# Patient Record
Sex: Female | Born: 1960
Health system: Southern US, Community
[De-identification: ages and names within clinical notes are randomized; demographics above are authoritative.]

## PROBLEM LIST (undated history)

## (undated) DIAGNOSIS — C801 Malignant (primary) neoplasm, unspecified: Secondary | ICD-10-CM

## (undated) DIAGNOSIS — I499 Cardiac arrhythmia, unspecified: Secondary | ICD-10-CM

## (undated) DIAGNOSIS — F32A Depression, unspecified: Secondary | ICD-10-CM

## (undated) DIAGNOSIS — K219 Gastro-esophageal reflux disease without esophagitis: Secondary | ICD-10-CM

## (undated) DIAGNOSIS — F329 Major depressive disorder, single episode, unspecified: Secondary | ICD-10-CM

## (undated) DIAGNOSIS — F4310 Post-traumatic stress disorder, unspecified: Secondary | ICD-10-CM

## (undated) DIAGNOSIS — I10 Essential (primary) hypertension: Secondary | ICD-10-CM

## (undated) DIAGNOSIS — M810 Age-related osteoporosis without current pathological fracture: Secondary | ICD-10-CM

## (undated) DIAGNOSIS — M1990 Unspecified osteoarthritis, unspecified site: Secondary | ICD-10-CM

## (undated) DIAGNOSIS — J439 Emphysema, unspecified: Secondary | ICD-10-CM

## (undated) DIAGNOSIS — F1020 Alcohol dependence, uncomplicated: Secondary | ICD-10-CM

## (undated) DIAGNOSIS — M419 Scoliosis, unspecified: Secondary | ICD-10-CM

## (undated) DIAGNOSIS — F419 Anxiety disorder, unspecified: Secondary | ICD-10-CM

## (undated) DIAGNOSIS — E119 Type 2 diabetes mellitus without complications: Secondary | ICD-10-CM

## (undated) DIAGNOSIS — G4730 Sleep apnea, unspecified: Secondary | ICD-10-CM

## (undated) DIAGNOSIS — R32 Unspecified urinary incontinence: Secondary | ICD-10-CM

## (undated) HISTORY — PX: OTHER SURGICAL HISTORY: SHX169

## (undated) HISTORY — DX: Essential (primary) hypertension: I10

## (undated) HISTORY — DX: Unspecified urinary incontinence: R32

## (undated) HISTORY — DX: Cardiac arrhythmia, unspecified: I49.9

## (undated) HISTORY — DX: Alcohol dependence, uncomplicated: F10.20

## (undated) HISTORY — PX: ABDOMINAL HYSTERECTOMY: SHX81

## (undated) HISTORY — PX: HEMORRHOID SURGERY: SHX153

## (undated) HISTORY — DX: Age-related osteoporosis without current pathological fracture: M81.0

## (undated) HISTORY — PX: COLONOSCOPY: SHX174

## (undated) HISTORY — DX: Type 2 diabetes mellitus without complications: E11.9

## (undated) HISTORY — DX: Sleep apnea, unspecified: G47.30

## (undated) HISTORY — DX: Major depressive disorder, single episode, unspecified: F32.9

## (undated) HISTORY — DX: Anxiety disorder, unspecified: F41.9

## (undated) HISTORY — DX: Unspecified osteoarthritis, unspecified site: M19.90

## (undated) HISTORY — DX: Depression, unspecified: F32.A

## (undated) HISTORY — DX: Gastro-esophageal reflux disease without esophagitis: K21.9

## (undated) HISTORY — PX: TOTAL ABDOMINAL HYSTERECTOMY: SHX209

## (undated) HISTORY — DX: Post-traumatic stress disorder, unspecified: F43.10

## (undated) HISTORY — PX: COLPOSCOPY: SHX161

## (undated) HISTORY — PX: CERVICAL BIOPSY  W/ LOOP ELECTRODE EXCISION: SUR135

## (undated) HISTORY — DX: Malignant (primary) neoplasm, unspecified: C80.1

## (undated) HISTORY — PX: FINGER SURGERY: SHX640

---

## 2014-09-10 ENCOUNTER — Other Ambulatory Visit (INDEPENDENT_AMBULATORY_CARE_PROVIDER_SITE_OTHER): Payer: Self-pay | Admitting: General Surgery

## 2015-05-25 ENCOUNTER — Encounter: Payer: Self-pay | Admitting: Family Medicine

## 2015-05-25 ENCOUNTER — Ambulatory Visit (INDEPENDENT_AMBULATORY_CARE_PROVIDER_SITE_OTHER): Payer: BLUE CROSS/BLUE SHIELD | Admitting: Physician Assistant

## 2015-05-25 ENCOUNTER — Encounter: Payer: Self-pay | Admitting: Physician Assistant

## 2015-05-25 VITALS — BP 128/78 | HR 84 | Temp 98.8°F | Resp 18 | Ht 62.0 in | Wt 142.0 lb

## 2015-05-25 DIAGNOSIS — Z23 Encounter for immunization: Secondary | ICD-10-CM

## 2015-05-25 DIAGNOSIS — F419 Anxiety disorder, unspecified: Secondary | ICD-10-CM

## 2015-05-25 DIAGNOSIS — I10 Essential (primary) hypertension: Secondary | ICD-10-CM | POA: Diagnosis not present

## 2015-05-25 DIAGNOSIS — G4700 Insomnia, unspecified: Secondary | ICD-10-CM | POA: Diagnosis not present

## 2015-05-25 DIAGNOSIS — F418 Other specified anxiety disorders: Secondary | ICD-10-CM | POA: Insufficient documentation

## 2015-05-25 DIAGNOSIS — F329 Major depressive disorder, single episode, unspecified: Secondary | ICD-10-CM | POA: Insufficient documentation

## 2015-05-25 DIAGNOSIS — F32A Depression, unspecified: Secondary | ICD-10-CM

## 2015-05-25 MED ORDER — METOPROLOL SUCCINATE ER 50 MG PO TB24: 50.0000 mg | ORAL_TABLET | Freq: Every day | ORAL | Status: DC

## 2015-05-25 MED ORDER — CLONAZEPAM 0.5 MG PO TABS
ORAL_TABLET | ORAL | Status: DC
Start: 2015-05-25 — End: 2015-06-29

## 2015-05-25 MED ORDER — VENLAFAXINE HCL ER 75 MG PO CP24: 75.0000 mg | ORAL_CAPSULE | Freq: Every day | ORAL | Status: DC

## 2015-05-25 NOTE — Progress Notes (Signed)
Patient ID: Regina Richardson MRN: [id number], DOB: 11/18/1960, 54 y.o. Date of Encounter: @DATE @  Chief Complaint:  Chief Complaint  Patient presents with  . new pt esrt care  CPE    needs meds refilled  anxiety  s/p allergic reaction seen at [facility]    HPI: 54 y.o. year old white female  presents as a new patient to establish care.  She says that she just moved here from [place]. Had been living up in [place] for about 15 years. She just recently got married right around the time that they moved here. She says that she was originally from [facility] [place] but her husband was from [place] so they are living in this area.  She says that she has been on venlafaxine for about 15 years. Says that prior to the venlafaxine, she was initially prescribed Prozac but said that she "just didn't feel right" when on Prozac.  Says that she was taking her Klonopin 1 in the morning 1 in the evening and then 2 at bedtime.  Says that she recently went to the [facility] urgent care because she was having an allergic reaction. Says that when she was there for that visit they stopped her lisinopril and decreased her Klonopin down to just 90 per month.  She says that she thinks her depression has been worse since moving and says that her anxiety is also worse. Says that last week and then again on this past Saturday she had episodes of bad anxiety/panic attack. Felt sweaty and clammy chest discomfort shortness of breath and panicky.  Says that when they were living in [place] her husband had a different job and he was around on a daily basis. Says now he travels a lot with his job so she is alone. Also says that now she is in a new place where she doesn't know where anything is and doesn't know anyone. Also says that she did a lot of traveling back and forth from [place] during the move driving 10 hours there 10 hours back etc. Says that she thinks that just wore her out and got her  offkilter. Also she says that she has a new job at [employer]. She is used to having a set schedule but that with this job she works different hours on different days and says that every morning she has to wake up to look at the calendar to see if she is going to work him what hours if so.  Says that she hasn't gotten sleep for the past week since the Klonopin was decreased she has run out and doesn't have enough to take at nighttime.  Says that in the past when in [place] they had given her trazodone at one point. Is that did not agree with her and caused her to have crazy dreams etc.  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 Says that her husband was a Software engineer over [employer] and is currently working with a Counsellor.  Discussed doing a complete physical exam today but she is not sure that it has been a full one year since her last CPE so we will hold off on doing that.   Past Medical History  Diagnosis Date  . Anxiety   . Depression      Home Meds: Outpatient Prescriptions Prior to Visit  Medication Sig Dispense Refill  . clonazePAM (KLONOPIN)  0.5 MG tablet Take 0.5 mg by mouth 3 (three) times daily as needed for anxiety.    . metoprolol succinate (TOPROL-XL) 50 MG 24 hr tablet Take 50 mg by mouth daily. Take with or immediately following a meal.    . venlafaxine XR (EFFEXOR-XR) 37.5 MG 24 hr capsule Take 37.5 mg by mouth daily with breakfast.     No facility-administered medications prior to visit.    Allergies: No Known Allergies  Social History   Social History  . Marital Status: Single    Spouse Name: N/A  . Number of Children: N/A  . Years of Education: N/A   Occupational History  . Not on file.   Social History Main Topics  . Smoking status: Current Every Day Smoker -- 0.50 packs/day    Types: Cigarettes  .  Smokeless tobacco: Never Used  . Alcohol Use: 3.6 oz/week    6 Cans of beer per week  . Drug Use: No  . Sexual Activity: Not Currently   Other Topics Concern  . Not on file   Social History Narrative    Family History  Problem Relation Age of Onset  . Family history unknown: Yes     Review of Systems:  See HPI for pertinent ROS. All other ROS negative.    Physical Exam: Blood pressure 128/78, pulse 84, temperature 98.8 F (37.1 C), temperature source Oral, resp. rate 18, height 5\' 2"  (1.575 m), weight 142 lb (64.411 kg)., Body mass index is 25.97 kg/(m^2). General: WNWD WF. Appears in no acute distress. Neck: Supple. No thyromegaly. No lymphadenopathy. No carotid bruits. Lungs: Clear bilaterally to auscultation without wheezes, rales, or rhonchi. Breathing is unlabored. Heart: RRR with S1 S2. No murmurs, rubs, or gallops. Musculoskeletal:  Strength and tone normal for age. Extremities/Skin: Warm and dry. Neuro: Alert and oriented X 3. Moves all extremities spontaneously. Gait is normal. CNII-XII grossly in tact. Psych:  Responds to questions appropriately with a normal affect.     ASSESSMENT AND PLAN:  54 y.o. year old female with  1. Essential hypertension Blood Pressure at goal/controlled today. Stay off of lisinopril. Continue Toprol-XL 50 mg daily. - metoprolol succinate (TOPROL-XL) 50 MG 24 hr tablet; Take 1 tablet (50 mg total) by mouth daily.  Dispense: 30 tablet; Refill: 3  2. Anxiety Her anxiety is not well controlled. Will increase dose of Effexor from 37.5 mg to 75 mg daily. Will go ahead and continue her Klonopin at the dose that she had been taking for now until her anxiety improves. She will have follow-up office visit in 6 weeks and at that time hopefully the increased dose of Effexor will be taking effect and decreasing her anxiety to where we can decrease the use of her Klonopin. - venlafaxine XR (EFFEXOR XR) 75 MG 24 hr capsule; Take 1 capsule (75 mg  total) by mouth daily with breakfast.  Dispense: 30 capsule; Refill: 2 - clonazePAM (KLONOPIN) 0.5 MG tablet; Take 1 in morning, Take 1 in afternoon, Take 2 at bedtime  Dispense: 120 tablet; Refill: 0  3. Depression Will increase the dose of Effexor to better control this. - venlafaxine XR (EFFEXOR XR) 75 MG 24 hr capsule; Take 1 capsule (75 mg total) by mouth daily with breakfast.  Dispense: 30 capsule; Refill: 2  4. Insomnia Will increase the dose of Effexor to better control this. While we wait for this to take effect, she can continue to take 2 clonazepam's at bedtime to control the insomnia. However hopefully  at her follow-up visit in 6 weeks all of these symptoms will improve to where we can decrease the clonazepam dosing at that point. - venlafaxine XR (EFFEXOR XR) 75 MG 24 hr capsule; Take 1 capsule (75 mg total) by mouth daily with breakfast.  Dispense: 30 capsule; Refill: 2 - clonazePAM (KLONOPIN) 0.5 MG tablet; Take 1 in morning, Take 1 in afternoon, Take 2 at bedtime  Dispense: 120 tablet; Refill: 0  5. Need for prophylactic vaccination and inoculation against influenza She has not had influenza vaccine this season and is agreeable to receive this today. - Flu Vaccine QUAD 36+ mos IM  She is going to check to see the date of her last complete physical exam.  Will reschedule for complete physical exam once we know that it is a full 12 months between physicals secondary to insurance coverage.  Follow-up office visit in 6 weeks. Follow-up sooner if needed.  Marin Olp [place], [place], [facility] 05/25/2015 2:39 PM

## 2015-06-29 ENCOUNTER — Encounter: Payer: Self-pay | Admitting: Physician Assistant

## 2015-06-29 ENCOUNTER — Ambulatory Visit (INDEPENDENT_AMBULATORY_CARE_PROVIDER_SITE_OTHER): Payer: BLUE CROSS/BLUE SHIELD | Admitting: Physician Assistant

## 2015-06-29 VITALS — BP 146/80 | HR 78 | Temp 98.9°F | Resp 18 | Wt 150.0 lb

## 2015-06-29 DIAGNOSIS — F329 Major depressive disorder, single episode, unspecified: Secondary | ICD-10-CM

## 2015-06-29 DIAGNOSIS — F419 Anxiety disorder, unspecified: Secondary | ICD-10-CM

## 2015-06-29 DIAGNOSIS — I10 Essential (primary) hypertension: Secondary | ICD-10-CM | POA: Diagnosis not present

## 2015-06-29 DIAGNOSIS — G4700 Insomnia, unspecified: Secondary | ICD-10-CM

## 2015-06-29 DIAGNOSIS — F32A Depression, unspecified: Secondary | ICD-10-CM

## 2015-06-29 MED ORDER — CLONAZEPAM 0.5 MG PO TABS: ORAL_TABLET | ORAL | Status: DC

## 2015-06-29 MED ORDER — VENLAFAXINE HCL ER 150 MG PO CP24: 150.0000 mg | ORAL_CAPSULE | Freq: Every day | ORAL | Status: DC

## 2015-06-29 NOTE — Progress Notes (Addendum)
Patient ID: Regina Richardson MRN: [id number], DOB: [birth year], 54 y.o. Date of Encounter: @DATE @  Chief Complaint:  Chief Complaint  Patient presents with  . 6 week follow up    HPI: 54 y.o. year old white female  Presents for f/u of Depresison, Insomnia.    05/25/2015 OV NOTE: She presents as a new patient to establish care.  She says that she just moved here from [place]. Had been living up in [place] for about 15 years. She just recently got married right around the time that they moved here. She says that she was originally from [facility] but her husband was from [place] so they are living in this area.  She says that she has been on venlafaxine for about 15 years. Says that prior to the venlafaxine, she was initially prescribed Prozac but said that she "just didn't feel right" when on Prozac.  Says that she was taking her Klonopin 1 in the morning 1 in the evening and then 2 at bedtime.  Says that she recently went to the [facility] urgent care because she was having an allergic reaction. Says that when she was there for that visit they stopped her lisinopril and decreased her Klonopin down to just 90 per month.  She says that she thinks her depression has been worse since moving and says that her anxiety is also worse. Says that last week and then again on this past Saturday she had episodes of bad anxiety/panic attack. Felt sweaty and clammy chest discomfort shortness of breath and panicky.  Says that when they were living in [place] her husband had a different job and he was around on a daily basis. Says now he travels a lot with his job so she is alone. Also says that now she is in a new place where she doesn't know where anything is and doesn't know anyone. Also says that she did a lot of traveling back and forth from [place] during the move driving 10 hours there 10 hours back etc. Says that she thinks that just wore her out and got her  offkilter. Also she says that she has a new job at [employer]. She is used to having a set schedule but that with this job she works different hours on different days and says that every morning she has to wake up to look at the calendar to see if she is going to work him what hours if so.  Says that she hasn't gotten sleep for the past week since the Klonopin was decreased she has run out and doesn't have enough to take at nighttime.  Says that in the past when in [place] they had given her trazodone at one point. Is that did not agree with her and caused her to have crazy dreams etc.  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 Says that her husband was a Software engineer over [employer] and is currently working with a Counsellor.  Discussed doing a complete physical exam today but she is not sure that it has been a full one year since her last CPE so we will hold off on doing that.   At that OV, her Blood Pressure was at goal/controlled. Told to stay off of lisinopril. Continue Toprol-XL 50 mg daily. At that [place], her anxiety and depression and insomnia  were not well controlled. Increased dose of Effexor  from 37.5 mg to 75 mg daily. Continued her Klonopin at the dose that she had been taking for now until her anxiety improves. Planned follow-up office visit in 6 weeks .  She planned to check to see the date of her last complete physical exam.  Will reschedule for complete physical exam once we know that it is a full 12 months between physicals secondary to insurance coverage.    TODAY---06/29/2015:  Today she basically is reporting the exact same symptoms that she reported last visit. Really does not seem that there has been any improvement at all. She even says that she is "still not sleeping, still depressed ". As she talks throughout the visit  today, she continuously is wiping tears from her eyes with the tissue. Says that "he is gone all the time---  in [place] then home for 2 days and then going to [place]" Also says that she "has a job that sucks". Says that prior to moving, she had a job that paid well at [employer]. 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 Also says that the main reason they moved here was to be closer to his parents because he realized they were getting older and needing assistance. Says that, meanwhile, her husband is gone all the time and she is the one left to help them. Also says that they just show up at her door any time, unannounced. Says that she will be in her pajamas, ready for bed and they will just show up at the door. Also says that she does not know where she is and does not know anything around here. She "gets lost if she gets off of [employer] . Says that recently she had a bad reaction and had bumps all over her body and had to get her neighbor who she would only met once to help her get to urgent care. Says that at night she will sleep for an hour or 2 then wake up with staff running through her head. Will take to Klonopin and that will help her get to sleep. Denies any homicidal or suicidal ideation.   Past Medical History  Diagnosis Date  . Anxiety   . Depression      Home Meds: Outpatient Prescriptions Prior to Visit  Medication Sig Dispense Refill  . metoprolol succinate (TOPROL-XL) 50 MG 24 hr tablet Take 1 tablet (50 mg total) by mouth daily. 30 tablet 3  . clonazePAM (KLONOPIN) 0.5 MG tablet Take 1 in morning, Take 1 in afternoon, Take 2 at bedtime 120 tablet 0  . venlafaxine XR (EFFEXOR XR) 75 MG 24 hr capsule Take 1 capsule (75 mg total) by mouth daily with breakfast. 30 capsule 2   No facility-administered medications prior to  visit.    Allergies: No Known Allergies  Social History   Social History  . Marital Status: Single    Spouse Name: N/A  . Number of Children: N/A  . Years of Education: N/A   Occupational History  . Not on file.   Social History Main Topics  . Smoking status: Current Every Day Smoker -- 0.50 packs/day    Types: Cigarettes  . Smokeless tobacco: Never Used  . Alcohol Use: 3.6 oz/week    6 Cans of beer per week  . Drug Use: No  .  Sexual Activity: Not Currently   Other Topics Concern  . Not on file   Social History Narrative    Family History  Problem Relation Age of Onset  . Family history unknown: Yes     Review of Systems:  See HPI for pertinent ROS. All other ROS negative.    Physical Exam: Blood pressure 146/80, pulse 78, temperature 98.9 F (37.2 C), temperature source Oral, resp. rate 18, weight 150 lb (68.04 kg)., Body mass index is 27.43 kg/(m^2). General: WNWD WF. Appears in no acute distress. Neck: Supple. No thyromegaly. No lymphadenopathy. No carotid bruits. Lungs: Clear bilaterally to auscultation without wheezes, rales, or rhonchi. Breathing is unlabored. Heart: RRR with S1 S2. No murmurs, rubs, or gallops. Musculoskeletal:  Strength and tone normal for age. Extremities/Skin: Warm and dry. Neuro: Alert and oriented X 3. Moves all extremities spontaneously. Gait is normal. CNII-XII grossly in tact. Psych:  Responds to questions appropriately with a normal affect.     ASSESSMENT AND PLAN:  54 y.o. year old female with   1. Anxiety Note: In past trazodone caused "crazy dreams" and "did not agree with her " ------- in past Prozac caused her to "just did not feel right "  Will further increase Effexor dose to 150 mg daily. Schedule follow-up visit in 6 weeks. Follow-up sooner if needed. - venlafaxine XR (EFFEXOR XR) 150 MG 24 hr capsule; Take 1 capsule (150 mg total) by mouth daily with breakfast.  Dispense: 30 capsule; Refill: 1 - clonazePAM  (KLONOPIN) 0.5 MG tablet; Take 1 in morning, Take 1 in afternoon, Take 2 at bedtime  Dispense: 120 tablet; Refill: 0  2. Depression Note: In past trazodone caused "crazy dreams" and "did not agree with her " ------- in past Prozac caused her to "just did not feel right "  Will further increase Effexor dose to 150 mg daily. Schedule follow-up visit in 6 weeks. Follow-up sooner if needed.  - venlafaxine XR (EFFEXOR XR) 150 MG 24 hr capsule; Take 1 capsule (150 mg total) by mouth daily with breakfast.  Dispense: 30 capsule; Refill: 1  3. Essential hypertension Will continue current blood pressure medicine for now.  4. Insomnia Will increase the dose of Effexor. Hopefully, insomnia will improve with this increased dose. Continue using clonazepam as needed to control insomnia in the meantime.  Will plan follow-up office visit 6 weeks or sooner if needed.      [address] [place], [place], [facility] 06/29/2015 2:39 PM

## 2015-09-05 ENCOUNTER — Other Ambulatory Visit: Payer: Self-pay | Admitting: Physician Assistant

## 2015-09-05 NOTE — Telephone Encounter (Signed)
Medication refilled per protocol. 

## 2015-09-07 ENCOUNTER — Encounter: Payer: Self-pay | Admitting: Physician Assistant

## 2015-09-07 ENCOUNTER — Ambulatory Visit (INDEPENDENT_AMBULATORY_CARE_PROVIDER_SITE_OTHER): Payer: BLUE CROSS/BLUE SHIELD | Admitting: Physician Assistant

## 2015-09-07 VITALS — BP 126/80 | HR 76 | Temp 98.6°F | Resp 18 | Ht 62.0 in | Wt 153.0 lb

## 2015-09-07 DIAGNOSIS — I10 Essential (primary) hypertension: Secondary | ICD-10-CM | POA: Diagnosis not present

## 2015-09-07 DIAGNOSIS — Z0000 Encounter for general adult medical examination without abnormal findings: Secondary | ICD-10-CM | POA: Diagnosis not present

## 2015-09-07 DIAGNOSIS — F419 Anxiety disorder, unspecified: Secondary | ICD-10-CM

## 2015-09-07 DIAGNOSIS — F17200 Nicotine dependence, unspecified, uncomplicated: Secondary | ICD-10-CM

## 2015-09-07 DIAGNOSIS — G4700 Insomnia, unspecified: Secondary | ICD-10-CM

## 2015-09-07 DIAGNOSIS — F329 Major depressive disorder, single episode, unspecified: Secondary | ICD-10-CM

## 2015-09-07 DIAGNOSIS — Z720 Tobacco use: Secondary | ICD-10-CM | POA: Diagnosis not present

## 2015-09-07 DIAGNOSIS — Z23 Encounter for immunization: Secondary | ICD-10-CM

## 2015-09-07 DIAGNOSIS — F32A Depression, unspecified: Secondary | ICD-10-CM

## 2015-09-07 LAB — CBC WITH DIFFERENTIAL/PLATELET
Basophils Absolute: 0.1 10*3/uL (ref 0.0–0.1)
Basophils Relative: 1 % (ref 0–1)
Eosinophils Absolute: 0.2 10*3/uL (ref 0.0–0.7)
Eosinophils Relative: 2 % (ref 0–5)
HCT: 39 % (ref 36.0–46.0)
Hemoglobin: 12.8 g/dL (ref 12.0–15.0)
Lymphocytes Relative: 25 % (ref 12–46)
Lymphs Abs: 2.8 10*3/uL (ref 0.7–4.0)
MCH: 31.1 pg (ref 26.0–34.0)
MCHC: 32.8 g/dL (ref 30.0–36.0)
MCV: 94.7 fL (ref 78.0–100.0)
MPV: 8.6 fL (ref 8.6–12.4)
Monocytes Absolute: 0.9 10*3/uL (ref 0.1–1.0)
Monocytes Relative: 8 % (ref 3–12)
Neutro Abs: 7.2 10*3/uL (ref 1.7–7.7)
Neutrophils Relative %: 64 % (ref 43–77)
Platelets: 372 10*3/uL (ref 150–400)
RBC: 4.12 MIL/uL (ref 3.87–5.11)
RDW: 13.9 % (ref 11.5–15.5)
WBC: 11.3 10*3/uL — ABNORMAL HIGH (ref 4.0–10.5)

## 2015-09-07 LAB — TSH: TSH: 1.15 mIU/L

## 2015-09-07 MED ORDER — BUPROPION HCL ER (SR) 150 MG PO TB12: ORAL_TABLET | ORAL | Status: DC

## 2015-09-07 MED ORDER — CLONAZEPAM 0.5 MG PO TABS: ORAL_TABLET | ORAL | Status: DC

## 2015-09-07 NOTE — Progress Notes (Signed)
  Patient ID: Regina Richardson MRN: [id number], DOB: 08/07/1960, 54 y.o. Date of Encounter: 09/07/2015,   Chief Complaint: Physical (CPE)  HPI: 54 y.o. y/o female  here for CPE.    05/25/2015 OV NOTE: She presents as a new patient to establish care.  She says that she just moved here from [place]. Had been living up in [place] for about 15 years. She just recently got married right around the time that they moved here. She says that she was originally from [place] but her husband was from [place] so they are living in this area.  She says that she has been on venlafaxine for about 15 years. Says that prior to the venlafaxine, she was initially prescribed Prozac but said that she "just didn't feel right" when on Prozac.  Says that she was taking her Klonopin 1 in the morning 1 in the evening and then 2 at bedtime.  Says that she recently went to the [facility] urgent care because she was having an allergic reaction. Says that when she was there for that visit they stopped her lisinopril and decreased her Klonopin down to just 90 per month.  She says that she thinks her depression has been worse since moving and says that her anxiety is also worse. Says that last week and then again on this past Saturday she had episodes of bad anxiety/panic attack. Felt sweaty and clammy chest discomfort shortness of breath and panicky.  Says that when they were living in [place] her husband had a different job and he was around on a daily basis. Says now he travels a lot with his job so she is alone. Also says that now she is in a new place where she doesn't know where anything is and doesn't know anyone. Also says that she did a lot of traveling back and forth from [place] during the move driving 10 hours there 10 hours back etc. Says that she thinks that just wore her out and got her offkilter. Also she says that she has a new job at [employer]. She is used to having a  set schedule but that with this job she works different hours on different days and says that every morning she has to wake up to look at the calendar to see if she is going to work him what hours if so.  Says that she hasn't gotten sleep for the past week since the Klonopin was decreased she has run out and doesn't have enough to take at nighttime.  Says that in the past when in [place] they had given her trazodone at one point. Is that did not agree with her and caused her to have crazy dreams etc.  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 Says that her husband was a president over pharmaceutical company and is currently working with a pharmaceutical company.  Discussed doing a complete physical exam today but she is not sure that it has been a full one year since her last CPE so we will hold off on doing that.   At that OV, her Blood Pressure was at goal/controlled. Told to stay off of lisinopril. Continue Toprol-XL 50 mg daily. At that OV, her anxiety and depression and insomnia were not well controlled. Increased dose of Effexor from 37.5 mg to 75 mg daily. Continued her Klonopin at the dose that she had been taking   for now until her anxiety improves. Planned follow-up office visit in 6 weeks .  She planned to check to see the date of her last complete physical exam.  Will reschedule for complete physical exam once we know that it is a full 12 months between physicals secondary to insurance coverage.    OV---06/29/2015:  Today she basically is reporting the exact same symptoms that she reported last visit. Really does not seem that there has been any improvement at all. She even says that she is "still not sleeping, still depressed ". As she talks throughout the visit today, she continuously is wiping tears from her eyes with the tissue. Says that "he is  gone all the time--- in [place] then home for 2 days and then going to [place]" Also says that she "has a job that sucks". Says that prior to moving, she had a job that paid well at [employer]. 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 Also says that the main reason they moved here was to be closer to his parents because he realized they were getting older and needing assistance. Says that, meanwhile, her husband is gone all the time and she is the one left to help them. Also says that they just show up at her door any time, unannounced. Says that she will be in her pajamas, ready for bed and they will just show up at the door. Also says that she does not know where she is and does not know anything around here. She "gets lost if she gets off of [employer] . Says that recently she had a bad reaction and had bumps all over her body and had to get her neighbor who she would only met once to help her get to urgent care. Says that at night she will sleep for an hour or 2 then wake up with staff running through her head. Will take to Klonopin and that will help her get to sleep. Denies any homicidal or suicidal ideation.  At that OV 06/29/2015---Increased dose of Effexor to 147m QD.   At OV/ CPE 09/07/2015: She presents today for complete physical exam. Asked if she is feeling any improvement in her symptoms since increasing the dose of Effexor. She says that she really does not feel much better and really doesn't see much difference. Says that she has gotten herself a cat and thinks that is helping a little bit. Medication is causing no adverse effects. She has no other complaints or concerns.  Review of Systems: Consitutional: No fever, chills, fatigue, night sweats, lymphadenopathy. No significant/unexplained weight changes. Eyes: No visual  changes, eye redness, or discharge. ENT/Mouth: No ear pain, sore throat, nasal drainage, or sinus pain. Cardiovascular: No chest pressure,heaviness, tightness or squeezing, even with exertion. No increased shortness of breath or dyspnea on exertion.No palpitations, edema, orthopnea, PND. Respiratory: No cough, hemoptysis, SOB, or wheezing. Gastrointestinal: No anorexia, dysphagia, reflux, pain, nausea, vomiting, hematemesis, diarrhea, constipation, BRBPR, or melena. Breast: No mass, nodules, bulging, or retraction. No skin changes or inflammation. No nipple discharge. No lymphadenopathy. Genitourinary: No dysuria, hematuria, incontinence, vaginal discharge, pruritis, burning, abnormal bleeding, or pain. Musculoskeletal: No decreased ROM, No joint pain or swelling. No significant pain in neck, back, or extremities. Skin: No rash, pruritis, or concerning lesions. Neurological: No headache, dizziness, syncope, seizures,  tremors, memory loss, coordination problems, or paresthesias. Psychological: See HPI. No hallucinations, SI/HI. Endocrine: No polydipsia, polyphagia, polyuria, or known diabetes.No increased fatigue. No palpitations/rapid heart rate. No significant/unexplained weight change. All other systems were reviewed and are otherwise negative.  Past Medical History  Diagnosis Date  . Anxiety   . Depression      Past Surgical History  Procedure Laterality Date  . Abdominal hysterectomy      still has ovaries    Home Meds:  Outpatient Prescriptions Prior to Visit  Medication Sig Dispense Refill  . metoprolol succinate (TOPROL-XL) 50 MG 24 hr tablet Take 1 tablet (50 mg total) by mouth daily. 30 tablet 3  . venlafaxine XR (EFFEXOR-XR) 150 MG 24 hr capsule TAKE 1 CAPSULE (150 MG TOTAL) BY MOUTH DAILY WITH BREAKFAST. 30 capsule 0  . clonazePAM (KLONOPIN) 0.5 MG tablet Take 1 in morning, Take 1 in afternoon, Take 2 at bedtime 120 tablet 0   No facility-administered medications prior to  visit.    Allergies: No Known Allergies  Social History   Social History  . Marital Status: Single    Spouse Name: N/A  . Number of Children: N/A  . Years of Education: N/A   Occupational History  . Not on file.   Social History Main Topics  . Smoking status: Current Every Day Smoker -- 0.50 packs/day    Types: Cigarettes  . Smokeless tobacco: Never Used  . Alcohol Use: 3.6 oz/week    6 Cans of beer per week  . Drug Use: No  . Sexual Activity: Not Currently   Other Topics Concern  . Not on file   Social History Narrative    Family History  Problem Relation Age of Onset  . Family history unknown: Yes    Physical Exam: Blood pressure 126/80, pulse 76, temperature 98.6 F (37 C), temperature source Oral, resp. rate 18, height 5' 2" (1.575 m), weight 153 lb (69.4 kg)., Body mass index is 27.98 kg/(m^2). General: Well developed, well nourished, WF. Appears in no acute distress. HEENT: Normocephalic, atraumatic. Conjunctiva pink, sclera non-icteric. Pupils 2 mm constricting to 1 mm, round, regular, and equally reactive to light and accomodation. EOMI. Internal auditory canal clear. TMs with good cone of light and without pathology. Nasal mucosa pink. Nares are without discharge. No sinus tenderness. Oral mucosa pink.  Pharynx without exudate.   Neck: Supple. Trachea midline. No thyromegaly. Full ROM. No lymphadenopathy.No Carotid Bruits. Lungs: Clear to auscultation bilaterally without wheezes, rales, or rhonchi. Breathing is of normal effort and unlabored. Cardiovascular: RRR with S1 S2. No murmurs, rubs, or gallops. Distal pulses 2+ symmetrically. No carotid or abdominal bruits. Breast: Symmetrical. No masses. Nipples without discharge. Abdomen: Soft, non-tender, non-distended with normoactive bowel sounds. No hepatosplenomegaly or masses. No rebound/guarding. No CVA tenderness. No hernias.  Genitourinary:  External genitalia without lesions. Vaginal mucosa pink.No discharge  present.  She has had hysterectomy. Exam c/w this. No masses.   Musculoskeletal: Full range of motion and 5/5 strength throughout.  Skin: Warm and moist without erythema, ecchymosis, wounds, or rash. Neuro: A+Ox3. CN II-XII grossly intact. Moves all extremities spontaneously. Full sensation throughout. Normal gait. DTR 2+ throughout upper and lower extremities.  Psych:  Responds to questions appropriately with a normal affect.   Assessment/Plan:  54 y.o. y/o female here for CPE  1. Visit for preventive health examination  A. Screening Labs: She is fasting. Check labs now. - CBC with Differential/Platelet - COMPLETE METABOLIC PANEL WITH GFR - Lipid panel -   TSH - VITAMIN D 25 Hydroxy (Vit-D Deficiency, Fractures)   B. Pap: She reports that she had hysterectomy and this was NOT performed secondary to cancer. Therefore does not need Pap smear. Pelvic exam normal.  C. Screening Mammogram: She reports that [place] mamm[place] greater [place] She is agreeable for me to go ahead and schedule mammogram locally.  - MM Digital Screening; Future   D. DEXA/BMD:  At future visit, will discuss age of hysterectomy and whether ovaries were removed and whether she has been on any HRT to determine age that we need to start screening for osteoporosis with  bone density scanning.  E. Colorectal Cancer Screening: She reports that she has had one colonoscopy. She does not know her exact age or the exact date when she had it, but says it was done prior to age 49.  Says it was normal except for some hemorrhoids and was informed to repeat 10 years. She thinks that it is due in about 2 years.  F. Immunizations:  Influenza: She received here 05/25/2015 Tetanus: He thinks that she has received a tetanus vaccine in the past 10 years. I have records to know exact date. Pneumococcal: She reports that she has not had any pneumonia vaccine. She is a smoker. Discussed that she needs  Pneumovax 23 and she is agreeable to receive this today.               Pneumovax 23---Given here 09/07/2015    Prevnar 13.-------Give at age 64 Zostavax:---------------------Give at age 59  2. Anxiety Note: In past trazodone caused "crazy dreams" and "did not agree with her " ------- in past Prozac caused her to "just did not feel right " She is to continue current dose of Effexor 150 mg daily. Will add Wellbutrin to this. She is to take 1 daily for 5 days then increase to 1 twice daily. If this causes any adverse effects she is to call us immediately. Otherwise continue taking until follow-up appointment in 6-7 weeks. Discussed proper expectations of medication with patient. She states that she is out of her Klonopin and is needing refill. Reviewed her last prescription was given 06/29/15 for #120+0. Refill printed for her today. - buPROPion (WELLBUTRIN SR) 150 MG 12 hr tablet; Take 1 daily for 5 days then increase to 1 twice a day.  Dispense: 60 tablet; Refill: 2 - clonazePAM (KLONOPIN) 0.5 MG tablet; Take 1 in morning, Take 1 in afternoon, Take 2 at bedtime  Dispense: 120 tablet; Refill: 0  3. Depression Note: In past trazodone caused "crazy dreams" and "did not agree with her " ------- in past Prozac caused her to "just did not feel right " She is to continue current dose of Effexor 150 mg daily. Will add Wellbutrin to this. She is to take 1 daily for 5 days then increase to 1 twice daily. If this causes any adverse effects she is to call us immediately. Otherwise continue taking until follow-up appointment in 6-7 weeks. Discussed proper expectations of medication with patient. She states that she is out of her Klonopin and is needing refill. Reviewed her last prescription was given 06/29/15 for #120+0. Refill printed for her today. - buPROPion (WELLBUTRIN SR) 150 MG 12 hr tablet; Take 1 daily for 5 days then increase to 1 twice a day.  Dispense: 60 tablet; Refill: 2 - clonazePAM (KLONOPIN) 0.5  MG tablet; Take 1 in morning, Take 1 in afternoon, Take 2 at bedtime    Dispense: 120 tablet; Refill: 0  - buPROPion (WELLBUTRIN SR) 150 MG 12 hr tablet; Take 1 daily for 5 days then increase to 1 twice a day.  Dispense: 60 tablet; Refill: 2  4. Essential hypertension Blood Pressure is controlled/at goal. Continue current medications. Check labs to monitor.  5. Insomnia - clonazePAM (KLONOPIN) 0.5 MG tablet; Take 1 in morning, Take 1 in afternoon, Take 2 at bedtime  Dispense: 120 tablet; Refill: 0  6. Smoker Hopefully the Wellbutrin will help with smoking cessation and addition to her anxiety and depression symptoms. - buPROPion (WELLBUTRIN SR) 150 MG 12 hr tablet; Take 1 daily for 5 days then increase to 1 twice a day.  Dispense: 60 tablet; Refill: 2  7. Need for prophylactic vaccination against Streptococcus pneumoniae (pneumococcus)    Will plan follow-up office visit 6 -7 weeks or sooner if needed.   Signed, [address] [place], [place], [facility] 09/07/2015 11:11 AM

## 2015-09-08 LAB — LIPID PANEL
Cholesterol: 189 mg/dL (ref 125–200)
HDL: 60 mg/dL (ref >=46)
LDL Cholesterol: 113 mg/dL (ref <130)
Total CHOL/HDL Ratio: 3.2 Ratio (ref <=5.0)
Triglycerides: 79 mg/dL (ref <150)
VLDL: 16 mg/dL (ref <30)

## 2015-09-08 LAB — COMPLETE METABOLIC PANEL WITH GFR
ALT: 31 U/L — ABNORMAL HIGH (ref 6–29)
AST: 32 U/L (ref 10–35)
Albumin: 4 g/dL (ref 3.6–5.1)
Alkaline Phosphatase: 64 U/L (ref 33–130)
BUN: 4 mg/dL — ABNORMAL LOW (ref 7–25)
CO2: 27 mmol/L (ref 20–31)
Calcium: 9.3 mg/dL (ref 8.6–10.4)
Chloride: 98 mmol/L (ref 98–110)
Creat: 0.56 mg/dL (ref 0.50–1.05)
GFR, Est African American: >89 mL/min (ref >=60)
GFR, Est Non African American: >89 mL/min (ref >=60)
Glucose, Bld: 93 mg/dL (ref 70–99)
Potassium: 4.3 mmol/L (ref 3.5–5.3)
Sodium: 131 mmol/L — ABNORMAL LOW (ref 135–146)
Total Bilirubin: 0.2 mg/dL (ref 0.2–1.2)
Total Protein: 7 g/dL (ref 6.1–8.1)

## 2015-09-08 LAB — VITAMIN D 25 HYDROXY (VIT D DEFICIENCY, FRACTURES): Vit D, 25-Hydroxy: 31 ng/mL (ref 30–100)

## 2015-09-09 ENCOUNTER — Encounter: Payer: Self-pay | Admitting: Family Medicine

## 2015-09-23 ENCOUNTER — Other Ambulatory Visit: Payer: Self-pay | Admitting: Physician Assistant

## 2015-09-23 NOTE — Telephone Encounter (Signed)
Medication refilled per protocol. 

## 2015-09-27 ENCOUNTER — Encounter: Payer: Self-pay | Admitting: *Deleted

## 2015-10-10 ENCOUNTER — Other Ambulatory Visit: Payer: Self-pay | Admitting: Physician Assistant

## 2015-10-10 NOTE — Telephone Encounter (Signed)
Medication refilled per protocol. 

## 2015-11-07 ENCOUNTER — Other Ambulatory Visit: Payer: Self-pay | Admitting: Physician Assistant

## 2015-11-07 NOTE — Telephone Encounter (Signed)
Ok to refill Clonazepam??  Last office visit/ refill 09/07/2015.

## 2015-11-08 NOTE — Telephone Encounter (Signed)
Made appt for Monday is out of meds can I call in 1 refill

## 2015-11-08 NOTE — Telephone Encounter (Signed)
Reviewed LOV note from 09/07/15---adjusted meds--was to have f/u OV 6 weeks. No OV scheduled. Come in for OV.

## 2015-11-08 NOTE — Telephone Encounter (Signed)
Yes. Can fill x1 with 0 additional refills.

## 2015-11-09 NOTE — Telephone Encounter (Signed)
1 rx each called in

## 2015-11-14 ENCOUNTER — Ambulatory Visit (INDEPENDENT_AMBULATORY_CARE_PROVIDER_SITE_OTHER): Payer: BLUE CROSS/BLUE SHIELD | Admitting: Physician Assistant

## 2015-11-14 VITALS — BP 144/84 | HR 72 | Temp 98.0°F | Resp 18 | Wt 154.0 lb

## 2015-11-14 DIAGNOSIS — G4700 Insomnia, unspecified: Secondary | ICD-10-CM

## 2015-11-14 DIAGNOSIS — Z720 Tobacco use: Secondary | ICD-10-CM

## 2015-11-14 DIAGNOSIS — F17200 Nicotine dependence, unspecified, uncomplicated: Secondary | ICD-10-CM

## 2015-11-14 DIAGNOSIS — F419 Anxiety disorder, unspecified: Secondary | ICD-10-CM

## 2015-11-14 DIAGNOSIS — I10 Essential (primary) hypertension: Secondary | ICD-10-CM

## 2015-11-14 DIAGNOSIS — F329 Major depressive disorder, single episode, unspecified: Secondary | ICD-10-CM

## 2015-11-14 DIAGNOSIS — F32A Depression, unspecified: Secondary | ICD-10-CM

## 2015-11-14 MED ORDER — BUPROPION HCL ER (SR) 150 MG PO TB12: ORAL_TABLET | ORAL | Status: DC

## 2015-11-14 NOTE — Progress Notes (Signed)
Patient ID: Regina Richardson MRN: [id number], DOB: [birth year], 55 y.o. Date of Encounter: 11/14/2015,   Chief Complaint: Physical (CPE)  HPI: 55 y.o. y/o female  here for CPE.    05/25/2015 OV NOTE: She presents as a new patient to establish care.  She says that she just moved here from [place]. Had been living up in [place] for about 15 years. She just recently got married right around the time that they moved here. She says that she was originally from [facility] but her husband was from [place] so they are living in this area.  She says that she has been on venlafaxine for about 15 years. Says that prior to the venlafaxine, she was initially prescribed Prozac but said that she "just didn't feel right" when on Prozac.  Says that she was taking her Klonopin 1 in the morning 1 in the evening and then 2 at bedtime.  Says that she recently went to the [facility] urgent care because she was having an allergic reaction. Says that when she was there for that visit they stopped her lisinopril and decreased her Klonopin down to just 90 per month.  She says that she thinks her depression has been worse since moving and says that her anxiety is also worse. Says that last week and then again on this past Saturday she had episodes of bad anxiety/panic attack. Felt sweaty and clammy chest discomfort shortness of breath and panicky.  Says that when they were living in [place] her husband had a different job and he was around on a daily basis. Says now he travels a lot with his job so she is alone. Also says that now she is in a new place where she doesn't know where anything is and doesn't know anyone. Also says that she did a lot of traveling back and forth from [place] during the move driving 10 hours there 10 hours back etc. Says that she thinks that just wore her out and got her offkilter. Also she says that she has a new job at [employer]. She is used to having a  set schedule but that with this job she works different hours on different days and says that every morning she has to wake up to look at the calendar to see if she is going to work him what hours if so.  Says that she hasn't gotten sleep for the past week since the Klonopin was decreased she has run out and doesn't have enough to take at nighttime.  Says that in the past when in [place] they had given her trazodone at one point. Is that did not agree with her and caused her to have crazy dreams etc.  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 Says that her husband was a Software engineer over [employer] and is currently working with a Counsellor.  Discussed doing a complete physical exam today but she is not sure that it has been a full one year since her last CPE so we will hold off on doing that.   At that OV, her Blood Pressure was at goal/controlled. Told to stay off of lisinopril. Continue Toprol-XL 50 mg daily. At that [place], her anxiety and depression and insomnia were not well controlled. Increased dose of Effexor from 37.5 mg to 75 mg daily. Continued her Klonopin at the dose that she had been taking  for now until her anxiety improves. Planned follow-up office visit in 6 weeks .  She planned to check to see the date of her last complete physical exam.  Will reschedule for complete physical exam once we know that it is a full 12 months between physicals secondary to insurance coverage.    OV---06/29/2015:  Today she basically is reporting the exact same symptoms that she reported last visit. Really does not seem that there has been any improvement at all. She even says that she is "still not sleeping, still depressed ". As she talks throughout the visit today, she continuously is wiping tears from her eyes with the tissue. Says that "he is  gone all the time--- in [place] then home for 2 days and then going to [place]" Also says that she "has a job that sucks". Says that prior to moving, she had a job that paid well at [employer]. 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 Also says that the main reason they moved here was to be closer to his parents because he realized they were getting older and needing assistance. Says that, meanwhile, her husband is gone all the time and she is the one left to help them. Also says that they just show up at her door any time, unannounced. Says that she will be in her pajamas, ready for bed and they will just show up at the door. Also says that she does not know where she is and does not know anything around here. She "gets lost if she gets off of [employer] . Says that recently she had a bad reaction and had bumps all over her body and had to get her neighbor who she would only met once to help her get to urgent care. Says that at night she will sleep for an hour or 2 then wake up with staff running through her head. Will take to Klonopin and that will help her get to sleep. Denies any homicidal or suicidal ideation.  At that OV 06/29/2015---Increased dose of Effexor to 173m QD.   At OV/ CPE 09/07/2015: She presents today for complete physical exam. Asked if she is feeling any improvement in her symptoms since increasing the dose of Effexor. She says that she really does not feel much better and really doesn't see much difference. Says that she has gotten herself a cat and thinks that is helping a little bit. Medication is causing no adverse effects. She has no other complaints or concerns.  At [place]/2017---- Added Wellbutrin SR [id number] po QD x 5 days then 1 po BID   OV 11/14/2015: Says that she is feeling better since we added this last  medication. Today she actually is smiling some and does seem happier. She says that also her husband got her a GPS and she thinks that is helping. Also says that she quit her job at [employer] has gotten a new job so thinks that this is also helping. Says that she has done the interview and start the training today for this new job. 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 [place]  been [place]--- when he is there expects her to cook food and do his laundry----says that the real adjustment because she gets in her routine while he is gone. Says that her cat is still working out very well and that is also helping.  Review of Systems: Consitutional: No fever, chills, fatigue, night sweats, lymphadenopathy. No significant/unexplained weight changes. Eyes: No visual changes, eye redness, or discharge. ENT/Mouth: No ear pain, sore throat, nasal drainage, or sinus pain. Cardiovascular: No chest pressure,heaviness, tightness or squeezing, even with exertion. No increased shortness of breath or dyspnea on exertion.No palpitations, edema, orthopnea, PND. Respiratory: No cough, hemoptysis, SOB, or wheezing. Gastrointestinal: No anorexia, dysphagia, reflux, pain, nausea, vomiting, hematemesis, diarrhea, constipation, BRBPR, or melena. Breast: No mass, nodules, bulging, or retraction. No skin changes or inflammation. No nipple discharge. No lymphadenopathy. Genitourinary: No dysuria, hematuria, incontinence, vaginal discharge, pruritis, burning, abnormal bleeding, or pain. Musculoskeletal: No decreased ROM, No joint pain or swelling. No significant pain in neck, back, or extremities. Skin: No rash, pruritis, or concerning lesions. Neurological: No headache, dizziness, syncope, seizures, tremors, memory loss, coordination problems, or paresthesias. Psychological: See HPI.  No hallucinations, SI/HI. Endocrine: No polydipsia, polyphagia, polyuria, or known diabetes.No increased fatigue. No palpitations/rapid heart rate. No significant/unexplained weight change. All other systems were reviewed and are otherwise negative.  Past Medical History  Diagnosis Date  . Anxiety   . Depression      Past Surgical History  Procedure Laterality Date  . Abdominal hysterectomy      still has ovaries    Home Meds:  Outpatient Prescriptions Prior to Visit  Medication Sig Dispense Refill  . clonazePAM (KLONOPIN) 0.5 MG tablet TAKE 1 TABLET BY MOUTH EVERY MORNING AND AFTERNOON, AND TAKE 2 TABLETS AT BEDTIME 120 tablet 0  . metoprolol succinate (TOPROL-XL) 50 MG 24 hr tablet TAKE 1 TABLET (50 MG TOTAL) BY MOUTH DAILY. 90 tablet 1  . venlafaxine XR (EFFEXOR-XR) 150 MG 24 hr capsule TAKE 1 CAPSULE (150 MG TOTAL) BY MOUTH DAILY WITH BREAKFAST. 30 capsule 0  . buPROPion (WELLBUTRIN SR) 150 MG 12 hr tablet Take 1 daily for 5 days then increase to 1 twice a day. 60 tablet 2   No facility-administered medications prior to visit.    Allergies: No Known Allergies  Social History   Social History  . Marital Status: Single    Spouse Name: N/A  . Number of Children: N/A  . Years of Education: N/A   Occupational History  . Not on file.   Social History Main Topics  . Smoking status: Current Every Day Smoker -- 0.50 packs/day    Types: Cigarettes  . Smokeless tobacco: Never Used  . Alcohol Use: 3.6 oz/week    6 Cans of beer per week  . Drug Use: No  . Sexual Activity: Not Currently   Other Topics Concern  . Not on file   Social History Narrative    Family History  Problem Relation Age of Onset  . Family history unknown: Yes    Physical Exam: Blood pressure 144/84, pulse 72, temperature 98 F (36.7 C), temperature source Oral, resp. rate 18, weight 154 lb (69.854 kg)., Body mass index is 28.16 kg/(m^2). General: Well developed, well nourished, WF. Appears in  no acute distress. Neck: Supple. Trachea midline. No thyromegaly. Full ROM. No lymphadenopathy.No Carotid Bruits. Lungs: Clear to auscultation bilaterally without wheezes, rales, or rhonchi. Breathing is of normal effort and unlabored. Cardiovascular: RRR with S1 S2. No murmurs, rubs, or gallops. Distal pulses  2+ symmetrically. No carotid or abdominal bruits. Musculoskeletal: Full range of motion and 5/5 strength throughout.  Skin: Warm and moist without erythema, ecchymosis, wounds, or rash. Neuro: A+Ox3. CN II-XII grossly intact. Moves all extremities spontaneously. Full sensation throughout. Normal gait. DTR 2+ throughout upper and lower extremities.  Psych:  Responds to questions appropriately with a normal affect.   Assessment/Plan:  54 y.o. y/o female here for Anxiety  Note: In past trazodone caused "crazy dreams" and "did not agree with her " ------- in past Prozac caused her to "just did not feel right " She is to continue current dose of Effexor 150 mg daily. Continue current dose of Wellbutrin.  Continue Klonopin but use as sparingly as possible.   Depression Note: In past trazodone caused "crazy dreams" and "did not agree with her " ------- in past Prozac caused her to "just did not feel right " She is to continue current dose of Effexor 150 mg daily. Continue current dose of Wellbutrin.  Continue Klonopin but use as sparingly as possible.    Essential hypertension Blood Pressure is controlled/at goal. Continue current medications. Labs checked to monitor 2/12017--normal.  Insomnia Continue Klonopin PRN but use as sparingly as possible.   Smoker Hopefully the Wellbutrin will help with smoking cessation and addition to her anxiety and depression symptoms.  THE FOLLOWING IS COPIED FROM HER CPE 09/07/2015:   Visit for preventive health examination  A. Screening Labs: She is fasting. Check labs now. - CBC with Differential/Platelet - COMPLETE METABOLIC PANEL WITH  GFR - Lipid panel - TSH - VITAMIN D 25 Hydroxy (Vit-D Deficiency, Fractures)   B. Pap: She reports that she had hysterectomy and this was NOT performed secondary to cancer. Therefore does not need Pap smear. Pelvic exam normal.  C. Screening Mammogram: She reports that last mamm[place] greater [place] She is agreeable for me to go ahead and schedule mammogram locally.  - MM Digital Screening; Future   D. DEXA/BMD:  At future visit, will discuss age of hysterectomy and whether ovaries were removed and whether she has been on any HRT to determine age that we need to start screening for osteoporosis with  bone density scanning.  E. Colorectal Cancer Screening: She reports that she has had one colonoscopy. She does not know her exact age or the exact date when she had it, but says it was done prior to age 43.  Says it was normal except for some hemorrhoids and was informed to repeat 10 years. She thinks that it is due in about 2 years.  F. Immunizations:  Influenza: She received here 05/25/2015 Tetanus: He thinks that she has received a tetanus vaccine in the past 10 years. I have records to know exact date. Pneumococcal: She reports that she has not had any pneumonia vaccine. She is a smoker. Discussed that she needs Pneumovax 23 and she is agreeable to receive this today.               Pneumovax 23---Given here 09/07/2015    Prevnar 13.-------Give at age 34 Zostavax:---------------------Give at age 79   Will plan follow-up office visit 6 months if things remain stable. F/U sooner if needed.   Marin Olp Hauser, [place], [facility] 11/14/2015 8:28 AM

## 2015-12-08 ENCOUNTER — Other Ambulatory Visit: Payer: Self-pay | Admitting: Physician Assistant

## 2015-12-09 ENCOUNTER — Telehealth: Payer: Self-pay | Admitting: Physician Assistant

## 2015-12-09 NOTE — Telephone Encounter (Signed)
LOV 11/14/15  LRF clonazepam 11/09/15 #120  OK refill?

## 2015-12-09 NOTE — Telephone Encounter (Signed)
Tell pt that I will give # 120 again now, but at next refill will need to decrease to #90 per month.  Approved. #120 + 0.

## 2015-12-09 NOTE — Telephone Encounter (Signed)
Refills to pharmacy.  Pt was advised that next month provider is going to start cutting back on Alprazolam.

## 2016-03-04 ENCOUNTER — Other Ambulatory Visit: Payer: Self-pay | Admitting: Physician Assistant

## 2016-03-05 NOTE — Telephone Encounter (Signed)
LRF Clonazepam #120  LOV 11/14/15  OK refill?

## 2016-03-05 NOTE — Telephone Encounter (Signed)
Reviewed chart. Last Rx given 12/09/2015. For # 120 + 0. Call pt and explain to her that this is a controlled substance and--- as we discussed at her last office visit-- I want her to use this as sparingly as possible (which she is).  Also tell her that we generally prescribe these medications in the lowest quantity possible --but,  at the same time --- I'll give her enough to make sure that it definitely is enough to last her for a month supply. Tell her I wanted her to be aware that we are going to decrease the quantity in the prescription but to call us when she needs refill. Give #60+0.

## 2016-03-05 NOTE — Telephone Encounter (Signed)
I called pt to let her know about change in quantity to Clonazepam.  Pt said she did not need refill of that, still has plenty at home.  Refill was discontinued, did refill Metoprolol.  Reminded her to follow up at end of October.

## 2016-05-09 ENCOUNTER — Ambulatory Visit (INDEPENDENT_AMBULATORY_CARE_PROVIDER_SITE_OTHER): Payer: BLUE CROSS/BLUE SHIELD | Admitting: Physician Assistant

## 2016-05-09 ENCOUNTER — Encounter: Payer: Self-pay | Admitting: Physician Assistant

## 2016-05-09 VITALS — BP 142/80 | HR 78 | Temp 98.2°F | Resp 18 | Wt 154.0 lb

## 2016-05-09 DIAGNOSIS — I10 Essential (primary) hypertension: Secondary | ICD-10-CM

## 2016-05-09 DIAGNOSIS — F17200 Nicotine dependence, unspecified, uncomplicated: Secondary | ICD-10-CM

## 2016-05-09 DIAGNOSIS — Z23 Encounter for immunization: Secondary | ICD-10-CM

## 2016-05-09 DIAGNOSIS — R3915 Urgency of urination: Secondary | ICD-10-CM

## 2016-05-09 DIAGNOSIS — F419 Anxiety disorder, unspecified: Secondary | ICD-10-CM

## 2016-05-09 DIAGNOSIS — G4700 Insomnia, unspecified: Secondary | ICD-10-CM

## 2016-05-09 DIAGNOSIS — F329 Major depressive disorder, single episode, unspecified: Secondary | ICD-10-CM | POA: Diagnosis not present

## 2016-05-09 DIAGNOSIS — F32A Depression, unspecified: Secondary | ICD-10-CM

## 2016-05-09 DIAGNOSIS — R350 Frequency of micturition: Secondary | ICD-10-CM | POA: Diagnosis not present

## 2016-05-09 DIAGNOSIS — N3281 Overactive bladder: Secondary | ICD-10-CM

## 2016-05-09 LAB — URINALYSIS, ROUTINE W REFLEX MICROSCOPIC
Bilirubin Urine: NEGATIVE
Glucose, UA: NEGATIVE
Hgb urine dipstick: NEGATIVE
Ketones, ur: NEGATIVE
Leukocytes, UA: NEGATIVE
Nitrite: NEGATIVE
Protein, ur: NEGATIVE
Specific Gravity, Urine: <1.005 (ref 1.001–1.035)
pH: 6 (ref 5.0–8.0)

## 2016-05-09 MED ORDER — TOLTERODINE TARTRATE ER 2 MG PO CP24: 2.0000 mg | ORAL_CAPSULE | Freq: Every day | ORAL | 3 refills | Status: DC

## 2016-05-09 NOTE — Progress Notes (Signed)
Patient ID: Regina Richardson MRN: [id number], DOB: 16-May-1961, 55 y.o. Date of Encounter: 05/09/2016,   Chief Complaint: Physical (CPE)  HPI: 55 y.o. y/o female  here for CPE.    05/25/2015 OV NOTE: She presents as a new patient to establish care.  She says that she just moved here from [place]. Had been living up in [place] for about 15 years. She just recently got married right around the time that they moved here. She says that she was originally from [facility] but her husband was from [place] so they are living in this area.  She says that she has been on venlafaxine for about 15 years. Says that prior to the venlafaxine, she was initially prescribed Prozac but said that she "just didn't feel right" when on Prozac.  Says that she was taking her Klonopin 1 in the morning 1 in the evening and then 2 at bedtime.  Says that she recently went to the [facility] urgent care because she was having an allergic reaction. Says that when she was there for that visit they stopped her lisinopril and decreased her Klonopin down to just 90 per month.  She says that she thinks her depression has been worse since moving and says that her anxiety is also worse. Says that last week and then again on this past Saturday she had episodes of bad anxiety/panic attack. Felt sweaty and clammy chest discomfort shortness of breath and panicky.  Says that when they were living in [place] her husband had a different job and he was around on a daily basis. Says now he travels a lot with his job so she is alone. Also says that now she is in a new place where she doesn't know where anything is and doesn't know anyone. Also says that she did a lot of traveling back and forth from [place] during the move driving 10 hours there 10 hours back etc. Says that she thinks that just wore her out and got her offkilter. Also she says that she has a new job at [employer]. She is used to having a  set schedule but that with this job she works different hours on different days and says that every morning she has to wake up to look at the calendar to see if she is going to work him what hours if so.  Says that she hasn't gotten sleep for the past week since the Klonopin was decreased she has run out and doesn't have enough to take at nighttime.  Says that in the past when in [place] they had given her trazodone at one point. Is that did not agree with her and caused her to have crazy dreams etc.  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 Says that her husband was a Software engineer over [employer] and is currently working with a Counsellor.  Discussed doing a complete physical exam today but she is not sure that it has been a full one year since her last CPE so we will hold off on doing that.   At that OV, her Blood Pressure was at goal/controlled. Told to stay off of lisinopril. Continue Toprol-XL 50 mg daily. At that [place], her anxiety and depression and insomnia were not well controlled. Increased dose of Effexor from 37.5 mg to 75 mg daily. Continued her Klonopin at the dose that she had been taking  for now until her anxiety improves. Planned follow-up office visit in 6 weeks .  She planned to check to see the date of her last complete physical exam.  Will reschedule for complete physical exam once we know that it is a full 12 months between physicals secondary to insurance coverage.    OV---06/29/2015:  Today she basically is reporting the exact same symptoms that she reported last visit. Really does not seem that there has been any improvement at all. She even says that she is "still not sleeping, still depressed ". As she talks throughout the visit today, she continuously is wiping tears from her eyes with the tissue. Says that "he is  gone all the time--- in [place] then home for 2 days and then going to [place]" Also says that she "has a job that sucks". Says that prior to moving, she had a job that paid well at [employer]. 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 Also says that the main reason they moved here was to be closer to his parents because he realized they were getting older and needing assistance. Says that, meanwhile, her husband is gone all the time and she is the one left to help them. Also says that they just show up at her door any time, unannounced. Says that she will be in her pajamas, ready for bed and they will just show up at the door. Also says that she does not know where she is and does not know anything around here. She "gets lost if she gets off of [employer] . Says that recently she had a bad reaction and had bumps all over her body and had to get her neighbor who she would only met once to help her get to urgent care. Says that at night she will sleep for an hour or 2 then wake up with staff running through her head. Will take to Klonopin and that will help her get to sleep. Denies any homicidal or suicidal ideation.  At that OV 06/29/2015---Increased dose of Effexor to 173m QD.   At OV/ CPE 09/07/2015: She presents today for complete physical exam. Asked if she is feeling any improvement in her symptoms since increasing the dose of Effexor. She says that she really does not feel much better and really doesn't see much difference. Says that she has gotten herself a cat and thinks that is helping a little bit. Medication is causing no adverse effects. She has no other complaints or concerns.  At [facility]2017---- Added Wellbutrin SR [id number] po QD x 5 days then 1 po BID   OV 11/14/2015: Says that she is feeling better since we added this last  medication. Today she actually is smiling some and does seem happier. She says that also her husband got her a GPS and she thinks that is helping. Also says that she quit her job at [employer] has gotten a new job so thinks that this is also helping. Says that she has done the interview and start the training today for this new job. 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 [place]  [place]--- when he is there expects her to cook food and do his laundry----says that the real adjustment because she gets in her routine while he is gone. Says that her cat is still working out very well and that is also helping.  05/09/2016: She states that she has been working at [employer] but actually is starting a new job Monday with [employer] which is located in [facility]. He says that her prior job at [employer] she was having to walk a whole mile to her work station and a half a mile to the bathroom. Had to wear steel toed shoes. Anxiety/depression: He states that these are both well controlled with her current medications. Her mood is stable. She is having no adverse effects with these medications. Is that she probably has about 20 Klonopin left in her bottle. Says that she takes one of these every night. He needs them during the day of her husband is on her nerves. Says that he is out of town for work most of the time so doesn't need them during the day during that time. Hypertension: He is taking the metoprolol as directed. This is causing no adverse effects.  Her only complaint/concern today is that she states that she has been having urinary urgency for a long time. Says that sometimes she rushes to the bathroom and then urine comes out before she can get on the toilet. She is having no dysuria. No fevers or chills.   Review of  Systems: Consitutional: No fever, chills, fatigue, night sweats, lymphadenopathy. No significant/unexplained weight changes. Eyes: No visual changes, eye redness, or discharge. ENT/Mouth: No ear pain, sore throat, nasal drainage, or sinus pain. Cardiovascular: No chest pressure,heaviness, tightness or squeezing, even with exertion. No increased shortness of breath or dyspnea on exertion.No palpitations, edema, orthopnea, PND. Respiratory: No cough, hemoptysis, SOB, or wheezing. Gastrointestinal: No anorexia, dysphagia, reflux, pain, nausea, vomiting, hematemesis, diarrhea, constipation, BRBPR, or melena. Breast: No mass, nodules, bulging, or retraction. No skin changes or inflammation. No nipple discharge. No lymphadenopathy. Genitourinary: No dysuria, hematuria, incontinence, vaginal discharge, pruritis, burning, abnormal bleeding, or pain. Musculoskeletal: No decreased ROM, No joint pain or swelling. No significant pain in neck, back, or extremities. Skin: No rash, pruritis, or concerning lesions. Neurological: No headache, dizziness, syncope, seizures, tremors, memory loss, coordination problems, or paresthesias. Psychological: See HPI. No hallucinations, SI/HI. Endocrine: No polydipsia, polyphagia, polyuria, or known diabetes.No increased fatigue. No palpitations/rapid heart rate. No significant/unexplained weight change. All other systems were reviewed and are otherwise negative.  Past Medical History:  Diagnosis Date  . Anxiety   . Depression      Past Surgical History:  Procedure Laterality Date  . ABDOMINAL HYSTERECTOMY     still has ovaries    Home Meds:  Outpatient Medications Prior to Visit  Medication Sig Dispense Refill  . buPROPion (WELLBUTRIN SR) 150 MG 12 hr tablet Take 1 twice a day. 60 tablet 5  . clonazePAM (KLONOPIN) 0.5 MG tablet TAKE 1 TABLET BY MOUTH EVERY MORNING AND AFTERNOON, AND TAKE 2 TABLETS AT BEDTIME 120 tablet 0  . metoprolol succinate (TOPROL-XL) 50  MG 24 hr tablet TAKE 1 TABLET BY MOUTH EVERY DAY 90 tablet 0  . venlafaxine XR (EFFEXOR-XR) 150 MG 24 hr capsule TAKE ONE CAPSULE BY MOUTH IN THE MORNING W/BREAKFAST 30 capsule 5   No facility-administered medications prior to visit.     Allergies: No Known Allergies  Social History   Social History  .  Marital status: Single    Spouse name: N/A  . Number of children: N/A  . Years of education: N/A   Occupational History  . Not on file.   Social History Main Topics  . Smoking status: Current Every Day Smoker    Packs/day: 0.50    Types: Cigarettes  . Smokeless tobacco: Never Used  . Alcohol use 3.6 oz/week    6 Cans of beer per week  . Drug use: No  . Sexual activity: Not Currently   Other Topics Concern  . Not on file   Social History Narrative  . No narrative on file    Family History  Problem Relation Age of Onset  . Family history unknown: Yes    Physical Exam: Blood pressure (!) 142/80, pulse 78, temperature 98.2 F (36.8 C), temperature source Oral, resp. rate 18, weight 154 lb (69.9 kg), SpO2 98 %., Body mass index is 28.17 kg/m. General: Well developed, well nourished, WF. Appears in no acute distress. Neck: Supple. Trachea midline. No thyromegaly. Full ROM. No lymphadenopathy.No Carotid Bruits. Lungs: Clear to auscultation bilaterally without wheezes, rales, or rhonchi. Breathing is of normal effort and unlabored. Cardiovascular: RRR with S1 S2. No murmurs, rubs, or gallops. Distal pulses 2+ symmetrically. No carotid or abdominal bruits. Musculoskeletal: Full range of motion and 5/5 strength throughout.  Skin: Warm and moist without erythema, ecchymosis, wounds, or rash. Neuro: A+Ox3. CN II-XII grossly intact. Moves all extremities spontaneously. Full sensation throughout. Normal gait. DTR 2+ throughout upper and lower extremities.  Psych:  Responds to questions appropriately with a normal affect.   Assessment/Plan:  55 y.o. y/o female here  for Anxiety  Note: In past trazodone caused "crazy dreams" and "did not agree with her " ------- in past Prozac caused her to "just did not feel right " She is to continue current dose of Effexor 150 mg daily. Continue current dose of Wellbutrin.  Continue Klonopin but use as sparingly as possible.   Depression Note: In past trazodone caused "crazy dreams" and "did not agree with her " ------- in past Prozac caused her to "just did not feel right " She is to continue current dose of Effexor 150 mg daily. Continue current dose of Wellbutrin.  Continue Klonopin but use as sparingly as possible.    Essential hypertension Blood Pressure is controlled/at goal. Continue current medications. She is on atenolol so this does not require any lab monitoring.  OAB (overactive bladder) At visit 05/09/16 started Detrol LA 2 mg daily. I told her to take this for couple of weeks. If this causes any adverse effects and call me. If this does not control her symptoms and call me as well. Increasing the dose or changing to a different medication. At visit 05/09/16 urinalysis was performed and is normal. - tolterodine (DETROL LA) 2 MG 24 hr capsule; Take 1 capsule (2 mg total) by mouth daily.  Dispense: 30 capsule; Refill: 3   Insomnia Continue Klonopin PRN but use as sparingly as possible.   Smoker Hopefully the Wellbutrin will help with smoking cessation and addition to her anxiety and depression symptoms.  THE FOLLOWING IS COPIED FROM HER CPE 09/07/2015:   Visit for preventive health examination  A. Screening Labs: She is fasting. Check labs now. - CBC with Differential/Platelet - COMPLETE METABOLIC PANEL WITH GFR - Lipid panel - TSH - VITAMIN D 25 Hydroxy (Vit-D Deficiency, Fractures)   B. Pap: She reports that she had hysterectomy and this was NOT performed secondary to  cancer. Therefore does not need Pap smear. Pelvic exam normal.  C. Screening Mammogram: She reports that last  [place] greater [place] She is agreeable for me to go ahead and schedule mammogram locally.  - MM Digital Screening; Future   D. DEXA/BMD:  At future visit, will discuss age of hysterectomy and whether ovaries were removed and whether she has been on any HRT to determine age that we need to start screening for osteoporosis with  bone density scanning.  E. Colorectal Cancer Screening: She reports that she has had one colonoscopy. She does not know her exact age or the exact date when she had it, but says it was done prior to age 64.  Says it was normal except for some hemorrhoids and was informed to repeat 10 years. She thinks that it is due in about 2 years.  F. Immunizations:  Influenza: She received here 05/25/2015 Tetanus: He thinks that she has received a tetanus vaccine in the past 10 years. I have records to know exact date. Pneumococcal: She reports that she has not had any pneumonia vaccine. She is a smoker. Discussed that she needs Pneumovax 23 and she is agreeable to receive this today.               Pneumovax 23---Given here 09/07/2015    Prevnar 13.-------Give at age 68 Zostavax:---------------------Give at age 30   Will plan follow-up office visit 6 months if things remain stable. F/U sooner if needed.   Signed, [address] [place], [place], [facility] 05/09/2016 11:34 AM

## 2016-05-09 NOTE — Addendum Note (Signed)
Addended by: Vonna Kotyk A on: 05/09/2016 05:00 PM   Modules accepted: Orders

## 2016-06-01 ENCOUNTER — Other Ambulatory Visit: Payer: Self-pay | Admitting: Physician Assistant

## 2016-06-01 DIAGNOSIS — F32A Depression, unspecified: Secondary | ICD-10-CM

## 2016-06-01 DIAGNOSIS — F329 Major depressive disorder, single episode, unspecified: Secondary | ICD-10-CM

## 2016-06-01 DIAGNOSIS — F419 Anxiety disorder, unspecified: Secondary | ICD-10-CM

## 2016-06-01 DIAGNOSIS — F17200 Nicotine dependence, unspecified, uncomplicated: Secondary | ICD-10-CM

## 2016-06-04 ENCOUNTER — Other Ambulatory Visit: Payer: Self-pay | Admitting: Physician Assistant

## 2016-06-04 NOTE — Telephone Encounter (Signed)
Reviewed her last office visit note and reviewed refill history. Please change the Sig on this Rx to say "take one twice daily as needed" and change quantity to # 60 + 0

## 2016-06-04 NOTE — Telephone Encounter (Signed)
Ok to refill??  Last office visit 05/09/2016.  Last refill 12/08/2015.

## 2016-06-05 ENCOUNTER — Other Ambulatory Visit: Payer: Self-pay | Admitting: Physician Assistant

## 2016-06-05 ENCOUNTER — Other Ambulatory Visit: Payer: Self-pay

## 2016-06-05 MED ORDER — METOPROLOL SUCCINATE ER 50 MG PO TB24: 50.0000 mg | ORAL_TABLET | Freq: Every day | ORAL | 3 refills | Status: DC

## 2016-06-05 NOTE — Telephone Encounter (Signed)
Rx refilled per protocol.  Pt req RX for allergy medication. Explained to pt because she wasn't sure if it was allergies or a cold I recommended she be seen by PCP. Pt stated she would check with the pharmacy for OTC medication

## 2016-06-05 NOTE — Telephone Encounter (Signed)
Rx called in 

## 2016-08-03 ENCOUNTER — Other Ambulatory Visit: Payer: Self-pay | Admitting: Physician Assistant

## 2016-08-03 NOTE — Telephone Encounter (Signed)
Approved. #[id number]. 

## 2016-08-03 NOTE — Telephone Encounter (Signed)
rx called in

## 2016-08-03 NOTE — Telephone Encounter (Signed)
Last Ov 10-18 Last refill 11-14 #60 Ok to refill?

## 2016-08-30 DIAGNOSIS — H00021 Hordeolum internum right upper eyelid: Secondary | ICD-10-CM | POA: Diagnosis not present

## 2016-10-20 ENCOUNTER — Other Ambulatory Visit: Payer: Self-pay | Admitting: Physician Assistant

## 2016-10-22 NOTE — Telephone Encounter (Signed)
Last OV 10-18 Last refill 1-12 Ok to refill?

## 2016-10-22 NOTE — Telephone Encounter (Signed)
Approved # [id number] 

## 2016-10-22 NOTE — Telephone Encounter (Signed)
RX called in .

## 2016-11-06 DIAGNOSIS — M545 Low back pain: Secondary | ICD-10-CM | POA: Diagnosis not present

## 2016-11-15 ENCOUNTER — Ambulatory Visit
Admission: RE | Admit: 2016-11-15 | Discharge: 2016-11-15 | Disposition: A | Discharge status: Home / Self Care | Payer: BLUE CROSS/BLUE SHIELD | Source: Ambulatory Visit | Attending: Physician Assistant | Admitting: Physician Assistant

## 2016-11-15 ENCOUNTER — Encounter: Payer: Self-pay | Admitting: Physician Assistant

## 2016-11-15 ENCOUNTER — Ambulatory Visit (INDEPENDENT_AMBULATORY_CARE_PROVIDER_SITE_OTHER): Payer: BLUE CROSS/BLUE SHIELD | Admitting: Physician Assistant

## 2016-11-15 VITALS — BP 136/84 | HR 75 | Temp 97.8°F | Resp 16 | Wt 156.4 lb

## 2016-11-15 DIAGNOSIS — H52223 Regular astigmatism, bilateral: Secondary | ICD-10-CM | POA: Diagnosis not present

## 2016-11-15 DIAGNOSIS — M5440 Lumbago with sciatica, unspecified side: Secondary | ICD-10-CM | POA: Diagnosis not present

## 2016-11-15 DIAGNOSIS — R2242 Localized swelling, mass and lump, left lower limb: Secondary | ICD-10-CM

## 2016-11-15 DIAGNOSIS — M47816 Spondylosis without myelopathy or radiculopathy, lumbar region: Secondary | ICD-10-CM | POA: Diagnosis not present
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
## 2016-11-19 ENCOUNTER — Ambulatory Visit
Admission: RE | Admit: 2016-11-19 | Discharge: 2016-11-19 | Disposition: A | Discharge status: Home / Self Care | Payer: BLUE CROSS/BLUE SHIELD | Source: Ambulatory Visit | Attending: Physician Assistant | Admitting: Physician Assistant

## 2016-11-19 DIAGNOSIS — R2242 Localized swelling, mass and lump, left lower limb: Secondary | ICD-10-CM

## 2016-11-19 DIAGNOSIS — M7989 Other specified soft tissue disorders: Secondary | ICD-10-CM | POA: Diagnosis not present

## 2016-11-22 ENCOUNTER — Other Ambulatory Visit: Payer: Self-pay | Admitting: Physician Assistant

## 2016-11-22 ENCOUNTER — Encounter: Payer: Self-pay | Admitting: Physician Assistant

## 2016-11-22 ENCOUNTER — Ambulatory Visit (INDEPENDENT_AMBULATORY_CARE_PROVIDER_SITE_OTHER): Payer: BLUE CROSS/BLUE SHIELD | Admitting: Physician Assistant

## 2016-11-22 VITALS — BP 110/64 | HR 72 | Temp 98.0°F | Resp 14 | Wt 155.4 lb

## 2016-11-22 DIAGNOSIS — M4186 Other forms of scoliosis, lumbar region: Secondary | ICD-10-CM

## 2016-11-22 DIAGNOSIS — M545 Low back pain, unspecified: Secondary | ICD-10-CM

## 2016-11-22 DIAGNOSIS — F329 Major depressive disorder, single episode, unspecified: Secondary | ICD-10-CM

## 2016-11-22 DIAGNOSIS — M419 Scoliosis, unspecified: Secondary | ICD-10-CM | POA: Insufficient documentation

## 2016-11-22 DIAGNOSIS — F17200 Nicotine dependence, unspecified, uncomplicated: Secondary | ICD-10-CM

## 2016-11-22 DIAGNOSIS — F419 Anxiety disorder, unspecified: Secondary | ICD-10-CM

## 2016-11-22 DIAGNOSIS — F32A Depression, unspecified: Secondary | ICD-10-CM

## 2016-11-22 MED ORDER — TRAMADOL HCL 50 MG PO TABS: ORAL_TABLET | ORAL | 0 refills | Status: DC

## 2016-11-22 NOTE — Progress Notes (Addendum)
Patient ID: Regina Richardson MRN: [id number], DOB: [birth year], 56 y.o. Date of Encounter: @DATE @  Chief Complaint:  Chief Complaint  Patient presents with  . back pain f/u    HPI: 56 y.o. year old female  presents with above.   11/15/2016 OV--- she discussed low back pain as well as other issues. The parts of that OV note pertaining to back pain are copied below:  Regarding the back pain-- she does bring in a [facility] that [facility]/16. This paper does show that they prescribed prednisone ibuprofen 800 and cyclobenzaprine. She states that she took 1 dose of prednisone and it caused palpitations so she did not take any more prednisone. She took a half of one ibuprofen--didn't take any more. She never took any of the cyclobenzaprine. 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 Says that that day 11/06/16 she squatted down and then literally could not get back up. Says that she thinks that her low back went into a spasm. Called here but only could get voice mail so she went to an urgent care. Says that she has not worked since that episode. Says that she will not be able to work at all through that temp agency anymore now that this has happened.  AT THAT OV: Obtained XRay Lumbar spine. This showed some scoliosis and DDD.    TODAY---11/22/2016: She states that she is continuing to have significant low back pain and points midline to low back around L3 down to L5. Says that she "can't bend, can't pick up anything "because develops burning pain in her mid low back when she tries to do these activities. Also says that she is having significant pain in that area at night and cannot sleep secondary to pain there. Says that she ends up having to prop herself on pillows in  order to get any rest. Says that when she is sitting, her back feels good but then when she tries to get up she has significant difficulty because of pain in that area. Having no pain, numbness, tingling down either leg right now.    Past Medical History:  Diagnosis Date  . Anxiety   . Depression      Home Meds: Outpatient Medications Prior to Visit  Medication Sig Dispense Refill  . clonazePAM (KLONOPIN) 0.5 MG tablet TAKE 1 TABLET BY MOUTH TWICE A DAY AS NEEDED 60 tablet 1  . ibuprofen (ADVIL,MOTRIN) 800 MG tablet Take 800 mg by mouth 3 (three) times daily as needed.  0  . metoprolol succinate (TOPROL-XL) 50 MG 24 hr tablet Take 1 tablet (50 mg total) by mouth daily. Take with or immediately following a meal. 90 tablet 3  . buPROPion (WELLBUTRIN SR) 150 MG 12 hr tablet TAKE 1 TABLET BY MOUTH TWICE A DAY 60 tablet 4  . venlafaxine XR (EFFEXOR-XR) 150 MG 24 hr capsule TAKE ONE CAPSULE BY MOUTH IN THE MORNING W/BREAKFAST 30 capsule 5  . tolterodine (DETROL LA) 2 MG 24 hr capsule Take 1 capsule (2 mg total) by mouth daily. 30 capsule 3   No facility-administered medications prior to visit.     Allergies: No Known Allergies  Social History   Social History  . Marital status:  Single    Spouse name: N/A  . Number of children: N/A  . Years of education: N/A   Occupational History  . Not on file.   Social History Main Topics  . Smoking status: Current Every Day Smoker    Packs/day: 0.50    Types: Cigarettes  . Smokeless tobacco: Never Used  . Alcohol use 3.6 oz/week    6 Cans of beer per week  . Drug use: No  . Sexual activity: Not Currently   Other Topics Concern  . Not on file   Social History Narrative  . No narrative on file    Family History  Problem Relation Age of Onset  . Family history unknown: Yes     Review of Systems:  See HPI for pertinent ROS. All other ROS negative.    Physical Exam: Blood pressure 110/64, pulse 72, temperature 98 F (36.7 C),  temperature source Oral, resp. rate 14, weight 155 lb 6.4 oz (70.5 kg), SpO2 98 %., Body mass index is 28.42 kg/m. General: WNWD WF. Appears in no acute distress. Neck: Supple. No thyromegaly. No lymphadenopathy. Lungs: Clear bilaterally to auscultation without wheezes, rales, or rhonchi. Breathing is unlabored. Heart: RRR with S1 S2. No murmurs, rubs, or gallops. Musculoskeletal:  Strength and tone normal for age. She has pain near midline at ~ L3 - L5 levels.  SLR and hip abduction --bilaterally--causes increased pain in this area Extremities/Skin: Warm and dry. Neuro: Alert and oriented X 3. Moves all extremities spontaneously. Gait is normal. CNII-XII grossly in tact. Psych: She cries through last 3/4 of visit.     ASSESSMENT AND PLAN:  56 y.o. year old female with   1. Other form of scoliosis of lumbar spine - MR Lumbar Spine Wo Contrast; Future - traMADol (ULTRAM) 50 MG tablet; Take 1 - 2 at night as needed for pain  Dispense: 60 tablet; Refill: 0  2. Midline low back pain without sciatica, unspecified chronicity - MR Lumbar Spine Wo Contrast; Future - traMADol (ULTRAM) 50 MG tablet; Take 1 - 2 at night as needed for pain  Dispense: 60 tablet; Refill: 0  Will obtain MRI lumbar spine. Will prescribe tramadol for her to use for pain at night so that she can get sleep. Will follow up with her once I get MRI report.  ------------------------------------------ADDENDUM ADDED 11/28/2016------------------------------------------------------------------------------------------ Referral Staff spoke to me and gave me paperwork---MRI denied so far---she even tried to appeal, etc----- Pt has to be on treatment and monitored -- for 6 weeks-- before insurance will approve/ cover MRI.  Currently, pt has no f/u appt scheduled. ----- Will have her schedule f/u OV 6 weeks. -------  Will add additional medications for her to use in the interim, as well.  Send 2 Rxes--- Flexeril 10mg  1 po TID ----  caution--may cause drowsiness. If causes drowsiness---only take at night---- # 30 + 2 --------------------- Mobic 7.5mg  1 po QD with food--------# 30 + 2  Signed, [address] [place], [place], [facility] 11/22/2016 10:50 AM

## 2016-11-22 NOTE — Telephone Encounter (Signed)
Refill appropriate 

## 2016-11-28 ENCOUNTER — Telehealth: Payer: Self-pay

## 2016-11-28 MED ORDER — CYCLOBENZAPRINE HCL 10 MG PO TABS: 10.0000 mg | ORAL_TABLET | Freq: Three times a day (TID) | ORAL | 2 refills | Status: DC | PRN

## 2016-11-28 MED ORDER — MELOXICAM 7.5 MG PO TABS: 7.5000 mg | ORAL_TABLET | Freq: Every day | ORAL | 2 refills | Status: DC

## 2016-11-28 NOTE — Telephone Encounter (Signed)
Called patient and explained that her insurance would not cover her MRI unless she had been on treatment and monitored for 6 weeks.  Patient is aware of the two medications she will begin taking flexeril and Mobic.Patient has made an appointment to follow up with PCP in 6 weeks.

## 2017-01-01 ENCOUNTER — Other Ambulatory Visit: Payer: Self-pay

## 2017-01-01 DIAGNOSIS — F419 Anxiety disorder, unspecified: Secondary | ICD-10-CM

## 2017-01-01 DIAGNOSIS — F17200 Nicotine dependence, unspecified, uncomplicated: Secondary | ICD-10-CM

## 2017-01-01 MED ORDER — BUPROPION HCL ER (SR) 150 MG PO TB12: 150.0000 mg | ORAL_TABLET | Freq: Two times a day (BID) | ORAL | 4 refills | Status: DC

## 2017-01-01 MED ORDER — VENLAFAXINE HCL ER 150 MG PO CP24: ORAL_CAPSULE | ORAL | 5 refills | Status: DC

## 2017-01-01 MED ORDER — MELOXICAM 7.5 MG PO TABS: 7.5000 mg | ORAL_TABLET | Freq: Every day | ORAL | 2 refills | Status: DC

## 2017-01-01 NOTE — Telephone Encounter (Signed)
Refill appropriate 

## 2017-01-04 ENCOUNTER — Other Ambulatory Visit: Payer: Self-pay

## 2017-01-04 DIAGNOSIS — F17200 Nicotine dependence, unspecified, uncomplicated: Secondary | ICD-10-CM

## 2017-01-04 DIAGNOSIS — F419 Anxiety disorder, unspecified: Secondary | ICD-10-CM

## 2017-01-04 MED ORDER — MELOXICAM 7.5 MG PO TABS: 7.5000 mg | ORAL_TABLET | Freq: Every day | ORAL | 0 refills | Status: DC

## 2017-01-04 MED ORDER — VENLAFAXINE HCL ER 150 MG PO CP24: ORAL_CAPSULE | ORAL | 0 refills | Status: DC

## 2017-01-04 MED ORDER — BUPROPION HCL ER (SR) 150 MG PO TB12: 150.0000 mg | ORAL_TABLET | Freq: Two times a day (BID) | ORAL | 0 refills | Status: DC

## 2017-01-04 NOTE — Telephone Encounter (Signed)
[facility] pharmacy faxed over req for a 90 day supply of the RX's

## 2017-01-09 ENCOUNTER — Ambulatory Visit (INDEPENDENT_AMBULATORY_CARE_PROVIDER_SITE_OTHER): Payer: BLUE CROSS/BLUE SHIELD | Admitting: Physician Assistant

## 2017-01-09 ENCOUNTER — Encounter: Payer: Self-pay | Admitting: Physician Assistant

## 2017-01-09 VITALS — BP 124/80 | HR 82 | Temp 97.9°F | Resp 16 | Wt 158.6 lb

## 2017-01-09 DIAGNOSIS — M4127 Other idiopathic scoliosis, lumbosacral region: Secondary | ICD-10-CM

## 2017-01-09 DIAGNOSIS — M545 Low back pain, unspecified: Secondary | ICD-10-CM

## 2017-01-09 NOTE — Progress Notes (Signed)
Patient ID: Regina Richardson MRN: [id number], DOB: Dec 11, 1960, 56 y.o. Date of Encounter: @DATE @  Chief Complaint:  Chief Complaint  Patient presents with  . 6 routine f/u    HPI: 56 y.o. year old female  presents with above.   11/15/2016 OV--- she discussed low back pain as well as other issues. The parts of that OV note pertaining to back pain are copied below:  Regarding the back pain-- she does bring in a [facility] that [facility]/16. This paper does show that they prescribed prednisone ibuprofen 800 and cyclobenzaprine. She states that she took 1 dose of prednisone and it caused palpitations so she did not take any more prednisone. She took a half of one ibuprofen--didn't take any more. She never took any of the cyclobenzaprine. 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 Says that that day 11/06/16 she squatted down and then literally could not get back up. Says that she thinks that her low back went into a spasm. Called here but only could get voice mail so she went to an urgent care. Says that she has not worked since that episode. Says that she will not be able to work at all through that temp agency anymore now that this has happened.  AT THAT OV: Obtained XRay Lumbar spine. This showed some scoliosis and DDD.    ---11/22/2016: She states that she is continuing to have significant low back pain and points midline to low back around L3 down to L5. Says that she "can't bend, can't pick up anything "because develops burning pain in her mid low back when she tries to do these activities. Also says that she is having significant pain in that area at night and cannot sleep secondary to pain there. Says that she ends up having to prop herself on pillows in order  to get any rest. Says that when she is sitting, her back feels good but then when she tries to get up she has significant difficulty because of pain in that area. Having no pain, numbness, tingling down either leg right now. AT THAT OV: Ordered MRI and tramadol.  11/28/2016: Addendum was added on 11/28/16. Insurance denied MRI. Stated that she had to be on treatment and monitored for 6 weeks before they would consider covering MRI. At that time I added additional medications of Flexeril and Mobic and told her to schedule follow-up visit in 6 weeks.   01/09/2017: Today she reports that she has mostly been taking the medication just at night because it makes her so drowsy. Says that she has not been taking the medication during the day because she has to be able to function". Is that she has having back pain during the day but uses NSAIDs and Tylenol during the day and wakes to use the Flexeril at night. Is having no pain weakness tingling down either leg or foot. No weakness in the leg or foot. No incontinence of bowel or bladder. Is still emotionally upset/depressed because of her husband--- says now he is gonna be angry that he has to pay for this MRI. Past financial standpoint if she can leave him and she says that she cannot. Again recommend that she needs counseling/therapy but  says that she cannot do that because he will not pay for it and would be extremely angry if he gets a pill for that. However she has no money of her own to go pay for it.   Past Medical History:  Diagnosis Date  . Anxiety   . Depression      Home Meds: Outpatient Medications Prior to Visit  Medication Sig Dispense Refill  . buPROPion (WELLBUTRIN SR) 150 MG 12 hr tablet Take 1 tablet (150 mg total) by mouth 2 (two) times daily. 180 tablet 0  . clonazePAM (KLONOPIN) 0.5 MG tablet TAKE 1 TABLET BY MOUTH TWICE A DAY AS NEEDED 60 tablet 1  . cyclobenzaprine (FLEXERIL) 10 MG tablet Take 1 tablet (10 mg total) by mouth 3  (three) times daily as needed for muscle spasms. May cause drowsiness. If it causes drowsiness then only take 1(one) at night. 30 tablet 2  . ibuprofen (ADVIL,MOTRIN) 800 MG tablet Take 800 mg by mouth 3 (three) times daily as needed.  0  . meloxicam (MOBIC) 7.5 MG tablet Take 1 tablet (7.5 mg total) by mouth daily with breakfast. 90 tablet 0  . metoprolol succinate (TOPROL-XL) 50 MG 24 hr tablet Take 1 tablet (50 mg total) by mouth daily. Take with or immediately following a meal. 90 tablet 3  . traMADol (ULTRAM) 50 MG tablet Take 1 - 2 at night as needed for pain 60 tablet 0  . venlafaxine XR (EFFEXOR-XR) 150 MG 24 hr capsule TAKE ONE CAPSULE BY MOUTH IN THE MORNING W/BREAKFAST 90 capsule 0   No facility-administered medications prior to visit.     Allergies: No Known Allergies  Social History   Social History  . Marital status: Single    Spouse name: N/A  . Number of children: N/A  . Years of education: N/A   Occupational History  . Not on file.   Social History Main Topics  . Smoking status: Current Every Day Smoker    Packs/day: 0.50    Types: Cigarettes  . Smokeless tobacco: Never Used  . Alcohol use 3.6 oz/week    6 Cans of beer per week  . Drug use: No  . Sexual activity: Not Currently   Other Topics Concern  . Not on file   Social History Narrative  . No narrative on file    Family History  Problem Relation Age of Onset  . Family history unknown: Yes     Review of Systems:  See HPI for pertinent ROS. All other ROS negative.    Physical Exam: Blood pressure 124/80, pulse 82, temperature 97.9 F (36.6 C), temperature source Oral, resp. rate 16, weight 158 lb 9.6 oz (71.9 kg), SpO2 99 %., Body mass index is 29.01 kg/m. General: WNWD WF. Appears in no acute distress. Neck: Supple. No thyromegaly. No lymphadenopathy. Lungs: Clear bilaterally to auscultation without wheezes, rales, or rhonchi. Breathing is unlabored. Heart: RRR with S1 S2. No murmurs, rubs,  or gallops. Musculoskeletal:  Strength and tone normal for age. She has pain near midline at ~ L3 - L5 levels.  SLR and hip abduction --bilaterally--causes increased pain in this area Extremities/Skin: Warm and dry. Neuro: Alert and oriented X 3. Moves all extremities spontaneously. Gait is normal. CNII-XII grossly in tact. Psych: She cries through last 3/4 of visit.     ASSESSMENT AND PLAN:  56 y.o. year old female with   1. Midline low back pain without sciatica, unspecified chronicity - MR Lumbar Spine Wo Contrast; Future  2. Other idiopathic scoliosis, lumbosacral region - MR Lumbar Spine Wo Contrast; Future  Will obtain MRI. Will have her schedule follow-up office visit in one month. As well I will follow up with her once I get MRI report.  Marin Olp Stokes, [place], [facility] 01/09/2017 8:26 AM

## 2017-01-15 ENCOUNTER — Telehealth: Payer: Self-pay

## 2017-01-15 NOTE — Telephone Encounter (Signed)
Pt aware MRI was denied and has made an appt to see PCP

## 2017-01-17 ENCOUNTER — Encounter: Payer: Self-pay | Admitting: Physician Assistant

## 2017-01-17 ENCOUNTER — Ambulatory Visit (INDEPENDENT_AMBULATORY_CARE_PROVIDER_SITE_OTHER): Payer: BLUE CROSS/BLUE SHIELD | Admitting: Physician Assistant

## 2017-01-17 VITALS — BP 122/80 | HR 82 | Temp 97.9°F | Resp 16 | Wt 160.8 lb

## 2017-01-17 DIAGNOSIS — M25649 Stiffness of unspecified hand, not elsewhere classified: Secondary | ICD-10-CM | POA: Diagnosis not present

## 2017-01-17 DIAGNOSIS — M4126 Other idiopathic scoliosis, lumbar region: Secondary | ICD-10-CM

## 2017-01-17 DIAGNOSIS — M25579 Pain in unspecified ankle and joints of unspecified foot: Secondary | ICD-10-CM | POA: Diagnosis not present

## 2017-01-17 DIAGNOSIS — M5440 Lumbago with sciatica, unspecified side: Secondary | ICD-10-CM | POA: Diagnosis not present

## 2017-01-17 DIAGNOSIS — M25511 Pain in right shoulder: Secondary | ICD-10-CM

## 2017-01-17 DIAGNOSIS — M25512 Pain in left shoulder: Secondary | ICD-10-CM

## 2017-01-17 LAB — CBC WITH DIFFERENTIAL/PLATELET
Basophils Absolute: 96 cells/uL (ref 0–200)
Basophils Relative: 1 %
Eosinophils Absolute: 96 cells/uL (ref 15–500)
Eosinophils Relative: 1 %
HCT: 39.3 % (ref 35.0–45.0)
Hemoglobin: 13.1 g/dL (ref 12.0–15.0)
Lymphocytes Relative: 23 %
Lymphs Abs: 2208 cells/uL (ref 850–3900)
MCH: 31.4 pg (ref 27.0–33.0)
MCHC: 33.3 g/dL (ref 32.0–36.0)
MCV: 94.2 fL (ref 80.0–100.0)
MPV: 8.9 fL (ref 7.5–12.5)
Monocytes Absolute: 768 cells/uL (ref 200–950)
Monocytes Relative: 8 %
Neutro Abs: 6432 cells/uL (ref 1500–7800)
Neutrophils Relative %: 67 %
Platelets: 369 10*3/uL (ref 140–400)
RBC: 4.17 MIL/uL (ref 3.80–5.10)
RDW: 12.7 % (ref 11.0–15.0)
WBC: 9.6 10*3/uL (ref 3.8–10.8)

## 2017-01-17 LAB — TSH: TSH: 1.19 mIU/L

## 2017-01-17 NOTE — Progress Notes (Signed)
Patient ID: Regina Richardson MRN: [id number], DOB: 1960-10-26, 56 y.o. Date of Encounter: @DATE @  Chief Complaint:  Chief Complaint  Patient presents with  . discuss MRI that was denied    HPI: 56 y.o. year old female  presents with above.   11/15/2016 OV--- she discussed low back pain as well as other issues. The parts of that OV note pertaining to back pain are copied below:  Regarding the back pain-- she does bring in a [facility] that [facility]/16. This paper does show that they prescribed prednisone ibuprofen 800 and cyclobenzaprine. She states that she took 1 dose of prednisone and it caused palpitations so she did not take any more prednisone. She took a half of one ibuprofen--didn't take any more. She never took any of the cyclobenzaprine. 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 Says that that day 11/06/16 she squatted down and then literally could not get back up. Says that she thinks that her low back went into a spasm. Called here but only could get voice mail so she went to an urgent care. Says that she has not worked since that episode. Says that she will not be able to work at all through that temp agency anymore now that this has happened.  AT THAT OV: Obtained XRay Lumbar spine. This showed some scoliosis and DDD.    ---11/22/2016: She states that she is continuing to have significant low back pain and points midline to low back around L3 down to L5. Says that she "can't bend, can't pick up anything "because develops burning pain in her mid low back when she tries to do these activities. Also says that she is having significant pain in that area at night and cannot sleep secondary to pain there. Says that she ends up having to prop herself on  pillows in order to get any rest. Says that when she is sitting, her back feels good but then when she tries to get up she has significant difficulty because of pain in that area. Having no pain, numbness, tingling down either leg right now. AT THAT OV: Ordered MRI and tramadol.  11/28/2016: Addendum was added on 11/28/16. Insurance denied MRI. Stated that she had to be on treatment and monitored for 6 weeks before they would consider covering MRI. At that time I added additional medications of Flexeril and Mobic and told her to schedule follow-up visit in 6 weeks.   01/09/2017: Today she reports that she has mostly been taking the medication just at night because it makes her so drowsy. Says that she has not been taking the medication during the day because she has to be able to function". Is that she has having back pain during the day but uses NSAIDs and Tylenol during the day and wakes to use the Flexeril at night. Is having no pain weakness tingling down either leg or foot. No weakness in the leg or foot. No incontinence of bowel or bladder. Is still emotionally upset/depressed because of her husband--- says now he is gonna be angry that he has to pay for this MRI. Past financial standpoint if she can leave him and she says that she cannot. Again recommend that she needs  counseling/therapy but says that she cannot do that because he will not pay for it and would be extremely angry if he gets a pill for that. However she has no money of her own to go pay for it.   01/17/2017: At last office visit already ordered MRI. Subsequently got a note from referral staff that the MRI again was denied that they did put down a number that could be called for a peer-to-peer review. Patient comes in for follow-up because she was informed MRI denied. I reviewed that she really had not been documented up taking much medication/treatment for back pain. Explained to her that we need to document that she has tried and  failed possible conservative therapies prior to obtaining MRI. I reviewed that on 11/15/16 she was told to use prednisone but that caused palpitations so she only took one dose and that had to be discontinued. 11/15/16 also was told to use ibuprofen 800 and cyclobenzaprine but she was afraid to use those without checking with me so never really use those. 11/22/16 added tramadol. 11/28/16 added Flexeril and Modic. 01/09/17 she said that during the day she was using NSAID and Tylenol and at night was using the Flexeril. That caused drowsiness only was taking that at night. 01/17/17--- today she states that she is taking the Flexeril at night. She is taking the meloxicam every morning with breakfast. She only uses the tramadol occasionally. ---------- she states that she was scared to take all of these medicines together and didn't know that she that it was safe to take them together. 01/17/17--today she also says that every morning she is waking up with her hands stiff and achy. Says that it takes her several minutes to get her hands to where they will open and stretch. Says that she is also having pain in her neck and her feet, her ankles, her legs in addition to the back pain. Also says that she has had surgery to the left shoulder in the past--and ever since then has had very little use of the left arm--- that arm is very weak and can lift very little weight. --- Is having no pain numbness or tingling down the lateral aspect of either leg.   Past Medical History:  Diagnosis Date  . Anxiety   . Depression      Home Meds: Outpatient Medications Prior to Visit  Medication Sig Dispense Refill  . buPROPion (WELLBUTRIN SR) 150 MG 12 hr tablet Take 1 tablet (150 mg total) by mouth 2 (two) times daily. 180 tablet 0  . clonazePAM (KLONOPIN) 0.5 MG tablet TAKE 1 TABLET BY MOUTH TWICE A DAY AS NEEDED 60 tablet 1  . cyclobenzaprine (FLEXERIL) 10 MG tablet Take 1 tablet (10 mg total) by mouth 3 (three) times  daily as needed for muscle spasms. May cause drowsiness. If it causes drowsiness then only take 1(one) at night. 30 tablet 2  . ibuprofen (ADVIL,MOTRIN) 800 MG tablet Take 800 mg by mouth 3 (three) times daily as needed.  0  . meloxicam (MOBIC) 7.5 MG tablet Take 1 tablet (7.5 mg total) by mouth daily with breakfast. 90 tablet 0  . metoprolol succinate (TOPROL-XL) 50 MG 24 hr tablet Take 1 tablet (50 mg total) by mouth daily. Take with or immediately following a meal. 90 tablet 3  . traMADol (ULTRAM) 50 MG tablet Take 1 - 2 at night as needed for pain 60 tablet 0  . venlafaxine XR (EFFEXOR-XR) 150 MG 24 hr capsule TAKE  ONE CAPSULE BY MOUTH IN THE MORNING W/BREAKFAST 90 capsule 0   No facility-administered medications prior to visit.     Allergies: No Known Allergies  Social History   Social History  . Marital status: Single    Spouse name: N/A  . Number of children: N/A  . Years of education: N/A   Occupational History  . Not on file.   Social History Main Topics  . Smoking status: Current Every Day Smoker    Packs/day: 0.50    Types: Cigarettes  . Smokeless tobacco: Never Used  . Alcohol use 3.6 oz/week    6 Cans of beer per week  . Drug use: No  . Sexual activity: Not Currently   Other Topics Concern  . Not on file   Social History Narrative  . No narrative on file    Family History  Problem Relation Age of Onset  . Family history unknown: Yes     Review of Systems:  See HPI for pertinent ROS. All other ROS negative.    Physical Exam: Blood pressure 122/80, pulse 82, temperature 97.9 F (36.6 C), temperature source Oral, resp. rate 16, weight 160 lb 12.8 oz (72.9 kg), SpO2 98 %., Body mass index is 29.41 kg/m. General: WNWD WF. Appears in no acute distress. Neck: Supple. No thyromegaly. No lymphadenopathy. Lungs: Clear bilaterally to auscultation without wheezes, rales, or rhonchi. Breathing is unlabored. Heart: RRR with S1 S2. No murmurs, rubs, or  gallops. Musculoskeletal:  Strength and tone normal for age. She has pain bilaterally at ~ L3 - L5 levels.  Left SLR and hip abduction --both of these cause some discomfort in low back Right SLR--causes some discomfort in low back Right Hip Abduction--causes pain in right low back "all the way up to right shoulder and neck" She does have nodules on joints of some of her fingers. No sign of swan-neck deformity or boutonniere deformity. Extremities/Skin: Warm and dry. Neuro: Alert and oriented X 3. Moves all extremities spontaneously. Gait is normal. CNII-XII grossly in tact. Psych: She is not crying any during today's visit. Appropriate affect     ASSESSMENT AND PLAN:  56 y.o. year old female with   1. Acute bilateral low back pain with sciatica, sciatica laterality unspecified - CBC with Differential/Platelet - COMPLETE METABOLIC PANEL WITH GFR - TSH - Sedimentation rate - CK - Rheumatoid factor - ANA  2. Other idiopathic scoliosis, lumbar region  3. Morning joint stiffness of hand, unspecified laterality - CBC with Differential/Platelet - COMPLETE METABOLIC PANEL WITH GFR - TSH - Sedimentation rate - CK - Rheumatoid factor - ANA  4. Pain of both shoulder joints - CBC with Differential/Platelet - COMPLETE METABOLIC PANEL WITH GFR - TSH - Sedimentation rate - CK - Rheumatoid factor - ANA  5. Joint pain of ankle and foot, unspecified laterality - CBC with Differential/Platelet - COMPLETE METABOLIC PANEL WITH GFR - TSH - Sedimentation rate - CK - Rheumatoid factor - ANA  Will Check labs to evaluate for any underlying systemic illness as cause of her diffuse symptoms. However nodes on exam of her hands is more consistent of osteoarthritis versus rheumatoid arthritis.  Also I have informed her to take her medications as follows: Take the Flexeril every night----this causes drowsiness so cannot take during the day Take the meloxicam every morning with  breakfast Take the tramadol 1-2 every 8 hours for pain----go ahead and use this routinely throughout the day if experiencing pain  She cannot tolerate prednisone as this  caused palpitations after just 1 dose  I will follow-up with her once I get lab results. If lab results are negative and she is still having significant back pain after she is taking medications as directed above, then I will do Peer-to-Peer to get MRI.   Signed, [address] [place], [place], [facility] 01/17/2017 11:53 AM

## 2017-01-18 LAB — COMPLETE METABOLIC PANEL WITH GFR
ALT: 38 U/L — ABNORMAL HIGH (ref 6–29)
AST: 36 U/L — ABNORMAL HIGH (ref 10–35)
Albumin: 4.3 g/dL (ref 3.6–5.1)
Alkaline Phosphatase: 73 U/L (ref 33–130)
BUN: 7 mg/dL (ref 7–25)
CO2: 20 mmol/L (ref 20–31)
Calcium: 9.6 mg/dL (ref 8.6–10.4)
Chloride: 99 mmol/L (ref 98–110)
Creat: 0.54 mg/dL (ref 0.50–1.05)
GFR, Est African American: >89 mL/min (ref >=60)
GFR, Est Non African American: >89 mL/min (ref >=60)
Glucose, Bld: 101 mg/dL — ABNORMAL HIGH (ref 70–99)
Potassium: 4.3 mmol/L (ref 3.5–5.3)
Sodium: 133 mmol/L — ABNORMAL LOW (ref 135–146)
Total Bilirubin: 0.2 mg/dL (ref 0.2–1.2)
Total Protein: 7.2 g/dL (ref 6.1–8.1)

## 2017-01-18 LAB — RHEUMATOID FACTOR: Rhuematoid fact SerPl-aCnc: <14 IU/mL (ref <14)

## 2017-01-18 LAB — ANA: Anti Nuclear Antibody(ANA): NEGATIVE

## 2017-01-18 LAB — CK: Total CK: 52 U/L (ref 29–143)

## 2017-01-18 LAB — SEDIMENTATION RATE: Sed Rate: 11 mm/hr (ref 0–30)

## 2017-01-21 ENCOUNTER — Telehealth: Payer: Self-pay | Admitting: *Deleted

## 2017-01-21 DIAGNOSIS — M4127 Other idiopathic scoliosis, lumbosacral region: Secondary | ICD-10-CM

## 2017-01-21 DIAGNOSIS — M545 Low back pain, unspecified: Secondary | ICD-10-CM

## 2017-01-21 NOTE — Telephone Encounter (Signed)
Regina Richardson phoned [employer] for peer to peer and they stated it had been authorized and gave her number: [phone]

## 2017-02-08 ENCOUNTER — Ambulatory Visit
Admission: RE | Admit: 2017-02-08 | Discharge: 2017-02-08 | Disposition: A | Discharge status: Home / Self Care | Payer: BLUE CROSS/BLUE SHIELD | Source: Ambulatory Visit | Attending: Physician Assistant | Admitting: Physician Assistant

## 2017-02-08 DIAGNOSIS — M545 Low back pain, unspecified: Secondary | ICD-10-CM

## 2017-02-08 DIAGNOSIS — M5126 Other intervertebral disc displacement, lumbar region: Secondary | ICD-10-CM | POA: Diagnosis not present

## 2017-02-08 DIAGNOSIS — M4127 Other idiopathic scoliosis, lumbosacral region: Secondary | ICD-10-CM

## 2017-02-10 ENCOUNTER — Other Ambulatory Visit: Payer: Self-pay | Admitting: Physician Assistant

## 2017-02-11 ENCOUNTER — Other Ambulatory Visit: Payer: Self-pay | Admitting: *Deleted

## 2017-02-11 DIAGNOSIS — M5136 Other intervertebral disc degeneration, lumbar region: Secondary | ICD-10-CM

## 2017-02-11 NOTE — Telephone Encounter (Signed)
Ok to refill 

## 2017-02-11 NOTE — Telephone Encounter (Signed)
ok 

## 2017-02-26 ENCOUNTER — Telehealth: Payer: Self-pay

## 2017-02-26 DIAGNOSIS — Z1231 Encounter for screening mammogram for malignant neoplasm of breast: Secondary | ICD-10-CM

## 2017-02-26 NOTE — Telephone Encounter (Signed)
Patient received a letter that she was due for a mammogram. I put in the referral for patient to have the mammogram done. Pt is aware

## 2017-03-08 ENCOUNTER — Ambulatory Visit
Admission: RE | Admit: 2017-03-08 | Discharge: 2017-03-08 | Disposition: A | Discharge status: Home / Self Care | Payer: BLUE CROSS/BLUE SHIELD | Source: Ambulatory Visit | Attending: Physician Assistant | Admitting: Physician Assistant

## 2017-03-08 DIAGNOSIS — Z1231 Encounter for screening mammogram for malignant neoplasm of breast: Secondary | ICD-10-CM

## 2017-03-13 ENCOUNTER — Other Ambulatory Visit: Payer: Self-pay | Admitting: Physician Assistant

## 2017-03-14 NOTE — Telephone Encounter (Signed)
Refill appropriate 

## 2017-03-20 ENCOUNTER — Ambulatory Visit (INDEPENDENT_AMBULATORY_CARE_PROVIDER_SITE_OTHER): Payer: BLUE CROSS/BLUE SHIELD | Admitting: Orthopaedic Surgery

## 2017-03-20 ENCOUNTER — Encounter (INDEPENDENT_AMBULATORY_CARE_PROVIDER_SITE_OTHER): Payer: Self-pay | Admitting: Orthopaedic Surgery

## 2017-03-20 VITALS — BP 134/73 | HR 77 | Ht 60.0 in | Wt 150.0 lb

## 2017-03-20 DIAGNOSIS — M545 Low back pain, unspecified: Secondary | ICD-10-CM

## 2017-03-20 NOTE — Addendum Note (Signed)
Addended by: Meyer Cory on: 03/20/2017 11:57 AM   Modules accepted: Orders

## 2017-03-20 NOTE — Progress Notes (Signed)
Office Visit Note   Patient: Regina Richardson           Date of Birth: 30-Aug-1960           MRN: [id number] Visit Date: 03/20/2017              Requested by: Orlena Sheldon, PA-C [address] [place], [place] [address] PCP: Orlena Sheldon, PA-C   Assessment & Plan: Visit Diagnoses:  1. Midline low back pain without sciatica, unspecified chronicity     Plan: Patient's MRI shows significant for placed in the paraspinal dorsal musculature with fat with some overall muscle atrophy. She has core weakness is deconditioned. I recommend some physical therapy to improve her mobility. She may have some further bulging of the disc at L4-5 and she does have some mild to moderate central and subarticular narrowing. She would benefit from weight loss and muscle strengthening to improve her mobility. Plan recheck her back in 7 weeks.  Follow-Up Instructions: Return in about 7 weeks (around 05/08/2017).   Orders:  No orders of the defined types were placed in this encounter.  No orders of the defined types were placed in this encounter.     Procedures: No procedures performed   Clinical Data: No additional findings.   Subjective: Chief Complaint  Patient presents with  . Lower Back - Pain    HPI 56 year old female with back pain 2 years. She states she has difficulty walking more than a block assist. Sit and rest. She has difficulty when she bends over with back pain. Problems getting back to extension. When she is in the bathtub she has to get over on her knees and hands in order to get out of the bathtub. She's been on meloxicam and Flexeril. She's not been draining therapy and she states she's not sure she really believes in therapy.  Review of Systems this is some possible anxiety depression hypertension insomnia smoking history she states she'll he smokes about 2 cigarettes a day, mild the scoliosis.   Objective: Vital Signs: BP 134/73   Pulse 77   Ht 5' (1.524 m)   Wt 150  lb (68 kg)   BMI 29.29 kg/m   Physical Exam  Constitutional: She is oriented to person, place, and time. She appears well-developed.  HENT:  Head: Normocephalic.  Right Ear: External ear normal.  Left Ear: External ear normal.  Eyes: Pupils are equal, round, and reactive to light.  Neck: No tracheal deviation present. No thyromegaly present.  Cardiovascular: Normal rate.   Pulmonary/Chest: Effort normal.  Abdominal: Soft.  Musculoskeletal:  Patient has normal hip range of motion. She slowed to get from sitting to standing uses her hands on the armrests. Slow getting off the exam table has a bit of trouble healing toe walking more than a few steps. Distal pulses are intact no pain with hip internal and external rotation quads are strong gastrocsoleus are strong. Subtendinous fluid collection with distal lateral leg above the ankle which is nontender. Anterior tib EHL peroneal posterior tib are all strong. Patient not able to a single  sit up.  Neurological: She is alert and oriented to person, place, and time.  Skin: Skin is warm and dry.  Psychiatric: She has a normal mood and affect. Her behavior is normal.    Ortho Exam  Specialty Comments:  No specialty comments available.  Imaging: Contrast  Study Result   CLINICAL DATA:  Low back pain and bilateral leg pain.  EXAM: MRI LUMBAR SPINE WITHOUT CONTRAST  TECHNIQUE: Multiplanar, multisequence MR imaging of the lumbar spine was performed. No intravenous contrast was administered.  COMPARISON:  11/15/2016  FINDINGS: Segmentation:  Standard.  Alignment:  Physiologic.  Vertebrae:  No fracture, evidence of discitis, or bone lesion.  Conus medullaris: Extends to the T12-L1 level and appears normal.  Paraspinal and other soft tissues: 2.1 cm nodule in the left adrenal gland, most likely a benign adenoma. Otherwise negative.  Disc levels:  L1-2: Minimal disc bulge to the right of midline with no  neural impingement.  L2-3: Minimal disc bulge to the right of midline with no neural impingement.  L3-4:  Small broad-based disc bulge without neural impingement.  L4-5: Small broad-based disc bulge slightly asymmetric to the left with slight narrowing of the left lateral recess which might affect the left L5 nerve. Slight thickening of the ligamentum flavum.  L5-S1:  Tiny central disc bulge with no neural impingement.  IMPRESSION: Minimal degenerative disc disease in the lumbar spine. Slight lateral recess narrowing at L4-5 which could affect the left L5 nerve.   Electronically Signed   By: Lorriane Shire M.D.   On: 02/08/2017 12:16       PMFS History: Patient Active Problem List   Diagnosis Date Noted  . Midline low back pain 11/22/2016  . Scoliosis 11/22/2016  . Smoker 09/07/2015  . Essential hypertension 05/25/2015  . Insomnia 05/25/2015  . Anxiety   . Depression    Past Medical History:  Diagnosis Date  . Anxiety   . Depression     Family History  Problem Relation Age of Onset  . Breast cancer Neg Hx     Past Surgical History:  Procedure Laterality Date  . ABDOMINAL HYSTERECTOMY     still has ovaries   Social History   Occupational History  . Not on file.   Social History Main Topics  . Smoking status: Current Every Day Smoker    Packs/day: 0.50    Types: Cigarettes  . Smokeless tobacco: Never Used  . Alcohol use 3.6 oz/week    6 Cans of beer per week  . Drug use: No  . Sexual activity: Not Currently

## 2017-03-22 ENCOUNTER — Other Ambulatory Visit: Payer: Self-pay | Admitting: Physician Assistant

## 2017-03-22 DIAGNOSIS — M4186 Other forms of scoliosis, lumbar region: Secondary | ICD-10-CM

## 2017-03-22 DIAGNOSIS — M545 Low back pain, unspecified: Secondary | ICD-10-CM

## 2017-03-22 NOTE — Telephone Encounter (Signed)
Ok to refill 

## 2017-03-25 NOTE — Telephone Encounter (Signed)
Approved # [id number] 

## 2017-04-02 ENCOUNTER — Encounter: Payer: Self-pay | Admitting: Physical Therapy

## 2017-04-02 ENCOUNTER — Ambulatory Visit: Payer: BLUE CROSS/BLUE SHIELD | Attending: Physician Assistant | Admitting: Physical Therapy

## 2017-04-02 DIAGNOSIS — R262 Difficulty in walking, not elsewhere classified: Secondary | ICD-10-CM

## 2017-04-02 DIAGNOSIS — M6281 Muscle weakness (generalized): Secondary | ICD-10-CM | POA: Diagnosis not present

## 2017-04-02 DIAGNOSIS — R293 Abnormal posture: Secondary | ICD-10-CM | POA: Diagnosis not present

## 2017-04-02 DIAGNOSIS — M5440 Lumbago with sciatica, unspecified side: Secondary | ICD-10-CM

## 2017-04-02 NOTE — Patient Instructions (Signed)
   Elbow Prop (Extension)   Prop body up on elbows for 2 minutes . Slowly lower it. Repeat  1__ times. Do __2__ sessions per day.  Extension   .  Lie face down, hands close to chest. Press trunk up, arching back. Keep neck long, shoulders down. Tighten buttocks to protect lower back. Press up 5 times . Do _2-3___ sessions per day.  Backward Bend (Standing)   Arch backward to make hollow of back deeper. Hold _5___ seconds. Every hour that you are up, remember to do 5 times.  To avoid flexed position           Kneel, buttocks on heels. Fold upper body forward from hips. Then reach to each side as far as possible, keeping chest low toward floor. Hold each position _30__ seconds. Repeat __3_ times per session. Do __2_ sessions per day.  Copyright  VHI. All rights reserved.   Regina Richardson, PT Certified Exercise Expert for the Aging Adult  04/02/17 9:24 AM Phone: [phone] Fax: [phone]

## 2017-04-02 NOTE — Therapy (Signed)
[place], [place], [address] Phone: [phone]   Fax:  [phone]  Physical Therapy Evaluation  Patient Details  Name: Regina Richardson MRN: [id number] Date of Birth: January 22, 1961 Referring Provider: Rodell Perna MD  Encounter Date: 04/02/2017      PT End of Session - 04/02/17 0958    Visit Number 1   Number of Visits 12   Date for PT Re-Evaluation 05/14/17   Authorization Type [employer]   PT Start Time 0845   PT Stop Time 0940   PT Time Calculation (min) 55 min   Activity Tolerance Patient tolerated treatment well;Patient limited by pain   Behavior During Therapy [facility] for tasks assessed/performed      Past Medical History:  Diagnosis Date  . Anxiety   . Depression     Past Surgical History:  Procedure Laterality Date  . ABDOMINAL HYSTERECTOMY     still has ovaries    There were no vitals filed for this visit.       Subjective Assessment - 04/02/17 0851    Subjective Back in April and she was doing temporary work. 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  Pain has been increasing since April.     Pertinent History Depression, Anxiety. Scoliosis, Low back pain.  bil arm pain as per pt   Limitations Sitting;Lifting;Standing;Walking;House hold activities   How long can you sit comfortably? 10-15 minutes, especially if I am in my couch   How long can you stand comfortably? 10 minutes   How long can you walk comfortably? 68minutes   Diagnostic tests Minimal degenerative disc disease in the lumbar spine. Slightlateral recess narrowing at L4-5 which could affect the left L5 xray, Mild scoliosis concave left. Diffuse degenerative change. No   Patient Stated Goals Get rid of   Currently in Pain? Yes   Pain Score 9    Pain Location Back   Pain Orientation Mid   Pain Descriptors / Indicators Throbbing;Aching;Spasm   Pain Type Chronic pain   Pain Onset More than a month ago   Pain Frequency Constant   Aggravating Factors  when bending over or picking up something from the ground, driving for longer than 15 minutes  , I fidget   Pain Relieving Factors heat , medicine,  after eval,  extending my back.            [facility] PT Assessment - 04/02/17 0851      Assessment   Medical Diagnosis low back midline pain   Referring Provider Rodell Perna MD   Onset Date/Surgical Date --  Started getting worse April 2018   Hand Dominance Right   Next MD Visit 6 weeks   Prior Therapy none for back     Precautions   Precautions None     Restrictions   Weight Bearing Restrictions No     Balance Screen   Has the patient fallen in the past 6 months No   Has the patient had a decrease in activity level because of a fear of falling?  No   Is the patient reluctant to leave their home because of a fear of falling?  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  left pelvis elevated over right     AROM   Overall AROM  Deficits   Lumbar Flexion 35  pain coming back to standing after flexion   Lumbar  Extension 10  ERP   Lumbar - Right Side Bend 14  ERP   Lumbar - Left Side Bend 10  ERP   Lumbar - Right Rotation 50%   Lumbar - Left Rotation 50%     Strength   Overall Strength Deficits   Overall Strength Comments difficutl to assess due to 9/10 pain and diffitcult to properly resisit   Right Hip Flexion 4/5   Right Hip ABduction 4/5   Left Hip Flexion 4/5   Left Hip ABduction 4/5   Right Knee Flexion 4/5   Right Knee Extension 4/5   Left Knee Flexion 4/5   Left Knee Extension 4/5     Flexibility   Hamstrings Pt with increased tightness on left hamstring vx right     Palpation   Palpation comment hypomobility of thoracic and lumbar spine, tenderness over bil Quadrauts lumborum      Slump test   Findings Positive   Side Left   Comment Pt with increased tightness and pain in right low back     Transfers   Comments Pt with gaurded movements with transitional movements     Ambulation/Gait   Ambulation Distance (Feet) 150 Feet   Assistive device None   Gait Pattern Decreased stride length  decreased arm swing   Ambulation Surface Level   Gait velocity 2.15 ft/sec   very gaurded movement            Objective measurements completed on examination: See above findings.          [facility] Adult PT Treatment/Exercise - 04/02/17 0851      Self-Care   Self-Care Other Self-Care Comments   Other Self-Care Comments  Explanation of findings. importance of posture in daily life brief explanation of  flexion and increase of radicular pain     Lumbar Exercises: Stretches   Quadruped Mid Back Stretch 30 seconds   Quadruped Mid Back Stretch Limitations forward and left and right x 2 each for 30 seconds     Lumbar Exercises: Standing   Other Standing Lumbar Exercises standing Mckenzie extesnion 5 sec hold x 10   pain decreases with repetitive movement     Lumbar Exercises: Prone   Other Prone Lumbar Exercises prone on elbow for 2 minutes   Other Prone Lumbar Exercises  Prone press up x 5 5 sec hold                PT Education - 04/02/17 0924    Education provided Yes   Education Details POC Explanation of findings  initial childs pose and initial Mckenzie extension   Person(s) Educated Patient  Methods Explanation;Demonstration;Tactile cues;Verbal cues;Handout   Comprehension Verbalized understanding;Returned demonstration          PT Short Term Goals - 04/02/17 0959      PT SHORT TERM GOAL #1   Title STG=LTG           PT Long Term Goals - 04/02/17 0959      PT LONG TERM GOAL #1   Title "Demonstrate and verbalize techniques to reduce the risk of re-injury including: lifting, posture, body mechanics especially when bending over for possible temp work   Time 6   Period Weeks   Status New   Target Date 05/14/17     PT LONG TERM GOAL #2   Title "Pt will be independent with advanced HEP   Time 6   Period Weeks   Status New   Target Date 05/14/17     PT LONG TERM GOAL #3   Title "Pt will tolerate sitting 1 hour without increased pain to ride in car without increased pain   Time 6   Period Weeks   Status New   Target Date 05/14/17     PT LONG TERM GOAL #4   Title "Pt will tolerate walking for 1 mile without increased pain in order to return to PLOF and possible work   Time 6   Period Weeks   Status New   Target Date 05/14/17     PT LONG TERM GOAL #5   Title "FOTO will improve from  78% limitation  to  59% limitation   indicating improved functional mobility.    Time 6   Status New   Target Date 05/14/17                Plan - 04/02/17 0948    Clinical Impression Statement 56 yo female with increasing back pain since April 2018. 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 . Pt presents with signs and symptoms compatible with lumbar radiculopathy  due to disc derangement.  Pt reponded initial with 9/10 pain and then down to 2/10 pain post treatment with McKenzie exercises Pt would benefit from skilled PT for 2 times a week for 6 weeks to address above impariments and functional limitations and return to reduced pain PLOF.  Pt  is concerned about finances and would like to learn a home program as quickly as possible to use at home.   History and Personal Factors relevant to plan of care: Depression , anxiety, scoliosis, back pain,  bil arm pain as per pt   Clinical Presentation Evolving   Clinical Decision Making Moderate   Rehab Potential Good   PT Frequency 2x / week   PT Duration 6 weeks   PT Treatment/Interventions Iontophoresis 4mg /ml Dexamethasone;Electrical Stimulation;Cryotherapy;Moist Heat;Traction;Ultrasound;Functional mobility training;Patient/family education;Neuromuscular re-education;Therapeutic exercise;Therapeutic activities;Manual techniques;Passive range of motion;Taping;Dry needling   PT Next Visit Plan Go over basic posture. body mechanics.  Review initial HEP,  Start core, basic back as able.     PT Home Exercise Plan childs pose, Mckenzie standiing extension,  prone on elbows for 29minutes and prone press ups   Consulted and Agree with Plan of Care Patient      Patient will benefit from skilled therapeutic intervention in order to improve the following deficits and impairments:  Difficulty walking, Pain, Postural dysfunction, Improper body mechanics, Increased muscle spasms,  Increased fascial restricitons, Hypomobility, Decreased strength, Decreased mobility, Decreased range of motion  Visit Diagnosis: Bilateral low back pain with sciatica, sciatica laterality unspecified, unspecified chronicity  Abnormal posture  Difficulty in walking, not elsewhere classified  Muscle weakness (generalized)     Problem List Patient Active Problem List   Diagnosis Date Noted  . Midline low back pain 11/22/2016  . Scoliosis  11/22/2016  . Smoker 09/07/2015  . Essential hypertension 05/25/2015  . Insomnia 05/25/2015  . Anxiety   . Depression    Voncille Lo, PT Certified Exercise Expert for the Aging Adult  04/02/17 11:33 AM Phone: [phone] Fax: [facility] [address] [place], [place], [address] Phone: [phone]   Fax:  [phone]  Name: Regina Richardson MRN: [id number] Date of Birth: 1960/10/17

## 2017-04-04 ENCOUNTER — Ambulatory Visit: Payer: BLUE CROSS/BLUE SHIELD | Admitting: Physical Therapy

## 2017-04-08 ENCOUNTER — Encounter: Payer: BLUE CROSS/BLUE SHIELD | Admitting: Physical Therapy

## 2017-04-11 ENCOUNTER — Ambulatory Visit: Payer: BLUE CROSS/BLUE SHIELD | Admitting: Physical Therapy

## 2017-04-11 ENCOUNTER — Encounter: Payer: Self-pay | Admitting: Physical Therapy

## 2017-04-11 DIAGNOSIS — M6281 Muscle weakness (generalized): Secondary | ICD-10-CM | POA: Diagnosis not present

## 2017-04-11 DIAGNOSIS — R293 Abnormal posture: Secondary | ICD-10-CM | POA: Diagnosis not present

## 2017-04-11 DIAGNOSIS — M5440 Lumbago with sciatica, unspecified side: Secondary | ICD-10-CM

## 2017-04-11 DIAGNOSIS — R262 Difficulty in walking, not elsewhere classified: Secondary | ICD-10-CM | POA: Diagnosis not present

## 2017-04-11 NOTE — Patient Instructions (Addendum)
Trigger Point Dry Needling  . What is Trigger Point Dry Needling (DN)? o DN is a physical therapy technique used to treat muscle pain and dysfunction. Specifically, DN helps deactivate muscle trigger points (muscle knots).  o A thin filiform needle is used to penetrate the skin and stimulate the underlying trigger point. The goal is for a local twitch response (LTR) to occur and for the trigger point to relax. No medication of any kind is injected during the procedure.   . What Does Trigger Point Dry Needling Feel Like?  o The procedure feels different for each individual patient. Some patients report that they do not actually feel the needle enter the skin and overall the process is not painful. Very mild bleeding may occur. However, many patients feel a deep cramping in the muscle in which the needle was inserted. This is the local twitch response.   Marland Kitchen How Will I feel after the treatment? o Soreness is normal, and the onset of soreness may not occur for a few hours. Typically this soreness does not last longer than two days.  o Bruising is uncommon, however; ice can be used to decrease any possible bruising.  o In rare cases feeling tired or nauseous after the treatment is normal. In addition, your symptoms may get worse before they get better, this period will typically not last longer than 24 hours.   . What Can I do After My Treatment? o Increase your hydration by drinking more water for the next 24 hours. o You may place ice or heat on the areas treated that have become sore, however, do not use heat on inflamed or bruised areas. Heat often brings more relief post needling. o You can continue your regular activities, but vigorous activity is not recommended initially after the treatment for 24 hours. o DN is best combined with other physical therapy such as strengthening, stretching, and other therapies.   Pt given qkuadratus Lumborum handout for sidelying stretch for 5 minutes on left and  then right with pillow under torso and top leg extended.   Voncille Lo, PT Certified Exercise Expert for the Aging Adult  04/11/17 11:20 AM Phone: [phone] Fax: [phone]

## 2017-04-11 NOTE — Therapy (Signed)
[place], [place], [address] Phone: [phone]   Fax:  [phone]  Physical Therapy Treatment  Patient Details  Name: Regina Richardson MRN: [id number] Date of Birth: [birth year] Referring Provider: Rodell Perna MD  Encounter Date: 04/11/2017      PT End of Session - 04/11/17 1253    Visit Number 2   Number of Visits 12   Date for PT Re-Evaluation 05/14/17   Authorization Type BCBS   PT Start Time 1101   PT Stop Time 1158   PT Time Calculation (min) 57 min   Activity Tolerance Patient tolerated treatment well;Patient limited by pain   Behavior During Therapy [facility] for tasks assessed/performed      Past Medical History:  Diagnosis Date  . Anxiety   . Depression     Past Surgical History:  Procedure Laterality Date  . ABDOMINAL HYSTERECTOMY     still has ovaries    There were no vitals filed for this visit.      Subjective Assessment - 04/11/17 1106    Subjective I got here early and I cant really sleep well. It really help to bend back ( extend) my back.  but I want to try anything that will help.  The exercises help with my pain and reduce the pain into my legs.   Pertinent History Depression, Anxiety. Scoliosis, Low back pain.  bil arm pain as per pt   Limitations Sitting;Lifting;Standing;Walking;House hold activities   Diagnostic tests Minimal degenerative disc disease in the lumbar spine. Slightlateral recess narrowing at L4-5 which could affect the left L5 xray, Mild scoliosis concave left. Diffuse degenerative change. No   Currently in Pain? Yes   Pain Score 8    Pain Location Back   Pain Orientation Mid;Right;Left   Pain Descriptors / Indicators Throbbing;Aching;Spasm;Burning   Multiple Pain Sites Yes   Pain Score 5  when she wakes up, 10/10   Pain Location Hand   Pain Orientation Right;Left   Pain Descriptors / Indicators Aching;Throbbing;Tingling;Numbness   Pain Type Chronic pain    Pain Onset More than a month ago   Pain Frequency Constant   Aggravating Factors  hard to drive.                         [facility] Adult PT Treatment/Exercise - 04/11/17 1116      Bed Mobility   Bed Mobility Supine to Sit;Sit to Supine   Supine to Sit 7: Independent  instruction on proper technique to decrease pain   Sit to Supine 7: Independent     Self-Care   Self-Care Other Self-Care Comments   Other Self-Care Comments  education on TDN aftercare and precautians     Lumbar Exercises: Stretches   Quadruped Mid Back Stretch 30 seconds   Quadruped Mid Back Stretch Limitations forward and left and right x 2 each for 30 seconds     Lumbar Exercises: Standing   Other Standing Lumbar Exercises standing Mckenzie extesnion 5 sec hold x 10   pain decreases with repetitive movement     Lumbar Exercises: Sidelying   Other Sidelying Lumbar Exercises bil Quadratus Lumborum stretch wth handout and during TDN RX      Lumbar Exercises: Prone   Other Prone Lumbar Exercises prone on elbow for 2 minutes   Other Prone Lumbar Exercises Prone press up x 5 5 sec hold     Modalities   Modalities Electrical Stimulation  Acupuncturist Location low back bil   Electrical Stimulation Action IFC   Electrical Stimulation Parameters to pat tolerance 20 ma   Electrical Stimulation Goals Pain  spasm     Manual Therapy   Manual Therapy Joint mobilization;Soft tissue mobilization   Joint Mobilization thoracic and lumbar PA mobs grade 3 and sacrum   Soft tissue mobilization bil quadratus lumborum           Trigger Point Dry Needling - 04/11/17 1117    Consent Given? Yes   Education Handout Provided Yes   Muscles Treated Upper Body Quadratus Lumborum  bil   Muscles Treated Lower Body Gluteus minimus;Gluteus maximus  lumbosacral bil all needling   Gluteus Maximus Response Twitch response elicited;Palpable increased muscle length  iliac crest    Gluteus Minimus Response Twitch response elicited;Palpable increased muscle length              PT Education - 04/11/17 1121    Education provided Yes   Education Details education on traction and TDN  and review of McKenzie exericses   Person(s) Educated Patient   Methods Explanation;Demonstration;Handout;Verbal cues   Comprehension Verbalized understanding;Returned demonstration          PT Short Term Goals - 04/02/17 0959      PT SHORT TERM GOAL #1   Title STG=LTG           PT Long Term Goals - 04/02/17 0959      PT LONG TERM GOAL #1   Title "Demonstrate and verbalize techniques to reduce the risk of re-injury including: lifting, posture, body mechanics especially when bending over for possible temp work   Time 6   Period Weeks   Status New   Target Date 05/14/17     PT LONG TERM GOAL #2   Title "Pt will be independent with advanced HEP   Time 6   Period Weeks   Status New   Target Date 05/14/17     PT LONG TERM GOAL #3   Title "Pt will tolerate sitting 1 hour without increased pain to ride in car without increased pain   Time 6   Period Weeks   Status New   Target Date 05/14/17     PT LONG TERM GOAL #4   Title "Pt will tolerate walking for 1 mile without increased pain in order to return to PLOF and possible work   Time 6   Period Weeks   Status New   Target Date 05/14/17     PT LONG TERM GOAL #5   Title "FOTO will improve from  78% limitation  to  59% limitation   indicating improved functional mobility.    Time 6   Status New   Target Date 05/14/17               Plan - 04/11/17 1237    Clinical Impression Statement Ms. Campus comes into clinic with tears in eyes and complaints of all over body pain including tingling and numbness and pain in bil hands that makes it hard to drive. Pt was ovserved to make transtiong movements from and to mat with imporper body mechanicis Pt also complained about knee and all over body pain.  Pt was  told we could work on her back as per her order and try to reduce pain as best we can.  Pt was instructed to call MD if pain worsened in hands.  Pt begged for any intervention and was  educated on TDN and consented to RX and understood precautionas and after care.  Pt after RX stated she was a 3/10 from an 8/10.  She did say that the McKenzie extension was helpful but pain control did not last.  Will continue to progress exercise as pt is able.    Rehab Potential Good   PT Frequency 2x / week   PT Duration 6 weeks   PT Treatment/Interventions Iontophoresis 4mg /ml Dexamethasone;Electrical Stimulation;Cryotherapy;Moist Heat;Traction;Ultrasound;Functional mobility training;Patient/family education;Neuromuscular re-education;Therapeutic exercise;Therapeutic activities;Manual techniques;Passive range of motion;Taping;Dry needling   PT Next Visit Plan Go over basic posture. body mechanics.  Review initial HEP,  Start core, basic back as able.     PT Home Exercise Plan childs pose, Mckenzie standiing extension,  prone on elbows for 55minutes and prone press ups sidelying Quadratus lumborum stretch   Consulted and Agree with Plan of Care Patient      Patient will benefit from skilled therapeutic intervention in order to improve the following deficits and impairments:  Difficulty walking, Pain, Postural dysfunction, Improper body mechanics, Increased muscle spasms, Increased fascial restricitons, Hypomobility, Decreased strength, Decreased mobility, Decreased range of motion  Visit Diagnosis: Bilateral low back pain with sciatica, sciatica laterality unspecified, unspecified chronicity  Abnormal posture  Difficulty in walking, not elsewhere classified  Muscle weakness (generalized)     Problem List Patient Active Problem List   Diagnosis Date Noted  . Midline low back pain 11/22/2016  . Scoliosis 11/22/2016  . Smoker 09/07/2015  . Essential hypertension 05/25/2015  . Insomnia 05/25/2015  .  Anxiety   . Depression    Voncille Lo, PT Certified Exercise Expert for the Aging Adult  04/11/17 12:54 PM Phone: [phone] Fax: [facility] [address] [place], [place], [address] Phone: [phone]   Fax:  [phone]  Name: CAROLINE LONGIE MRN: [id number] Date of Birth: 02/12/1961

## 2017-04-16 ENCOUNTER — Ambulatory Visit: Payer: BLUE CROSS/BLUE SHIELD | Admitting: Physical Therapy

## 2017-04-16 ENCOUNTER — Encounter: Payer: Self-pay | Admitting: Physical Therapy

## 2017-04-16 DIAGNOSIS — R262 Difficulty in walking, not elsewhere classified: Secondary | ICD-10-CM | POA: Diagnosis not present

## 2017-04-16 DIAGNOSIS — M5440 Lumbago with sciatica, unspecified side: Secondary | ICD-10-CM | POA: Diagnosis not present

## 2017-04-16 DIAGNOSIS — R293 Abnormal posture: Secondary | ICD-10-CM | POA: Diagnosis not present

## 2017-04-16 DIAGNOSIS — M6281 Muscle weakness (generalized): Secondary | ICD-10-CM

## 2017-04-16 NOTE — Therapy (Signed)
[place], [place], [address] Phone: [phone]   Fax:  [phone]  Physical Therapy Treatment/Discharge Note  Patient Details  Name: Regina Richardson MRN: [id number] Date of Birth: March 28, 1961 Referring Provider: Rodell Perna MD  Encounter Date: 04/16/2017      PT End of Session - 04/16/17 0849    Visit Number 3   Number of Visits 12   Date for PT Re-Evaluation 05/14/17   Authorization Type BCBS   PT Start Time 0849   PT Stop Time 0938   PT Time Calculation (min) 49 min   Activity Tolerance Patient tolerated treatment well;Patient limited by pain   Behavior During Therapy [facility] for tasks assessed/performed      Past Medical History:  Diagnosis Date  . Anxiety   . Depression     Past Surgical History:  Procedure Laterality Date  . ABDOMINAL HYSTERECTOMY     still has ovaries    There were no vitals filed for this visit.      Subjective Assessment - 04/16/17 0850    Subjective I got here a little late.  After I left last time, I cried all the way home.  I want to make today my last day. I am not working and I would rather not pay the co pay.  I live in [place] and It is too much for me to drive to here for therapy.   Pertinent History Depression, Anxiety. Scoliosis, Low back pain.  bil arm pain as per pt   Limitations Sitting;Lifting;Standing;Walking;House hold activities   Diagnostic tests Minimal degenerative disc disease in the lumbar spine. Slightlateral recess narrowing at L4-5 which could affect the left L5 xray, Mild scoliosis concave left. Diffuse degenerative change. No            [facility] PT Assessment - 04/16/17 0919      Observation/Other Assessments   Focus on Therapeutic Outcomes (FOTO)  FOTO intake 45% limitation 55%  predicted 59%     AROM   Lumbar Flexion 58  pain coming back to standing after flexion   Lumbar Extension 15  ERP   Lumbar - Right Side Bend 27  ERP    Lumbar - Left Side Bend 23  ERP   Lumbar - Right Rotation 75%   Lumbar - Left Rotation 75%     Strength   Overall Strength Deficits   Overall Strength Comments Pain level 2-3/10   Right Hip Flexion 4/5   Right Hip ABduction 4/5   Left Hip Flexion 4/5   Left Hip ABduction 4/5   Right Knee Flexion 4/5   Right Knee Extension 4/5   Left Knee Flexion 4/5   Left Knee Extension 4/5                     [facility] Adult PT Treatment/Exercise - 04/16/17 0944      Ambulation/Gait   Ambulation Distance (Feet) 150 Feet   Assistive device None   Gait velocity 2.55f/sec    Gait Comments pt with improved gait pattern with increased arm swing and stride length in normal community ambulator     Self-Care   Self-Care Lifting;ADL's;Posture;Other Self-Care Comments   ADL's discussed with handout   Lifting PT demo   Posture dicussed with handout and ergonomic in home   Other Self-Care Comments  need for movement and exercises edcuated on communtiy wellness opportunities in [place] and [place] near her home     Lumbar Exercises:  Stretches   Active Hamstring Stretch 3 reps;30 seconds   Active Hamstring Stretch Limitations bil with sheet   Single Knee to Chest Stretch 5 reps;10 seconds   Single Knee to Chest Stretch Limitations bil   Lower Trunk Rotation 10 seconds;5 reps   Pelvic Tilt 10 seconds   Pelvic Tilt Limitations 10 reps  moderate cuing for correct execution   Quadruped Mid Back Stretch 30 seconds   Quadruped Mid Back Stretch Limitations forward and left and right x 2 each for 30 seconds     Lumbar Exercises: Standing   Other Standing Lumbar Exercises standing gastroc stretch bil 30 sec each x 2 with Vc and TC     Lumbar Exercises: Supine   Bridge 15 reps;3 seconds   Bridge Limitations VC and TC     Lumbar Exercises: Sidelying   Other Sidelying Lumbar Exercises bil Quadratus Lumborum stretch wth handout and during TDN RX                   PT Short  Term Goals - 04/02/17 0959      PT SHORT TERM GOAL #1   Title STG=LTG           PT Long Term Goals - 04/16/17 0912      PT LONG TERM GOAL #1   Title "Demonstrate and verbalize techniques to reduce the risk of re-injury including: lifting, posture, body mechanics especially when bending over for possible temp work   Baseline Partially met, able to verbalize all of it but no demo of lifting   Time 6   Period Weeks   Status Partially Met     PT LONG TERM GOAL #2   Title "Pt will be independent with advanced HEP   Baseline Pt demonstrated all of HEP before leaving this session   Time 6   Period Weeks   Status Partially Met     PT LONG TERM GOAL #3   Title "Pt will tolerate sitting 1 hour without increased pain to ride in car without increased pain   Baseline  can only sit for an hour using a heating pad at home.  Pt can drive 30 minutes from [place]   Time 6   Period Weeks   Status Partially Met     PT LONG TERM GOAL #4   Title "Pt will tolerate walking for 1 mile without increased pain in order to return to PLOF and possible work   Baseline Pt not able to stand for greater than 15 minutes by pt report.  Can walk to the mailbox at home   Status Not Met     PT LONG TERM GOAL #5   Title "FOTO will improve from  78% limitation  to  59% limitation   indicating improved functional mobility.    Baseline limitation 55%   Time 6   Period Weeks   Status Achieved               Plan - 04/16/17 0932    Clinical Impression Statement Regina Richardson partially met goals and wished to discountinue PT due to long drive and anxiety about driving. She was given HEP and ADL/posture/body mechanics information/education in order to perform at home.  Pt states she would like to invest her money in a community wellness in [place] or [place] nearer to her home.  Pt did meet FOTO goal of 55% ( predicted 59% liimtation) even in 3 visit.  Pt improved all goals except for strength  due to  short visits ( only 3) but AROM did improve significatntly . All goals were met or partially met.  Pt will continue with communtiy wellness activities and HEP/ADL's/posture. Pt requested to be discharged due to stress anxiety driving from [facility] and also co pay.  Pt is able to use HEP and decreased pain to 2-3/10 with exercise.   Rehab Potential Good   PT Frequency 2x / week   PT Duration 6 weeks   PT Treatment/Interventions Iontophoresis 22m/ml Dexamethasone;Electrical Stimulation;Cryotherapy;Moist Heat;Traction;Ultrasound;Functional mobility training;Patient/family education;Neuromuscular re-education;Therapeutic exercise;Therapeutic activities;Manual techniques;Passive range of motion;Taping;Dry needling   PT Next Visit Plan DC at pt request   PT Home Exercise Plan childs pose, Mckenzie standiing extension,  prone on elbows for 285mutes and prone press ups sidelying Quadratus lumborum stretch basic back program and ADL/body meDealernd posture   Consulted and Agree with Plan of Care Patient      Patient will benefit from skilled therapeutic intervention in order to improve the following deficits and impairments:  Difficulty walking, Pain, Postural dysfunction, Improper body mechanics, Increased muscle spasms, Increased fascial restricitons, Hypomobility, Decreased strength, Decreased mobility, Decreased range of motion  Visit Diagnosis: Bilateral low back pain with sciatica, sciatica laterality unspecified, unspecified chronicity  Abnormal posture  Difficulty in walking, not elsewhere classified  Muscle weakness (generalized)     Problem List Patient Active Problem List   Diagnosis Date Noted  . Midline low back pain 11/22/2016  . Scoliosis 11/22/2016  . Smoker 09/07/2015  . Essential hypertension 05/25/2015  . Insomnia 05/25/2015  . Anxiety   . Depression     LaVoncille LoPT Certified Exercise Expert for the Aging Adult  04/16/17 9:51 AM Phone:  [phone]: [address]North [place]one: [phone] Fax:  [phone] PHYSICAL THERAPY DISCHARGE SUMMARY  Visits from Start of Care: 3  Current functional level related to goals / functional outcomes: As above   Remaining deficits: As in assessment.  Strength grossly 4/5.  Needs to continue walking and HEP   Education / Equipment: HEP Plan: Patient agrees to discharge.  Patient goals were partially met. Patient is being discharged due to the patient's request.  ?????    and financial reasons.  [facility] [place] [facility] use money for community wellness.  LaVoncille LoPT Certified Exercise Expert for the Aging Adult  04/16/17 9:52 AM Phone: [phone]: [phone]  Name: Regina Richardson: [id number] of Birth: 1103-07-1960

## 2017-04-16 NOTE — Patient Instructions (Addendum)
MOTION IS LOTION FOR YOUR BACK. KEEP MOVING Yilin.   Sleeping on Back  Place pillow under knees. A pillow with cervical support and a roll around waist are also helpful. Copyright  VHI. All rights reserved.  Sleeping on Side Place pillow between knees. Use cervical support under neck and a roll around waist as needed. Copyright  VHI. All rights reserved.   Sleeping on Stomach   If this is the only desirable sleeping position, place pillow under lower legs, and under stomach or chest as needed.  Posture - Sitting   Sit upright, head facing forward. Try using a roll to support lower back. Keep shoulders relaxed, and avoid rounded back. Keep hips level with knees. Avoid crossing legs for long periods. Stand to Sit / Sit to Stand   To sit: Bend knees to lower self onto front edge of chair, then scoot back on seat. To stand: Reverse sequence by placing one foot forward, and scoot to front of seat. Use rocking motion to stand up.   Work Height and Reach  Ideal work height is no more than 2 to 4 inches below elbow level when standing, and at elbow level when sitting. Reaching should be limited to arm's length, with elbows slightly bent.  Bending  Bend at hips and knees, not back. Keep feet shoulder-width apart.    Posture - Standing   Good posture is important. Avoid slouching and forward head thrust. Maintain curve in low back and align ears over shoul- ders, hips over ankles.  Alternating Positions   Alternate tasks and change positions frequently to reduce fatigue and muscle tension. Take rest breaks. Computer Work   Position work to Programmer, multimedia. Use proper work and seat height. Keep shoulders back and down, wrists straight, and elbows at right angles. Use chair that provides full back support. Add footrest and lumbar roll as needed.  Getting Into / Out of Car  Lower self onto seat, scoot back, then bring in one leg at a time. Reverse sequence to get  out.  Dressing  Lie on back to pull socks or slacks over feet, or sit and bend leg while keeping back straight.    Housework - Sink  Place one foot on ledge of cabinet under sink when standing at sink for prolonged periods.   Pushing / Pulling  Pushing is preferable to pulling. Keep back in proper alignment, and use leg muscles to do the work.  Deep Squat   Squat and lift with both arms held against upper trunk. Tighten stomach muscles without holding breath. Use smooth movements to avoid jerking.  Avoid Twisting   Avoid twisting or bending back. Pivot around using foot movements, and bend at knees if needed when reaching for articles.  Carrying Luggage   Distribute weight evenly on both sides. Use a cart whenever possible. Do not twist trunk. Move body as a unit.   Lifting Principles .Maintain proper posture and head alignment. .Slide object as close as possible before lifting. .Move obstacles out of the way. .Test before lifting; ask for help if too heavy. .Tighten stomach muscles without holding breath. .Use smooth movements; do not jerk. .Use legs to do the work, and pivot with feet. .Distribute the work load symmetrically and close to the center of trunk. .Push instead of pull whenever possible.   Ask For Help   Ask for help and delegate to others when possible. Coordinate your movements when lifting together, and maintain the low back curve.  Log Roll  Lying on back, bend left knee and place left arm across chest. Roll all in one movement to the right. Reverse to roll to the left. Always move as one unit. Housework - Sweeping  Use long-handled equipment to avoid stooping.   Housework - Wiping  Position yourself as close as possible to reach work surface. Avoid straining your back.  Laundry - Unloading Wash   To unload small items at bottom of washer, lift leg opposite to arm being used to reach.  [place] close to area to be  raked. Use arm movements to do the work. Keep back straight and avoid twisting.     Cart  When reaching into cart with one arm, lift opposite leg to keep back straight.   Getting Into / Out of Bed  Lower self to lie down on one side by raising legs and lowering head at the same time. Use arms to assist moving without twisting. Bend both knees to roll onto back if desired. To sit up, start from lying on side, and use same move-ments in reverse. Housework - Vacuuming  Hold the vacuum with arm held at side. Step back and forth to move it, keeping head up. Avoid twisting.   Laundry - IT consultant so that bending and twisting can be avoided.   Laundry - Unloading Dryer  Squat down to reach into clothes dryer or use a reacher.  Gardening - Weeding / Probation officer or Kneel. Knee pads may be helpful.                    Bridge   Lie back, legs bent. Inhale, pressing hips up. Keeping ribs in, lengthen lower back. Exhale, rolling down along spine from top. Repeat __15__ times. Do __1-2__ sessions per day.  Copyright  VHI. All rights reserved.   Pelvic Tilt   Flatten back by tightening stomach muscles and buttocks. Repeat __10__ times per set. Do _1___ sets per session. Do __1-2__ sessions per day.  http://orth.exer.us/134   Copyright  VHI. All rights reserved. Knee to Chest (Flexion)   Pull knee toward chest. Feel stretch in lower back or buttock area. Breathing deeply, Hold __5__ seconds. Repeat with other knee. Repeat __5__ times. Do _1-2___ sessions per day.  http://[email or web address].exer.us/225   Copyright  VHI. All rights reserved.   Lower Trunk Rotation Stretch   Keeping back flat and feet together, rotate knees to left side. Hold __10__ seconds. Repeat 10____ times per set. Do ___1_ sets per session. Do ____ sessions per day.  http://orth.exer.us/122   Copyright  VHI. All rights reserved.  Supine: Leg Stretch With Strap  (Basic)   Lie on back with one knee bent, foot flat on floor. Hook strap around other foot. Straighten knee. Keep knee level with other knee. Hold 30___ seconds. Relax leg completely down to floor.  Repeat _2-3__ times per session. Each leg Do 1-2___ sessions per day.  Copyright  VHI. All rights reserved.  Standing Arch (Extension)   Place hands in small of back. Using hands as fulcrum, arch backward. Try to keep knees straight. Great exercise if sitting makes pain worse. Use to break up long periods of sitting. Repeat _5___ times. Hold for  5 to10 sec  Do every 1-2 hours throughout the day.  http://[email or web address].exer.us/247   Copyright  VHI. All rights reserved.  Straight Leg Calf Stretch (Gastroc)   Put palms against wall, one leg forward and bent. With other leg  back straight and heel flat on floor, lean into wall. Hold _30__ seconds. Change legs and repeat. Repeat _2-3___ times. Do _1-2___ sessions per day.  DO NOT over bend your back.  Belly button to spine, Keep spine quiet as you stretch legs.  [email or web address], PT Certified Exercise Expert for the Aging Adult  04/16/17 9:00 AM Phone: [phone] Fax: [phone]

## 2017-04-18 ENCOUNTER — Ambulatory Visit: Payer: BLUE CROSS/BLUE SHIELD | Admitting: Physical Therapy

## 2017-04-19 ENCOUNTER — Other Ambulatory Visit: Payer: Self-pay

## 2017-04-19 DIAGNOSIS — M4186 Other forms of scoliosis, lumbar region: Secondary | ICD-10-CM

## 2017-04-19 DIAGNOSIS — M545 Low back pain, unspecified: Secondary | ICD-10-CM

## 2017-04-23 ENCOUNTER — Other Ambulatory Visit: Payer: Self-pay | Admitting: Physician Assistant

## 2017-04-23 ENCOUNTER — Encounter: Payer: BLUE CROSS/BLUE SHIELD | Admitting: Physical Therapy

## 2017-04-23 NOTE — Telephone Encounter (Signed)
Ok to refill 

## 2017-04-24 NOTE — Telephone Encounter (Signed)
Prescription sent to pharmacy.

## 2017-04-24 NOTE — Telephone Encounter (Signed)
Approved.  #[id number]. 

## 2017-04-25 ENCOUNTER — Encounter: Payer: BLUE CROSS/BLUE SHIELD | Admitting: Physical Therapy

## 2017-04-30 ENCOUNTER — Encounter: Payer: BLUE CROSS/BLUE SHIELD | Admitting: Physical Therapy

## 2017-05-02 ENCOUNTER — Encounter: Payer: BLUE CROSS/BLUE SHIELD | Admitting: Physical Therapy

## 2017-05-07 ENCOUNTER — Other Ambulatory Visit: Payer: Self-pay | Admitting: Physician Assistant

## 2017-05-07 ENCOUNTER — Ambulatory Visit (INDEPENDENT_AMBULATORY_CARE_PROVIDER_SITE_OTHER): Payer: BLUE CROSS/BLUE SHIELD | Admitting: Orthopaedic Surgery

## 2017-05-07 ENCOUNTER — Encounter (INDEPENDENT_AMBULATORY_CARE_PROVIDER_SITE_OTHER): Payer: Self-pay | Admitting: Orthopaedic Surgery

## 2017-05-07 VITALS — BP 118/84 | HR 75 | Ht 60.0 in | Wt 150.0 lb

## 2017-05-07 DIAGNOSIS — M5440 Lumbago with sciatica, unspecified side: Secondary | ICD-10-CM | POA: Diagnosis not present

## 2017-05-07 DIAGNOSIS — M5136 Other intervertebral disc degeneration, lumbar region: Secondary | ICD-10-CM

## 2017-05-07 DIAGNOSIS — F419 Anxiety disorder, unspecified: Secondary | ICD-10-CM

## 2017-05-07 DIAGNOSIS — F17200 Nicotine dependence, unspecified, uncomplicated: Secondary | ICD-10-CM
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
## 2017-05-07 NOTE — Telephone Encounter (Signed)
Medication refilled per protocol. 

## 2017-05-08 DIAGNOSIS — M5136 Other intervertebral disc degeneration, lumbar region: Secondary | ICD-10-CM | POA: Insufficient documentation

## 2017-05-21 ENCOUNTER — Other Ambulatory Visit: Payer: Self-pay | Admitting: Physician Assistant

## 2017-05-21 NOTE — Telephone Encounter (Signed)
Ok to refill 

## 2017-05-22 NOTE — Telephone Encounter (Signed)
Medication called to pharmacy. 

## 2017-05-22 NOTE — Telephone Encounter (Signed)
Approved # [id number] 

## 2017-05-24 ENCOUNTER — Other Ambulatory Visit: Payer: Self-pay | Admitting: Physician Assistant

## 2017-06-07 ENCOUNTER — Encounter (INDEPENDENT_AMBULATORY_CARE_PROVIDER_SITE_OTHER): Payer: Self-pay

## 2017-06-24 ENCOUNTER — Other Ambulatory Visit: Payer: Self-pay | Admitting: Physician Assistant

## 2017-06-24 DIAGNOSIS — M545 Low back pain, unspecified: Secondary | ICD-10-CM

## 2017-06-24 DIAGNOSIS — M4186 Other forms of scoliosis, lumbar region: Secondary | ICD-10-CM

## 2017-06-24 NOTE — Telephone Encounter (Signed)
Due for OV. Sched OV. Can refill each med for one month supply to hold her over until OV.

## 2017-06-24 NOTE — Telephone Encounter (Signed)
Last OV 6/28 Last refill Tramadol 03/26/2017 mobic 6/15 Is patient suppose to be taking vanlafaxine?  Ok to refill?

## 2017-06-25 NOTE — Telephone Encounter (Signed)
Tried calling patient line busy will try again

## 2017-06-26 ENCOUNTER — Ambulatory Visit (INDEPENDENT_AMBULATORY_CARE_PROVIDER_SITE_OTHER): Payer: BLUE CROSS/BLUE SHIELD

## 2017-06-26 ENCOUNTER — Ambulatory Visit (INDEPENDENT_AMBULATORY_CARE_PROVIDER_SITE_OTHER): Payer: BLUE CROSS/BLUE SHIELD | Admitting: Physical Medicine and Rehabilitation

## 2017-06-26 VITALS — BP 141/75 | HR 70 | Temp 97.9°F

## 2017-06-26 DIAGNOSIS — M5416 Radiculopathy, lumbar region: Secondary | ICD-10-CM

## 2017-06-26 DIAGNOSIS — M545 Low back pain, unspecified: Secondary | ICD-10-CM

## 2017-06-26 DIAGNOSIS — M4126 Other idiopathic scoliosis, lumbar region: Secondary | ICD-10-CM

## 2017-06-26 MED ORDER — LIDOCAINE HCL (PF) 1 % IJ SOLN
2.0000 mL | Freq: Once | INTRAMUSCULAR | Status: AC
  Administered 2017-06-26: 2 mL

## 2017-06-26 MED ORDER — METHYLPREDNISOLONE ACETATE 80 MG/ML IJ SUSP: 80.0000 mg | Freq: Once | INTRAMUSCULAR | Status: DC

## 2017-06-26 NOTE — Progress Notes (Deleted)
Patient here today with low back pain, more on the left than right. She has bilateral radiculopathy in her legs. She's had this for years. She isn't allergic to anything, and she isn't taking any blood thinners.

## 2017-06-26 NOTE — Patient Instructions (Signed)

## 2017-06-26 NOTE — Procedures (Signed)
Regina Richardson is a 56 year old female followed by Dr. Lorin Mercy and who is here today at his request for epidural injection at L4-5.  She is essentially having pain all over from her neck and shoulders and arms and hands with numbness and tingling as well as low back pain left more than right with pain down both legs.  She reports pain for several years.  She reports having a difficult time getting up in the morning and will sometimes take several hours before she can really get up and do anything.  MRI of the lumbar spine has been performed and is reviewed again below.  We are going to complete a left sided interlaminar epidural steroid injection.  Other thoughts if not already worked up would be rheumatologic workup for rheumatologic disease or fibromyalgia.  Lumbar Epidural Steroid Injection - Interlaminar Approach with Fluoroscopic Guidance  Patient: Regina Richardson      Date of Birth: 1960-12-23 MRN: [id number] PCP: Orlena Sheldon, PA-C      Visit Date: 06/26/2017   Universal Protocol:     Consent Given By: the patient  Position: PRONE  Additional Comments: Vital signs were monitored before and after the procedure. Patient was prepped and draped in the usual sterile fashion. The correct patient, procedure, and site was verified.   Injection Procedure Details:  Procedure Site One Meds Administered:  Meds ordered this encounter  Medications  . lidocaine (PF) (XYLOCAINE) 1 % injection 2 mL  . methylPREDNISolone acetate (DEPO-MEDROL) injection 80 mg     Laterality: Left  Location/Site:  L4-L5  Needle size: 20 G  Needle type: Tuohy  Needle Placement: Paramedian epidural  Findings:   -Comments: Excellent flow of contrast into the epidural space.  Procedure Details: Using a paramedian approach from the side mentioned above, the region overlying the inferior lamina was localized under fluoroscopic visualization and the soft tissues overlying this structure were infiltrated with 4 ml.  of 1% Lidocaine without Epinephrine. The Tuohy needle was inserted into the epidural space using a paramedian approach.   The epidural space was localized using loss of resistance along with lateral and bi-planar fluoroscopic views.  After negative aspirate for air, blood, and CSF, a 2 ml. volume of Isovue-250 was injected into the epidural space and the flow of contrast was observed. Radiographs were obtained for documentation purposes.    The injectate was administered into the level noted above.   Additional Comments:  The patient tolerated the procedure well No complications occurred Dressing: Band-Aid    Post-procedure details: Patient was observed during the procedure. Post-procedure instructions were reviewed.  Patient left the clinic in stable condition.  Pertinent Imaging: MRI LUMBAR SPINE WITHOUT CONTRAST  TECHNIQUE: Multiplanar, multisequence MR imaging of the lumbar spine was performed. No intravenous contrast was administered.  COMPARISON:  11/15/2016  FINDINGS: Segmentation:  Standard.  Alignment:  Physiologic.  Vertebrae:  No fracture, evidence of discitis, or bone lesion.  Conus medullaris: Extends to the T12-L1 level and appears normal.  Paraspinal and other soft tissues: 2.1 cm nodule in the left adrenal gland, most likely a benign adenoma. Otherwise negative.  Disc levels:  L1-2: Minimal disc bulge to the right of midline with no neural impingement.  L2-3: Minimal disc bulge to the right of midline with no neural impingement.  L3-4:  Small broad-based disc bulge without neural impingement.  L4-5: Small broad-based disc bulge slightly asymmetric to the left with slight narrowing of the left lateral recess which might affect the left L5  nerve. Slight thickening of the ligamentum flavum.  L5-S1:  Tiny central disc bulge with no neural impingement.  IMPRESSION: Minimal degenerative disc disease in the lumbar spine. Slight lateral  recess narrowing at L4-5 which could affect the left L5 nerve.   Electronically Signed   By: Lorriane Shire M.D.   On: 02/08/2017 12:16

## 2017-06-26 NOTE — Telephone Encounter (Signed)
lvmtrc with husband

## 2017-06-27 NOTE — Telephone Encounter (Signed)
Patient is schedule for OV 12/13 @1015 

## 2017-06-28 NOTE — Telephone Encounter (Signed)
rx filled for 1 month tramadol called in to pharmacy

## 2017-07-04 ENCOUNTER — Encounter: Payer: Self-pay | Admitting: Physician Assistant

## 2017-07-04 ENCOUNTER — Other Ambulatory Visit: Payer: Self-pay

## 2017-07-04 ENCOUNTER — Ambulatory Visit: Payer: BLUE CROSS/BLUE SHIELD | Admitting: Physician Assistant

## 2017-07-04 VITALS — BP 152/102 | Temp 98.4°F | Resp 18 | Wt 163.2 lb

## 2017-07-04 DIAGNOSIS — I10 Essential (primary) hypertension: Secondary | ICD-10-CM

## 2017-07-04 DIAGNOSIS — F329 Major depressive disorder, single episode, unspecified: Secondary | ICD-10-CM

## 2017-07-04 DIAGNOSIS — F419 Anxiety disorder, unspecified: Secondary | ICD-10-CM | POA: Diagnosis not present

## 2017-07-04 DIAGNOSIS — M51369 Other intervertebral disc degeneration, lumbar region without mention of lumbar back pain or lower extremity pain: Secondary | ICD-10-CM

## 2017-07-04 DIAGNOSIS — M5136 Other intervertebral disc degeneration, lumbar region: Secondary | ICD-10-CM

## 2017-07-04 DIAGNOSIS — F32A Depression, unspecified: Secondary | ICD-10-CM
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
## 2017-07-25 ENCOUNTER — Other Ambulatory Visit: Payer: Self-pay | Admitting: Physician Assistant

## 2017-07-25 DIAGNOSIS — F17200 Nicotine dependence, unspecified, uncomplicated: Secondary | ICD-10-CM

## 2017-07-25 DIAGNOSIS — F419 Anxiety disorder, unspecified: Secondary | ICD-10-CM

## 2017-07-25 NOTE — Telephone Encounter (Signed)
Refill appropriate 

## 2017-07-31 ENCOUNTER — Ambulatory Visit (HOSPITAL_COMMUNITY)
Admission: RE | Admit: 2017-07-31 | Discharge: 2017-07-31 | Disposition: A | Discharge status: Home / Self Care | Payer: BLUE CROSS/BLUE SHIELD | Source: Ambulatory Visit | Attending: Physician Assistant | Admitting: Physician Assistant

## 2017-07-31 ENCOUNTER — Encounter: Payer: Self-pay | Admitting: Physician Assistant

## 2017-07-31 ENCOUNTER — Ambulatory Visit: Payer: BLUE CROSS/BLUE SHIELD | Admitting: Physician Assistant

## 2017-07-31 ENCOUNTER — Other Ambulatory Visit: Payer: Self-pay

## 2017-07-31 VITALS — BP 130/82 | HR 90 | Temp 98.0°F | Resp 16 | Wt 162.6 lb

## 2017-07-31 DIAGNOSIS — E279 Disorder of adrenal gland, unspecified: Secondary | ICD-10-CM | POA: Insufficient documentation

## 2017-07-31 DIAGNOSIS — R933 Abnormal findings on diagnostic imaging of other parts of digestive tract: Secondary | ICD-10-CM | POA: Insufficient documentation

## 2017-07-31 DIAGNOSIS — R1084 Generalized abdominal pain: Secondary | ICD-10-CM | POA: Insufficient documentation

## 2017-07-31 DIAGNOSIS — K921 Melena: Secondary | ICD-10-CM | POA: Diagnosis not present

## 2017-07-31 DIAGNOSIS — R1013 Epigastric pain: Secondary | ICD-10-CM | POA: Diagnosis not present

## 2017-07-31 DIAGNOSIS — R1012 Left upper quadrant pain: Secondary | ICD-10-CM | POA: Diagnosis not present

## 2017-07-31 DIAGNOSIS — R109 Unspecified abdominal pain: Secondary | ICD-10-CM | POA: Diagnosis not present

## 2017-07-31 LAB — CBC
HCT: 34.1 % — ABNORMAL LOW (ref 35.0–45.0)
Hemoglobin: 11.6 g/dL — ABNORMAL LOW (ref 11.7–15.5)
MCH: 32.9 pg (ref 27.0–33.0)
MCHC: 34 g/dL (ref 32.0–36.0)
MCV: 96.6 fL (ref 80.0–100.0)
Platelets: 409 10*3/uL — ABNORMAL HIGH (ref 140–400)
RBC: 3.53 10*6/uL — ABNORMAL LOW (ref 3.80–5.10)
RDW: 12.7 % (ref 11.0–15.0)
WBC: 18.2 10*3/uL — ABNORMAL HIGH (ref 3.8–10.8)

## 2017-07-31 MED ORDER — IOPAMIDOL (ISOVUE-300) INJECTION 61%
100.0000 mL | Freq: Once | INTRAVENOUS | Status: AC | PRN
  Administered 2017-07-31: 100 mL via INTRAVENOUS

## 2017-07-31 NOTE — Progress Notes (Signed)
Patient ID: LINDELL TUSSEY MRN: [id number], DOB: 02-27-1961, 57 y.o. Date of Encounter: @DATE @  Chief Complaint:  Chief Complaint  Patient presents with  . Chest Pain  . Back Pain  . Foot Pain    HPI: 57 y.o. year old female  presents with above.   She uses her finger and points to her epigastric region then traces her finger down the left abdomen.   Reports that she has been having pain in that area for about 1 week.  She reports that about 1-2 days prior to the pain, she had melena. States that when she went to the restroom "it squirted out ".  "Then I looked at it and it was black ". Reports that she "had several episodes like that within about 2-3 days."  Then she went 2-3 days with having no stool. She took a laxative but still had no stool. Then used a suppository--- after that, when she went to the bathroom, all that came out was gas-- says that "all that produced was gas and that all that came out was air." Then she started drinking prune juice and today finally had a stool but again it was black.  States that she is having pain and pointing to her left upper quadrant as area of pain.   Past Medical History:  Diagnosis Date  . Anxiety   . Depression      Home Meds: Outpatient Medications Prior to Visit  Medication Sig Dispense Refill  . buPROPion (WELLBUTRIN SR) 150 MG 12 hr tablet TAKE 1 TABLET BY MOUTH TWICE A DAY 60 tablet 1  . clonazePAM (KLONOPIN) 0.5 MG tablet TAKE 1 TABLET BY MOUTH TWICE A DAY AS NEEDED 60 tablet 2  . cyclobenzaprine (FLEXERIL) 10 MG tablet TAKE 1TAB 3 TIMES A DAY AS NEEDED FOR MUSCLE SPASMS. IF CAUSING DROWSINESS TAKE 1TAB AT BEDTIME ONLY 30 tablet 5  . ibuprofen (ADVIL,MOTRIN) 800 MG tablet Take 800 mg by mouth 3 (three) times daily as needed.  0  . meloxicam (MOBIC) 7.5 MG tablet TAKE 1 TABLET BY MOUTH EVERY DAY WITH BREAKFAST 60 tablet 0  . metoprolol succinate (TOPROL-XL) 50 MG 24 hr tablet TAKE 1 TABLET BY MOUTH EVERY DAY WITH OR  IMMEDIATELY FOLLOWING A MEAL 90 tablet 3  . traMADol (ULTRAM) 50 MG tablet TAKE 1 OR 2 TABLETS BY MOUTH AT BEDTIME AS NEEDED FOR PAIN 60 tablet 0  . venlafaxine XR (EFFEXOR-XR) 150 MG 24 hr capsule TAKE ONE CAPSULE BY MOUTH IN THE MORNING W/BREAKFAST 30 capsule 0  . buPROPion (WELLBUTRIN SR) 150 MG 12 hr tablet TAKE 1 TABLET BY MOUTH TWICE A DAY 60 tablet 3   Facility-Administered Medications Prior to Visit  Medication Dose Route Frequency Provider Last Rate Last Dose  . methylPREDNISolone acetate (DEPO-MEDROL) injection 80 mg  80 mg Other Once Magnus Sinning, MD        Allergies: No Known Allergies  Social History   Socioeconomic History  . Marital status: Single    Spouse name: Not on file  . Number of children: Not on file  . Years of education: Not on file  . Highest education level: Not on file  Social Needs  . Financial resource strain: Not on file  . Food insecurity - worry: Not on file  . Food insecurity - inability: Not on file  . Transportation needs - medical: Not on file  . Transportation needs - non-medical: Not on file  Occupational History  . Not on  file  Tobacco Use  . Smoking status: Current Every Day Smoker    Packs/day: 0.50    Types: Cigarettes  . Smokeless tobacco: Never Used  Substance and Sexual Activity  . Alcohol use: Yes    Alcohol/week: 3.6 oz    Types: 6 Cans of beer per week  . Drug use: No  . Sexual activity: Not Currently  Other Topics Concern  . Not on file  Social History Narrative  . Not on file    Family History  Problem Relation Age of Onset  . Breast cancer Neg Hx      Review of Systems:  See HPI for pertinent ROS. All other ROS negative.    Physical Exam: Blood pressure 130/82, pulse 90, temperature 98 F (36.7 C), temperature source Oral, resp. rate 16, weight 73.8 kg (162 lb 9.6 oz), SpO2 98 %., Body mass index is 31.76 kg/m. General: WF. Appears in mild distress. Neck: Supple. No thyromegaly. No  lymphadenopathy. Lungs: Clear bilaterally to auscultation without wheezes, rales, or rhonchi. Breathing is unlabored. Heart: RRR with S1 S2. No murmurs, rubs, or gallops. Abdomen: She has protrusion as area of xyphoid process. This is severely tender with palpation. She also has significant tenderness with palpation just above this--at level of lower sternum.  There is mild tenderness with palpation of the left upper quadrant of abdomen. Musculoskeletal:  Strength and tone normal for age. Extremities/Skin: Warm and dry. No clubbing or cyanosis. No edema. No rashes or suspicious lesions. Neuro: Alert and oriented X 3. Moves all extremities spontaneously. Gait is normal. CNII-XII grossly in tact. Psych:  Responds to questions appropriately with a normal affect.     ASSESSMENT AND PLAN:  57 y.o. year old female with  1. Melena I ran CBC here and obtained that result here during OV.  I was anticipating that she may have profound anemia, but she actually has very mild anemia. However white count is elevated at 18.2. I discussed having her go directly to emergency room and having complete evaluation there.  She refuses. I discussed having her go directly for CT.  She is agreeable with this approach. Will send her directly for CT now.  I will follow-up with those results this evening.  I will follow-up with her this evening once CT result available. - CBC - COMPLETE METABOLIC PANEL WITH GFR - Amylase - Lipase - H. pylori breath test - CT ABDOMEN PELVIS W CONTRAST; Future  2. Abdominal pain, epigastric I ran CBC here and obtained that result here during OV.  I was anticipating that she may have profound anemia, but she actually has very mild anemia. However white count is elevated at 18.2. I discussed having her go directly to emergency room and having complete evaluation there.  She refuses. I discussed having her go directly for CT.  She is agreeable with this approach. Will send her  directly for CT now.  I will follow-up with those results this evening.  I will follow-up with her this evening once CT result available. - CBC - COMPLETE METABOLIC PANEL WITH GFR - Amylase - Lipase - H. pylori breath test - CT ABDOMEN PELVIS W CONTRAST; Future  3. Abdominal pain, left upper quadrant I ran CBC here and obtained that result here during OV.  I was anticipating that she may have profound anemia, but she actually has very mild anemia. However white count is elevated at 18.2. I discussed having her go directly to emergency room and having complete evaluation  there.  She refuses. I discussed having her go directly for CT.  She is agreeable with this approach. Will send her directly for CT now.  I will follow-up with those results this evening.  I will follow-up with her this evening once CT result available. - CBC - COMPLETE METABOLIC PANEL WITH GFR - Amylase - Lipase - H. pylori breath test - CT ABDOMEN PELVIS W CONTRAST; Future    Signed, Olean Ree [place], [place], [facility] 07/31/2017 4:00 PM

## 2017-08-01 ENCOUNTER — Telehealth: Payer: Self-pay | Admitting: Internal Medicine

## 2017-08-01 ENCOUNTER — Other Ambulatory Visit: Payer: Self-pay

## 2017-08-01 DIAGNOSIS — K921 Melena: Secondary | ICD-10-CM

## 2017-08-01 LAB — COMPLETE METABOLIC PANEL WITH GFR
AG Ratio: 1.5 (calc) (ref 1.0–2.5)
ALT: 45 U/L — ABNORMAL HIGH (ref 6–29)
AST: 41 U/L — ABNORMAL HIGH (ref 10–35)
Albumin: 4 g/dL (ref 3.6–5.1)
Alkaline phosphatase (APISO): 100 U/L (ref 33–130)
BUN/Creatinine Ratio: 8 (calc) (ref 6–22)
BUN: 4 mg/dL — ABNORMAL LOW (ref 7–25)
CO2: 25 mmol/L (ref 20–32)
Calcium: 9.4 mg/dL (ref 8.6–10.4)
Chloride: 96 mmol/L — ABNORMAL LOW (ref 98–110)
Creat: 0.5 mg/dL (ref 0.50–1.05)
GFR, Est African American: 125 mL/min/{1.73_m2} (ref >=60)
GFR, Est Non African American: 107 mL/min/{1.73_m2} (ref >=60)
Globulin: 2.7 g/dL (calc) (ref 1.9–3.7)
Glucose, Bld: 86 mg/dL (ref 65–99)
Potassium: 4.2 mmol/L (ref 3.5–5.3)
Sodium: 131 mmol/L — ABNORMAL LOW (ref 135–146)
Total Bilirubin: 0.2 mg/dL (ref 0.2–1.2)
Total Protein: 6.7 g/dL (ref 6.1–8.1)

## 2017-08-01 LAB — H. PYLORI BREATH TEST: H. pylori Breath Test: DETECTED — AB

## 2017-08-01 LAB — EXTRA LAV TOP TUBE

## 2017-08-01 LAB — AMYLASE: Amylase: 21 U/L (ref 21–101)

## 2017-08-01 LAB — LIPASE: Lipase: 10 U/L (ref 7–60)

## 2017-08-01 MED ORDER — METRONIDAZOLE 500 MG PO TABS: 500.0000 mg | ORAL_TABLET | Freq: Three times a day (TID) | ORAL | 0 refills | Status: DC

## 2017-08-01 MED ORDER — OMEPRAZOLE 20 MG PO CPDR: 20.0000 mg | DELAYED_RELEASE_CAPSULE | Freq: Every day | ORAL | 5 refills | Status: DC

## 2017-08-01 MED ORDER — CIPROFLOXACIN HCL 500 MG PO TABS: 500.0000 mg | ORAL_TABLET | Freq: Two times a day (BID) | ORAL | 0 refills | Status: DC

## 2017-08-01 NOTE — Telephone Encounter (Signed)
Larene Beach notified that patient is scheduled for tomorrow with Nicoletta Ba PA at 2:00.  She will notify the patient

## 2017-08-02 ENCOUNTER — Other Ambulatory Visit (INDEPENDENT_AMBULATORY_CARE_PROVIDER_SITE_OTHER): Payer: BLUE CROSS/BLUE SHIELD

## 2017-08-02 ENCOUNTER — Other Ambulatory Visit: Payer: Self-pay | Admitting: *Deleted

## 2017-08-02 ENCOUNTER — Ambulatory Visit: Payer: BLUE CROSS/BLUE SHIELD | Admitting: Physician Assistant

## 2017-08-02 ENCOUNTER — Encounter: Payer: Self-pay | Admitting: Physician Assistant

## 2017-08-02 VITALS — BP 110/60 | HR 78 | Ht 60.0 in | Wt 164.0 lb

## 2017-08-02 DIAGNOSIS — R1010 Upper abdominal pain, unspecified: Secondary | ICD-10-CM | POA: Diagnosis not present

## 2017-08-02 DIAGNOSIS — R9389 Abnormal findings on diagnostic imaging of other specified body structures: Secondary | ICD-10-CM

## 2017-08-02 DIAGNOSIS — D72829 Elevated white blood cell count, unspecified: Secondary | ICD-10-CM | POA: Diagnosis not present

## 2017-08-02 DIAGNOSIS — R1013 Epigastric pain: Secondary | ICD-10-CM | POA: Diagnosis not present

## 2017-08-02 DIAGNOSIS — R0789 Other chest pain: Secondary | ICD-10-CM | POA: Diagnosis not present

## 2017-08-02 DIAGNOSIS — D649 Anemia, unspecified: Secondary | ICD-10-CM

## 2017-08-02 DIAGNOSIS — K219 Gastro-esophageal reflux disease without esophagitis: Secondary | ICD-10-CM

## 2017-08-02 LAB — IBC PANEL
Iron: 52 ug/dL (ref 42–145)
Saturation Ratios: 17 % — ABNORMAL LOW (ref 20.0–50.0)
Transferrin: 218 mg/dL (ref 212.0–360.0)

## 2017-08-02 LAB — CBC WITH DIFFERENTIAL/PLATELET
Basophils Absolute: 0.2 10*3/uL — ABNORMAL HIGH (ref 0.0–0.1)
Basophils Relative: 1.4 % (ref 0.0–3.0)
Eosinophils Absolute: 0.1 10*3/uL (ref 0.0–0.7)
Eosinophils Relative: 0.9 % (ref 0.0–5.0)
HCT: 32.5 % — ABNORMAL LOW (ref 36.0–46.0)
Hemoglobin: 10.4 g/dL — ABNORMAL LOW (ref 12.0–15.0)
Lymphocytes Relative: 17 % (ref 12.0–46.0)
Lymphs Abs: 2.6 10*3/uL (ref 0.7–4.0)
MCHC: 32.2 g/dL (ref 30.0–36.0)
MCV: 97.1 fl (ref 78.0–100.0)
Monocytes Absolute: 1.2 10*3/uL — ABNORMAL HIGH (ref 0.1–1.0)
Monocytes Relative: 8 % (ref 3.0–12.0)
Neutro Abs: 11.3 10*3/uL — ABNORMAL HIGH (ref 1.4–7.7)
Neutrophils Relative %: 72.7 % (ref 43.0–77.0)
Platelets: 439 10*3/uL — ABNORMAL HIGH (ref 150.0–400.0)
RBC: 3.34 Mil/uL — ABNORMAL LOW (ref 3.87–5.11)
RDW: 12.6 % (ref 11.5–15.5)
WBC: 15.5 10*3/uL — ABNORMAL HIGH (ref 4.0–10.5)

## 2017-08-02 LAB — SEDIMENTATION RATE: Sed Rate: 26 mm/hr (ref 0–30)

## 2017-08-02 LAB — FERRITIN: Ferritin: 220.2 ng/mL (ref 10.0–291.0)

## 2017-08-02 MED ORDER — TRAMADOL HCL 50 MG PO TABS: ORAL_TABLET | ORAL | 0 refills | Status: DC

## 2017-08-02 MED ORDER — PEG 3350-KCL-NA BICARB-NACL 420 G PO SOLR: 4000.0000 mL | Freq: Once | ORAL | 0 refills | Status: AC

## 2017-08-02 NOTE — Progress Notes (Signed)
amb  

## 2017-08-02 NOTE — Progress Notes (Signed)
Patient ID: JOLAINE FRYBERGER, female   DOB: 1960-10-21, 57 y.o.   MRN: [id number]    Subjective:    Patient ID: LURA FALOR, female    DOB: 1960/08/11, 58 y.o.   MRN: [id number]  HPI Manuelita is a pleasant 57 year old white female, new to GI today referred by Dena Billet PA-C for evaluation of abdominal pain and abnormal CT scan. Patient has history of hypertension, anxiety and depression, she status post hysterectomy and has prior history of EtOH abuse. She relates chronic back pain from arthritis. She believes she had a screening colonoscopy about 10 years ago done in an [place] which was negative with the exception of a hemorrhoid. Her current symptoms started about a week ago and she started having epigastric pain radiating to the left upper quadrant. She says this became progressive and fairly constant. In the background of this she's been having a lot of problems with gas and reflux over the past months. She has not had any nausea or vomiting but says her appetite has been decreased since her abdominal pain started last week. No fever or chills. Bowel movements for the most part have been constipated. She tried several over-the-counter preparations including fleets Phospho-Soda etc. with some relief. She says her stools been very dark appearing this week that she's not noticed any bright red blood. She had been on meloxicam regularly which was stopped a few days ago. She is on omeprazole 20 mg by mouth daily regularly. Labs done on 07/31/2016 showed WBC of 18.2, hemoglobin 11.6 hematocrit of 34.1 platelets 409 MCV of 96 H. pylori breath testing was done which was positive and LFTs were within normal limits.  CT of the abdomen and pelvis on 07/31/2016 showed a left adrenal adenoma measuring 2.3 x 2.1 cm, there was cecal soft tissue attenuation measuring 3.7 x 4.3 x 4.2 cm question mass versus stool also had a fatty appearing ileocecal valve, remainder of: Unremarkable. She had empirically been started  on Cipro and Flagyl which she is continuing to take.  Review of Systems Pertinent positive and negative review of systems were noted in the above HPI section.  All other review of systems was otherwise negative.  Outpatient Encounter Medications as of 08/02/2017  Medication Sig  . buPROPion (WELLBUTRIN SR) 150 MG 12 hr tablet TAKE 1 TABLET BY MOUTH TWICE A DAY  . ciprofloxacin (CIPRO) 500 MG tablet Take 1 tablet (500 mg total) by mouth 2 (two) times daily.  . clonazePAM (KLONOPIN) 0.5 MG tablet TAKE 1 TABLET BY MOUTH TWICE A DAY AS NEEDED  . cyclobenzaprine (FLEXERIL) 10 MG tablet TAKE 1TAB 3 TIMES A DAY AS NEEDED FOR MUSCLE SPASMS. IF CAUSING DROWSINESS TAKE 1TAB AT BEDTIME ONLY  . meloxicam (MOBIC) 7.5 MG tablet TAKE 1 TABLET BY MOUTH EVERY DAY WITH BREAKFAST  . metoprolol succinate (TOPROL-XL) 50 MG 24 hr tablet TAKE 1 TABLET BY MOUTH EVERY DAY WITH OR IMMEDIATELY FOLLOWING A MEAL  . metroNIDAZOLE (FLAGYL) 500 MG tablet Take 1 tablet (500 mg total) by mouth 3 (three) times daily.  Marland Kitchen omeprazole (PRILOSEC) 20 MG capsule Take 1 capsule (20 mg total) by mouth daily.  . traMADol (ULTRAM) 50 MG tablet TAKE 1 OR 2 TABLETS BY MOUTH AT BEDTIME AS NEEDED FOR PAIN  . venlafaxine XR (EFFEXOR-XR) 150 MG 24 hr capsule TAKE ONE CAPSULE BY MOUTH IN THE MORNING W/BREAKFAST  . traMADol (ULTRAM) 50 MG tablet Take 1 tab every 6-8 hours as needed for pain.  . [DISCONTINUED] ibuprofen (ADVIL,MOTRIN)  800 MG tablet Take 800 mg by mouth 3 (three) times daily as needed.   Facility-Administered Encounter Medications as of 08/02/2017  Medication  . methylPREDNISolone acetate (DEPO-MEDROL) injection 80 mg   No Known Allergies Patient Active Problem List   Diagnosis Date Noted  . Other intervertebral disc degeneration, lumbar region 05/08/2017  . Midline low back pain 11/22/2016  . Scoliosis 11/22/2016  . Smoker 09/07/2015  . Essential hypertension 05/25/2015  . Insomnia 05/25/2015  . Anxiety and depression   .  Depression    Social History   Socioeconomic History  . Marital status: Married    Spouse name: Not on file  . Number of children: 1  . Years of education: Not on file  . Highest education level: Not on file  Social Needs  . Financial resource strain: Not on file  . Food insecurity - worry: Not on file  . Food insecurity - inability: Not on file  . Transportation needs - medical: Not on file  . Transportation needs - non-medical: Not on file  Occupational History  . Occupation: retired  Tobacco Use  . Smoking status: Current Every Day Smoker    Packs/day: 0.50    Years: 40.00    Pack years: 20.00    Types: Cigarettes  . Smokeless tobacco: Never Used  Substance and Sexual Activity  . Alcohol use: Yes    Alcohol/week: 3.6 oz    Types: 6 Cans of beer per week  . Drug use: No  . Sexual activity: Not Currently  Other Topics Concern  . Not on file  Social History Narrative  . Not on file    Ms. Hubbard's family history includes Alcohol abuse in her father and mother; Diabetes in her cousin, maternal aunt, and sister; Heart disease in her father; Kidney disease in her maternal aunt; Liver disease in her mother and sister.      Objective:    Vitals:   08/02/17 1106  BP: 110/60  Pulse: 78    Physical Exam  ; well-developed white female in no acute distress, blood pressure 110/60 pulse 78, height 5 foot, weight 164, BMI 32. HEENT;; nontraumatic normocephalic EOMI PERRLA sclera anicteric, Cardiovascular; regular rate and rhythm with S1-S2 no murmur or gallop, Pulmonary; clear bilaterally, Abdomen;, Patient is tender over the xiphoid process and also quite tender along the costal margins bilaterally soft, nondistended bowel sounds are active she has some tenderness in the epigastrium, and left upper quadrant, there is also some tenderness bilaterally in the lower quadrants there is no palpable mass or hepatosplenomegaly, Rectal ;exam stool brown and heme negative, Extremities; no  clubbing cyanosis or edema skin warm and dry, Neuropsych; mood and affect appropriate       Assessment & Plan:   #7  57 year old white female with 1 week history of upper abdominal pain, abnormal CT scan raising question of cecal mass. She also has a mild anemia and leukocytosis. By exam her epigastric pain appears to be musculoskeletal with exquisite tenderness over the xiphoid process and costal margins which mimics her pain  Will rule out gastropathy or peptic ulcer disease especially in light of recent chronic NSAID use. Patient has also had a positive breath test for H. Pylori-not convinced this is causing any of her current symptoms  #2 possible cecal mass on recent CT scan #3 status post hysterectomy #4 hypertension #5 chronic GERD #6 anxiety and depression #7 mild macrocytic anemia  Plan; Continue omeprazole 20 mg by mouth every morning  Patient  to stay off of meloxicam and all NSAIDs She will complete the current course of Cipro and Flagyl Schedule for upper endoscopy and colonoscopy with Dr. Fuller Plan. Both procedures were discussed in detail with the patient including risks and benefits and she is agreeable to proceed. Repeat CBC, check sedimentation rate/CRP Patient has been using tramadol at home for pain which she says hasn't been very helpful but has requested a refill, have given tramadol  50 mg every 6 hours when necessary for pain #50 and no refills She'll need to be treated at some point for H. pylori, May hold off and do this after her procedures as she is currently on another course of antibiotics, and they don't think H. pylori is accounting for her current symptoms.    Amy S Esterwood PA-C 08/02/2017   Cc: Orlena Sheldon, PA-C

## 2017-08-02 NOTE — Progress Notes (Signed)
Reviewed and agree with initial management plan.  Malcolm T. Stark, MD FACG 

## 2017-08-02 NOTE — Progress Notes (Signed)
ultr

## 2017-08-02 NOTE — Patient Instructions (Signed)
Please go to the basement level to have your labs drawn.  Continue Omeprazole 20 mg by mouth every morning. No aspirins or NSAIDS.   You may have mild anemia. You might be iron deficient- we are checking that today with labs. You have an abnormal CT scan/ mass at [facility].  We are scheduling an endoscopy and colonoscopy.    If you are age 58 or younger, your body mass index should be between 19-25. Your Body mass index is 32.03 kg/m. If this is out of the aformentioned range listed, please consider follow up with your Primary Care Provider.   You have been scheduled for a colonoscopy and endoscopy. Please follow written instructions given to you at your visit today.  Please pick up your prep supplies at the pharmacy within the next 1-3 days. If you use inhalers (even only as needed), please bring them with you on the day of your procedure. Your physician has requested that you go to www.startemmi.com and enter the access code given to you at your visit today. This web site gives a general overview about your procedure. However, you should still follow specific instructions given to you by our office regarding your preparation for the procedure.

## 2017-08-05 ENCOUNTER — Other Ambulatory Visit: Payer: Self-pay

## 2017-08-05 DIAGNOSIS — D649 Anemia, unspecified: Secondary | ICD-10-CM

## 2017-08-07 ENCOUNTER — Ambulatory Visit (INDEPENDENT_AMBULATORY_CARE_PROVIDER_SITE_OTHER): Payer: BLUE CROSS/BLUE SHIELD | Admitting: Orthopaedic Surgery

## 2017-08-07 ENCOUNTER — Encounter (INDEPENDENT_AMBULATORY_CARE_PROVIDER_SITE_OTHER): Payer: Self-pay | Admitting: Orthopaedic Surgery

## 2017-08-07 VITALS — BP 111/58 | HR 83 | Ht 60.0 in | Wt 158.0 lb

## 2017-08-07 DIAGNOSIS — M5136 Other intervertebral disc degeneration, lumbar region: Secondary | ICD-10-CM

## 2017-08-07 NOTE — Progress Notes (Signed)
Office Visit Note   Patient: Regina Richardson           Date of Birth: 1960/10/20           MRN: [id number] Visit Date: 08/07/2017              Requested by: Orlena Sheldon, PA-C [id number] [place], [place] [address] PCP: Orlena Sheldon, PA-C   Assessment & Plan: Visit Diagnoses:  1. Other intervertebral disc degeneration, lumbar region     Plan: She is looking into some aquatic therapy in [place].  She still has anxiety with driving particularly due to traffic.  I can check her back again in 2 months.  She is gotten some relief with epidural that she had last month.  Follow-Up Instructions: Return in about 2 months (around 10/05/2017).   Orders:  No orders of the defined types were placed in this encounter.  No orders of the defined types were placed in this encounter.     Procedures: No procedures performed   Clinical Data: No additional findings.   Subjective: Chief Complaint  Patient presents with  . Lower Back - Pain, Follow-up    HPI 57 year old female returns she states she is hurting all over.  She was recently diagnosed with an ulcer and has low hemoglobin and has upcoming endoscopy.  They have stopped all her anti-inflammatories she states her hands and feet are bothering her more stiffness in her fingers.  She has problems when she sleeps when she rolls over the right left side she has increased shoulder problems.  She is had previous left shoulder surgery.  She states she has been told she cannot take the tramadol cannot take any anti-inflammatories.  Patient states she is having whole body pain at this time.  Review of Systems review of systems updated unchanged from 05/07/2017 other than as mentioned in HPI.  Of note is positive anxiety and depression.   Objective: Vital Signs: BP (!) 111/58   Pulse 83   Ht 5' (1.524 m)   Wt 158 lb (71.7 kg)   BMI 30.86 kg/m   Physical Exam  Constitutional: She is oriented to person, place, and time. She  appears well-developed.  HENT:  Head: Normocephalic.  Right Ear: External ear normal.  Left Ear: External ear normal.  Eyes: Pupils are equal, round, and reactive to light.  Neck: No tracheal deviation present. No thyromegaly present.  Cardiovascular: Normal rate.  Pulmonary/Chest: Effort normal.  Abdominal: Soft.  Neurological: She is alert and oriented to person, place, and time.  Skin: Skin is warm and dry.  Psychiatric: She has a normal mood and affect. Her behavior is normal.    Ortho Exam normal heel toe gait.  Left distal leg 10 cm above the ankle shows a ballotable anterolateral 3 cm oval area which may be a ganglion.  Trace ankle swelling noted.  Negative logroll to the hips knees reach full extension.  No lower extremity pitting edema no rash over exposed skin.  She has some stiffness with flexion of fingers to distal palmar crease.  No thenar or hyperthenar atrophy.  She has some discomfort with left arm abduction overhead.  Specialty Comments:  No specialty comments available.  Imaging: No results found.   PMFS History: Patient Active Problem List   Diagnosis Date Noted  . Other intervertebral disc degeneration, lumbar region 05/08/2017  . Midline low back pain 11/22/2016  . Scoliosis 11/22/2016  . Smoker 09/07/2015  .  Essential hypertension 05/25/2015  . Insomnia 05/25/2015  . Anxiety and depression   . Depression    Past Medical History:  Diagnosis Date  . Alcoholism ([place])   . Anxiety   . Arrhythmia    heart  . Arthritis   . Cervical cancer ([place])   . Depression   . Diabetes ([place])   . Hypertension   . Sleep apnea     Family History  Problem Relation Age of Onset  . Alcohol abuse Mother   . Liver disease Mother   . Heart disease Father   . Alcohol abuse Father   . Diabetes Sister   . Liver disease Sister   . Diabetes Maternal Aunt   . Kidney disease Maternal Aunt   . Diabetes Cousin        mat cousin  . Breast cancer Neg Hx     Past Surgical  History:  Procedure Laterality Date  . ABDOMINAL HYSTERECTOMY     still has ovaries  . left collar bone surgery     had fx, pin placed and then complitcations caused them to remove most of the bone  . left forearm surgery     from left elbow down   Social History   Occupational History  . Occupation: retired  Tobacco Use  . Smoking status: Current Every Day Smoker    Packs/day: 0.50    Years: 40.00    Pack years: 20.00    Types: Cigarettes  . Smokeless tobacco: Never Used  Substance and Sexual Activity  . Alcohol use: Yes    Alcohol/week: 3.6 oz    Types: 6 Cans of beer per week  . Drug use: No  . Sexual activity: Not Currently

## 2017-08-08 ENCOUNTER — Other Ambulatory Visit: Payer: BLUE CROSS/BLUE SHIELD

## 2017-08-08 ENCOUNTER — Ambulatory Visit: Payer: BLUE CROSS/BLUE SHIELD | Admitting: Physician Assistant

## 2017-08-08 ENCOUNTER — Other Ambulatory Visit (INDEPENDENT_AMBULATORY_CARE_PROVIDER_SITE_OTHER): Payer: BLUE CROSS/BLUE SHIELD

## 2017-08-08 DIAGNOSIS — D649 Anemia, unspecified: Secondary | ICD-10-CM | POA: Diagnosis not present

## 2017-08-08 LAB — CBC WITH DIFFERENTIAL/PLATELET
Basophils Absolute: 0.1 10*3/uL (ref 0.0–0.1)
Basophils Relative: 0.9 % (ref 0.0–3.0)
Eosinophils Absolute: 0.1 10*3/uL (ref 0.0–0.7)
Eosinophils Relative: 0.9 % (ref 0.0–5.0)
HCT: 35.3 % — ABNORMAL LOW (ref 36.0–46.0)
Hemoglobin: 11.4 g/dL — ABNORMAL LOW (ref 12.0–15.0)
Lymphocytes Relative: 17.3 % (ref 12.0–46.0)
Lymphs Abs: 2.4 10*3/uL (ref 0.7–4.0)
MCHC: 32.3 g/dL (ref 30.0–36.0)
MCV: 96.5 fl (ref 78.0–100.0)
Monocytes Absolute: 0.9 10*3/uL (ref 0.1–1.0)
Monocytes Relative: 6.7 % (ref 3.0–12.0)
Neutro Abs: 10.2 10*3/uL — ABNORMAL HIGH (ref 1.4–7.7)
Neutrophils Relative %: 74.2 % (ref 43.0–77.0)
Platelets: 497 10*3/uL — ABNORMAL HIGH (ref 150.0–400.0)
RBC: 3.66 Mil/uL — ABNORMAL LOW (ref 3.87–5.11)
RDW: 12.9 % (ref 11.5–15.5)
WBC: 13.7 10*3/uL — ABNORMAL HIGH (ref 4.0–10.5)

## 2017-08-21 ENCOUNTER — Encounter: Payer: Self-pay | Admitting: Gastroenterology

## 2017-08-23 HISTORY — PX: COLONOSCOPY: SHX174

## 2017-08-23 HISTORY — PX: ESOPHAGOGASTRODUODENOSCOPY: SHX1529

## 2017-08-25 ENCOUNTER — Other Ambulatory Visit: Payer: Self-pay | Admitting: Physician Assistant

## 2017-08-26 NOTE — Telephone Encounter (Signed)
Refill appropriate 

## 2017-08-28 ENCOUNTER — Ambulatory Visit: Payer: BLUE CROSS/BLUE SHIELD | Admitting: Physician Assistant

## 2017-08-28 ENCOUNTER — Encounter: Payer: Self-pay | Admitting: Physician Assistant

## 2017-08-28 ENCOUNTER — Other Ambulatory Visit: Payer: Self-pay

## 2017-08-28 VITALS — BP 120/84 | HR 78 | Temp 98.0°F | Resp 16 | Ht 60.0 in | Wt 164.0 lb

## 2017-08-28 DIAGNOSIS — F32A Depression, unspecified: Secondary | ICD-10-CM

## 2017-08-28 DIAGNOSIS — R300 Dysuria: Secondary | ICD-10-CM | POA: Diagnosis not present

## 2017-08-28 DIAGNOSIS — F329 Major depressive disorder, single episode, unspecified: Secondary | ICD-10-CM | POA: Diagnosis not present

## 2017-08-28 DIAGNOSIS — B9689 Other specified bacterial agents as the cause of diseases classified elsewhere: Secondary | ICD-10-CM | POA: Diagnosis not present

## 2017-08-28 DIAGNOSIS — F419 Anxiety disorder, unspecified: Secondary | ICD-10-CM | POA: Diagnosis not present

## 2017-08-28 DIAGNOSIS — B373 Candidiasis of vulva and vagina: Secondary | ICD-10-CM | POA: Diagnosis not present

## 2017-08-28 DIAGNOSIS — N760 Acute vaginitis: Secondary | ICD-10-CM

## 2017-08-28 DIAGNOSIS — B3731 Acute candidiasis of vulva and vagina: Secondary | ICD-10-CM

## 2017-08-28 LAB — URINALYSIS, ROUTINE W REFLEX MICROSCOPIC
Bacteria, UA: NONE SEEN /HPF
Bilirubin Urine: NEGATIVE
Glucose, UA: NEGATIVE
Hgb urine dipstick: NEGATIVE
Ketones, ur: NEGATIVE
Nitrite: NEGATIVE
Protein, ur: NEGATIVE
RBC / HPF: NONE SEEN /HPF (ref 0–2)
Specific Gravity, Urine: 1.002 (ref 1.001–1.03)
pH: 6.5 (ref 5.0–8.0)

## 2017-08-28 LAB — WET PREP FOR TRICH, YEAST, CLUE

## 2017-08-28 LAB — MICROSCOPIC MESSAGE

## 2017-08-28 MED ORDER — CLONAZEPAM 1 MG PO TABS: 1.0000 mg | ORAL_TABLET | Freq: Two times a day (BID) | ORAL | 0 refills | Status: DC | PRN

## 2017-08-28 MED ORDER — FLUCONAZOLE 150 MG PO TABS
150.0000 mg | ORAL_TABLET | Freq: Once | ORAL | 0 refills | Status: AC
Start: 2017-08-28 — End: 2017-08-28

## 2017-08-28 MED ORDER — METRONIDAZOLE 500 MG PO TABS: 500.0000 mg | ORAL_TABLET | Freq: Two times a day (BID) | ORAL | 0 refills | Status: DC

## 2017-08-28 NOTE — Progress Notes (Signed)
Patient ID: Regina Richardson MRN: [id number], DOB: [birth year], 57 y.o. Date of Encounter: @DATE @  Chief Complaint:  Chief Complaint  Patient presents with  . discuss medication changes  . Dysuria  . Vaginal Discharge    HPI: 57 y.o. year old female  presents with  Above.  Today, once again, she discusses her husband and her stress related to him.  See prior notes for prior history and information regarding this.  Today I spent of full 40 minutes listening to the below history. Her information that she discusses today includes the following: "Now he is angry that we spent the $2000 on the CT that you ordered at that last visit----then he went and bought a $51,000 jeep and paid cash". Says that her family does not come to see her because they do not like him.  Says that her sister that lives all the way in [place] is going to be the one to have to take her to her endoscopy and colonoscopy because he will not take her. Also discusses that she has been trying to take care of her husband's father.  Says that they have him in a home that is filthy and does not take care of him.  Says that even though her own body hurts, she goes there and tries to get him clean and take care of him.  Says that she has been trying to put up ramps at the house etc. so that she can bring him to the house and take care of him.  Says that all her husband is interested in regarding his father is how much money he will get when he dies."  Says that she is trying to get disability and if she can get disability then she will leave him. Says that he will not let her talk to anyone on the phone and he won't let anyone visit.  She "has no Thanksgiving and has no Christmas."  Names off multiple siblings that she has that all have children and grandchildren.  She "has none."  Feels like she has absolutely no life.  Says that he will not even let her go to the grocery store.  He decides what she needs at the grocery store and does  not buy anything extra.  "Will not even by me any ice cream ".  Tells her that she is fat.  Says that she used her Christmas money and dyed her hair and he says "why did you can waste your money on that"  She continues with further examples of specific details. Talks about that she takes her cat and the litter box and everything into her room when she goes to sleep because he will get rid of it if there is any type of mess or anything from the cat or any excuse for him to get rid of that as well. Says that she is not allowed to have the computer on the couch or on the counter and names off other specific things that he gets mad at her about.  "But then he comes in dragging his suitcase across the floor and does not care that it is scratching the floor ".   Reports that she has been having burning itching and discharge -- vaginal.  No other concerns to address today.   Past Medical History:  Diagnosis Date  . Alcoholism ([place])   . Anxiety   . Arrhythmia    heart  . Arthritis   . Cervical cancer ([place])   .  Depression   . Diabetes ([place])   . Hypertension   . Sleep apnea      Home Meds: Outpatient Medications Prior to Visit  Medication Sig Dispense Refill  . buPROPion (WELLBUTRIN SR) 150 MG 12 hr tablet TAKE 1 TABLET BY MOUTH TWICE A DAY 60 tablet 1  . metoprolol succinate (TOPROL-XL) 50 MG 24 hr tablet TAKE 1 TABLET BY MOUTH EVERY DAY WITH OR IMMEDIATELY FOLLOWING A MEAL 90 tablet 3  . omeprazole (PRILOSEC) 20 MG capsule Take 1 capsule (20 mg total) by mouth daily. 30 capsule 5  . venlafaxine XR (EFFEXOR-XR) 150 MG 24 hr capsule TAKE ONE CAPSULE BY MOUTH IN THE MORNING W/BREAKFAST 30 capsule 0  . clonazePAM (KLONOPIN) 0.5 MG tablet TAKE 1 TABLET BY MOUTH TWICE A DAY AS NEEDED 60 tablet 2  . cyclobenzaprine (FLEXERIL) 10 MG tablet TAKE 1TAB 3 TIMES A DAY AS NEEDED FOR MUSCLE SPASMS. IF CAUSING DROWSINESS TAKE 1TAB AT BEDTIME ONLY (Patient not taking: Reported on 08/28/2017) 30 tablet 5  .  meloxicam (MOBIC) 7.5 MG tablet TAKE 1 TABLET BY MOUTH EVERY DAY WITH BREAKFAST (Patient not taking: Reported on 08/28/2017) 60 tablet 0  . traMADol (ULTRAM) 50 MG tablet Take 1 tab every 6-8 hours as needed for pain. (Patient not taking: Reported on 08/28/2017) 50 tablet 0  . ciprofloxacin (CIPRO) 500 MG tablet Take 1 tablet (500 mg total) by mouth 2 (two) times daily. 20 tablet 0  . meloxicam (MOBIC) 7.5 MG tablet TAKE 1 TABLET BY MOUTH DAILY WITH BREAKFAST. 30 tablet 2  . metroNIDAZOLE (FLAGYL) 500 MG tablet Take 1 tablet (500 mg total) by mouth 3 (three) times daily. 30 tablet 0  . traMADol (ULTRAM) 50 MG tablet TAKE 1 OR 2 TABLETS BY MOUTH AT BEDTIME AS NEEDED FOR PAIN (Patient not taking: Reported on 08/07/2017) 60 tablet 0   Facility-Administered Medications Prior to Visit  Medication Dose Route Frequency Provider Last Rate Last Dose  . methylPREDNISolone acetate (DEPO-MEDROL) injection 80 mg  80 mg Other Once Magnus Sinning, MD        Allergies: No Known Allergies  Social History   Socioeconomic History  . Marital status: Married    Spouse name: Not on file  . Number of children: 1  . Years of education: Not on file  . Highest education level: Not on file  Social Needs  . Financial resource strain: Not on file  . Food insecurity - worry: Not on file  . Food insecurity - inability: Not on file  . Transportation needs - medical: Not on file  . Transportation needs - non-medical: Not on file  Occupational History  . Occupation: retired  Tobacco Use  . Smoking status: Current Every Day Smoker    Packs/day: 0.50    Years: 40.00    Pack years: 20.00    Types: Cigarettes  . Smokeless tobacco: Never Used  Substance and Sexual Activity  . Alcohol use: Yes    Alcohol/week: 3.6 oz    Types: 6 Cans of beer per week  . Drug use: No  . Sexual activity: Not Currently  Other Topics Concern  . Not on file  Social History Narrative  . Not on file    Family History  Problem  Relation Age of Onset  . Alcohol abuse Mother   . Liver disease Mother   . Heart disease Father   . Alcohol abuse Father   . Diabetes Sister   . Liver disease Sister   .  Diabetes Maternal Aunt   . Kidney disease Maternal Aunt   . Diabetes Cousin        mat cousin  . Breast cancer Neg Hx      Review of Systems:  See HPI for pertinent ROS. All other ROS negative.    Physical Exam: Blood pressure 120/84, pulse 78, temperature 98 F (36.7 C), temperature source Oral, resp. rate 16, height 5' (1.524 m), weight 74.4 kg (164 lb), SpO2 98 %., Body mass index is 32.03 kg/m. General: Appears in no acute distress. Neck: Supple. No thyromegaly. No lymphadenopathy. Lungs: Clear bilaterally to auscultation without wheezes, rales, or rhonchi. Breathing is unlabored. Heart: RRR with S1 S2. No murmurs, rubs, or gallops. Abdomen: Soft, non-tender, non-distended with normoactive bowel sounds. No hepatomegaly. No rebound/guarding. No obvious abdominal masses.  Pelvic Exam: External genitalia normal.  Vaginal mucosa normal.  Bimanual exam is normal with no cervical motion tenderness.  There is some pasty white discharge on the external genitalia.  Small amount of vaginal discharge. Musculoskeletal:  Strength and tone normal for age. Extremities/Skin: Warm and dry.  Neuro: Alert and oriented X 3. Moves all extremities spontaneously. Gait is normal. CNII-XII grossly in tact. Psych: She wipes tears from her eyes through the visit but remains calm and controlled.  Responds to questions appropriately with a normal affect.      ASSESSMENT AND PLAN:  57 y.o. year old female with    1. Anxiety and depression Clearly, no amount of medication is going to fix this problem. Clearly, the only real resolution is for her to be able to leave her husband but she is not able to do so because of finances. She would benefit from counseling and therapy but has refused this secondary to cost and her husband being  angry of cost. Will increase the dose of Klonopin. Continue current dose of Effexor.  - clonazePAM (KLONOPIN) 1 MG tablet; Take 1 tablet (1 mg total) by mouth 2 (two) times daily as needed for anxiety.  Dispense: 60 tablet; Refill: 0  2. BV (bacterial vaginosis) Wet Prep shows clue cells as well as yeast. - metroNIDAZOLE (FLAGYL) 500 MG tablet; Take 1 tablet (500 mg total) by mouth 2 (two) times daily.  Dispense: 14 tablet; Refill: 0 - C. TRACHOMATIS/N. GONORRHOEAE RNA ([facility])  3. Vaginal candidiasis Wet Prep shows clue cells as well as yeast. - fluconazole (DIFLUCAN) 150 MG tablet; Take 1 tablet (150 mg total) by mouth once for 1 dose.  Dispense: 1 tablet; Refill: 0 - C. TRACHOMATIS/N. GONORRHOEAE RNA ([facility])  4. Dysuria UA normal. - Urinalysis, Routine w reflex microscopic  5. Vaginitis and vulvovaginitis - WET PREP FOR TRICH, YEAST, CLUE - C. TRACHOMATIS/N. GONORRHOEAE RNA ([facility])   40 minutes spent in direct patient contact, counseling, discussing treatment  Signed, Karis Juba, [place], BSFM 08/28/2017 4:08 PM

## 2017-08-29 LAB — C. TRACHOMATIS/N. GONORRHOEAE RNA
C. trachomatis RNA, TMA: NOT DETECTED
N. gonorrhoeae RNA, TMA: NOT DETECTED

## 2017-09-04 ENCOUNTER — Encounter: Payer: Self-pay | Admitting: Gastroenterology

## 2017-09-04 ENCOUNTER — Other Ambulatory Visit: Payer: Self-pay

## 2017-09-04 ENCOUNTER — Ambulatory Visit (AMBULATORY_SURGERY_CENTER): Payer: BLUE CROSS/BLUE SHIELD | Admitting: Gastroenterology

## 2017-09-04 VITALS — BP 154/67 | HR 87 | Temp 97.3°F | Resp 16 | Ht 60.0 in | Wt 164.0 lb

## 2017-09-04 DIAGNOSIS — D124 Benign neoplasm of descending colon: Secondary | ICD-10-CM

## 2017-09-04 DIAGNOSIS — D126 Benign neoplasm of colon, unspecified: Secondary | ICD-10-CM

## 2017-09-04 DIAGNOSIS — R1013 Epigastric pain: Secondary | ICD-10-CM | POA: Diagnosis not present

## 2017-09-04 DIAGNOSIS — K635 Polyp of colon: Secondary | ICD-10-CM | POA: Diagnosis not present

## 2017-09-04 DIAGNOSIS — D123 Benign neoplasm of transverse colon: Secondary | ICD-10-CM

## 2017-09-04 DIAGNOSIS — D649 Anemia, unspecified: Secondary | ICD-10-CM | POA: Diagnosis not present

## 2017-09-04 DIAGNOSIS — K2970 Gastritis, unspecified, without bleeding: Secondary | ICD-10-CM | POA: Diagnosis not present

## 2017-09-04 DIAGNOSIS — R933 Abnormal findings on diagnostic imaging of other parts of digestive tract: Secondary | ICD-10-CM | POA: Diagnosis not present

## 2017-09-04 DIAGNOSIS — K319 Disease of stomach and duodenum, unspecified: Secondary | ICD-10-CM | POA: Diagnosis not present

## 2017-09-04 MED ORDER — SODIUM CHLORIDE 0.9 % IV SOLN: 500.0000 mL | Freq: Once | INTRAVENOUS | Status: DC

## 2017-09-04 NOTE — Patient Instructions (Signed)
**  Handouts given on polyps, Gastritis and Esophagitis**   YOU HAD AN ENDOSCOPIC PROCEDURE TODAY: Refer to the procedure report and other information in the discharge instructions given to you for any specific questions about what was found during the examination. If this information does not answer your questions, please call [place] office at [phone] to clarify.   YOU SHOULD EXPECT: Some feelings of bloating in the abdomen. Passage of more gas than usual. Walking can help get rid of the air that was put into your GI tract during the procedure and reduce the bloating. If you had a lower endoscopy (such as a colonoscopy or flexible sigmoidoscopy) you may notice spotting of blood in your stool or on the toilet paper. Some abdominal soreness may be present for a day or two, also.  DIET: Your first meal following the procedure should be a light meal and then it is ok to progress to your normal diet. A half-sandwich or bowl of soup is an example of a good first meal. Heavy or fried foods are harder to digest and may make you feel nauseous or bloated. Drink plenty of fluids but you should avoid alcoholic beverages for 24 hours. If you had a esophageal dilation, please see attached instructions for diet.    ACTIVITY: Your care partner should take you home directly after the procedure. You should plan to take it easy, moving slowly for the rest of the day. You can resume normal activity the day after the procedure however YOU SHOULD NOT DRIVE, use power tools, machinery or perform tasks that involve climbing or major physical exertion for 24 hours (because of the sedation medicines used during the test).   SYMPTOMS TO REPORT IMMEDIATELY: A gastroenterologist can be reached at any hour. Please call [phone]  for any of the following symptoms:  Following lower endoscopy (colonoscopy, flexible sigmoidoscopy) Excessive amounts of blood in the stool  Significant tenderness, worsening of abdominal pains   Swelling of the abdomen that is new, acute  Fever of 100 or higher  Following upper endoscopy (EGD, EUS, ERCP, esophageal dilation) Vomiting of blood or coffee ground material  New, significant abdominal pain  New, significant chest pain or pain under the shoulder blades  Painful or persistently difficult swallowing  New shortness of breath  Black, tarry-looking or red, bloody stools  FOLLOW UP:  If any biopsies were taken you will be contacted by phone or by letter within the next 1-3 weeks. Call [phone]  if you have not heard about the biopsies in 3 weeks.  Please also call with any specific questions about appointments or follow up tests.

## 2017-09-04 NOTE — Progress Notes (Signed)
Called to room to assist during endoscopic procedure.  Patient ID and intended procedure confirmed with present staff. Received instructions for my participation in the procedure from the performing physician.  

## 2017-09-04 NOTE — Progress Notes (Signed)
Regina Palau CRNA reported after EGD that upper left side of lip bleeding.  MD aware. Angela/Procedure Room

## 2017-09-04 NOTE — Op Note (Signed)
[place] Patient Name: Regina Richardson Procedure Date: 09/04/2017 8:30 AM MRN: [id number] Endoscopist: Ladene Artist , MD Age: 57 Referring MD:  Date of Birth: 23-Jul-1961 Gender: Female Account #: [id number] Procedure:                Upper GI endoscopy Indications:              Epigastric abdominal pain, Iron deficiency anemia Medicines:                Monitored Anesthesia Care Procedure:                Pre-Anesthesia Assessment:                           - Prior to the procedure, a History and Physical                            was performed, and patient medications and                            allergies were reviewed. The patient's tolerance of                            previous anesthesia was also reviewed. The risks                            and benefits of the procedure and the sedation                            options and risks were discussed with the patient.                            All questions were answered, and informed consent                            was obtained. Prior Anticoagulants: The patient has                            taken no previous anticoagulant or antiplatelet                            agents. ASA Grade Assessment: II - A patient with                            mild systemic disease. After reviewing the risks                            and benefits, the patient was deemed in                            satisfactory condition to undergo the procedure.                           After obtaining informed consent, the endoscope was  passed under direct vision. Throughout the                            procedure, the patient's blood pressure, pulse, and                            oxygen saturations were monitored continuously. The                            Endoscope was introduced through the mouth, and                            advanced to the second part of duodenum. The upper                            GI  endoscopy was accomplished without difficulty.                            The patient tolerated the procedure well. Scope In: Scope Out: Findings:                 LA Grade B (one or more mucosal breaks greater than                            5 mm, not extending between the tops of two mucosal                            folds) esophagitis with no bleeding was found in                            the distal esophagus. Biopsies were taken with a                            cold forceps for histology.                           The exam of the esophagus was otherwise normal.                           Diffuse moderate inflammation characterized by                            congestion (edema) and granularity was found in the                            entire examined stomach. Biopsies were taken with a                            cold forceps for histology.                           The exam of the stomach was otherwise normal.  The duodenal bulb and second portion of the                            duodenum were normal. Biopsies for histology were                            taken with a cold forceps for evaluation of celiac                            disease. Complications:            No immediate complications. Estimated Blood Loss:     Estimated blood loss was minimal. Impression:               - LA Grade B reflux esophagitis. Biopsied.                           - Gastritis. Biopsied.                           - Normal duodenal bulb and second portion of the                            duodenum. Biopsied. Recommendation:           - Patient has a contact number available for                            emergencies. The signs and symptoms of potential                            delayed complications were discussed with the                            patient. Return to normal activities tomorrow.                            Written discharge instructions were provided to the                             patient.                           - Resume previous diet.                           - Continue present medications.                           - Await pathology results.                           - Return to GI office in 1 month. Ladene Artist, MD 09/04/2017 9:15:43 AM This report has been signed electronically.

## 2017-09-04 NOTE — Progress Notes (Signed)
Report given to PACU, vss 

## 2017-09-04 NOTE — Op Note (Signed)
[place] Patient Name: Kanon Colunga Procedure Date: 09/04/2017 8:31 AM MRN: [id number] Endoscopist: Ladene Artist , MD Age: 57 Referring MD:  Date of Birth: Nov 02, 1960 Gender: Female Account #: [id number] Procedure:                Colonoscopy Indications:              Abnormal CT of the GI tract (cecum) Medicines:                Monitored Anesthesia Care Procedure:                Pre-Anesthesia Assessment:                           - Prior to the procedure, a History and Physical                            was performed, and patient medications and                            allergies were reviewed. The patient's tolerance of                            previous anesthesia was also reviewed. The risks                            and benefits of the procedure and the sedation                            options and risks were discussed with the patient.                            All questions were answered, and informed consent                            was obtained. Prior Anticoagulants: The patient has                            taken no previous anticoagulant or antiplatelet                            agents. ASA Grade Assessment: II - A patient with                            mild systemic disease. After reviewing the risks                            and benefits, the patient was deemed in                            satisfactory condition to undergo the procedure.                           After obtaining informed consent, the colonoscope  was passed under direct vision. Throughout the                            procedure, the patient's blood pressure, pulse, and                            oxygen saturations were monitored continuously. The                            Colonoscope was introduced through the anus and                            advanced to the the cecum, identified by                            appendiceal orifice and ileocecal  valve. The                            ileocecal valve, appendiceal orifice, and rectum                            were photographed. The quality of the bowel                            preparation was good. The colonoscopy was performed                            without difficulty. The patient tolerated the                            procedure well. Scope In: 8:36:34 AM Scope Out: 8:58:17 AM Scope Withdrawal Time: 0 hours 19 minutes 38 seconds  Total Procedure Duration: 0 hours 21 minutes 43 seconds  Findings:                 The perianal and digital rectal examinations were                            normal.                           The ileocecal valve was prominent and moderately                            lipomatous.                           A 9 mm polyp was found in the hepatic flexure. The                            polyp was sessile. The polyp was removed with a hot                            snare. Resection and retrieval were complete.  A 8 mm polyp was found in the descending colon. The                            polyp was sessile. The polyp was removed with a                            cold snare. Resection and retrieval were complete.                           The exam was otherwise without abnormality on                            direct and retroflexion views. Complications:            No immediate complications. Estimated blood loss:                            None. Estimated Blood Loss:     Estimated blood loss: none. Impression:               - Prominent and lipomatous ileocecal valve.                           - One 9 mm polyp at the hepatic flexure, removed                            with a hot snare. Resected and retrieved.                           - One 8 mm polyp in the descending colon, removed                            with a cold snare. Resected and retrieved.                           - The examination was otherwise normal on direct                             and retroflexion views. Recommendation:           - Repeat colonoscopy in 5 years for surveillance if                            polyp(s) are precancerous, otherwise 10 years.                           - Patient has a contact number available for                            emergencies. The signs and symptoms of potential                            delayed complications were discussed with the  patient. Return to normal activities tomorrow.                            Written discharge instructions were provided to the                            patient.                           - Resume previous diet.                           - Continue present medications.                           - Await pathology results.                           - No aspirin, ibuprofen, naproxen, or other                            non-steroidal anti-inflammatory drugs for 2 weeks                            after polyp removal. Ladene Artist, MD 09/04/2017 9:12:10 AM This report has been signed electronically.

## 2017-09-05 ENCOUNTER — Other Ambulatory Visit: Payer: Self-pay | Admitting: Physician Assistant

## 2017-09-05 ENCOUNTER — Telehealth: Payer: Self-pay

## 2017-09-05 NOTE — Telephone Encounter (Signed)
  Follow up Call-  Call back number 09/04/2017  Post procedure Call Back phone  # [phone]  Permission to leave phone message Yes  Some recent data might be hidden     Patient questions:  Do you have a fever, pain , or abdominal swelling? No. Pain Score  0 *  Have you tolerated food without any problems? Yes.    Have you been able to return to your normal activities? Yes.    Do you have any questions about your discharge instructions: Diet   No. Medications  No. Follow up visit  No.  Do you have questions or concerns about your Care? No.  Actions: * If pain score is 4 or above: No action needed, pain <4.

## 2017-09-21 ENCOUNTER — Encounter: Payer: Self-pay | Admitting: Gastroenterology

## 2017-09-23 ENCOUNTER — Other Ambulatory Visit: Payer: Self-pay

## 2017-09-23 MED ORDER — TRAMADOL HCL 50 MG PO TABS: ORAL_TABLET | ORAL | 0 refills | Status: DC

## 2017-09-23 NOTE — Telephone Encounter (Signed)
Last OV 08/28/2017 Last refill 08/02/2017 Ok to refill  Patient is also requesting refills on this medication

## 2017-09-23 NOTE — Telephone Encounter (Signed)
Call placed to patient she is aware medication has been sent to her pharmacy

## 2017-10-07 NOTE — Telephone Encounter (Signed)
Pt chart opened in error.

## 2017-10-08 ENCOUNTER — Ambulatory Visit (INDEPENDENT_AMBULATORY_CARE_PROVIDER_SITE_OTHER): Payer: BLUE CROSS/BLUE SHIELD | Admitting: Orthopaedic Surgery

## 2017-10-08 ENCOUNTER — Encounter (INDEPENDENT_AMBULATORY_CARE_PROVIDER_SITE_OTHER): Payer: Self-pay | Admitting: Orthopaedic Surgery

## 2017-10-08 VITALS — BP 116/72 | HR 92 | Ht 60.0 in | Wt 150.0 lb

## 2017-10-08 DIAGNOSIS — W5501XA Bitten by cat, initial encounter: Secondary | ICD-10-CM | POA: Diagnosis not present

## 2017-10-08 DIAGNOSIS — M545 Low back pain, unspecified: Secondary | ICD-10-CM

## 2017-10-08 DIAGNOSIS — S91052A Open bite, left ankle, initial encounter: Secondary | ICD-10-CM

## 2017-10-08 NOTE — Addendum Note (Signed)
Addended by: Meyer Cory on: 10/08/2017 08:58 AM   Modules accepted: Orders
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
## 2017-10-10 ENCOUNTER — Other Ambulatory Visit (INDEPENDENT_AMBULATORY_CARE_PROVIDER_SITE_OTHER): Payer: Self-pay | Admitting: Radiology

## 2017-10-10 LAB — ANA: Anti Nuclear Antibody(ANA): NEGATIVE

## 2017-10-10 LAB — SEDIMENTATION RATE: Sed Rate: 19 mm/h (ref 0–30)

## 2017-10-10 LAB — URIC ACID: Uric Acid, Serum: 7.1 mg/dL — ABNORMAL HIGH (ref 2.5–7.0)

## 2017-10-10 LAB — RHEUMATOID FACTOR: Rhuematoid fact SerPl-aCnc: <14 IU/mL (ref <14)

## 2017-10-10 MED ORDER — ALLOPURINOL 100 MG PO TABS: ORAL_TABLET | ORAL | 0 refills | Status: DC

## 2017-10-17 ENCOUNTER — Other Ambulatory Visit: Payer: Self-pay | Admitting: Physician Assistant

## 2017-10-17 DIAGNOSIS — F419 Anxiety disorder, unspecified: Principal | ICD-10-CM

## 2017-10-17 DIAGNOSIS — F32A Depression, unspecified: Secondary | ICD-10-CM

## 2017-10-17 DIAGNOSIS — F329 Major depressive disorder, single episode, unspecified: Secondary | ICD-10-CM

## 2017-10-17 NOTE — Telephone Encounter (Signed)
Last OV 08/28/2017 Last refill  Tramadol 09/23/2017 Klonopin 08/28/2017 Ok to refill?

## 2017-11-18 ENCOUNTER — Other Ambulatory Visit: Payer: Self-pay | Admitting: Physician Assistant

## 2017-11-18 ENCOUNTER — Other Ambulatory Visit (INDEPENDENT_AMBULATORY_CARE_PROVIDER_SITE_OTHER): Payer: Self-pay | Admitting: Orthopaedic Surgery

## 2017-11-19 MED ORDER — ALLOPURINOL 300 MG PO TABS: 300.0000 mg | ORAL_TABLET | Freq: Every day | ORAL | 4 refills | Status: DC

## 2017-11-19 NOTE — Telephone Encounter (Signed)
Sent to pharmacy.  Can you please call and make patient follow up appointment with Dr. Lorin Mercy in a couple of weeks? Thanks.

## 2017-11-19 NOTE — Telephone Encounter (Signed)
Last OV 08/28/17 Last refill tramadol 10/17/17 Venlafaxine XR 06/28/17  Okay to refill?

## 2017-11-19 NOTE — Telephone Encounter (Signed)
Ok for refill? 

## 2017-11-19 NOTE — Telephone Encounter (Signed)
Appt 12/04/17 @ 9:30am Dr Lorin Mercy

## 2017-11-19 NOTE — Telephone Encounter (Signed)
I called, send in allopurinol 300mg  # 30   Refill times 4 .   Make her appt in a couple weeks. Same pharmacy

## 2017-12-04 ENCOUNTER — Encounter (INDEPENDENT_AMBULATORY_CARE_PROVIDER_SITE_OTHER): Payer: Self-pay | Admitting: Orthopaedic Surgery

## 2017-12-04 ENCOUNTER — Ambulatory Visit (INDEPENDENT_AMBULATORY_CARE_PROVIDER_SITE_OTHER): Payer: BLUE CROSS/BLUE SHIELD | Admitting: Orthopaedic Surgery

## 2017-12-04 VITALS — BP 128/72 | HR 84

## 2017-12-04 DIAGNOSIS — M65352 Trigger finger, left little finger: Secondary | ICD-10-CM

## 2017-12-04 MED ORDER — BUPIVACAINE HCL 0.5 % IJ SOLN
0.3300 mL | INTRAMUSCULAR | Status: AC | PRN
  Administered 2017-12-04: .33 mL

## 2017-12-04 MED ORDER — LIDOCAINE HCL 1 % IJ SOLN
0.3000 mL | INTRAMUSCULAR | Status: AC | PRN
  Administered 2017-12-04: .3 mL

## 2017-12-04 MED ORDER — BUPIVACAINE HCL 0.25 % IJ SOLN
0.3300 mL | INTRAMUSCULAR | Status: AC | PRN
  Administered 2017-12-04: .33 mL

## 2017-12-04 NOTE — Progress Notes (Signed)
Office Visit Note   Patient: Regina Richardson           Date of Birth: 1961/01/28           MRN: [id number] Visit Date: 12/04/2017              Requested by: Regina Sheldon, PA-C [address], [place] [address] PCP: Regina Sheldon, PA-C   Assessment & Plan: Visit Diagnoses:  1. Trigger little finger of left hand   2.    Hyperuricemia on allopurinol.  Plan: Injection performed left A1 pulley fifth finger for repetitive triggering.  She tolerated the injection well.  I will check her back again in 2 months and when she returns we will draw a uric acid.  She is on allopurinol 300 mg daily.  She had a cat bite to her left ankle which is still slowly healing.  Follow-Up Instructions: Return if symptoms worsen or fail to improve.   Orders:  Orders Placed This Encounter  Procedures  . Hand/UE Inj: L small A1   No orders of the defined types were placed in this encounter.     Procedures: Hand/UE Inj: L small A1 for trigger finger on 12/04/2017 10:45 AM Medications: 0.33 mL bupivacaine 0.25 %; 0.33 mL bupivacaine 0.5 %; 0.3 mL lidocaine 1 %      Clinical Data: No additional findings.   Subjective: Chief Complaint  Patient presents with  . Lower Back - Pain  . Left Ankle - Pain    HPI patient returns with ongoing pain problems in her neck some problems with pain in her shoulders she has some swelling in the lower extremities possibly related to meloxicam.  She had hyperuricemia and gradually is ramped up allopurinol is down 300 mg daily.  She has not had a uric acid repeat.  She is had triggering in her left fifth finger that wakes her up at night she has to manually extend it.  Is been painful with grabbing activities.  She also has some pain in her ankles and pain in her feet that bothers her with walking.  Review of Systems 14 point review of systems updated and unchanged from last office visit.   Objective: Vital Signs: BP 128/72   Pulse 84   Physical  Exam  Constitutional: She is oriented to person, place, and time. She appears well-developed.  HENT:  Head: Normocephalic.  Right Ear: External ear normal.  Left Ear: External ear normal.  Eyes: Pupils are equal, round, and reactive to light.  Neck: No tracheal deviation present. No thyromegaly present.  Cardiovascular: Normal rate.  Pulmonary/Chest: Effort normal.  Abdominal: Soft.  Neurological: She is alert and oriented to person, place, and time.  Skin: Skin is warm and dry.  Psychiatric: She has a normal mood and affect. Her behavior is normal.    Ortho Exam patient has tenderness of the left little finger A1 pulley.  With manual flexion she locks and had to be manually extended.  Cat bite left distal leg anteriorly is not draining.  She still has some mild edema both lower extremities.  Multiple superficial scratches are present over her legs which are healed.  Specialty Comments:  No specialty comments available.  Imaging: No results found.   PMFS History: Patient Active Problem List   Diagnosis Date Noted  . Other intervertebral disc degeneration, lumbar region 05/08/2017  . Midline low back pain 11/22/2016  . Scoliosis 11/22/2016  . Smoker 09/07/2015  .  Essential hypertension 05/25/2015  . Insomnia 05/25/2015  . Anxiety and depression   . Depression    Past Medical History:  Diagnosis Date  . Alcoholism ([place])   . Anxiety   . Arrhythmia    heart  . Arthritis   . Cervical cancer ([place])   . Depression   . Diabetes ([place])    no medications taken, pt is unsure of dx  . GERD (gastroesophageal reflux disease)   . Hypertension   . Osteoporosis   . Sleep apnea    no CPAP    Family History  Problem Relation Age of Onset  . Alcohol abuse Mother   . Liver disease Mother   . Heart disease Father   . Alcohol abuse Father   . Diabetes Sister   . Liver disease Sister   . Diabetes Maternal Aunt   . Kidney disease Maternal Aunt   . Diabetes Cousin        mat  cousin  . Breast cancer Neg Hx   . Colon cancer Neg Hx   . Esophageal cancer Neg Hx   . Stomach cancer Neg Hx   . Rectal cancer Neg Hx     Past Surgical History:  Procedure Laterality Date  . ABDOMINAL HYSTERECTOMY     still has ovaries  . COLONOSCOPY    . left collar bone surgery     had fx, pin placed and then complitcations caused them to remove most of the bone  . left forearm surgery     from left elbow down   Social History   Occupational History  . Occupation: retired  Tobacco Use  . Smoking status: Current Every Day Smoker    Packs/day: 0.25    Years: 40.00    Pack years: 10.00    Types: Cigarettes  . Smokeless tobacco: Never Used  Substance and Sexual Activity  . Alcohol use: Yes    Alcohol/week: 3.6 oz    Types: 6 Cans of beer per week  . Drug use: No  . Sexual activity: Not Currently

## 2018-01-10 ENCOUNTER — Other Ambulatory Visit: Payer: Self-pay | Admitting: Physician Assistant

## 2018-01-10 DIAGNOSIS — F17200 Nicotine dependence, unspecified, uncomplicated: Secondary | ICD-10-CM

## 2018-01-10 DIAGNOSIS — F419 Anxiety disorder, unspecified: Secondary | ICD-10-CM

## 2018-01-10 NOTE — Telephone Encounter (Signed)
OK to refill

## 2018-01-10 NOTE — Telephone Encounter (Signed)
Ok to refill to last 1 year-- until February. Approved.

## 2018-01-13 ENCOUNTER — Other Ambulatory Visit: Payer: Self-pay | Admitting: Physician Assistant

## 2018-01-14 NOTE — Telephone Encounter (Signed)
Last OV  07/31/2017 Last refill on tramadol 11/20/2017 Flexeril 04/24/2017  Ok to refill

## 2018-02-04 ENCOUNTER — Ambulatory Visit (INDEPENDENT_AMBULATORY_CARE_PROVIDER_SITE_OTHER): Payer: BLUE CROSS/BLUE SHIELD | Admitting: Orthopaedic Surgery

## 2018-02-05 ENCOUNTER — Ambulatory Visit (INDEPENDENT_AMBULATORY_CARE_PROVIDER_SITE_OTHER): Payer: BLUE CROSS/BLUE SHIELD | Admitting: Surgery

## 2018-02-05 ENCOUNTER — Encounter (INDEPENDENT_AMBULATORY_CARE_PROVIDER_SITE_OTHER): Payer: Self-pay | Admitting: Surgery

## 2018-02-05 ENCOUNTER — Ambulatory Visit (INDEPENDENT_AMBULATORY_CARE_PROVIDER_SITE_OTHER): Payer: BLUE CROSS/BLUE SHIELD

## 2018-02-05 ENCOUNTER — Other Ambulatory Visit: Payer: Self-pay | Admitting: Physician Assistant

## 2018-02-05 VITALS — BP 142/73 | HR 72 | Ht 60.0 in | Wt 150.0 lb

## 2018-02-05 DIAGNOSIS — M545 Low back pain: Secondary | ICD-10-CM

## 2018-02-05 DIAGNOSIS — F17200 Nicotine dependence, unspecified, uncomplicated: Secondary | ICD-10-CM

## 2018-02-05 DIAGNOSIS — M2550 Pain in unspecified joint: Secondary | ICD-10-CM | POA: Diagnosis not present

## 2018-02-05 DIAGNOSIS — G8929 Other chronic pain: Secondary | ICD-10-CM | POA: Diagnosis not present

## 2018-02-05 DIAGNOSIS — F419 Anxiety disorder, unspecified: Secondary | ICD-10-CM

## 2018-02-05 MED ORDER — METHYLPREDNISOLONE 4 MG PO TABS: ORAL_TABLET | ORAL | 0 refills | Status: DC

## 2018-02-07 LAB — CBC WITH DIFFERENTIAL
Basophils Absolute: 0.1 10*3/uL (ref 0.0–0.2)
Basos: 1 %
EOS (ABSOLUTE): 0.3 10*3/uL (ref 0.0–0.4)
Eos: 3 %
Hematocrit: 40.3 % (ref 34.0–46.6)
Hemoglobin: 13.1 g/dL (ref 11.1–15.9)
Immature Grans (Abs): 0.1 10*3/uL (ref 0.0–0.1)
Immature Granulocytes: 1 %
Lymphocytes Absolute: 2.4 10*3/uL (ref 0.7–3.1)
Lymphs: 24 %
MCH: 32.9 pg (ref 26.6–33.0)
MCHC: 32.5 g/dL (ref 31.5–35.7)
MCV: 101 fL — ABNORMAL HIGH (ref 79–97)
Monocytes Absolute: 0.9 10*3/uL (ref 0.1–0.9)
Monocytes: 9 %
Neutrophils Absolute: 6.3 10*3/uL (ref 1.4–7.0)
Neutrophils: 62 %
RBC: 3.98 x10E6/uL (ref 3.77–5.28)
RDW: 13.5 % (ref 12.3–15.4)
WBC: 10 10*3/uL (ref 3.4–10.8)

## 2018-02-07 LAB — SEDIMENTATION RATE

## 2018-02-07 LAB — ANA: Anti Nuclear Antibody(ANA): NEGATIVE

## 2018-02-07 LAB — HLA-B27 ANTIGEN: HLA-B27 Antigen: NEGATIVE

## 2018-02-07 LAB — RHEUMATOID FACTOR: Rhuematoid fact SerPl-aCnc: <14 IU/mL (ref <14)

## 2018-02-07 LAB — URIC ACID: Uric Acid, Serum: 3 mg/dL (ref 2.5–7.0)

## 2018-02-07 LAB — C-REACTIVE PROTEIN: CRP: 23.7 mg/L — ABNORMAL HIGH (ref <8.0)

## 2018-02-11 ENCOUNTER — Other Ambulatory Visit: Payer: Self-pay | Admitting: Physician Assistant

## 2018-02-13 ENCOUNTER — Other Ambulatory Visit: Payer: Self-pay | Admitting: Physician Assistant

## 2018-02-19 ENCOUNTER — Ambulatory Visit (INDEPENDENT_AMBULATORY_CARE_PROVIDER_SITE_OTHER): Payer: BLUE CROSS/BLUE SHIELD | Admitting: Orthopaedic Surgery

## 2018-02-19 ENCOUNTER — Other Ambulatory Visit: Payer: Self-pay | Admitting: Physician Assistant

## 2018-02-19 VITALS — BP 139/77 | HR 73 | Ht 60.0 in | Wt 160.0 lb

## 2018-02-19 DIAGNOSIS — R2242 Localized swelling, mass and lump, left lower limb: Secondary | ICD-10-CM

## 2018-02-19 DIAGNOSIS — F419 Anxiety disorder, unspecified: Principal | ICD-10-CM

## 2018-02-19 DIAGNOSIS — F32A Depression, unspecified: Secondary | ICD-10-CM

## 2018-02-19 DIAGNOSIS — F329 Major depressive disorder, single episode, unspecified: Secondary | ICD-10-CM

## 2018-02-19 NOTE — Telephone Encounter (Signed)
Ok to refill??  Last office visit 08/28/2017.  Last refill 10/17/2017, #2 refills.

## 2018-02-21 ENCOUNTER — Encounter (INDEPENDENT_AMBULATORY_CARE_PROVIDER_SITE_OTHER): Payer: Self-pay | Admitting: Orthopaedic Surgery

## 2018-02-21 ENCOUNTER — Telehealth: Payer: Self-pay

## 2018-02-21 NOTE — Telephone Encounter (Signed)
Pharmacy is aware

## 2018-02-21 NOTE — Telephone Encounter (Signed)
Received fax from pharmacy asking for clarification that prescriber is aware of risk for using clonazepam and tramdol together

## 2018-02-21 NOTE — Progress Notes (Signed)
Office Visit Note   Patient: Regina Richardson           Date of Birth: Jan 31, 1961           MRN: [id number] Visit Date: 02/19/2018              Requested by: Orlena Sheldon, PA-C [address], [place] [address] PCP: Orlena Sheldon, PA-C   Assessment & Plan: Visit Diagnoses:  1. Ankle mass, left     Plan: Distal leg adjacent ankle anterolateral mass with patient attempted incision to drain the mass to fix her problem.  She still has pain tenderness has an incision which has some erythema without definite cellulitis and no purulent drainage.  She states is giving her considerable pain problems with ambulation on a daily basis wakes her up at night at times.  We will proceed with MRI scan for evaluation and office follow-up after scan for review.  I discussed with her recommendations to see a psychologist or psychiatrist for multiple problems with anxiety and depression as well as attempt to do surgery on herself.  Office follow-up after MRI scan.  Follow-Up Instructions: No follow-ups on file.   Orders:  Orders Placed This Encounter  Procedures  . MR Ankle Left w/o contrast   No orders of the defined types were placed in this encounter.     Procedures: No procedures performed   Clinical Data: No additional findings.   Subjective: Chief Complaint  Patient presents with  . Lower Back - Follow-up, Pain    HPI 57 year old female returns with chronic low back pain that radiates into her thighs.  She is had some problems with swelling over the left ankle anterolaterally originally described a problem with her But admits she she tried to open and have it drained on her own with a knife.  She is been taking tramadol and low-dose aspirin and states is not really helping.  She is had bilateral foot pain worse in the left than right.  She admits to considerable problems with anxiety, depression, problems with anxiety with driving, marital problems.  Since her attempted  drainage she is had persistent prominent swelling tenderness some erythema no current purulent drainage but did have some drainage for short period time and still has some prominence anterolateral an area that is 3 x 3 cm appears to be subcutaneous.  Review of Systems 14 point review of systems updated unchanged from 05/07/2017 other than as mentioned in HPI.  Significant for anxiety and depression attempted draining her own cyst in her leg.   Objective: Vital Signs: BP 139/77   Pulse 73   Ht 5' (1.524 m)   Wt 160 lb (72.6 kg)   BMI 31.25 kg/m   Physical Exam  Constitutional: She is oriented to person, place, and time. She appears well-developed.  HENT:  Head: Normocephalic.  Right Ear: External ear normal.  Left Ear: External ear normal.  Eyes: Pupils are equal, round, and reactive to light.  Neck: No tracheal deviation present. No thyromegaly present.  Cardiovascular: Normal rate.  Pulmonary/Chest: Effort normal.  Abdominal: Soft.  Neurological: She is alert and oriented to person, place, and time.  Skin: Skin is warm and dry.  Psychiatric: She has a normal mood and affect. Her behavior is normal.    Ortho Exam patient has some left ankle anterolateral swelling with his a transverse incision which is healed with surrounding swelling 3 x 3 cm tenderness with palpation and subcutaneous  prominence which may be a ganglion versus hematoma.  Negative for compartment syndrome anterior tib is active.  Ankle exam shows negative anterior drawer.  Posterior tib, peroneals, anterior tib are active.  Distal pulses are 2+ and symmetrical.  Negative straight leg raising negative logroll to the hips.  Specialty Comments:  No specialty comments available.  Imaging: No results found.   PMFS History: Patient Active Problem List   Diagnosis Date Noted  . Other intervertebral disc degeneration, lumbar region 05/08/2017  . Midline low back pain 11/22/2016  . Scoliosis 11/22/2016  . Smoker  09/07/2015  . Essential hypertension 05/25/2015  . Insomnia 05/25/2015  . Anxiety and depression   . Depression    Past Medical History:  Diagnosis Date  . Alcoholism ([place])   . Anxiety   . Arrhythmia    heart  . Arthritis   . Cervical cancer ([place])   . Depression   . Diabetes ([place])    no medications taken, pt is unsure of dx  . GERD (gastroesophageal reflux disease)   . Hypertension   . Osteoporosis   . Sleep apnea    no CPAP    Family History  Problem Relation Age of Onset  . Alcohol abuse Mother   . Liver disease Mother   . Heart disease Father   . Alcohol abuse Father   . Diabetes Sister   . Liver disease Sister   . Diabetes Maternal Aunt   . Kidney disease Maternal Aunt   . Diabetes Cousin        mat cousin  . Breast cancer Neg Hx   . Colon cancer Neg Hx   . Esophageal cancer Neg Hx   . Stomach cancer Neg Hx   . Rectal cancer Neg Hx     Past Surgical History:  Procedure Laterality Date  . ABDOMINAL HYSTERECTOMY     still has ovaries  . COLONOSCOPY    . left collar bone surgery     had fx, pin placed and then complitcations caused them to remove most of the bone  . left forearm surgery     from left elbow down   Social History   Occupational History  . Occupation: retired  Tobacco Use  . Smoking status: Current Every Day Smoker    Packs/day: 0.25    Years: 40.00    Pack years: 10.00    Types: Cigarettes  . Smokeless tobacco: Never Used  Substance and Sexual Activity  . Alcohol use: Yes    Alcohol/week: 3.6 oz    Types: 6 Cans of beer per week  . Drug use: No  . Sexual activity: Not Currently

## 2018-02-21 NOTE — Telephone Encounter (Signed)
Approved.  

## 2018-03-10 ENCOUNTER — Other Ambulatory Visit: Payer: Self-pay | Admitting: Physician Assistant

## 2018-03-10 DIAGNOSIS — Z1231 Encounter for screening mammogram for malignant neoplasm of breast: Secondary | ICD-10-CM

## 2018-03-12 ENCOUNTER — Ambulatory Visit (INDEPENDENT_AMBULATORY_CARE_PROVIDER_SITE_OTHER): Payer: BLUE CROSS/BLUE SHIELD | Admitting: Orthopaedic Surgery

## 2018-03-20 ENCOUNTER — Telehealth (INDEPENDENT_AMBULATORY_CARE_PROVIDER_SITE_OTHER): Payer: Self-pay | Admitting: Orthopaedic Surgery

## 2018-03-20 NOTE — Telephone Encounter (Signed)
Read and noted thanks

## 2018-03-20 NOTE — Telephone Encounter (Signed)
Please advise 

## 2018-03-20 NOTE — Telephone Encounter (Signed)
Patient said she was summoned for jury duty and wanted Dr. Lorin Mercy to write her an excuse for this. She said she cant sit for long periods and doesn't know if shes going to need to walk a long distance as far as parking goes. She comes in 9/17 for her MRI review and said she will bring her letter then. Patients # [phone]

## 2018-03-25 NOTE — Progress Notes (Signed)
Office Visit Note   Patient: Regina Richardson           Date of Birth: 10/16/1960           MRN: [id number] Visit Date: 02/05/2018              Requested by: Orlena Sheldon, PA-C [address] [place], [place] [address] PCP: Orlena Sheldon, PA-C   Assessment & Plan: Visit Diagnoses:  1. Chronic midline low back pain, with sciatica presence unspecified   2. Polyarthralgia     Plan: With patient's chronic low back pain and polyarthralgias true blood work today to check a CBC and arthritis panel.  Follow-up with Dr. Lorin Mercy in a couple of weeks to discuss results.  Patient also given Medrol Dosepak 6-day taper to be taken as directed.  Follow-Up Instructions: Return in about 2 weeks (around 02/19/2018) for With Dr. Lorin Mercy to review labs and recheck.   Orders:  Orders Placed This Encounter  Procedures  . XR Lumbar Spine 2-3 Views  . CBC With Differential  . Rheumatoid Factor  . Antinuclear Antib (ANA)  . Sed Rate (ESR)  . Uric acid  . HLA-B27 antigen  . C-reactive protein   Meds ordered this encounter  Medications  . methylPREDNISolone (MEDROL) 4 MG tablet    Sig: 6-day taper to be taken as directed    Dispense:  21 tablet    Refill:  0      Procedures: No procedures performed   Clinical Data: No additional findings.   Subjective: Chief Complaint  Patient presents with  . Lower Back - Follow-up    HPI 57 year old female comes in with complaints of low back pain, ankle pain and shoulder pain.  Chronic low back pain secondary to lumbar degenerative disc disease.  Lumbar MRI performed February 08, 2017 and that report showed:  CLINICAL DATA:  Low back pain and bilateral leg pain.  EXAM: MRI LUMBAR SPINE WITHOUT CONTRAST  TECHNIQUE: Multiplanar, multisequence MR imaging of the lumbar spine was performed. No intravenous contrast was administered.  COMPARISON:  11/15/2016  FINDINGS: Segmentation:  Standard.  Alignment:  Physiologic.  Vertebrae:  No  fracture, evidence of discitis, or bone lesion.  Conus medullaris: Extends to the T12-L1 level and appears normal.  Paraspinal and other soft tissues: 2.1 cm nodule in the left adrenal gland, most likely a benign adenoma. Otherwise negative.  Disc levels:  L1-2: Minimal disc bulge to the right of midline with no neural impingement.  L2-3: Minimal disc bulge to the right of midline with no neural impingement.  L3-4:  Small broad-based disc bulge without neural impingement.  L4-5: Small broad-based disc bulge slightly asymmetric to the left with slight narrowing of the left lateral recess which might affect the left L5 nerve. Slight thickening of the ligamentum flavum.  L5-S1:  Tiny central disc bulge with no neural impingement.  IMPRESSION: Minimal degenerative disc disease in the lumbar spine. Slight lateral recess narrowing at L4-5 which could affect the left L5 nerve.   Electronically Signed   By: Lorriane Shire M.D.   On: 02/08/2017 12:16   States that back symptoms are unchanged.  She is also complaining of pain in the right upper arm and also burning in the left ankle.  Pain in the ankle when she weight bears.  She has taken tramadol without relief.  Also localizes her back pain to the SI joints. Review of Systems No current cardiac pulmonary GI GU issues  Objective: Vital Signs: BP (!) 142/73   Pulse 72   Ht 5' (1.524 m)   Wt 150 lb (68 kg)   BMI 29.29 kg/m   Physical Exam  Constitutional: She is oriented to person, place, and time. No distress.  HENT:  Head: Normocephalic and atraumatic.  Eyes: EOM are normal.  Pulmonary/Chest: No respiratory distress.  Musculoskeletal:  Gait is somewhat antalgic.  She is moderately tender over the bilateral SI joints.  Positive Tinel's right cubital tunnel.  Good elbow range of motion.  Negative straight leg raise.  Left ankle does have some swelling.  Tender across the anterior tibiotalar joint line.  .    Neurological: She is alert and oriented to person, place, and time.  Skin: Skin is warm and dry.    Ortho Exam  Specialty Comments:  No specialty comments available.  Imaging: No results found.   PMFS History: Patient Active Problem List   Diagnosis Date Noted  . Other intervertebral disc degeneration, lumbar region 05/08/2017  . Midline low back pain 11/22/2016  . Scoliosis 11/22/2016  . Smoker 09/07/2015  . Essential hypertension 05/25/2015  . Insomnia 05/25/2015  . Anxiety and depression   . Depression    Past Medical History:  Diagnosis Date  . Alcoholism ([place])   . Anxiety   . Arrhythmia    heart  . Arthritis   . Cervical cancer ([place])   . Depression   . Diabetes ([place])    no medications taken, pt is unsure of dx  . GERD (gastroesophageal reflux disease)   . Hypertension   . Osteoporosis   . Sleep apnea    no CPAP    Family History  Problem Relation Age of Onset  . Alcohol abuse Mother   . Liver disease Mother   . Heart disease Father   . Alcohol abuse Father   . Diabetes Sister   . Liver disease Sister   . Diabetes Maternal Aunt   . Kidney disease Maternal Aunt   . Diabetes Cousin        mat cousin  . Breast cancer Neg Hx   . Colon cancer Neg Hx   . Esophageal cancer Neg Hx   . Stomach cancer Neg Hx   . Rectal cancer Neg Hx     Past Surgical History:  Procedure Laterality Date  . ABDOMINAL HYSTERECTOMY     still has ovaries  . COLONOSCOPY    . left collar bone surgery     had fx, pin placed and then complitcations caused them to remove most of the bone  . left forearm surgery     from left elbow down   Social History   Occupational History  . Occupation: retired  Tobacco Use  . Smoking status: Current Every Day Smoker    Packs/day: 0.25    Years: 40.00    Pack years: 10.00    Types: Cigarettes  . Smokeless tobacco: Never Used  Substance and Sexual Activity  . Alcohol use: Yes    Alcohol/week: 6.0 standard drinks    Types: 6 Cans  of beer per week  . Drug use: No  . Sexual activity: Not Currently

## 2018-03-28 ENCOUNTER — Telehealth (INDEPENDENT_AMBULATORY_CARE_PROVIDER_SITE_OTHER): Payer: Self-pay | Admitting: Orthopaedic Surgery

## 2018-03-28 NOTE — Telephone Encounter (Signed)
Please see below. OK to keep appointment on 9/17 and order plain film x-ray?

## 2018-03-28 NOTE — Telephone Encounter (Signed)
I called patient and advised. 

## 2018-03-28 NOTE — Telephone Encounter (Signed)
7Patient called and stated that she cancelled her MRI because she was requested to pay $1000 and she does not have that.  I explained to her that the appt on 9/17 was set to review the MRI and patient wants to know if she can still come see Lorin Mercy anyway so he can xray her ankle.  She wants to hear from someone asap.  [phone]

## 2018-03-28 NOTE — Telephone Encounter (Signed)
Ok to keep appt.

## 2018-03-29 ENCOUNTER — Other Ambulatory Visit: Payer: BLUE CROSS/BLUE SHIELD

## 2018-04-08 ENCOUNTER — Ambulatory Visit (INDEPENDENT_AMBULATORY_CARE_PROVIDER_SITE_OTHER): Payer: BLUE CROSS/BLUE SHIELD | Admitting: Orthopaedic Surgery

## 2018-04-08 ENCOUNTER — Ambulatory Visit (INDEPENDENT_AMBULATORY_CARE_PROVIDER_SITE_OTHER): Payer: BLUE CROSS/BLUE SHIELD

## 2018-04-08 ENCOUNTER — Encounter (INDEPENDENT_AMBULATORY_CARE_PROVIDER_SITE_OTHER): Payer: Self-pay | Admitting: Orthopaedic Surgery

## 2018-04-08 VITALS — BP 141/81 | HR 73 | Ht 60.0 in | Wt 160.0 lb

## 2018-04-08 DIAGNOSIS — M25572 Pain in left ankle and joints of left foot: Secondary | ICD-10-CM | POA: Diagnosis not present

## 2018-04-08 NOTE — Progress Notes (Signed)
Office Visit Note   Patient: Regina Richardson           Date of Birth: 09/16/1960           MRN: [id number] Visit Date: 04/08/2018              Requested by: Orlena Sheldon, PA-C [address], [place] [address] PCP: Orlena Sheldon, PA-C   Assessment & Plan: Visit Diagnoses:  1. Pain in left ankle and joints of left foot     Plan: We will place patient in some TED hose to help with the swelling she likely has some superficial venous dilation.  She can call if she would like to proceed with MRI scan but at present time she has symmetrical areas that appear to be some superficial venous dilation and had O should help with the soreness that occurs by the end of the afternoon.  I encouraged her to continue to work on a walking program.  She needs to make sure she keeps her appointment for treatment of her depression and anxiety which is significant.  Office follow-up PRN.  Follow-Up Instructions: Return if symptoms worsen or fail to improve.   Orders:  Orders Placed This Encounter  Procedures  . XR Ankle Complete Left   No orders of the defined types were placed in this encounter.     Procedures: No procedures performed   Clinical Data: No additional findings.   Subjective: Chief Complaint  Patient presents with  . Left Ankle - Pain    HPI 57 year old female returns with continued problems with subcutaneous prominence anterolateral ankle 6 to 7 cm proximal to the joint which she previously tried to drain on her own.  She has a corresponding area on her opposite ankle which may represent a dilated vein or possible ganglion.  An MRI been ordered at her request but she states she could not pay the thousand dollars co-pay since her husband handles the finances.  Swelling is down in the morning it tends to increase slightly.  She is used tramadol without relief.  Review of Systems positive for significant anxiety and depression on antidepressant medication.  She has  significant anxiety with driving problems with insomnia wakes up before her alarm goes off.  She is been on several different antidepressants.  Positive for scoliosis and lumbar degenerative changes.  Some aortoiliac arteriosclerotic vascular disease.  Otherwise negative is a pertains HPI.   Objective: Vital Signs: BP (!) 141/81   Pulse 73   Ht 5' (1.524 m)   Wt 160 lb (72.6 kg)   BMI 31.25 kg/m   Physical Exam  Constitutional: She is oriented to person, place, and time. She appears well-developed.  HENT:  Head: Normocephalic.  Right Ear: External ear normal.  Left Ear: External ear normal.  Eyes: Pupils are equal, round, and reactive to light.  Neck: No tracheal deviation present. No thyromegaly present.  Cardiovascular: Normal rate.  Pulmonary/Chest: Effort normal.  Abdominal: Soft.  Neurological: She is alert and oriented to person, place, and time.  Skin: Skin is warm and dry.  Psychiatric: She has a normal mood and affect. Her behavior is normal.    Ortho Exam patient is a 3 x 2 cm area left distal anterior calf over anterior compartment and subtenons tissue which is ballotable.  Scars present where she is previously had tried to drain it herself.  No cellulitis.  No pitting edema.  Anterior tib gastrocsoleus distal pulses are  intact.  No lymphadenopathy.  She has slight increased discoloration anteriorly over the distal tibia without pitting edema. Specialty Comments:  No specialty comments available.  Imaging: Xr Ankle Complete Left  Result Date: 04/08/2018 AP lateral and mortise x-ray left ankle obtained and reviewed.  This shows no acute bone changes no arthritic changes. Impression: Normal left ankle x-rays    PMFS History: Patient Active Problem List   Diagnosis Date Noted  . Other intervertebral disc degeneration, lumbar region 05/08/2017  . Midline low back pain 11/22/2016  . Scoliosis 11/22/2016  . Smoker 09/07/2015  . Essential hypertension 05/25/2015  .  Insomnia 05/25/2015  . Anxiety and depression   . Depression    Past Medical History:  Diagnosis Date  . Alcoholism ([place])   . Anxiety   . Arrhythmia    heart  . Arthritis   . Cervical cancer ([place])   . Depression   . Diabetes ([place])    no medications taken, pt is unsure of dx  . GERD (gastroesophageal reflux disease)   . Hypertension   . Osteoporosis   . Sleep apnea    no CPAP    Family History  Problem Relation Age of Onset  . Alcohol abuse Mother   . Liver disease Mother   . Heart disease Father   . Alcohol abuse Father   . Diabetes Sister   . Liver disease Sister   . Diabetes Maternal Aunt   . Kidney disease Maternal Aunt   . Diabetes Cousin        mat cousin  . Breast cancer Neg Hx   . Colon cancer Neg Hx   . Esophageal cancer Neg Hx   . Stomach cancer Neg Hx   . Rectal cancer Neg Hx     Past Surgical History:  Procedure Laterality Date  . ABDOMINAL HYSTERECTOMY     still has ovaries  . COLONOSCOPY    . left collar bone surgery     had fx, pin placed and then complitcations caused them to remove most of the bone  . left forearm surgery     from left elbow down   Social History   Occupational History  . Occupation: retired  Tobacco Use  . Smoking status: Current Every Day Smoker    Packs/day: 0.25    Years: 40.00    Pack years: 10.00    Types: Cigarettes  . Smokeless tobacco: Never Used  Substance and Sexual Activity  . Alcohol use: Yes    Alcohol/week: 6.0 standard drinks    Types: 6 Cans of beer per week  . Drug use: No  . Sexual activity: Not Currently

## 2018-04-09 ENCOUNTER — Ambulatory Visit
Admission: RE | Admit: 2018-04-09 | Discharge: 2018-04-09 | Disposition: A | Discharge status: Home / Self Care | Payer: BLUE CROSS/BLUE SHIELD | Source: Ambulatory Visit | Attending: Physician Assistant | Admitting: Physician Assistant

## 2018-04-09 DIAGNOSIS — Z1231 Encounter for screening mammogram for malignant neoplasm of breast: Secondary | ICD-10-CM

## 2018-04-10 ENCOUNTER — Encounter: Payer: Self-pay | Admitting: Physician Assistant

## 2018-04-10 ENCOUNTER — Ambulatory Visit (INDEPENDENT_AMBULATORY_CARE_PROVIDER_SITE_OTHER): Payer: BLUE CROSS/BLUE SHIELD | Admitting: Physician Assistant

## 2018-04-10 VITALS — BP 140/84 | HR 73 | Temp 98.2°F | Resp 16 | Ht 60.0 in | Wt 169.2 lb

## 2018-04-10 DIAGNOSIS — N898 Other specified noninflammatory disorders of vagina: Secondary | ICD-10-CM | POA: Diagnosis not present

## 2018-04-10 DIAGNOSIS — B373 Candidiasis of vulva and vagina: Secondary | ICD-10-CM | POA: Diagnosis not present

## 2018-04-10 DIAGNOSIS — Z23 Encounter for immunization: Secondary | ICD-10-CM | POA: Diagnosis not present

## 2018-04-10 DIAGNOSIS — Z124 Encounter for screening for malignant neoplasm of cervix: Secondary | ICD-10-CM

## 2018-04-10 DIAGNOSIS — B3731 Acute candidiasis of vulva and vagina: Secondary | ICD-10-CM

## 2018-04-10 LAB — WET PREP FOR TRICH, YEAST, CLUE

## 2018-04-10 MED ORDER — FLUCONAZOLE 150 MG PO TABS: 150.0000 mg | ORAL_TABLET | Freq: Once | ORAL | 0 refills | Status: AC

## 2018-04-10 NOTE — Progress Notes (Signed)
Patient ID: Regina Richardson MRN: [id number], DOB: June 03, 1961, 57 y.o. Date of Encounter: @DATE @  Chief Complaint:  Chief Complaint  Patient presents with  . Pap Smear  . Vaginal Discharge    HPI: 57 y.o. year old female  presents with above.    Reviewed that at prior CPE with me she reported that she had had hysterectomy and that it was not performed secondary to cancer.  Therefore we discussed that no further Pap smear indicated. Reviewed this information with her and discussed whether we needed to proceed with Pap smear today. She responds that she received letter from her insurance company that she was due for Pap smear. She also reports that [place] that she had an abnormal Pap smear at one point. After discussion, decided we would go ahead and proceed with Pap smear today.  Also she reports that she has noticed some discharge on the toilet paper when she has been wiping after urinating recently.  Had no pelvic pain fevers or chills.  Has just recently noticed some discharge but wanted to check this as well.  No other concerns to address today.      Past Medical History:  Diagnosis Date  . Alcoholism ([place])   . Anxiety   . Arrhythmia    heart  . Arthritis   . Cervical cancer ([place])   . Depression   . Diabetes ([place])    no medications taken, pt is unsure of dx  . GERD (gastroesophageal reflux disease)   . Hypertension   . Osteoporosis   . Sleep apnea    no CPAP     Home Meds: Outpatient Medications Prior to Visit  Medication Sig Dispense Refill  . allopurinol (ZYLOPRIM) 100 MG tablet Start in one week.  Take one tablet (100mg ) daily x one week. Then increase to two tabs daily. 60 tablet 0  . allopurinol (ZYLOPRIM) 300 MG tablet Take 1 tablet (300 mg total) by mouth daily. 30 tablet 4  . buPROPion (WELLBUTRIN SR) 150 MG 12 hr tablet TAKE 1 TABLET BY MOUTH TWICE A DAY 60 tablet 1  . clonazePAM (KLONOPIN) 1 MG tablet TAKE 1 TABLET BY MOUTH 2  TIMES DAILY AS NEEDED FOR ANXIETY. 60 tablet 2  . cyclobenzaprine (FLEXERIL) 10 MG tablet TAKE 1TAB 3 TIMES A DAY AS NEEDED FOR MUSCLE SPASMS. IF CAUSING DROWSINESS TAKE 1TAB AT BEDTIME ONLY 30 tablet 5  . meloxicam (MOBIC) 7.5 MG tablet TAKE 1 TABLET BY MOUTH EVERY DAY WITH BREAKFAST 60 tablet 0  . metoprolol succinate (TOPROL-XL) 50 MG 24 hr tablet TAKE 1 TABLET BY MOUTH EVERY DAY WITH OR IMMEDIATELY FOLLOWING A MEAL 90 tablet 3  . omeprazole (PRILOSEC) 20 MG capsule TAKE 1 CAPSULE BY MOUTH EVERY DAY 30 capsule 5  . traMADol (ULTRAM) 50 MG tablet TAKE 1 TABLET BY MOUTH EVERY 6 TO 8 HOURS AS NEEDED FOR PAIN 50 tablet 2  . venlafaxine XR (EFFEXOR-XR) 150 MG 24 hr capsule TAKE ONE CAPSULE BY MOUTH IN THE MORNING W/BREAKFAST 30 capsule 0  . buPROPion (WELLBUTRIN SR) 150 MG 12 hr tablet TAKE 1 TABLET BY MOUTH TWICE A DAY 180 tablet 3  . buPROPion (WELLBUTRIN SR) 150 MG 12 hr tablet TAKE 1 TABLET BY MOUTH TWICE A DAY 60 tablet 3  . meloxicam (MOBIC) 7.5 MG tablet TAKE 1 TABLET BY MOUTH DAILY WITH BREAKFAST. 90 tablet 0  . methylPREDNISolone (MEDROL DOSEPAK) 4 MG TBPK tablet See admin instructions.  0  .  methylPREDNISolone (MEDROL) 4 MG tablet 6-day taper to be taken as directed (Patient not taking: Reported on 02/19/2018) 21 tablet 0  . venlafaxine XR (EFFEXOR-XR) 150 MG 24 hr capsule TAKE ONE CAPSULE BY MOUTH EVERY MORNING WITH BREAKFAST 90 capsule 0   Facility-Administered Medications Prior to Visit  Medication Dose Route Frequency Provider Last Rate Last Dose  . 0.9 %  sodium chloride infusion  500 mL Intravenous Once Ladene Artist, MD      . methylPREDNISolone acetate (DEPO-MEDROL) injection 80 mg  80 mg Other Once Magnus Sinning, MD        Allergies: No Known Allergies  Social History   Socioeconomic History  . Marital status: Married    Spouse name: Not on file  . Number of children: 1  . Years of education: Not on file  . Highest education level: Not on file  Occupational History   . Occupation: retired  Scientific laboratory technician  . Financial resource strain: Not on file  . Food insecurity:    Worry: Not on file    Inability: Not on file  . Transportation needs:    Medical: Not on file    Non-medical: Not on file  Tobacco Use  . Smoking status: Current Every Day Smoker    Packs/day: 0.25    Years: 40.00    Pack years: 10.00    Types: Cigarettes  . Smokeless tobacco: Never Used  Substance and Sexual Activity  . Alcohol use: Yes    Alcohol/week: 6.0 standard drinks    Types: 6 Cans of beer per week  . Drug use: No  . Sexual activity: Not Currently  Lifestyle  . Physical activity:    Days per week: Not on file    Minutes per session: Not on file  . Stress: Not on file  Relationships  . Social connections:    Talks on phone: Not on file    Gets together: Not on file    Attends religious service: Not on file    Active member of club or organization: Not on file    Attends meetings of clubs or organizations: Not on file    Relationship status: Not on file  . Intimate partner violence:    Fear of current or ex partner: Not on file    Emotionally abused: Not on file    Physically abused: Not on file    Forced sexual activity: Not on file  Other Topics Concern  . Not on file  Social History Narrative  . Not on file    Family History  Problem Relation Age of Onset  . Alcohol abuse Mother   . Liver disease Mother   . Heart disease Father   . Alcohol abuse Father   . Diabetes Sister   . Liver disease Sister   . Diabetes Maternal Aunt   . Kidney disease Maternal Aunt   . Diabetes Cousin        mat cousin  . Breast cancer Neg Hx   . Colon cancer Neg Hx   . Esophageal cancer Neg Hx   . Stomach cancer Neg Hx   . Rectal cancer Neg Hx      Review of Systems:  See HPI for pertinent ROS. All other ROS negative.    Physical Exam: Blood pressure 140/84, pulse 73, temperature 98.2 F (36.8 C), temperature source Oral, resp. rate 16, height 5' (1.524 m),  weight 76.7 kg, SpO2 97 %., Body mass index is 33.04 kg/m. General:  WNWD  WF. Appears in no acute distress. Neck: Supple. No thyromegaly. No lymphadenopathy. Lungs: Clear bilaterally to auscultation without wheezes, rales, or rhonchi. Breathing is unlabored. Heart: RRR with S1 S2. No murmurs, rubs, or gallops. Pelvic Exam: External genitalia appears normal.  Vaginal mucosa appears slightly swollen.  A small amount of pasty white discharge present towards the external vaginal area.  Remainder of exam appears normal.  Bimanual exam is normal with no masses. Musculoskeletal:  Strength and tone normal for age. Extremities/Skin: Warm and dry.  Neuro: Alert and oriented X 3. Moves all extremities spontaneously. Gait is normal. CNII-XII grossly in tact. Psych:  Responds to questions appropriately with a normal affect.     ASSESSMENT AND PLAN:  57 y.o. year old female with   1. Pap smear for cervical cancer screening - PAP, Thin Prep w/HPV rflx HPV Type 16/18  2. Vaginal discharge - C. trachomatis/N. gonorrhoeae RNA - WET PREP FOR TRICH, YEAST, CLUE  3. Vaginal candidiasis Wet Prep does show some yeast.  She is to treat with Diflucan.  Follow-up if needed. - fluconazole (DIFLUCAN) 150 MG tablet; Take 1 tablet (150 mg total) by mouth once for 1 dose.  Dispense: 1 tablet; Refill: 0      Signed, [address] [place], [place], [facility] 04/10/2018 11:07 AM

## 2018-04-11 LAB — PAP, TP IMAGING W/ HPV RNA, RFLX HPV TYPE 16,18/45: HPV DNA High Risk: NOT DETECTED

## 2018-04-11 LAB — C. TRACHOMATIS/N. GONORRHOEAE RNA
C. trachomatis RNA, TMA: NOT DETECTED
N. gonorrhoeae RNA, TMA: NOT DETECTED

## 2018-04-17 ENCOUNTER — Other Ambulatory Visit (INDEPENDENT_AMBULATORY_CARE_PROVIDER_SITE_OTHER): Payer: Self-pay | Admitting: Orthopaedic Surgery

## 2018-04-17 NOTE — Telephone Encounter (Signed)
Ok for refill? 

## 2018-05-05 ENCOUNTER — Telehealth: Payer: Self-pay

## 2018-05-05 IMAGING — MR MR LUMBAR SPINE W/O CM
5 series · 48 of 48 positions shown · non-contrast
Comparison: 11/15/2016

CLINICAL DATA: Low back pain and bilateral leg pain.

EXAM:
MRI LUMBAR SPINE WITHOUT CONTRAST
TECHNIQUE: Multiplanar, multisequence MR imaging of the lumbar spine was
performed. No intravenous contrast was administered.

[Series 3: T2 · sagittal · 4.0mm · 0.94mm/px · 6 of 15 slices shown (1 of 2)]
[im 1/15]
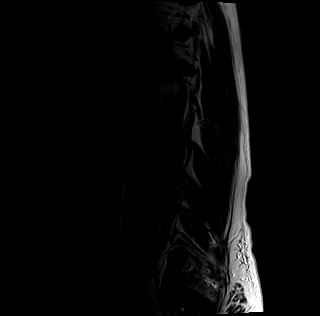
[im 3/15]
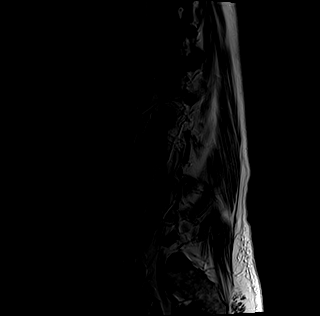
[im 6/15]
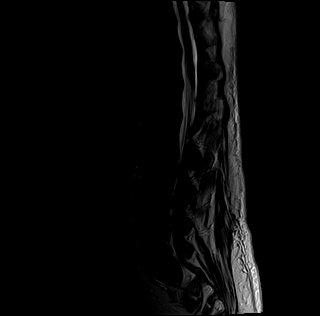
[im 9/15]
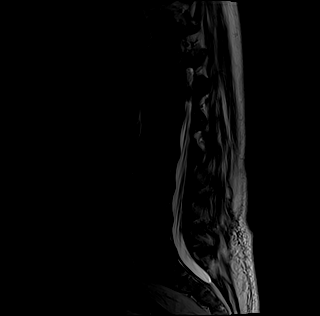
[im 12/15]
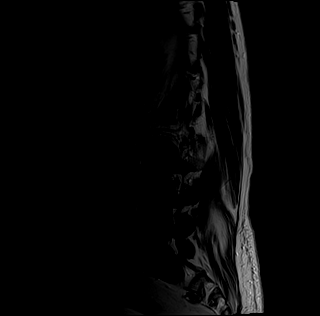
[im 15/15]
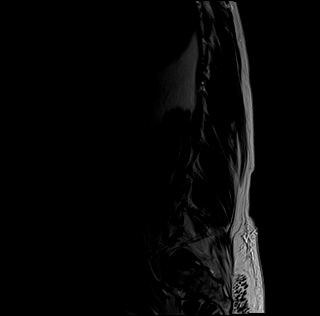

[Series 4: T1 · sagittal · 4.0mm · 0.94mm/px · 5 of 15 slices shown (1 of 2)]
[im 1/15]
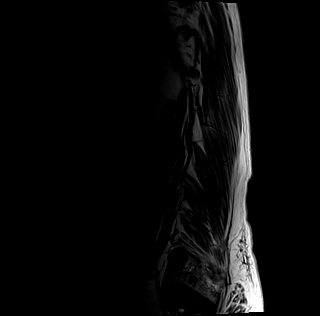
[im 4/15]
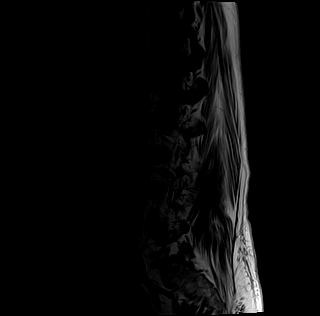
[im 8/15]
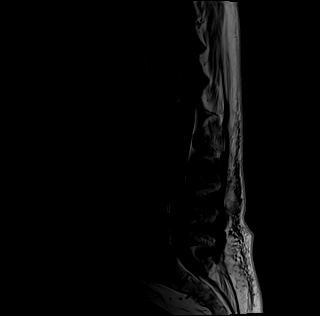
[im 11/15]
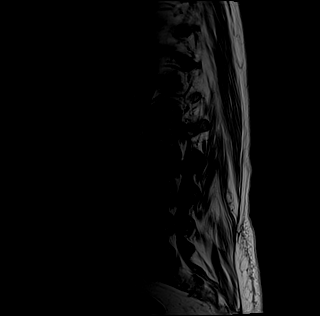
[im 15/15]
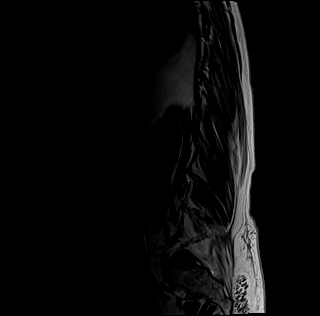

[Series 5: tirm sag · sagittal · 4.0mm · 0.59mm/px · 5 of 15 slices shown]
[im 1/15]
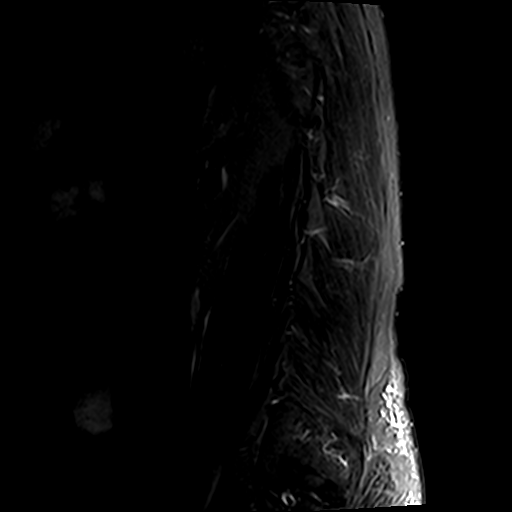
[im 4/15]
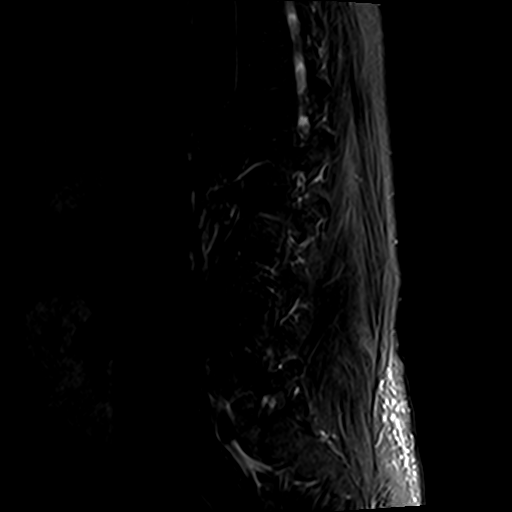
[im 8/15]
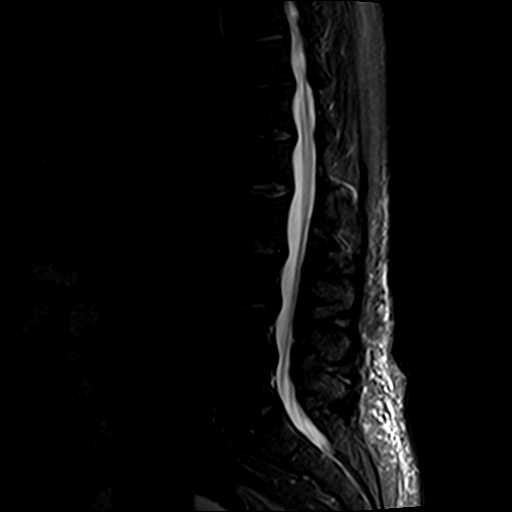
[im 11/15]
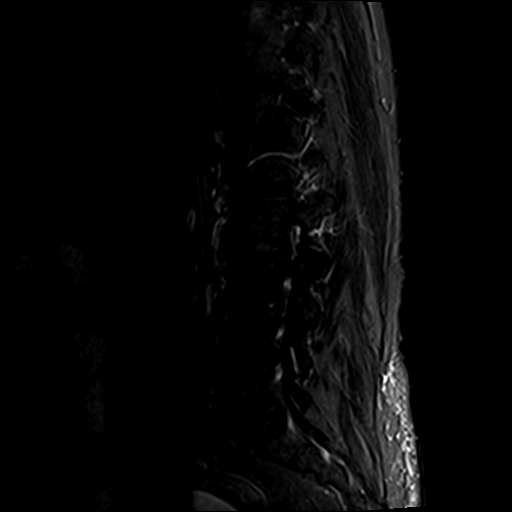
[im 15/15]
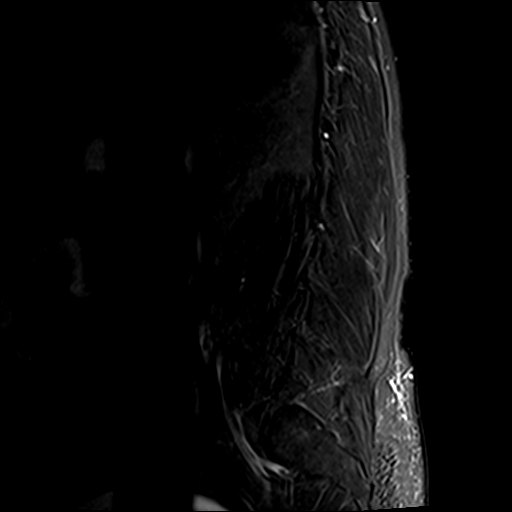

[Series 6: T1 · axial · 4.0mm · 0.78mm/px · z∈[-40,+206]mm · 16 of 49 slices shown (2 of 2)]
[im 1/49]
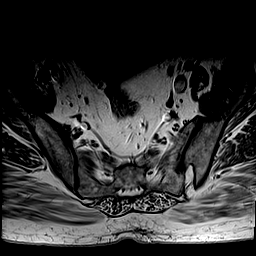
[im 4/49]
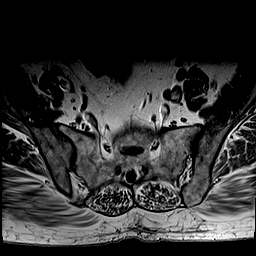
[im 7/49]
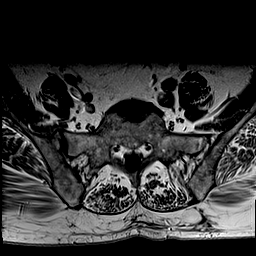
[im 10/49]
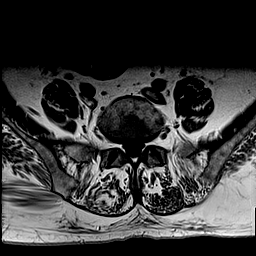
[im 13/49]
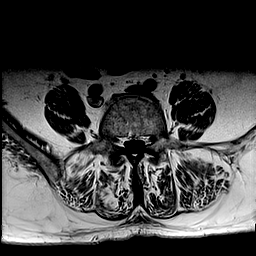
[im 17/49]
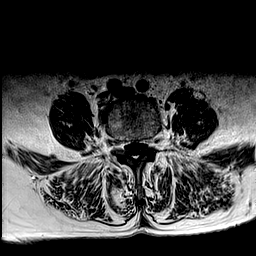
[im 20/49]
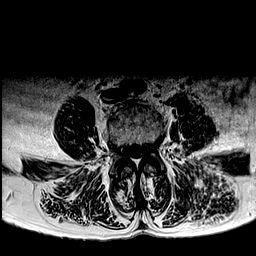
[im 23/49]
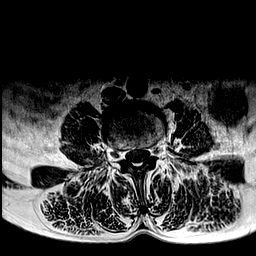
[im 26/49]
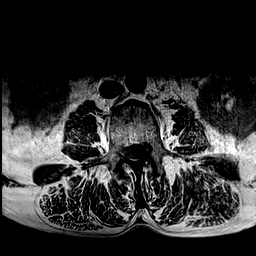
[im 29/49]
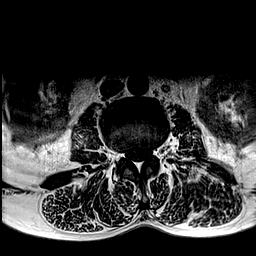
[im 33/49]
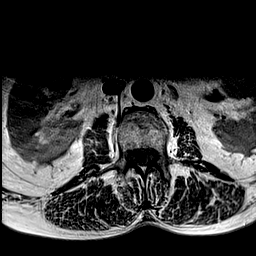
[im 36/49]
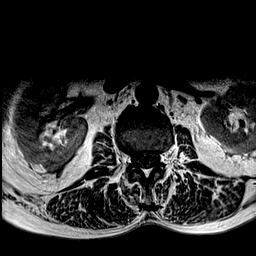
[im 39/49]
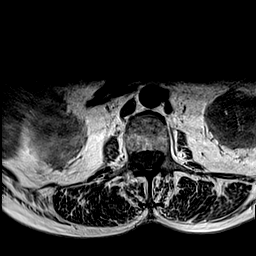
[im 42/49]
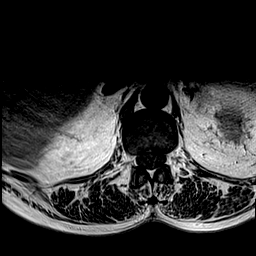
[im 45/49]
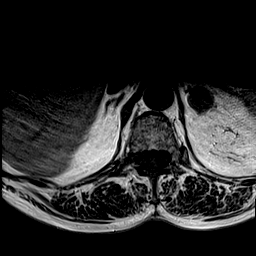
[im 49/49]
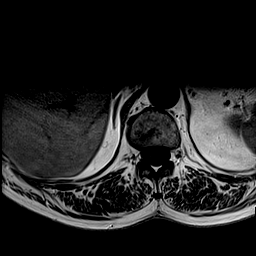

[Series 7: T2 · axial · 4.0mm · 0.78mm/px · z∈[-40,+206]mm · 16 of 49 slices shown (2 of 2)]
[im 1/49]
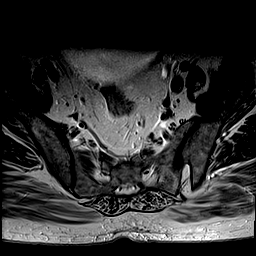
[im 4/49]
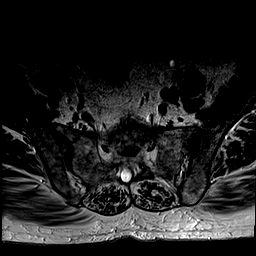
[im 7/49]
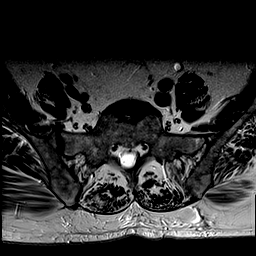
[im 10/49]
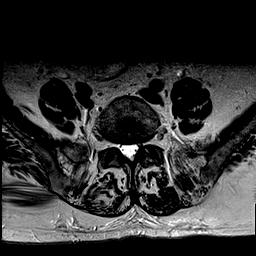
[im 13/49]
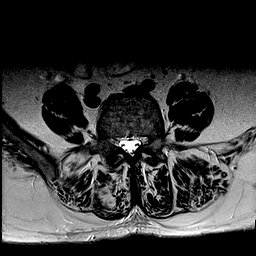
[im 17/49]
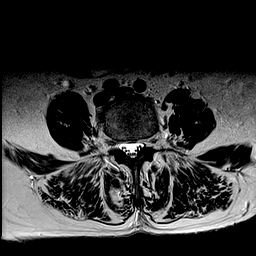
[im 20/49]
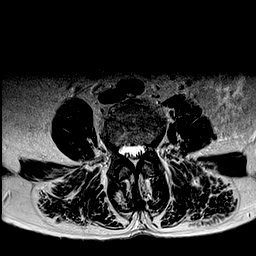
[im 23/49]
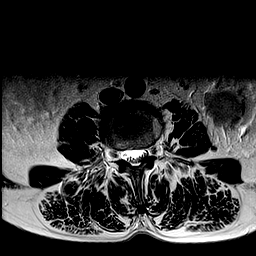
[im 26/49]
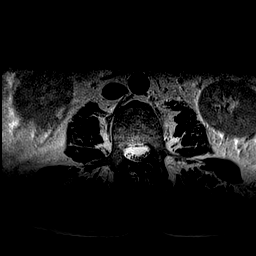
[im 29/49]
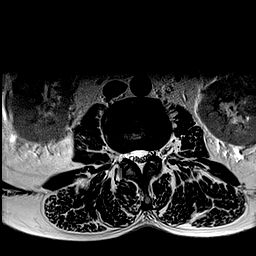
[im 33/49]
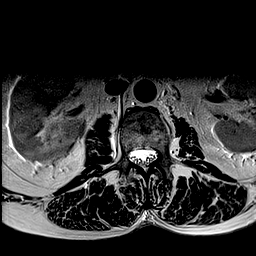
[im 36/49]
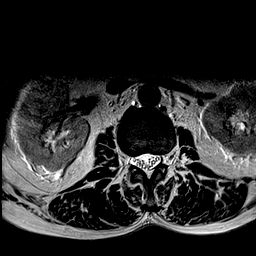
[im 39/49]
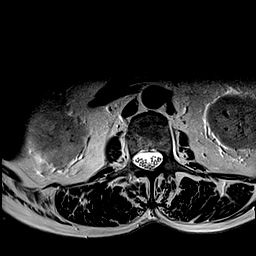
[im 42/49]
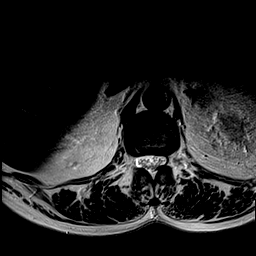
[im 45/49]
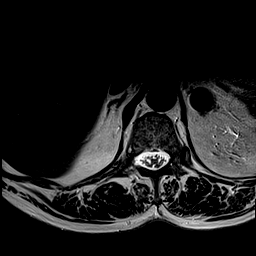
[im 49/49]
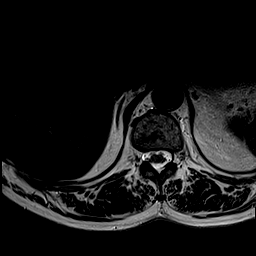

[48 of 48 positions shown; findings below may reference images not displayed]

FINDINGS: Segmentation:  Standard.

Alignment:  Physiologic.

Vertebrae:  No fracture, evidence of discitis, or bone lesion.

Conus medullaris: Extends to the T12-L1 level and appears normal.

Paraspinal and other soft tissues: 2.1 cm nodule in the left adrenal
gland, most likely a benign adenoma. Otherwise negative.

Disc levels:

L1-2: Minimal disc bulge to the right of midline with no neural
impingement.

L2-3: Minimal disc bulge to the right of midline with no neural
impingement.

L3-4:  Small broad-based disc bulge without neural impingement.

L4-5: Small broad-based disc bulge slightly asymmetric to the left
with slight narrowing of the left lateral recess which might affect
the left L5 nerve. Slight thickening of the ligamentum flavum.

L5-S1:  Tiny central disc bulge with no neural impingement.
IMPRESSION: Minimal degenerative disc disease in the lumbar spine. Slight
lateral recess narrowing at L4-5 which could affect the left L5
nerve.

## 2018-05-05 NOTE — Telephone Encounter (Signed)
Patient called and left a message requesting medication for what she thinks is a yeast infection.  I spoke with patient and she states she has a yellowish discharge and  Dysuria. I offered patient an appointment for 05/06/2018, however patient would like to wait until PCP is in office on. Patient states she will call back to make the appointment

## 2018-05-13 ENCOUNTER — Telehealth (INDEPENDENT_AMBULATORY_CARE_PROVIDER_SITE_OTHER): Payer: Self-pay | Admitting: Orthopaedic Surgery

## 2018-05-13 NOTE — Telephone Encounter (Signed)
Patient called wanting to know if something can be called in for pain. Patient stated in so much pain can't hardly walk.  Please call patient to advise.

## 2018-05-13 NOTE — Telephone Encounter (Signed)
Please advise 

## 2018-05-13 NOTE — Telephone Encounter (Signed)
Tried to call , could not get through. Will try again tomorrow.

## 2018-05-14 ENCOUNTER — Other Ambulatory Visit: Payer: Self-pay | Admitting: Physician Assistant

## 2018-05-14 NOTE — Telephone Encounter (Signed)
I called discussed. Went to jury duty then had finger and hand stiffness, increased ankle pain, feeling better today. Has meloxicam and is taking gout meds. Denies increased back pain. Has appt Nov 5th

## 2018-05-15 ENCOUNTER — Encounter: Payer: Self-pay | Admitting: Family Medicine

## 2018-05-15 ENCOUNTER — Ambulatory Visit: Payer: BLUE CROSS/BLUE SHIELD | Admitting: Family Medicine

## 2018-05-15 VITALS — BP 110/68 | HR 134 | Temp 98.0°F | Resp 18 | Wt 169.0 lb

## 2018-05-15 DIAGNOSIS — R8290 Unspecified abnormal findings in urine: Secondary | ICD-10-CM

## 2018-05-15 DIAGNOSIS — N761 Subacute and chronic vaginitis: Secondary | ICD-10-CM

## 2018-05-15 DIAGNOSIS — F321 Major depressive disorder, single episode, moderate: Secondary | ICD-10-CM

## 2018-05-15 LAB — URINALYSIS, ROUTINE W REFLEX MICROSCOPIC
Bilirubin Urine: NEGATIVE
Glucose, UA: NEGATIVE
Hgb urine dipstick: NEGATIVE
Ketones, ur: NEGATIVE
Leukocytes, UA: NEGATIVE
Nitrite: NEGATIVE
Protein, ur: NEGATIVE
Specific Gravity, Urine: 1.002 (ref 1.001–1.03)
pH: 6 (ref 5.0–8.0)

## 2018-05-15 LAB — WET PREP FOR TRICH, YEAST, CLUE

## 2018-05-15 MED ORDER — METRONIDAZOLE 500 MG PO TABS: 500.0000 mg | ORAL_TABLET | Freq: Three times a day (TID) | ORAL | 0 refills | Status: DC

## 2018-05-15 NOTE — Progress Notes (Signed)
Subjective:    Patient ID: Regina Richardson, female    DOB: Jan 16, 1961, 57 y.o.   MRN: [id number]  HPI Patient presents today concerned that she still has an infection.  She states that whenever she wipes, there is a foul smell on the toilet tissue.  There is also a yellowish to greenish discharge that is present.  It has a strong ammonia odor.  There is also discomfort in her perineum.  However she denies dysuria.  Instead she is concerned because her urine has a foul odor to it.  She denies any hematuria.  She denies any dysuria.  Was recently seen by my partner.  Wet prep was significant for yeast and she was treated with Diflucan however this did not help her symptoms.  Gonorrhea and Chlamydia testing were negative.  Patient is very emotional during our encounter today.  Despite this being the first time I have met the patient, she mentions that her marriage is terrible.  She states that her husband is a narcissist.  He does not care about her according to the patient.  He does not want to pay for any medical tests according to the patient.  He is not willing to get any counseling.  She is miserable since moving here from [place].  They have only been married for 3 years.    Past Medical History:  Diagnosis Date  . Alcoholism ([place])   . Anxiety   . Arrhythmia    heart  . Arthritis   . Cervical cancer ([place])   . Depression   . Diabetes ([place])    no medications taken, pt is unsure of dx  . GERD (gastroesophageal reflux disease)   . Hypertension   . Osteoporosis   . Sleep apnea    no CPAP   Current Outpatient Medications on File Prior to Visit  Medication Sig Dispense Refill  . allopurinol (ZYLOPRIM) 300 MG tablet TAKE 1 TABLET BY MOUTH EVERY DAY 30 tablet 4  . buPROPion (WELLBUTRIN SR) 150 MG 12 hr tablet TAKE 1 TABLET BY MOUTH TWICE A DAY 60 tablet 1  . clonazePAM (KLONOPIN) 1 MG tablet TAKE 1 TABLET BY MOUTH 2 TIMES DAILY AS NEEDED FOR ANXIETY. 60 tablet 2  . cyclobenzaprine  (FLEXERIL) 10 MG tablet TAKE 1TAB 3 TIMES A DAY AS NEEDED FOR MUSCLE SPASMS. IF CAUSING DROWSINESS TAKE 1TAB AT BEDTIME ONLY 30 tablet 5  . meloxicam (MOBIC) 7.5 MG tablet TAKE 1 TABLET BY MOUTH EVERY DAY WITH BREAKFAST 60 tablet 0  . metoprolol succinate (TOPROL-XL) 50 MG 24 hr tablet TAKE 1 TABLET BY MOUTH EVERY DAY WITH OR IMMEDIATELY FOLLOWING A MEAL 90 tablet 3  . omeprazole (PRILOSEC) 20 MG capsule TAKE 1 CAPSULE BY MOUTH EVERY DAY 30 capsule 5  . traMADol (ULTRAM) 50 MG tablet TAKE 1 TABLET BY MOUTH EVERY 6 TO 8 HOURS AS NEEDED FOR PAIN 50 tablet 2  . venlafaxine XR (EFFEXOR-XR) 150 MG 24 hr capsule TAKE ONE CAPSULE BY MOUTH IN THE MORNING W/BREAKFAST 30 capsule 0   Current Facility-Administered Medications on File Prior to Visit  Medication Dose Route Frequency Provider Last Rate Last Dose  . 0.9 %  sodium chloride infusion  500 mL Intravenous Once Ladene Artist, MD      . methylPREDNISolone acetate (DEPO-MEDROL) injection 80 mg  80 mg Other Once Magnus Sinning, MD       No Known Allergies Social History   Socioeconomic History  . Marital status: Married  Spouse name: Not on file  . Number of children: 1  . Years of education: Not on file  . Highest education level: Not on file  Occupational History  . Occupation: retired  Scientific laboratory technician  . Financial resource strain: Not on file  . Food insecurity:    Worry: Not on file    Inability: Not on file  . Transportation needs:    Medical: Not on file    Non-medical: Not on file  Tobacco Use  . Smoking status: Current Every Day Smoker    Packs/day: 0.25    Years: 40.00    Pack years: 10.00    Types: Cigarettes  . Smokeless tobacco: Never Used  Substance and Sexual Activity  . Alcohol use: Yes    Alcohol/week: 6.0 standard drinks    Types: 6 Cans of beer per week  . Drug use: No  . Sexual activity: Not Currently  Lifestyle  . Physical activity:    Days per week: Not on file    Minutes per session: Not on file  .  Stress: Not on file  Relationships  . Social connections:    Talks on phone: Not on file    Gets together: Not on file    Attends religious service: Not on file    Active member of club or organization: Not on file    Attends meetings of clubs or organizations: Not on file    Relationship status: Not on file  . Intimate partner violence:    Fear of current or ex partner: Not on file    Emotionally abused: Not on file    Physically abused: Not on file    Forced sexual activity: Not on file  Other Topics Concern  . Not on file  Social History Narrative  . Not on file   Past Surgical History:  Procedure Laterality Date  . ABDOMINAL HYSTERECTOMY     still has ovaries  . COLONOSCOPY    . left collar bone surgery     had fx, pin placed and then complitcations caused them to remove most of the bone  . left forearm surgery     from left elbow down      Review of Systems  All other systems reviewed and are negative.      Objective:   Physical Exam  Cardiovascular: Regular rhythm. Tachycardia present.  Pulmonary/Chest: Effort normal and breath sounds normal.  Genitourinary: There is tenderness in the vagina. No erythema or bleeding in the vagina. Vaginal discharge found.  Psychiatric: Her speech is normal. Her affect is labile. She is slowed. Cognition and memory are normal.  Vitals reviewed. s/p TAH  Pelvic exam was performed with a chaperone present       Assessment & Plan:  Chronic vaginitis - Plan: WET PREP FOR TRICH, YEAST, CLUE  Abnormal urine odor - Plan: Urinalysis, Routine w reflex microscopic  To be honest, I was uncomfortable during our encounter today.  She jokingly mentioned that the only way I can help her marriage is by killing her husband.  While I do not think that she was serious, the tone of her conversation and her affect is concerning.  she refused to discuss seeing a counselor for couples therapy.  She states that there is no way he would go.  I  believe that she would benefit from seeing a psychologist/psychiatrist.  When I reentered the room, her wet prep was positive for clue cells.  UA was negative for any infection.  Therefore I recommended treating bacterial vaginosis with Flagyl 500 mg p.o. twice daily for 7 days.  I warned the patient that mixing alcohol with this prescription would make her very ill and that she should avoid this.  Patient then requested that I smell her pills.  She states that they smell rotten.  At this point I interrupted the patient, I believe there are underlying psychological issues at play here.  I believe this may be affecting her on multiple levels.  Therefore I strongly suggested that she meet with a psychiatrist to discuss her pain and other issues.  I question a personality disorder based on the tone of her conversation.  I will schedule her to meet with a psychiatrist.

## 2018-05-27 ENCOUNTER — Ambulatory Visit (INDEPENDENT_AMBULATORY_CARE_PROVIDER_SITE_OTHER): Payer: BLUE CROSS/BLUE SHIELD | Admitting: Orthopaedic Surgery

## 2018-05-27 ENCOUNTER — Encounter (INDEPENDENT_AMBULATORY_CARE_PROVIDER_SITE_OTHER): Payer: Self-pay | Admitting: Orthopaedic Surgery

## 2018-05-27 ENCOUNTER — Other Ambulatory Visit: Payer: Self-pay | Admitting: Physician Assistant

## 2018-05-27 VITALS — BP 128/72 | HR 71 | Ht 60.0 in | Wt 160.0 lb

## 2018-05-27 DIAGNOSIS — M65352 Trigger finger, left little finger: Secondary | ICD-10-CM | POA: Diagnosis not present

## 2018-05-27 DIAGNOSIS — M65342 Trigger finger, left ring finger: Secondary | ICD-10-CM

## 2018-05-27 MED ORDER — METHYLPREDNISOLONE ACETATE 40 MG/ML IJ SUSP
13.3300 mg | INTRAMUSCULAR | Status: AC | PRN
  Administered 2018-05-27: 13.33 mg

## 2018-05-27 MED ORDER — LIDOCAINE HCL 1 % IJ SOLN
0.3000 mL | INTRAMUSCULAR | Status: AC | PRN
  Administered 2018-05-27: .3 mL

## 2018-05-27 MED ORDER — BUPIVACAINE HCL 0.25 % IJ SOLN
0.3300 mL | INTRAMUSCULAR | Status: AC | PRN
  Administered 2018-05-27: .33 mL

## 2018-05-27 NOTE — Telephone Encounter (Signed)
Last OV 10/2/42019 Last refill 01/15/2018 Ok to refill?

## 2018-05-27 NOTE — Progress Notes (Signed)
Office Visit Note   Patient: Regina Richardson           Date of Birth: 1961/06/12           MRN: [id number] Visit Date: 05/27/2018              Requested by: Orlena Sheldon, PA-C [id number],  [id number] PCP: Alycia Rossetti, MD   Assessment & Plan: Visit Diagnoses:  1. Trigger finger, left ring finger   2. Trigger little finger of left hand     Plan: Trigger finger injection performed ring and small finger .  Patient is crying and tearful.  I again discussed with her she needs sleep fashion for treatment of her anxiety and depression problems.  She has extreme anxiety driving on the road she already takes Klonopin occasionally.  She states she will seek appropriate treatment.  Follow-Up Instructions: No follow-ups on file.   Orders:  No orders of the defined types were placed in this encounter.  No orders of the defined types were placed in this encounter.     Procedures: Hand/UE Inj: L ring A1 for trigger finger on 05/27/2018 10:40 AM Medications: 0.3 mL lidocaine 1 %; 0.33 mL bupivacaine 0.25 %; 13.33 mg methylPREDNISolone acetate 40 MG/ML  Hand/UE Inj for trigger finger on 05/27/2018 10:41 AM Medications: 0.3 mL lidocaine 1 %; 0.33 mL bupivacaine 0.25 %; 13.33 mg methylPREDNISolone acetate 40 MG/ML      Clinical Data: No additional findings.   Subjective: Chief Complaint  Patient presents with  . Right Hand - Pain  . Left Hand - Pain    HPI 57 year old female returns she is had trouble with ring and small finger triggering with fingers getting stuck in flexed position.  Her fingers lock at night.  Patient states she continues to have some problems with her right arm pain with lifting.  When she cleans her house her hands and become increasingly symptomatic.  Review of Systems 14 systems updated unchanged from last office visit.  She is working on getting appointment for evaluation concerning anxiety and depression  problems.   Objective: Vital Signs: BP 128/72   Pulse 71   Ht 5' (1.524 m)   Wt 160 lb (72.6 kg)   BMI 31.25 kg/m   Physical Exam  Constitutional: She is oriented to person, place, and time. She appears well-developed.  HENT:  Head: Normocephalic.  Right Ear: External ear normal.  Left Ear: External ear normal.  Eyes: Pupils are equal, round, and reactive to light.  Neck: No tracheal deviation present. No thyromegaly present.  Cardiovascular: Normal rate.  Pulmonary/Chest: Effort normal.  Abdominal: Soft.  Neurological: She is alert and oriented to person, place, and time.  Skin: Skin is warm and dry.  Psychiatric: She has a normal mood and affect. Her behavior is normal.    Ortho Exam patient is amatory she has extreme tenderness over the A1 pulley ring and small finger.  Previous traumatic amputation tip of the index finger.  She is slow getting from sitting standing is better changing positions with back stiffness.  Patient is able to demonstrate triggering and points to the A1 pulley area where she has pain.  No tenderness of the A2 A4 pulleys in either the ring or small finger. Specialty Comments:  No specialty comments available.  Imaging: No results found.   PMFS History: Patient Active Problem List   Diagnosis Date Noted  . Other intervertebral disc degeneration, lumbar  region 05/08/2017  . Midline low back pain 11/22/2016  . Scoliosis 11/22/2016  . Smoker 09/07/2015  . Essential hypertension 05/25/2015  . Insomnia 05/25/2015  . Anxiety and depression   . Depression    Past Medical History:  Diagnosis Date  . Alcoholism ([place])   . Anxiety   . Arrhythmia    heart  . Arthritis   . Cervical cancer ([place])   . Depression   . Diabetes ([place])    no medications taken, pt is unsure of dx  . GERD (gastroesophageal reflux disease)   . Hypertension   . Osteoporosis   . Sleep apnea    no CPAP    Family History  Problem Relation Age of Onset  . Alcohol abuse  Mother   . Liver disease Mother   . Heart disease Father   . Alcohol abuse Father   . Diabetes Sister   . Liver disease Sister   . Diabetes Maternal Aunt   . Kidney disease Maternal Aunt   . Diabetes Cousin        mat cousin  . Breast cancer Neg Hx   . Colon cancer Neg Hx   . Esophageal cancer Neg Hx   . Stomach cancer Neg Hx   . Rectal cancer Neg Hx     Past Surgical History:  Procedure Laterality Date  . ABDOMINAL HYSTERECTOMY     still has ovaries  . COLONOSCOPY    . left collar bone surgery     had fx, pin placed and then complitcations caused them to remove most of the bone  . left forearm surgery     from left elbow down   Social History   Occupational History  . Occupation: retired  Tobacco Use  . Smoking status: Current Every Day Smoker    Packs/day: 0.25    Years: 40.00    Pack years: 10.00    Types: Cigarettes  . Smokeless tobacco: Never Used  Substance and Sexual Activity  . Alcohol use: Yes    Alcohol/week: 6.0 standard drinks    Types: 6 Cans of beer per week  . Drug use: No  . Sexual activity: Not Currently

## 2018-05-28 ENCOUNTER — Telehealth: Payer: Self-pay

## 2018-05-28 NOTE — Telephone Encounter (Signed)
Patient was in office on 10/24 for yeast infection and was prescribed Metronidazole(500)mg patient states she has completed the medication and is still having itching and the same discharge. Patient states she would like to have something called in without her having to come back in if possible.

## 2018-05-29 ENCOUNTER — Other Ambulatory Visit: Payer: Self-pay

## 2018-05-29 ENCOUNTER — Telehealth (INDEPENDENT_AMBULATORY_CARE_PROVIDER_SITE_OTHER): Payer: Self-pay | Admitting: Orthopaedic Surgery

## 2018-05-29 DIAGNOSIS — N761 Subacute and chronic vaginitis: Secondary | ICD-10-CM

## 2018-05-29 MED ORDER — FLUCONAZOLE 150 MG PO TABS: 150.0000 mg | ORAL_TABLET | Freq: Once | ORAL | 0 refills | Status: AC

## 2018-05-29 NOTE — Telephone Encounter (Signed)
She has been treated with Diflucan and now metronidazole without wet prep showing any other abnormalities.  I question if this is truly a yeast infection.  At this point, I believe she would benefit from seeing gynecology.  She can try Diflucan 150 mg p.o. x1 one more time but I want her to see a gynecologist

## 2018-05-29 NOTE — Telephone Encounter (Signed)
Call placed to patient she is aware of provider recommendations and the referral has been placed.

## 2018-05-29 NOTE — Telephone Encounter (Signed)
Received a call from [place] at [facility] checking status of request for records. I advised records were faxed 10/31, 35 pages. She will double check her faxes and call me back if I need to re send.

## 2018-06-03 ENCOUNTER — Other Ambulatory Visit: Payer: Self-pay

## 2018-06-03 ENCOUNTER — Ambulatory Visit: Payer: BLUE CROSS/BLUE SHIELD | Admitting: Obstetrics and Gynecology

## 2018-06-03 ENCOUNTER — Encounter: Payer: Self-pay | Admitting: Obstetrics and Gynecology

## 2018-06-03 VITALS — BP 134/78 | HR 80 | Ht 61.81 in | Wt 169.0 lb

## 2018-06-03 DIAGNOSIS — N3941 Urge incontinence: Secondary | ICD-10-CM

## 2018-06-03 DIAGNOSIS — N898 Other specified noninflammatory disorders of vagina: Secondary | ICD-10-CM | POA: Diagnosis not present

## 2018-06-03 NOTE — Progress Notes (Signed)
57 y.o. G87P1000 Married White or Caucasian Not Hispanic or Latino female here for a consult from Dr Dennard Schaumann for recurrent vaginitis.   She c/o vaginal discharge for the last month. She feels like something is coming out, underwear isn't wet, she doesn't see any discoloration on her underwear. No itching, burning, irritation or vaginal odor.  She was treated for yeast in 9/19 and then BV a couple of weeks ago. She took flagyl for 7 days. No help with her d/c. No vaginal odor, but notices an odor to her urine. The urinary odor started a month ago. Negative urine dip.  She is having some urge incontinence, can be a large amount of urine. It has been going on for years. This occurs more when she is out of the house. Currently not working. If she can get to the bathroom she can prevent the leakage. Occasional GSI, tolerable.   Not sexually active.  She mentioned that her last GYN was doing ?Colposcopies on her after her hysterectomy and she thinks she was supposed to have another one. Not clear from her description.  She just had a pap with her primary in 9/19, it was negative with negative HPV.  No LMP recorded. Patient has had a hysterectomy.          Sexually active: No.  The current method of family planning is none.    Exercising: No.  The patient does not participate in regular exercise at present. Smoker:  yes  Health Maintenance: Pap:  04/10/2018 WNL History of abnormal Pap:  Yes, has had a colposcopy and a LEEP MMG:  04/09/2018 Birads 1 negative BMD:   Osteoporosis per patient, [place] Colonoscopy: 09/04/2017 polyps TDaP:  Unsure Gardasil: N/A   reports that she has been smoking cigarettes. She has a 10.00 pack-year smoking history. She has never used smokeless tobacco. She reports that she drinks about 4.0 - 5.0 standard drinks of alcohol per week. She reports that she does not use drugs.  Past Medical History:  Diagnosis Date  . Alcoholism ([place])   .  Anxiety   . Arrhythmia    heart  . Arthritis   . Cancer ([place])   . Depression   . Diabetes ([place])    no medications taken, pt is unsure of dx  . GERD (gastroesophageal reflux disease)   . Hypertension   . Osteoporosis   . Sleep apnea    no CPAP  . Urinary incontinence     Past Surgical History:  Procedure Laterality Date  . ABDOMINAL HYSTERECTOMY     still has ovaries  . CERVICAL BIOPSY  W/ LOOP ELECTRODE EXCISION    . CESAREAN SECTION    . COLONOSCOPY    . COLPOSCOPY    . left collar bone surgery     had fx, pin placed and then complitcations caused them to remove most of the bone  . left forearm surgery     from left elbow down    Current Outpatient Medications  Medication Sig Dispense Refill  . allopurinol (ZYLOPRIM) 300 MG tablet TAKE 1 TABLET BY MOUTH EVERY DAY 30 tablet 4  . buPROPion (WELLBUTRIN SR) 150 MG 12 hr tablet TAKE 1 TABLET BY MOUTH TWICE A DAY 60 tablet 1  . clonazePAM (KLONOPIN) 1 MG tablet TAKE 1 TABLET BY MOUTH 2 TIMES DAILY AS NEEDED FOR ANXIETY. 60 tablet 2  . cyclobenzaprine (FLEXERIL) 10 MG tablet TAKE 1TAB 3 TIMES A DAY AS NEEDED  FOR MUSCLE SPASMS. IF CAUSING DROWSINESS TAKE 1TAB AT BEDTIME ONLY 30 tablet 5  . meloxicam (MOBIC) 7.5 MG tablet TAKE 1 TABLET BY MOUTH EVERY DAY WITH BREAKFAST 60 tablet 0  . metoprolol succinate (TOPROL-XL) 50 MG 24 hr tablet TAKE 1 TABLET BY MOUTH EVERY DAY WITH OR IMMEDIATELY FOLLOWING A MEAL 90 tablet 3  . omeprazole (PRILOSEC) 20 MG capsule TAKE 1 CAPSULE BY MOUTH EVERY DAY 30 capsule 5  . traMADol (ULTRAM) 50 MG tablet TAKE 1 TABLET BY MOUTH EVERY 6 TO 8 HOURS AS NEEDED FOR PAIN 50 tablet 2  . venlafaxine XR (EFFEXOR-XR) 150 MG 24 hr capsule TAKE ONE CAPSULE BY MOUTH IN THE MORNING W/BREAKFAST 30 capsule 0   Current Facility-Administered Medications  Medication Dose Route Frequency Provider Last Rate Last Dose  . 0.9 %  sodium chloride infusion  500 mL Intravenous Once Ladene Artist, MD      . methylPREDNISolone  acetate (DEPO-MEDROL) injection 80 mg  80 mg Other Once Magnus Sinning, MD        Family History  Problem Relation Age of Onset  . Alcohol abuse Mother   . Liver disease Mother   . Heart disease Father   . Alcohol abuse Father   . Diabetes Sister   . Liver disease Sister   . Diabetes Maternal Aunt   . Kidney disease Maternal Aunt   . Diabetes Cousin        mat cousin  . Breast cancer Neg Hx   . Colon cancer Neg Hx   . Esophageal cancer Neg Hx   . Stomach cancer Neg Hx   . Rectal cancer Neg Hx     Review of Systems  Constitutional: Negative.   HENT: Negative.   Eyes: Negative.   Respiratory: Negative.   Cardiovascular: Negative.   Gastrointestinal: Negative.   Endocrine: Negative.   Genitourinary: Positive for vaginal discharge.  Musculoskeletal: Negative.   Skin: Negative.   Allergic/Immunologic: Negative.   Neurological: Negative.   Hematological: Negative.   Psychiatric/Behavioral: Negative.     Exam:   BP 134/78 (BP Location: Left Arm, Patient Position: Sitting, Cuff Size: Normal)   Pulse 80   Ht 5' 1.81" (1.57 m)   Wt 169 lb (76.7 kg)   BMI 31.10 kg/m   Weight change: @WEIGHTCHANGE @ Height:   Height: 5' 1.81" (157 cm)  Ht Readings from Last 3 Encounters:  06/03/18 5' 1.81" (1.57 m)  05/27/18 5' (1.524 m)  04/10/18 5' (1.524 m)    General appearance: alert, cooperative and appears stated age Head: Normocephalic, without obvious abnormality, atraumatic Abdomen: soft, non-tender; mildly distended,  no masses,  no organomegaly CVA: not tender Neurologic: Grossly normal   Pelvic: External genitalia:  no lesions              Urethra:  normal appearing urethra with no masses, tenderness or lesions              Bartholins and Skenes: normal                 Vagina: normal appearing mildly atrophic vagina with a small amount of white, watery/creamy vaginal discharge              Cervix: absent               Bimanual Exam:  Uterus:  uterus absent               Adnexa: no mass, fullness, tenderness  Chaperone was present for exam.  A:  Vaginal discharge, no irritation or odor, no significant findings on exam  Urge incontinence, mostly tolerable  Rare GSI  Urine odor  P:   Affirm  Send urine for ua, c&s  Get a copy of her GYN notes from [place]. She thinks she was supposed to have another colposcopy  CC: Dr Jenna Luo Note sent

## 2018-06-04 LAB — VAGINITIS/VAGINOSIS, DNA PROBE
Candida Species: NEGATIVE
Gardnerella vaginalis: POSITIVE — AB
Trichomonas vaginosis: NEGATIVE

## 2018-06-04 LAB — URINALYSIS, MICROSCOPIC ONLY
Casts: NONE SEEN /lpf
RBC, UA: NONE SEEN /hpf (ref 0–2)

## 2018-06-05 ENCOUNTER — Other Ambulatory Visit: Payer: Self-pay | Admitting: *Deleted

## 2018-06-05 LAB — URINE CULTURE: Organism ID, Bacteria: NO GROWTH

## 2018-06-05 MED ORDER — METRONIDAZOLE 500 MG PO TABS: 500.0000 mg | ORAL_TABLET | Freq: Two times a day (BID) | ORAL | 0 refills | Status: DC

## 2018-06-11 DIAGNOSIS — F332 Major depressive disorder, recurrent severe without psychotic features: Secondary | ICD-10-CM | POA: Diagnosis not present

## 2018-06-18 ENCOUNTER — Encounter: Payer: Self-pay | Admitting: Family Medicine

## 2018-07-02 ENCOUNTER — Telehealth (INDEPENDENT_AMBULATORY_CARE_PROVIDER_SITE_OTHER): Payer: Self-pay | Admitting: Orthopaedic Surgery

## 2018-07-02 NOTE — Telephone Encounter (Signed)
Received call from [facility] @ [facility] checking if we received request for updated records, I told her last request was 10/31. She stated she would re fax to [place]

## 2018-07-07 DIAGNOSIS — F332 Major depressive disorder, recurrent severe without psychotic features: Secondary | ICD-10-CM | POA: Diagnosis not present

## 2018-07-17 ENCOUNTER — Telehealth: Payer: Self-pay | Admitting: Obstetrics and Gynecology

## 2018-07-17 NOTE — Telephone Encounter (Signed)
Please let the patient know that I reviewed her records from [facility] in [place].  The records note that she had Vulvar dysplasia in 2008 and LSIL in 2009, pap in 2010 was normal. Last pap from that practive was in 9/12 and was negative with negative HPV testing.  Unless she had abnormal paps between then and the recent pap in 9/19, I don't think she needs further evaluation at this time.

## 2018-07-21 NOTE — Telephone Encounter (Signed)
Left message to call Anders Hohmann at [phone]. 

## 2018-07-24 ENCOUNTER — Other Ambulatory Visit: Payer: Self-pay | Admitting: *Deleted

## 2018-07-24 MED ORDER — OMEPRAZOLE 20 MG PO CPDR: DELAYED_RELEASE_CAPSULE | ORAL | 3 refills | Status: DC

## 2018-07-29 NOTE — Progress Notes (Signed)
GYNECOLOGY  VISIT   HPI: 58 y.o.   Married White or Caucasian Not Hispanic or Latino  female   G1P1001 with No LMP recorded. Patient has had a hysterectomy.   here for vaginal discharge with odor.  The patient has had recurrent vaginitis symptoms. She had documented BV in 10/19 and in 11/19. She was treated with the oral flagyl in 11/19, symptoms resolved for a week. She c/o a mild watery vaginal d/c, some white flecks, no odor.  She c/o urinary frequency and dysuria for the last few months. Also has pain in her SP region when she voids, then it burns.  Last urine culture was in 11/19 and was negative.  No fevers. She has chronic back pain and arthritis in many locations.  Not sexually active for over 10 years. She drinks lots of water and doesn't drink caffeine.   GYNECOLOGIC HISTORY: No LMP recorded. Patient has had a hysterectomy. Contraception: Hysterectomy Menopausal hormone therapy: None        OB History    Gravida  1   Para  1   Term  1   Preterm      AB      Living  1     SAB      TAB      Ectopic      Multiple      Live Births  1              Patient Active Problem List   Diagnosis Date Noted  . Other intervertebral disc degeneration, lumbar region 05/08/2017  . Midline low back pain 11/22/2016  . Scoliosis 11/22/2016  . Smoker 09/07/2015  . Essential hypertension 05/25/2015  . Insomnia 05/25/2015  . Anxiety and depression   . Depression     Past Medical History:  Diagnosis Date  . Alcoholism ([place])   . Anxiety   . Arrhythmia    heart  . Arthritis   . Cancer ([place])   . Depression   . Diabetes ([place])    no medications taken, pt is unsure of dx  . GERD (gastroesophageal reflux disease)   . Hypertension   . Osteoporosis   . Sleep apnea    no CPAP  . Urinary incontinence     Past Surgical History:  Procedure Laterality Date  . ABDOMINAL HYSTERECTOMY     still has ovaries  . CERVICAL BIOPSY  W/ LOOP ELECTRODE EXCISION    . CESAREAN  SECTION    . COLONOSCOPY    . COLPOSCOPY    . left collar bone surgery     had fx, pin placed and then complitcations caused them to remove most of the bone  . left forearm surgery     from left elbow down    Current Outpatient Medications  Medication Sig Dispense Refill  . allopurinol (ZYLOPRIM) 300 MG tablet TAKE 1 TABLET BY MOUTH EVERY DAY 30 tablet 4  . buPROPion (WELLBUTRIN SR) 150 MG 12 hr tablet TAKE 1 TABLET BY MOUTH TWICE A DAY 60 tablet 1  . clonazePAM (KLONOPIN) 1 MG tablet TAKE 1 TABLET BY MOUTH 2 TIMES DAILY AS NEEDED FOR ANXIETY. 60 tablet 2  . cyclobenzaprine (FLEXERIL) 10 MG tablet TAKE 1TAB 3 TIMES A DAY AS NEEDED FOR MUSCLE SPASMS. IF CAUSING DROWSINESS TAKE 1TAB AT BEDTIME ONLY 30 tablet 5  . meloxicam (MOBIC) 7.5 MG tablet TAKE 1 TABLET BY MOUTH EVERY DAY WITH BREAKFAST 60 tablet 0  . metoprolol succinate (TOPROL-XL) 50 MG  24 hr tablet TAKE 1 TABLET BY MOUTH EVERY DAY WITH OR IMMEDIATELY FOLLOWING A MEAL 90 tablet 3  . omeprazole (PRILOSEC) 20 MG capsule TAKE 1 CAPSULE BY MOUTH EVERY DAY 30 capsule 3  . traMADol (ULTRAM) 50 MG tablet TAKE 1 TABLET BY MOUTH EVERY 6 TO 8 HOURS AS NEEDED FOR PAIN 50 tablet 2  . metroNIDAZOLE (FLAGYL) 500 MG tablet Take 1 tablet (500 mg total) by mouth 2 (two) times daily. (Patient not taking: Reported on 07/30/2018) 14 tablet 0   Current Facility-Administered Medications  Medication Dose Route Frequency Provider Last Rate Last Dose  . 0.9 %  sodium chloride infusion  500 mL Intravenous Once Ladene Artist, MD      . methylPREDNISolone acetate (DEPO-MEDROL) injection 80 mg  80 mg Other Once Magnus Sinning, MD         ALLERGIES: Patient has no known allergies.  Family History  Problem Relation Age of Onset  . Alcohol abuse Mother   . Liver disease Mother   . Heart disease Father   . Alcohol abuse Father   . Diabetes Sister   . Liver disease Sister   . Diabetes Maternal Aunt   . Kidney disease Maternal Aunt   . Diabetes Cousin         mat cousin  . Breast cancer Neg Hx   . Colon cancer Neg Hx   . Esophageal cancer Neg Hx   . Stomach cancer Neg Hx   . Rectal cancer Neg Hx     Social History   Socioeconomic History  . Marital status: Married    Spouse name: Not on file  . Number of children: 1  . Years of education: Not on file  . Highest education level: Not on file  Occupational History  . Occupation: retired  Scientific laboratory technician  . Financial resource strain: Not on file  . Food insecurity:    Worry: Not on file    Inability: Not on file  . Transportation needs:    Medical: Not on file    Non-medical: Not on file  Tobacco Use  . Smoking status: Current Every Day Smoker    Packs/day: 0.25    Years: 40.00    Pack years: 10.00    Types: Cigarettes  . Smokeless tobacco: Never Used  Substance and Sexual Activity  . Alcohol use: Yes    Alcohol/week: 4.0 - 5.0 standard drinks    Types: 4 - 5 Standard drinks or equivalent per week  . Drug use: No  . Sexual activity: Not Currently    Birth control/protection: None  Lifestyle  . Physical activity:    Days per week: Not on file    Minutes per session: Not on file  . Stress: Not on file  Relationships  . Social connections:    Talks on phone: Not on file    Gets together: Not on file    Attends religious service: Not on file    Active member of club or organization: Not on file    Attends meetings of clubs or organizations: Not on file    Relationship status: Not on file  . Intimate partner violence:    Fear of current or ex partner: Not on file    Emotionally abused: Not on file    Physically abused: Not on file    Forced sexual activity: Not on file  Other Topics Concern  . Not on file  Social History Narrative  . Not on file  Review of Systems  Constitutional: Negative.   HENT: Negative.   Eyes: Negative.   Respiratory: Negative.   Cardiovascular: Negative.   Gastrointestinal: Negative.   Genitourinary:       Vaginal discharge with  odor Nocturia  Musculoskeletal: Negative.   Skin: Negative.   Neurological: Negative.   Endo/Heme/Allergies: Negative.   Psychiatric/Behavioral: Negative.     PHYSICAL EXAMINATION:    BP 136/82 (BP Location: Right Arm, Patient Position: Sitting, Cuff Size: Normal)   Pulse 72   Wt 169 lb (76.7 kg)   BMI 31.10 kg/m     General appearance: alert, cooperative and appears stated age Abdomen: soft, tender in the suprapubic region; non distended, no masses,  no organomegaly Back: left side of her back is tender to palpation. No distinct CVA tenderness  Pelvic: External genitalia:  no lesions              Urethra:  normal appearing urethra with no masses, tenderness or lesions              Bartholins and Skenes: normal                 Vagina: normal appearing vagina with normal color and discharge, no lesions              Cervix: absent              Bimanual Exam:  Uterus:  uterus absent              Adnexa: no mass, fullness, tenderness              Bladder: tender to palpation  Chaperone was present for exam.  ASSESSMENT Vaginal discharge, normal exam. Documented BV in 10/19 and 11/19.  Urinary frequency and dysuria for a couple of months. Negative urine dip    PLAN Affirm sent, if + for BV will place on suppression Send urine for ua, c&s, treat if + Can use Azo for the next few days If negative urine culture, will send for Urology consultation   An After Visit Summary was printed and given to the patient.

## 2018-07-29 NOTE — Telephone Encounter (Signed)
Patient is returning a call to Kaitlyn. °

## 2018-07-29 NOTE — Telephone Encounter (Signed)
Spoke with patient. Advised of message as seen below from [place]. Patient verbalizes understanding. Patient does not think she has had an abnormal pap smear between 2012 and her pap here. Patient states that she is having ongoing vaginal discharge. Advised will need to be seen for further evaluation. Appointment scheduled for tomorrow 07/30/2018 at 9 am with Dr.Jertson. Patient is agreeable to date and time.  Routing to provider and will close encounter.

## 2018-07-29 NOTE — Telephone Encounter (Signed)
Left message to call [place] at [phone]. 

## 2018-07-30 ENCOUNTER — Encounter: Payer: Self-pay | Admitting: Obstetrics and Gynecology

## 2018-07-30 ENCOUNTER — Other Ambulatory Visit: Payer: Self-pay

## 2018-07-30 ENCOUNTER — Ambulatory Visit: Payer: BLUE CROSS/BLUE SHIELD | Admitting: Obstetrics and Gynecology

## 2018-07-30 VITALS — BP 136/82 | HR 72 | Wt 169.0 lb

## 2018-07-30 DIAGNOSIS — R300 Dysuria: Secondary | ICD-10-CM | POA: Diagnosis not present

## 2018-07-30 DIAGNOSIS — R350 Frequency of micturition: Secondary | ICD-10-CM | POA: Diagnosis not present

## 2018-07-30 DIAGNOSIS — N898 Other specified noninflammatory disorders of vagina: Secondary | ICD-10-CM | POA: Diagnosis not present

## 2018-07-31 LAB — URINE CULTURE: Organism ID, Bacteria: NO GROWTH

## 2018-07-31 LAB — URINALYSIS, MICROSCOPIC ONLY: Casts: NONE SEEN /lpf

## 2018-07-31 LAB — VAGINITIS/VAGINOSIS, DNA PROBE
Candida Species: NEGATIVE
Gardnerella vaginalis: POSITIVE — AB
Trichomonas vaginosis: NEGATIVE

## 2018-08-01 ENCOUNTER — Other Ambulatory Visit: Payer: Self-pay | Admitting: Obstetrics and Gynecology

## 2018-08-01 ENCOUNTER — Telehealth: Payer: Self-pay | Admitting: Emergency Medicine

## 2018-08-01 DIAGNOSIS — R300 Dysuria: Secondary | ICD-10-CM

## 2018-08-01 DIAGNOSIS — R350 Frequency of micturition: Secondary | ICD-10-CM

## 2018-08-01 MED ORDER — METRONIDAZOLE 0.75 % VA GEL: VAGINAL | 0 refills | Status: DC

## 2018-08-01 NOTE — Telephone Encounter (Signed)
Notified patient of lab results.  Instructions given regarding use of Metrogel.  Also advised urine culture was negative.  Patient advised to call back with any further bacterial vaginosis symptoms.  Patient expresses understanding and will follow up prn.  Encounter closed.

## 2018-08-01 NOTE — Telephone Encounter (Signed)
-----   Message from Salvadore Dom, MD sent at 07/31/2018  5:06 PM EST ----- Please inform the patient that her vaginitis probe was + for BV and treat with flagyl (either oral or vaginal, her choice), no ETOH while on Flagyl.  Oral: Flagyl 500 mg BID x 7 days, or Vaginal: Metrogel, 1 applicator per vagina q day x 5 days. If she has another infection, she will need suppression. Urinalysis was negative, the culture is pending

## 2018-08-01 NOTE — Telephone Encounter (Signed)
-----   Message from Salvadore Dom, MD sent at 08/01/2018 10:26 AM EST ----- Please advise the patient of normal results. She has long term issues with urinary urgency and dysuria, please let her know that a urology referral has been placed.

## 2018-08-01 NOTE — Telephone Encounter (Signed)
Previously spoke with patient earlier today and discussed negative urine culture.  No answer on phone.  Left detailed message, okay per designated party release form.   Telephone Information:  Mobile [phone]  Left detailed message on mobile and advised referral was pending for [facility].  Call back with any questions.  Encounter closed.

## 2018-08-07 ENCOUNTER — Other Ambulatory Visit (INDEPENDENT_AMBULATORY_CARE_PROVIDER_SITE_OTHER): Payer: Self-pay | Admitting: Orthopaedic Surgery

## 2018-08-07 NOTE — Telephone Encounter (Signed)
Ok for refill? 

## 2018-08-19 ENCOUNTER — Other Ambulatory Visit: Payer: Self-pay | Admitting: Family Medicine

## 2018-08-19 NOTE — Telephone Encounter (Signed)
Ok to refill??  Last office visit 05/15/2018.  Last refill 06/08/2018.

## 2018-08-24 IMAGING — US US EXTREM LOW*L* LIMITED
1 series · 14 of 19 positions shown · non-contrast
Comparison: None.

CLINICAL DATA: Swelling in distal left lower extremity laterally
for 6 months.

EXAM:
ULTRASOUND left LOWER EXTREMITY LIMITED
TECHNIQUE: Ultrasound examination of the lower extremity soft tissues was
performed in the area of clinical concern.

[Series 1: us extrem low*left* limited · 0.06mm/px · 14 of 19 slices shown]
[im 1/19]
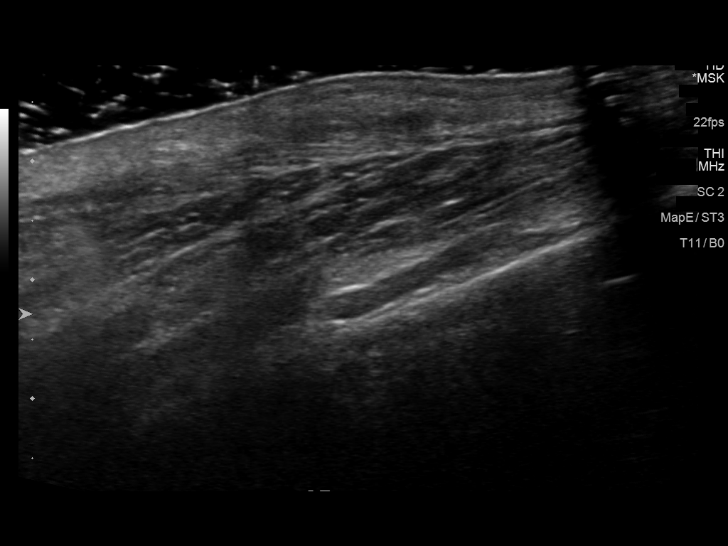
[im 3/19]
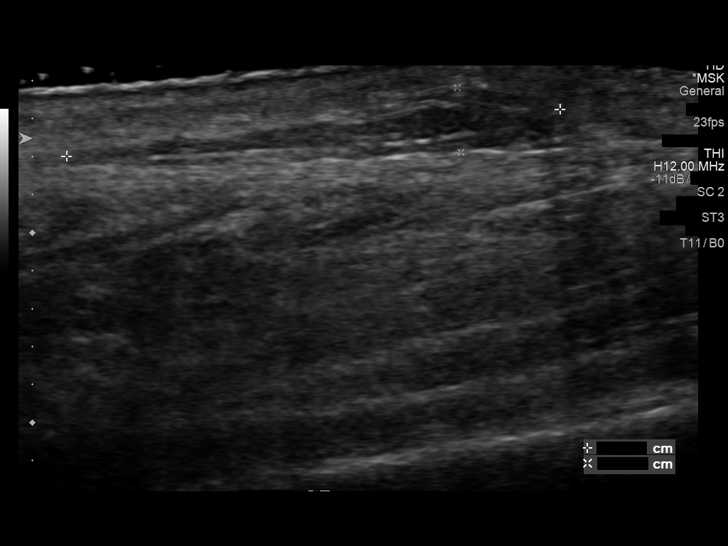
[im 4/19]
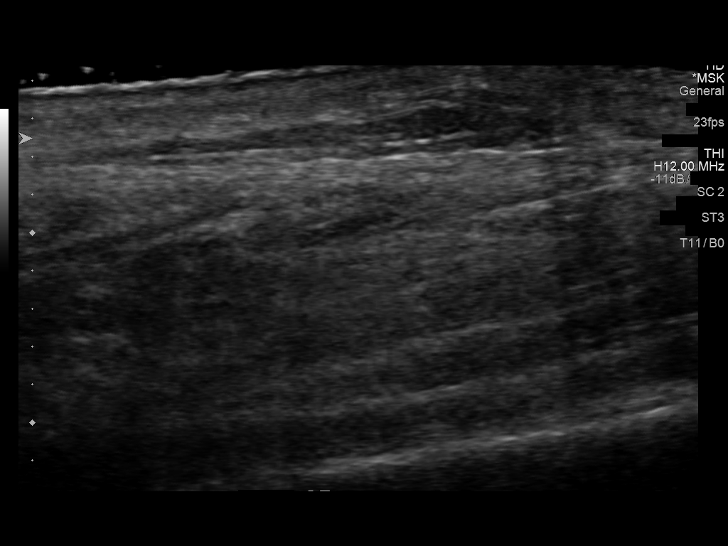
[im 5/19]
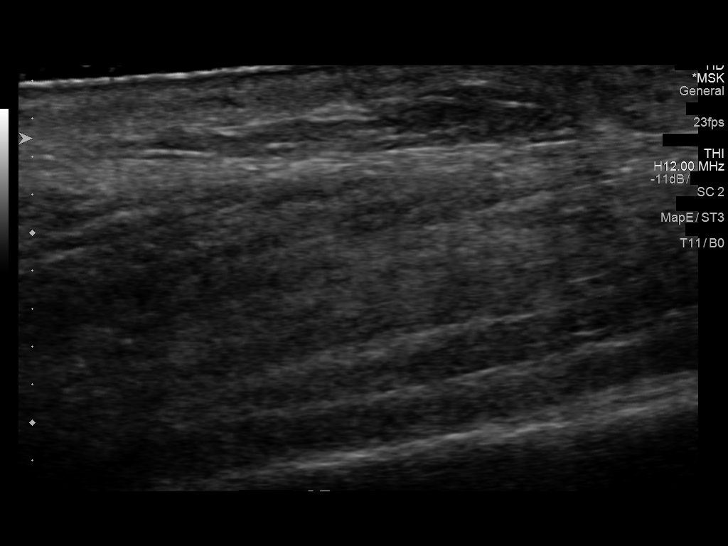
[im 7/19]
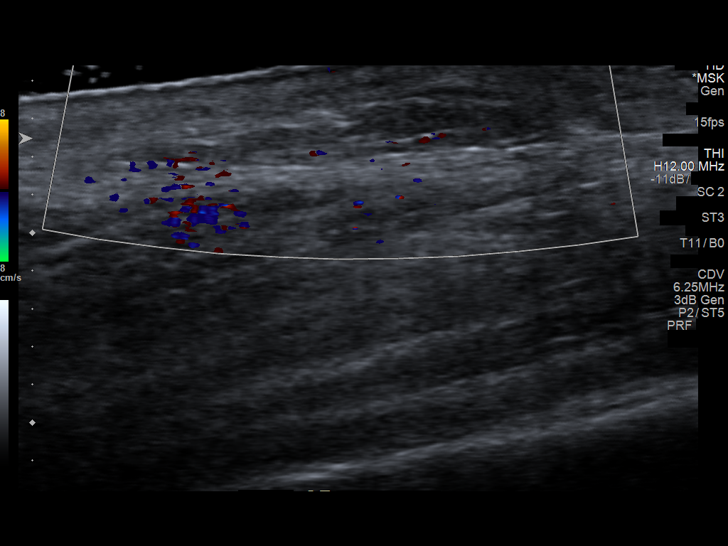
[im 8/19]
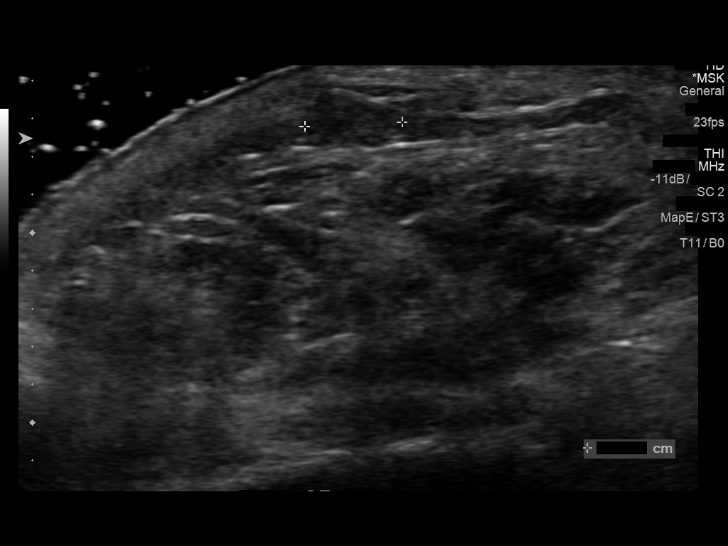
[im 9/19]
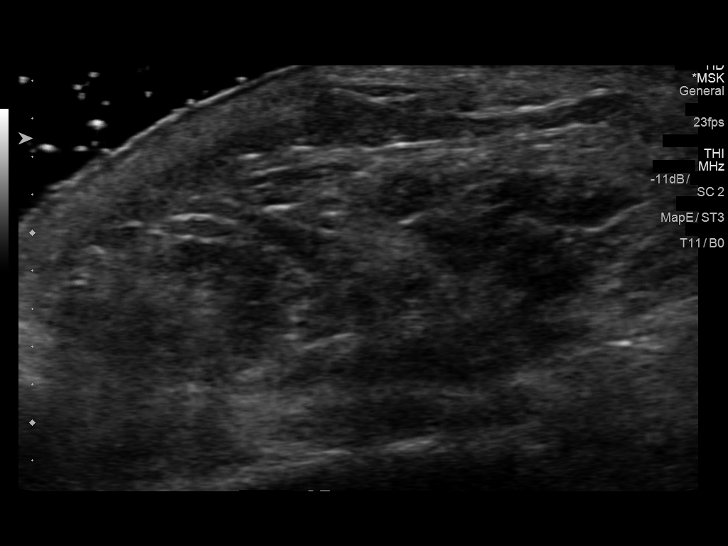
[im 11/19]
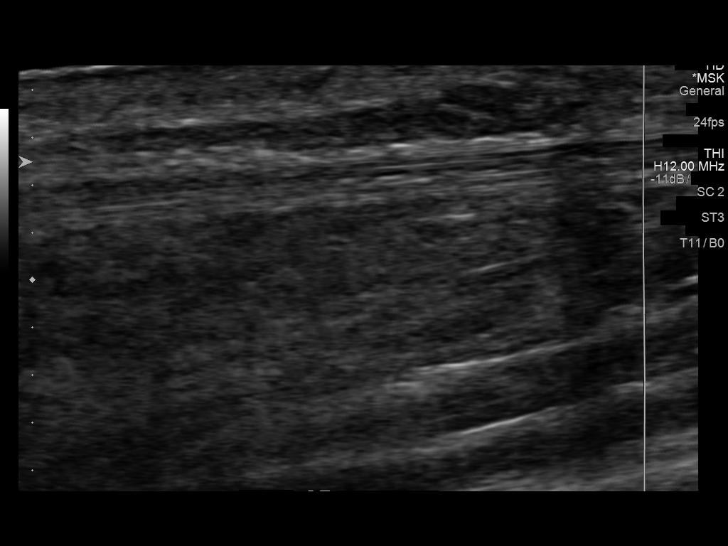
[im 12/19]
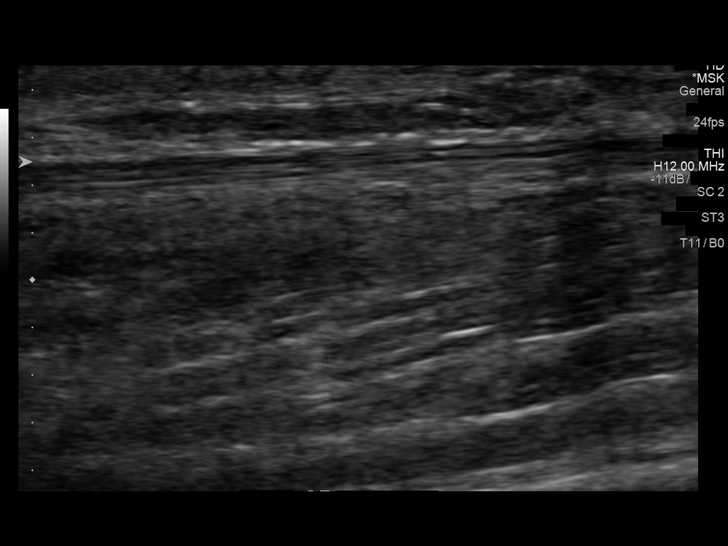
[im 13/19]
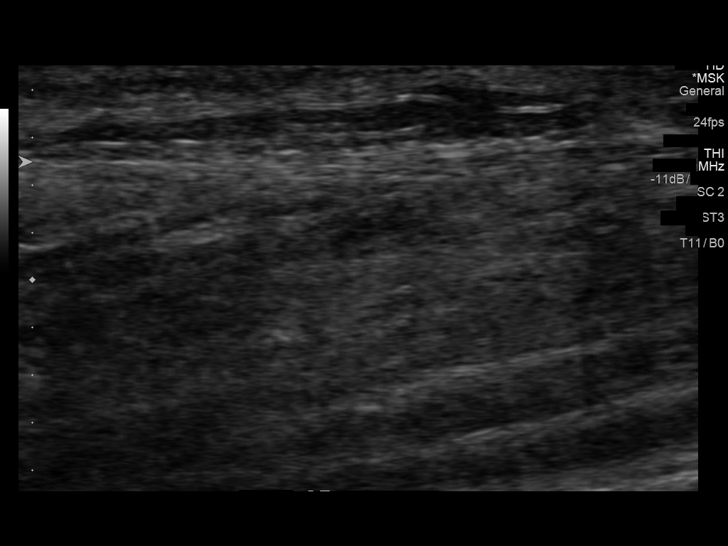
[im 15/19]
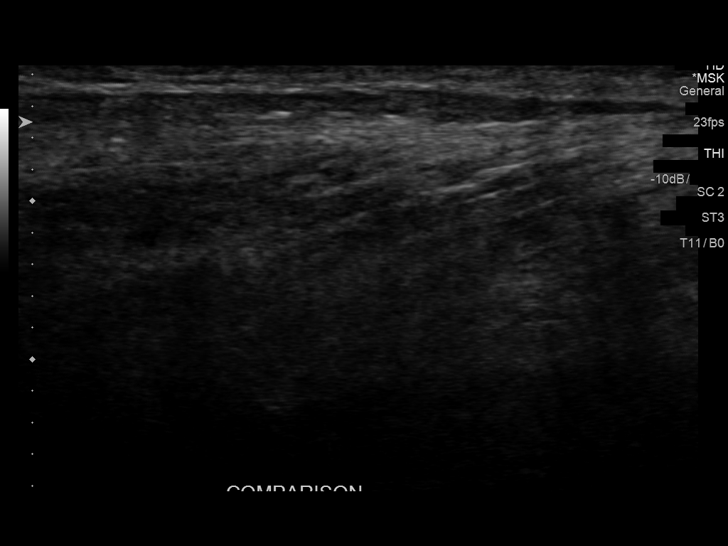
[im 16/19]
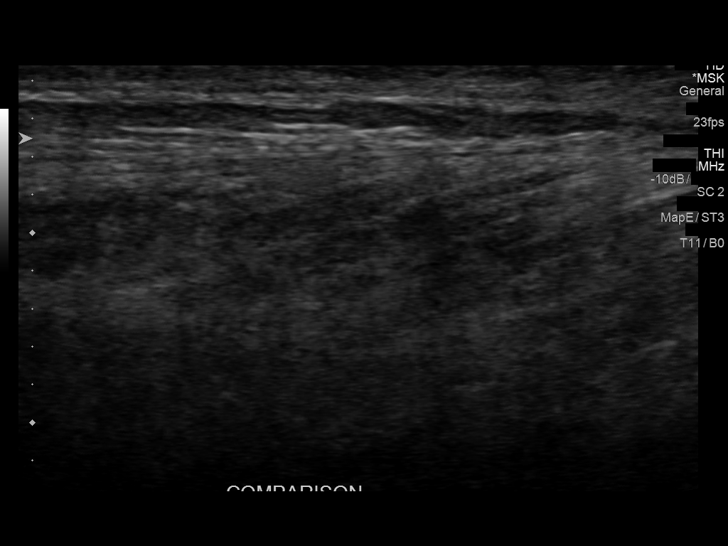
[im 17/19]
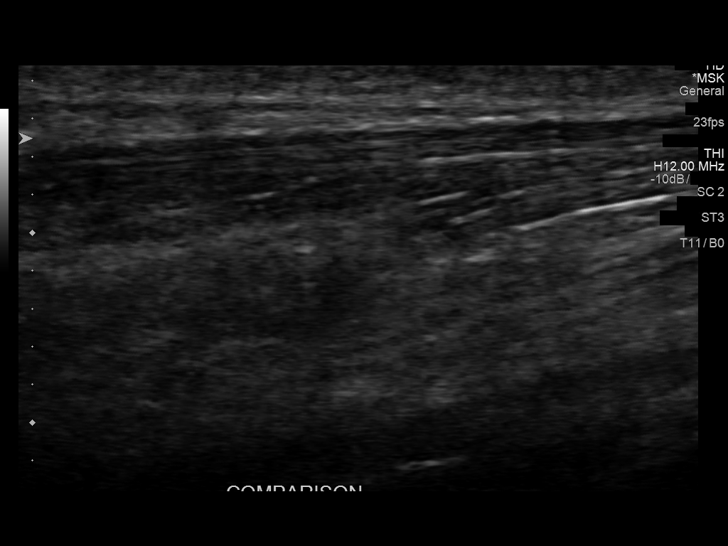
[im 19/19]
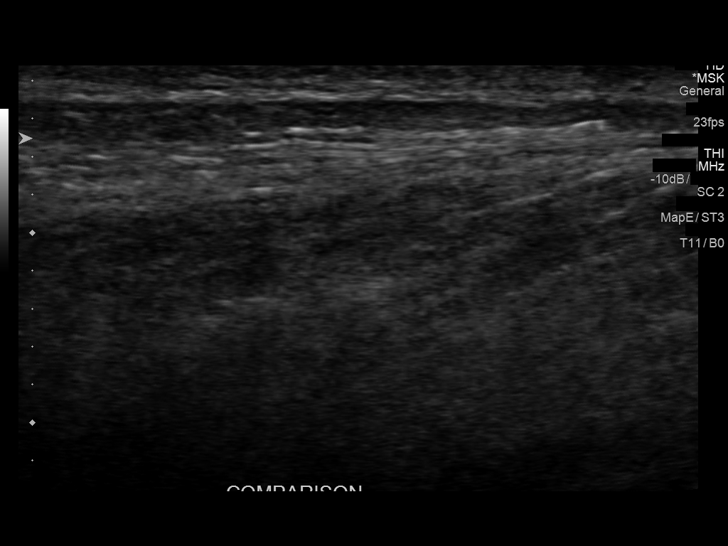

[14 of 19 positions shown; findings below may reference images not displayed]

FINDINGS: Joint Space: Not assessed.

Muscles: Normal.

Tendons: Not assessed

Other Soft Tissue Structures: There is an elongated hypoechoic area
in the superficial left lower extremity, at the site of clinical
abnormality, measuring 2.6 x 0.4 x 0.5 cm. Some internal color flow
is identified.
IMPRESSION: Elongated area of hypoechogenicity in the superficial soft tissues
of the left lower extremity, at the site of clinical abnormality,
with some internal flow, which may represent a small hematoma.
Difficult to definitively exclude a solid lesion. Consider
sonographic follow-up in 4-6 weeks, as clinically indicated.

## 2018-09-16 DIAGNOSIS — F332 Major depressive disorder, recurrent severe without psychotic features: Secondary | ICD-10-CM | POA: Diagnosis not present

## 2018-10-20 ENCOUNTER — Other Ambulatory Visit: Payer: Self-pay | Admitting: Family Medicine

## 2018-10-24 ENCOUNTER — Other Ambulatory Visit: Payer: Self-pay | Admitting: Family Medicine

## 2018-10-28 ENCOUNTER — Other Ambulatory Visit: Payer: Self-pay | Admitting: *Deleted

## 2018-10-28 NOTE — Telephone Encounter (Signed)
Call placed to patient. LMTRC.  

## 2018-10-28 NOTE — Telephone Encounter (Signed)
Received fax requesting refill on Flexeril.   Ok to refill? 

## 2018-10-28 NOTE — Telephone Encounter (Signed)
Why is she on flexeril?  What does she take this for?

## 2018-10-29 NOTE — Telephone Encounter (Signed)
Multiple calls placed to patient with no answer and no return call.   Message to be closed.  

## 2018-11-10 DIAGNOSIS — F332 Major depressive disorder, recurrent severe without psychotic features: Secondary | ICD-10-CM | POA: Diagnosis not present

## 2018-12-09 ENCOUNTER — Other Ambulatory Visit: Payer: Self-pay | Admitting: Family Medicine

## 2018-12-09 NOTE — Telephone Encounter (Signed)
Ok to refill 

## 2019-01-27 ENCOUNTER — Other Ambulatory Visit: Payer: Self-pay | Admitting: Family Medicine

## 2019-02-16 ENCOUNTER — Other Ambulatory Visit (INDEPENDENT_AMBULATORY_CARE_PROVIDER_SITE_OTHER): Payer: Self-pay | Admitting: Orthopaedic Surgery

## 2019-02-16 NOTE — Telephone Encounter (Signed)
Please advise 

## 2019-02-22 ENCOUNTER — Other Ambulatory Visit: Payer: Self-pay | Admitting: Family Medicine

## 2019-02-22 DIAGNOSIS — F419 Anxiety disorder, unspecified: Secondary | ICD-10-CM

## 2019-02-22 DIAGNOSIS — F17200 Nicotine dependence, unspecified, uncomplicated: Secondary | ICD-10-CM

## 2019-05-04 ENCOUNTER — Other Ambulatory Visit: Payer: Self-pay

## 2019-05-04 ENCOUNTER — Ambulatory Visit (INDEPENDENT_AMBULATORY_CARE_PROVIDER_SITE_OTHER): Payer: 59 | Admitting: Family Medicine

## 2019-05-04 VITALS — BP 132/80 | HR 72 | Temp 97.9°F | Resp 18 | Ht 62.0 in | Wt 160.0 lb

## 2019-05-04 DIAGNOSIS — Z23 Encounter for immunization: Secondary | ICD-10-CM

## 2019-05-04 DIAGNOSIS — Z0000 Encounter for general adult medical examination without abnormal findings: Secondary | ICD-10-CM | POA: Diagnosis not present

## 2019-05-04 DIAGNOSIS — Z1231 Encounter for screening mammogram for malignant neoplasm of breast: Secondary | ICD-10-CM | POA: Diagnosis not present

## 2019-05-04 DIAGNOSIS — Z8739 Personal history of other diseases of the musculoskeletal system and connective tissue: Secondary | ICD-10-CM
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
## 2019-05-05 LAB — COMPLETE METABOLIC PANEL WITH GFR
AG Ratio: 1.1 (calc) (ref 1.0–2.5)
ALT: 40 U/L — ABNORMAL HIGH (ref 6–29)
AST: 68 U/L — ABNORMAL HIGH (ref 10–35)
Albumin: 4 g/dL (ref 3.6–5.1)
Alkaline phosphatase (APISO): 197 U/L — ABNORMAL HIGH (ref 37–153)
BUN/Creatinine Ratio: 7 (calc) (ref 6–22)
BUN: 3 mg/dL — ABNORMAL LOW (ref 7–25)
CO2: 26 mmol/L (ref 20–32)
Calcium: 9.1 mg/dL (ref 8.6–10.4)
Chloride: 93 mmol/L — ABNORMAL LOW (ref 98–110)
Creat: 0.43 mg/dL — ABNORMAL LOW (ref 0.50–1.05)
GFR, Est African American: 130 mL/min/{1.73_m2} (ref >=60)
GFR, Est Non African American: 112 mL/min/{1.73_m2} (ref >=60)
Globulin: 3.6 g/dL (calc) (ref 1.9–3.7)
Glucose, Bld: 93 mg/dL (ref 65–99)
Potassium: 4 mmol/L (ref 3.5–5.3)
Sodium: 132 mmol/L — ABNORMAL LOW (ref 135–146)
Total Bilirubin: 0.7 mg/dL (ref 0.2–1.2)
Total Protein: 7.6 g/dL (ref 6.1–8.1)

## 2019-05-05 LAB — CBC WITH DIFFERENTIAL/PLATELET
Absolute Monocytes: 1077 cells/uL — ABNORMAL HIGH (ref 200–950)
Basophils Absolute: 200 cells/uL (ref 0–200)
Basophils Relative: 1.5 %
Eosinophils Absolute: 306 cells/uL (ref 15–500)
Eosinophils Relative: 2.3 %
HCT: 36.4 % (ref 35.0–45.0)
Hemoglobin: 12.5 g/dL (ref 11.7–15.5)
Lymphs Abs: 3179 cells/uL (ref 850–3900)
MCH: 32.8 pg (ref 27.0–33.0)
MCHC: 34.3 g/dL (ref 32.0–36.0)
MCV: 95.5 fL (ref 80.0–100.0)
MPV: 9.6 fL (ref 7.5–12.5)
Monocytes Relative: 8.1 %
Neutro Abs: 8539 cells/uL — ABNORMAL HIGH (ref 1500–7800)
Neutrophils Relative %: 64.2 %
Platelets: 375 10*3/uL (ref 140–400)
RBC: 3.81 10*6/uL (ref 3.80–5.10)
RDW: 11.1 % (ref 11.0–15.0)
Total Lymphocyte: 23.9 %
WBC: 13.3 10*3/uL — ABNORMAL HIGH (ref 3.8–10.8)

## 2019-05-05 LAB — LIPID PANEL
Cholesterol: 219 mg/dL — ABNORMAL HIGH (ref <200)
HDL: 55 mg/dL (ref >=50)
LDL Cholesterol (Calc): 145 mg/dL (calc) — ABNORMAL HIGH
Non-HDL Cholesterol (Calc): 164 mg/dL (calc) — ABNORMAL HIGH (ref <130)
Total CHOL/HDL Ratio: 4 (calc) (ref <5.0)
Triglycerides: 84 mg/dL (ref <150)

## 2019-05-05 LAB — URIC ACID: Uric Acid, Serum: 2 mg/dL — ABNORMAL LOW (ref 2.5–7.0)

## 2019-05-21 ENCOUNTER — Other Ambulatory Visit: Payer: Self-pay | Admitting: Family Medicine

## 2019-05-25 ENCOUNTER — Other Ambulatory Visit: Payer: Self-pay | Admitting: Family Medicine

## 2019-05-25 NOTE — Telephone Encounter (Signed)
Requested Prescriptions   Pending Prescriptions Disp Refills  . traMADol (ULTRAM) 50 MG tablet [Pharmacy Med Name: TRAMADOL HCL 50 MG TABLET] 50 tablet 0    Sig: TAKE 1 TABLET BY MOUTH EVERY 6 TO 8 HOURS AS NEEDED FOR PAIN    Last OV 05/04/2019  Last written 11/19/2018

## 2019-06-04 ENCOUNTER — Other Ambulatory Visit: Payer: Self-pay | Admitting: Family Medicine

## 2019-08-24 ENCOUNTER — Telehealth: Payer: Self-pay | Admitting: Radiology

## 2019-08-24 NOTE — Telephone Encounter (Signed)
Fax received from pharmacy requesting refill on Allopurinol 300mg  tablet, take one tablet by mouth daily, #90 with no refills.  OK to refill?

## 2019-08-25 MED ORDER — ALLOPURINOL 300 MG PO TABS: 300.0000 mg | ORAL_TABLET | Freq: Every day | ORAL | 1 refills | Status: DC

## 2019-08-25 NOTE — Addendum Note (Signed)
Addended by: Meyer Cory on: 08/25/2019 05:01 PM   Modules accepted: Orders

## 2019-08-25 NOTE — Telephone Encounter (Signed)
Ok thx.

## 2019-08-25 NOTE — Telephone Encounter (Signed)
Sent to pharmacy 

## 2019-09-13 ENCOUNTER — Other Ambulatory Visit (HOSPITAL_COMMUNITY): Payer: Self-pay | Admitting: Psychiatry

## 2019-09-16 ENCOUNTER — Other Ambulatory Visit: Payer: Self-pay | Admitting: Family Medicine

## 2019-11-11 ENCOUNTER — Other Ambulatory Visit: Payer: Self-pay | Admitting: Family Medicine

## 2019-11-11 MED ORDER — OMEPRAZOLE 20 MG PO CPDR: 20.0000 mg | DELAYED_RELEASE_CAPSULE | Freq: Every day | ORAL | 3 refills | Status: DC

## 2019-11-12 ENCOUNTER — Other Ambulatory Visit: Payer: Self-pay | Admitting: Family Medicine

## 2019-11-12 MED ORDER — OMEPRAZOLE 20 MG PO CPDR: 20.0000 mg | DELAYED_RELEASE_CAPSULE | Freq: Every day | ORAL | 3 refills | Status: DC

## 2019-12-02 ENCOUNTER — Other Ambulatory Visit (HOSPITAL_COMMUNITY): Payer: Self-pay | Admitting: Psychiatry

## 2019-12-02 DIAGNOSIS — F411 Generalized anxiety disorder: Secondary | ICD-10-CM

## 2019-12-10 ENCOUNTER — Other Ambulatory Visit: Payer: Self-pay | Admitting: Family Medicine

## 2019-12-10 MED ORDER — MELOXICAM 7.5 MG PO TABS: ORAL_TABLET | ORAL | 2 refills | Status: DC

## 2020-02-20 ENCOUNTER — Other Ambulatory Visit (INDEPENDENT_AMBULATORY_CARE_PROVIDER_SITE_OTHER): Payer: Self-pay | Admitting: Orthopaedic Surgery

## 2020-02-22 NOTE — Telephone Encounter (Signed)
Ok to rf? 

## 2020-03-08 ENCOUNTER — Encounter: Payer: Self-pay | Admitting: Nurse Practitioner

## 2020-03-08 ENCOUNTER — Other Ambulatory Visit: Payer: Self-pay

## 2020-03-08 ENCOUNTER — Ambulatory Visit (INDEPENDENT_AMBULATORY_CARE_PROVIDER_SITE_OTHER): Payer: Medicare HMO | Admitting: Nurse Practitioner

## 2020-03-08 VITALS — BP 132/76 | HR 72 | Temp 97.7°F | Ht 62.0 in | Wt 178.0 lb

## 2020-03-08 DIAGNOSIS — Z0001 Encounter for general adult medical examination with abnormal findings: Secondary | ICD-10-CM | POA: Diagnosis not present

## 2020-03-08 DIAGNOSIS — R3915 Urgency of urination: Secondary | ICD-10-CM | POA: Diagnosis not present

## 2020-03-08 DIAGNOSIS — Z0000 Encounter for general adult medical examination without abnormal findings: Secondary | ICD-10-CM | POA: Diagnosis not present

## 2020-03-08 DIAGNOSIS — I10 Essential (primary) hypertension: Secondary | ICD-10-CM | POA: Diagnosis not present

## 2020-03-08 LAB — URINALYSIS, COMPLETE
Bilirubin, UA: NEGATIVE
Glucose, UA: NEGATIVE
Ketones, UA: NEGATIVE
Leukocytes,UA: NEGATIVE
Nitrite, UA: NEGATIVE
Protein,UA: NEGATIVE
RBC, UA: NEGATIVE
Specific Gravity, UA: <1.005 — ABNORMAL LOW (ref 1.005–1.030)
Urobilinogen, Ur: 0.2 mg/dL (ref 0.2–1.0)
pH, UA: 6 (ref 5.0–7.5)

## 2020-03-08 LAB — MICROSCOPIC EXAMINATION
Bacteria, UA: NONE SEEN
Epithelial Cells (non renal): NONE SEEN /hpf (ref 0–10)
RBC, Urine: NONE SEEN /hpf (ref 0–2)
WBC, UA: NONE SEEN /hpf (ref 0–5)

## 2020-03-08 MED ORDER — MELOXICAM 7.5 MG PO TABS: ORAL_TABLET | ORAL | 2 refills | Status: DC

## 2020-03-08 MED ORDER — OMEPRAZOLE 20 MG PO CPDR: 20.0000 mg | DELAYED_RELEASE_CAPSULE | Freq: Every day | ORAL | 3 refills | Status: DC

## 2020-03-08 MED ORDER — ALLOPURINOL 300 MG PO TABS: 300.0000 mg | ORAL_TABLET | Freq: Every day | ORAL | 1 refills | Status: DC

## 2020-03-08 MED ORDER — METOPROLOL SUCCINATE ER 50 MG PO TB24: ORAL_TABLET | ORAL | 1 refills | Status: DC

## 2020-03-08 NOTE — Progress Notes (Signed)
New Patient Office Visit  Subjective:  Patient ID: Regina Richardson, female    DOB: 09/05/1960  Age: 59 y.o. MRN: [id number]  CC:  Chief Complaint  Patient presents with  . New Patient (Initial Visit)    HPI Regina Richardson presents for annual wellness check Encounter for general adult medical examination without abnormal findings  Physical : Patient's last physical exam was 1 year ago .  Weight: Appropriate for height (BMI greater  than 27%) ;  Blood Pressure: Normal (BP less than 120/80) ;  Medical History: Patient history reviewed ; Family history reviewed ;  Allergies Reviewed: No change in current allergies ;  Medications Reviewed: Medications reviewed - no changes ;  Lipids: Normal lipid levels ;  Smoking: Life-long smoker ;  Physical Activity: Exercises at least 3 times per week ; no Alcohol/Drug Use: Is a drinker ; No illicit drug use ;  Patient is  afflicted from Stress Incontinence and Urge Incontinence  Safety: reviewed ; Patient wears a seat belt, has smoke detectors, has carbon monoxide detectors, practices appropriate gun safety: N/A, and wears sunscreen with extended sun exposure. Dental Care: Annual cleanings, brushes and flosses daily. Ophthalmology/Optometry: Annual visit.  Hearing loss: none Vision impairments: none  Past Medical History:  Diagnosis Date  . Alcoholism ([place])   . Anxiety   . Arrhythmia    heart  . Arthritis   . Cancer ([place])   . Depression   . Diabetes ([place])    no medications taken, pt is unsure of dx  . GERD (gastroesophageal reflux disease)   . Hypertension   . Osteoporosis   . Sleep apnea    no CPAP  . Urinary incontinence     Past Surgical History:  Procedure Laterality Date  . ABDOMINAL HYSTERECTOMY     still has ovaries  . CERVICAL BIOPSY  W/ LOOP ELECTRODE EXCISION    . CESAREAN SECTION    . COLONOSCOPY    . COLPOSCOPY    . left collar bone surgery     had fx, pin placed and then complitcations caused them to remove most  of the bone  . left forearm surgery     from left elbow down    Family History  Problem Relation Age of Onset  . Alcohol abuse Mother   . Liver disease Mother   . Heart disease Father   . Alcohol abuse Father   . Diabetes Sister   . Liver disease Sister   . Diabetes Maternal Aunt   . Kidney disease Maternal Aunt   . Diabetes Cousin        mat cousin  . Breast cancer Neg Hx   . Colon cancer Neg Hx   . Esophageal cancer Neg Hx   . Stomach cancer Neg Hx   . Rectal cancer Neg Hx     Social History   Socioeconomic History  . Marital status: Married    Spouse name: Not on file  . Number of children: 1  . Years of education: Not on file  . Highest education level: Not on file  Occupational History  . Occupation: retired  Tobacco Use  . Smoking status: Current Every Day Smoker    Packs/day: 0.25    Years: 40.00    Pack years: 10.00    Types: Cigarettes  . Smokeless tobacco: Never Used  Vaping Use  . Vaping Use: Never used  Substance and Sexual Activity  . Alcohol use: Yes    Alcohol/week: 4.0 -  5.0 standard drinks    Types: 4 - 5 Standard drinks or equivalent per week  . Drug use: No  . Sexual activity: Not Currently    Birth control/protection: None  Other Topics Concern  . Not on file  Social History Narrative  . Not on file   Social Determinants of Health   Financial Resource Strain:   . Difficulty of Paying Living Expenses:   Food Insecurity:   . Worried About Charity fundraiser in the Last Year:   . Arboriculturist in the Last Year:   Transportation Needs:   . Film/video editor (Medical):   Marland Kitchen Lack of Transportation (Non-Medical):   Physical Activity:   . Days of Exercise per Week:   . Minutes of Exercise per Session:   Stress:   . Feeling of Stress :   Social Connections:   . Frequency of Communication with Friends and Family:   . Frequency of Social Gatherings with Friends and Family:   . Attends Religious Services:   . Active Member of  Clubs or Organizations:   . Attends Archivist Meetings:   Marland Kitchen Marital Status:   Intimate Partner Violence:   . Fear of Current or Ex-Partner:   . Emotionally Abused:   Marland Kitchen Physically Abused:   . Sexually Abused:     ROS Review of Systems  Constitutional: Negative.   HENT: Negative.   Eyes: Negative.   Respiratory: Negative.   Cardiovascular: Negative.   Musculoskeletal: Positive for back pain and myalgias.  Skin: Negative.   Neurological: Negative.   Hematological: Negative.   Psychiatric/Behavioral: Negative.     Objective:   Today's Vitals: BP 132/76   Pulse 72   Temp 97.7 F (36.5 C) (Temporal)   Ht 5\' 2"  (1.575 m)   Wt 178 lb (80.7 kg)   SpO2 96%   BMI 32.56 kg/m   Physical Exam Constitutional:      Appearance: Normal appearance.  HENT:     Head: Normocephalic.     Right Ear: External ear normal. There is no impacted cerumen.     Left Ear: External ear normal. There is no impacted cerumen.     Mouth/Throat:     Mouth: Mucous membranes are moist.     Pharynx: Oropharynx is clear.  Eyes:     Conjunctiva/sclera: Conjunctivae normal.  Cardiovascular:     Rate and Rhythm: Normal rate and regular rhythm.     Pulses: Normal pulses.     Heart sounds: Normal heart sounds.  Pulmonary:     Effort: Pulmonary effort is normal.     Breath sounds: Normal breath sounds.  Abdominal:     General: Bowel sounds are normal.  Musculoskeletal:        General: Tenderness present.  Skin:    General: Skin is warm.  Neurological:     Mental Status: She is alert. She is disoriented.  Psychiatric:     Comments: Depression     Assessment & Plan:  Annual physical exam Patient is a 59 year old female who presents to clinic for an annual physical exam.  Patient unable to give accurate history of health and accurate medication administration. patient reports that she recently relocated from [place].  Patient slightly disoriented today.  Completed head to toe  assessment.  Provided health maintenance education with printed handouts given.  Labs completed today CBC, CMP. Medication refill sent to pharmacy.     Urgency of urination Patient is reporting burning with urination,  urgency, and urinary retention in the last 7 days.  Patient has not used anything to alleviate symptoms.  Patient denies any fever chills or pelvic pain. Provided education to patient with printed handouts given Labs: Urine dip dipstick to rule out UTI completed.  Essential hypertension Pt presents for follow up of hypertension. Patient was diagnosed in _11/08/2014. The patient is tolerating the medication well without side effects. Compliance with treatment has been good; including taking medication as directed , maintains a healthy diet and following up as directed.  Current medication metoprolol 50 mg 1 tablet daily.  Rx refill sent to pharmacy.  Follow-up in 6 months.  Problem List Items Addressed This Visit      Cardiovascular and Mediastinum   Essential hypertension     Other   Annual physical exam - Primary    Patient is a 59 year old female who presents to clinic for an annual physical exam.  Patient has no new concerns.  Completed head to toe assessment.  Provided health maintenance education with printed handouts given. Labs completed today CBC CMP she is ready      Relevant Orders   CBC with Differential   Comprehensive metabolic panel   Lipid Panel       Office Visit from 03/08/2020 in [facility]  PHQ-9 Total Score 16      Outpatient Encounter Medications as of 03/08/2020  Medication Sig  . allopurinol (ZYLOPRIM) 300 MG tablet TAKE 1 TABLET BY MOUTH EVERY DAY  . buPROPion (WELLBUTRIN SR) 150 MG 12 hr tablet TAKE 1 TABLET BY MOUTH TWICE A DAY  . cyclobenzaprine (FLEXERIL) 10 MG tablet TAKE 1TAB 3 TIMES A DAY AS NEEDED FOR MUSCLE SPASMS. IF CAUSING DROWSINESS TAKE 1TAB AT BEDTIME ONLY  . hydrOXYzine (ATARAX/VISTARIL) 25 MG  tablet Take 25 mg by mouth 3 (three) times daily as needed.  . meloxicam (MOBIC) 7.5 MG tablet TAKE 1 TABLET BY MOUTH EVERY DAY WITH BREAKFAST  . metoprolol succinate (TOPROL-XL) 50 MG 24 hr tablet TAKE 1 TABLET BY MOUTH EVERY DAY WITH OR IMMEDIATELY FOLLOWING A MEAL  . omeprazole (PRILOSEC) 20 MG capsule Take 1 capsule (20 mg total) by mouth daily.  . traMADol (ULTRAM) 50 MG tablet TAKE 1 TABLET BY MOUTH EVERY 6 TO 8 HOURS AS NEEDED FOR PAIN   Facility-Administered Encounter Medications as of 03/08/2020  Medication  . 0.9 %  sodium chloride infusion  . methylPREDNISolone acetate (DEPO-MEDROL) injection 80 mg    Follow-up: Return in about 6 months (around 09/08/2020).   Ivy Lynn, NP

## 2020-03-08 NOTE — Assessment & Plan Note (Signed)
Patient is reporting burning with urination, urgency, and urinary retention in the last 7 days.  Patient has not used anything to alleviate symptoms.  Patient denies any fever chills or pelvic pain. Provided education to patient with printed handouts given Labs: Urine dip dipstick to rule out UTI completed.

## 2020-03-08 NOTE — Assessment & Plan Note (Addendum)
Patient is a 59 year old female who presents to clinic for an annual physical exam.  Patient unable to give accurate history of health and accurate medication administration. patient reports that she recently relocated from [place].  Patient slightly disoriented today.  Completed head to toe assessment.  Provided health maintenance education with printed handouts given.  Labs completed today CBC, CMP. Medication refill sent to pharmacy.

## 2020-03-08 NOTE — Assessment & Plan Note (Signed)
Pt presents for follow up of hypertension. Patient was diagnosed in _11/08/2014. The patient is tolerating the medication well without side effects. Compliance with treatment has been good; including taking medication as directed , maintains a healthy diet and following up as directed.  Current medication metoprolol 50 mg 1 tablet daily.  Rx refill sent to pharmacy.  Follow-up in 6 months.

## 2020-03-08 NOTE — Patient Instructions (Addendum)
Hypertension, Adult Hypertension is another name for high blood pressure. High blood pressure forces your heart to work harder to pump blood. This can cause problems over time. There are two numbers in a blood pressure reading. There is a top number (systolic) over a bottom number (diastolic). It is best to have a blood pressure that is below 120/80. Healthy choices can help lower your blood pressure, or you may need medicine to help lower it. What are the causes? The cause of this condition is not known. Some conditions may be related to high blood pressure. What increases the risk?  Smoking.  Having type 2 diabetes mellitus, high cholesterol, or both.  Not getting enough exercise or physical activity.  Being overweight.  Having too much fat, sugar, calories, or salt (sodium) in your diet.  Drinking too much alcohol.  Having long-term (chronic) kidney disease.  Having a family history of high blood pressure.  Age. Risk increases with age.  Race. You may be at higher risk if you are African American.  Gender. Men are at higher risk than women before age 45. After age 65, women are at higher risk than men.  Having obstructive sleep apnea.  Stress. What are the signs or symptoms?  High blood pressure may not cause symptoms. Very high blood pressure (hypertensive crisis) may cause: ? Headache. ? Feelings of worry or nervousness (anxiety). ? Shortness of breath. ? Nosebleed. ? A feeling of being sick to your stomach (nausea). ? Throwing up (vomiting). ? Changes in how you see. ? Very bad chest pain. ? Seizures. How is this treated?  This condition is treated by making healthy lifestyle changes, such as: ? Eating healthy foods. ? Exercising more. ? Drinking less alcohol.  Your health care provider may prescribe medicine if lifestyle changes are not enough to get your blood pressure under control, and if: ? Your top number is above 130. ? Your bottom number is above  80.  Your personal target blood pressure may vary. Follow these instructions at home: Eating and drinking   If told, follow the DASH eating plan. To follow this plan: ? Fill one half of your plate at each meal with fruits and vegetables. ? Fill one fourth of your plate at each meal with whole grains. Whole grains include whole-wheat pasta, brown rice, and whole-grain bread. ? Eat or drink low-fat dairy products, such as skim milk or low-fat yogurt. ? Fill one fourth of your plate at each meal with low-fat (lean) proteins. Low-fat proteins include fish, chicken without skin, eggs, beans, and tofu. ? Avoid fatty meat, cured and processed meat, or chicken with skin. ? Avoid pre-made or processed food.  Eat less than 1,500 mg of salt each day.  Do not drink alcohol if: ? Your doctor tells you not to drink. ? You are pregnant, may be pregnant, or are planning to become pregnant.  If you drink alcohol: ? Limit how much you use to:  0-1 drink a day for women.  0-2 drinks a day for men. ? Be aware of how much alcohol is in your drink. In the [place] one drink equals one 12 oz bottle of beer (355 mL), one 5 oz glass of wine (148 mL), or one 1 oz glass of hard liquor (44 mL). Lifestyle   Work with your doctor to stay at a healthy weight or to lose weight. Ask your doctor what the best weight is for you.  Get at least 30 minutes of exercise most   days of the week. This may include walking, swimming, or biking.  Get at least 30 minutes of exercise that strengthens your muscles (resistance exercise) at least 3 days a week. This may include lifting weights or doing Pilates.  Do not use any products that contain nicotine or tobacco, such as cigarettes, e-cigarettes, and chewing tobacco. If you need help quitting, ask your doctor.  Check your blood pressure at home as told by your doctor.  Keep all follow-up visits as told by your doctor. This is important. Medicines  Take over-the-counter  and prescription medicines only as told by your doctor. Follow directions carefully.  Do not skip doses of blood pressure medicine. The medicine does not work as well if you skip doses. Skipping doses also puts you at risk for problems.  Ask your doctor about side effects or reactions to medicines that you should watch for. Contact a doctor if you:  Think you are having a reaction to the medicine you are taking.  Have headaches that keep coming back (recurring).  Feel dizzy.  Have swelling in your ankles.  Have trouble with your vision. Get help right away if you:  Get a very bad headache.  Start to feel mixed up (confused).  Feel weak or numb.  Feel faint.  Have very bad pain in your: ? Chest. ? Belly (abdomen).  Throw up more than once.  Have trouble breathing. Summary  Hypertension is another name for high blood pressure.  High blood pressure forces your heart to work harder to pump blood.  For most people, a normal blood pressure is less than 120/80.  Making healthy choices can help lower blood pressure. If your blood pressure does not get lower with healthy choices, you may need to take medicine. This information is not intended to replace advice given to you by your health care provider. Make sure you discuss any questions you have with your health care provider. Document Revised: 03/19/2018 Document Reviewed: 03/19/2018 Elsevier Patient Education  2020 [place]. Acute Urinary Retention, Female  Acute urinary retention means that you cannot pee (urinate) at all, or that you pee too little and your bladder is not emptied completely. If it is not treated, it can lead to kidney damage or other serious problems. Follow these instructions at home:  Take over-the-counter and prescription medicines only as told by your doctor. Ask your doctor what medicines you should stay away from. Do not take any medicine unless your doctor says it is okay to do so.  If you  were sent home with a tube that drains pee from the bladder (catheter), take care of it as told by your doctor.  Drink enough fluid to keep your pee clear or pale yellow.  If you were given an antibiotic, take it as told by your doctor. Do not stop taking the antibiotic even if you start to feel better.  Do not use any products that contain nicotine or tobacco, such as cigarettes and e-cigarettes. If you need help quitting, ask your doctor.  Watch for changes in your symptoms. Tell your doctor about them.  If told, keep track of any changes in your blood pressure at home. Tell your doctor about them.  Keep all follow-up visits as told by your doctor. This is important. Contact a doctor if:  You have spasms or you leak pee when you have spasms. Get help right away if:  You have chills or a fever.  You have blood in your pee.  You have a tube that drains the bladder and: ? The tube stops draining pee. ? The tube falls out. Summary  Acute urinary retention means that you cannot pee at all, or that you pee too little and your bladder is not emptied completely. If it is not treated, it can result in kidney damage or other serious problems.  If you were sent home with a tube that drains pee from the bladder, take care of it as told by your doctor.  Pay attention to any changes in your symptoms. Tell your doctor about them. This information is not intended to replace advice given to you by your health care provider. Make sure you discuss any questions you have with your health care provider. Document Revised: 06/21/2017 Document Reviewed: 08/10/2016 Elsevier Patient Education  [place] Maintenance, Female Adopting a healthy lifestyle and getting preventive care are important in promoting health and wellness. Ask your health care provider about:  The right schedule for you to have regular tests and exams.  Things you can do on your own to prevent diseases and keep  yourself healthy. What should I know about diet, weight, and exercise? Eat a healthy diet   Eat a diet that includes plenty of vegetables, fruits, low-fat dairy products, and lean protein.  Do not eat a lot of foods that are high in solid fats, added sugars, or sodium. Maintain a healthy weight Body mass index (BMI) is used to identify weight problems. It estimates body fat based on height and weight. Your health care provider can help determine your BMI and help you achieve or maintain a healthy weight. Get regular exercise Get regular exercise. This is one of the most important things you can do for your health. Most adults should:  Exercise for at least 150 minutes each week. The exercise should increase your heart rate and make you sweat (moderate-intensity exercise).  Do strengthening exercises at least twice a week. This is in addition to the moderate-intensity exercise.  Spend less time sitting. Even light physical activity can be beneficial. Watch cholesterol and blood lipids Have your blood tested for lipids and cholesterol at 60 years of age, then have this test every 5 years. Have your cholesterol levels checked more often if:  Your lipid or cholesterol levels are high.  You are older than 60 years of age.  You are at high risk for heart disease. What should I know about cancer screening? Depending on your health history and family history, you may need to have cancer screening at various ages. This may include screening for:  Breast cancer.  Cervical cancer.  Colorectal cancer.  Skin cancer.  Lung cancer. What should I know about heart disease, diabetes, and high blood pressure? Blood pressure and heart disease  High blood pressure causes heart disease and increases the risk of stroke. This is more likely to develop in people who have high blood pressure readings, are of African descent, or are overweight.  Have your blood pressure checked: ? Every 3-5 years  if you are 45-40 years of age. ? Every year if you are 71 years old or older. Diabetes Have regular diabetes screenings. This checks your fasting blood sugar level. Have the screening done:  Once every three years after age 34 if you are at a normal weight and have a low risk for diabetes.  More often and at a younger age if you are overweight or have a high risk for diabetes. What should I know  about preventing infection? Hepatitis B If you have a higher risk for hepatitis B, you should be screened for this virus. Talk with your health care provider to find out if you are at risk for hepatitis B infection. Hepatitis C Testing is recommended for:  Everyone born from [birth year] through 1966.  Anyone with known risk factors for hepatitis C. Sexually transmitted infections (STIs)  Get screened for STIs, including gonorrhea and chlamydia, if: ? You are sexually active and are younger than 60 years of age. ? You are older than 60 years of age and your health care provider tells you that you are at risk for this type of infection. ? Your sexual activity has changed since you were last screened, and you are at increased risk for chlamydia or gonorrhea. Ask your health care provider if you are at risk.  Ask your health care provider about whether you are at high risk for HIV. Your health care provider may recommend a prescription medicine to help prevent HIV infection. If you choose to take medicine to prevent HIV, you should first get tested for HIV. You should then be tested every 3 months for as long as you are taking the medicine. Pregnancy  If you are about to stop having your period (premenopausal) and you may become pregnant, seek counseling before you get pregnant.  Take 400 to 800 micrograms (mcg) of folic acid every day if you become pregnant.  Ask for birth control (contraception) if you want to prevent pregnancy. Osteoporosis and menopause Osteoporosis is a disease in which the bones  lose minerals and strength with aging. This can result in bone fractures. If you are 41 years old or older, or if you are at risk for osteoporosis and fractures, ask your health care provider if you should:  Be screened for bone loss.  Take a calcium or vitamin D supplement to lower your risk of fractures.  Be given hormone replacement therapy (HRT) to treat symptoms of menopause. Follow these instructions at home: Lifestyle  Do not use any products that contain nicotine or tobacco, such as cigarettes, e-cigarettes, and chewing tobacco. If you need help quitting, ask your health care provider.  Do not use street drugs.  Do not share needles.  Ask your health care provider for help if you need support or information about quitting drugs. Alcohol use  Do not drink alcohol if: ? Your health care provider tells you not to drink. ? You are pregnant, may be pregnant, or are planning to become pregnant.  If you drink alcohol: ? Limit how much you use to 0-1 drink a day. ? Limit intake if you are breastfeeding.  Be aware of how much alcohol is in your drink. In the [place] one drink equals one 12 oz bottle of beer (355 mL), one 5 oz glass of wine (148 mL), or one 1 oz glass of hard liquor (44 mL). General instructions  Schedule regular health, dental, and eye exams.  Stay current with your vaccines.  Tell your health care provider if: ? You often feel depressed. ? You have ever been abused or do not feel safe at home. Summary  Adopting a healthy lifestyle and getting preventive care are important in promoting health and wellness.  Follow your health care provider's instructions about healthy diet, exercising, and getting tested or screened for diseases.  Follow your health care provider's instructions on monitoring your cholesterol and blood pressure. This information is not intended to replace advice given to you  by your health care provider. Make sure you discuss any questions  you have with your health care provider. Document Revised: 07/02/2018 Document Reviewed: 07/02/2018 Elsevier Patient Education  2020 [employer].

## 2020-03-09 LAB — COMPREHENSIVE METABOLIC PANEL
ALT: 38 IU/L — ABNORMAL HIGH (ref 0–32)
AST: 89 IU/L — ABNORMAL HIGH (ref 0–40)
Albumin/Globulin Ratio: 1 — ABNORMAL LOW (ref 1.2–2.2)
Albumin: 4.1 g/dL (ref 3.8–4.9)
Alkaline Phosphatase: 205 IU/L — ABNORMAL HIGH (ref 48–121)
BUN/Creatinine Ratio: 5 — ABNORMAL LOW (ref 9–23)
BUN: 3 mg/dL — ABNORMAL LOW (ref 6–24)
Bilirubin Total: 0.7 mg/dL (ref 0.0–1.2)
CO2: 23 mmol/L (ref 20–29)
Calcium: 9.5 mg/dL (ref 8.7–10.2)
Chloride: 97 mmol/L (ref 96–106)
Creatinine, Ser: 0.6 mg/dL (ref 0.57–1.00)
GFR calc Af Amer: 115 mL/min/{1.73_m2} (ref >59)
GFR calc non Af Amer: 100 mL/min/{1.73_m2} (ref >59)
Globulin, Total: 4.1 g/dL (ref 1.5–4.5)
Glucose: 104 mg/dL — ABNORMAL HIGH (ref 65–99)
Potassium: 3.8 mmol/L (ref 3.5–5.2)
Sodium: 135 mmol/L (ref 134–144)
Total Protein: 8.2 g/dL (ref 6.0–8.5)

## 2020-03-09 LAB — CBC WITH DIFFERENTIAL/PLATELET
Basophils Absolute: 0.2 10*3/uL (ref 0.0–0.2)
Basos: 2 %
EOS (ABSOLUTE): 0.2 10*3/uL (ref 0.0–0.4)
Eos: 2 %
Hematocrit: 35 % (ref 34.0–46.6)
Hemoglobin: 12.4 g/dL (ref 11.1–15.9)
Immature Grans (Abs): 0 10*3/uL (ref 0.0–0.1)
Immature Granulocytes: 0 %
Lymphocytes Absolute: 3.8 10*3/uL — ABNORMAL HIGH (ref 0.7–3.1)
Lymphs: 31 %
MCH: 33.8 pg — ABNORMAL HIGH (ref 26.6–33.0)
MCHC: 35.4 g/dL (ref 31.5–35.7)
MCV: 95 fL (ref 79–97)
Monocytes Absolute: 1.2 10*3/uL — ABNORMAL HIGH (ref 0.1–0.9)
Monocytes: 10 %
Neutrophils Absolute: 6.9 10*3/uL (ref 1.4–7.0)
Neutrophils: 55 %
Platelets: 304 10*3/uL (ref 150–450)
RBC: 3.67 x10E6/uL — ABNORMAL LOW (ref 3.77–5.28)
RDW: 11.9 % (ref 11.7–15.4)
WBC: 12.3 10*3/uL — ABNORMAL HIGH (ref 3.4–10.8)

## 2020-03-09 LAB — LIPID PANEL
Chol/HDL Ratio: 3.8 ratio (ref 0.0–4.4)
Cholesterol, Total: 250 mg/dL — ABNORMAL HIGH (ref 100–199)
HDL: 65 mg/dL (ref >39)
LDL Chol Calc (NIH): 166 mg/dL — ABNORMAL HIGH (ref 0–99)
Triglycerides: 107 mg/dL (ref 0–149)
VLDL Cholesterol Cal: 19 mg/dL (ref 5–40)

## 2020-03-09 LAB — TSH: TSH: 2.55 u[IU]/mL (ref 0.450–4.500)

## 2020-03-15 ENCOUNTER — Telehealth: Payer: Self-pay | Admitting: Family Medicine

## 2020-03-16 NOTE — Telephone Encounter (Signed)
Lmtcb.

## 2020-03-18 NOTE — Telephone Encounter (Signed)
Called patient, call was picked up, no answer, and hung up.

## 2020-03-18 NOTE — Telephone Encounter (Signed)
Spoke to patient - c/o passing blood when she has a BM x 1 wk - started with when she wiped and is now blood clots at times, she states that is is bright red.  Patient has hemorrhoids - denies any rectal pain or changes in BM states that they can be hard at times. Patient is also complaining of LBP (but has scoliosis) and swelling in both ankles since yesterday.  I advised the patient to go to the ED or urgent care but patient does not want to go and wanted a message get sent to her provider.  Please review and advised

## 2020-03-18 NOTE — Telephone Encounter (Signed)
With so much going on, I recommend she be seen in person at ED or urgent care as well, especially since we are going into the weekend.

## 2020-03-24 ENCOUNTER — Encounter: Payer: Self-pay | Admitting: Family Medicine

## 2020-03-24 ENCOUNTER — Other Ambulatory Visit: Payer: Self-pay

## 2020-03-24 ENCOUNTER — Ambulatory Visit (INDEPENDENT_AMBULATORY_CARE_PROVIDER_SITE_OTHER): Payer: Medicare HMO | Admitting: Family Medicine

## 2020-03-24 VITALS — BP 153/76 | HR 75 | Temp 98.0°F | Ht 62.0 in | Wt 171.6 lb

## 2020-03-24 DIAGNOSIS — M65352 Trigger finger, left little finger: Secondary | ICD-10-CM | POA: Diagnosis not present

## 2020-03-24 DIAGNOSIS — Z860100 Personal history of colon polyps, unspecified: Secondary | ICD-10-CM

## 2020-03-24 DIAGNOSIS — E119 Type 2 diabetes mellitus without complications: Secondary | ICD-10-CM | POA: Diagnosis not present

## 2020-03-24 DIAGNOSIS — M5136 Other intervertebral disc degeneration, lumbar region: Secondary | ICD-10-CM

## 2020-03-24 DIAGNOSIS — K921 Melena: Secondary | ICD-10-CM | POA: Diagnosis not present

## 2020-03-24 DIAGNOSIS — R252 Cramp and spasm: Secondary | ICD-10-CM | POA: Diagnosis not present

## 2020-03-24 DIAGNOSIS — R748 Abnormal levels of other serum enzymes: Secondary | ICD-10-CM

## 2020-03-24 DIAGNOSIS — R198 Other specified symptoms and signs involving the digestive system and abdomen: Secondary | ICD-10-CM

## 2020-03-24 DIAGNOSIS — I10 Essential (primary) hypertension: Secondary | ICD-10-CM

## 2020-03-24 DIAGNOSIS — M51369 Other intervertebral disc degeneration, lumbar region without mention of lumbar back pain or lower extremity pain: Secondary | ICD-10-CM

## 2020-03-24 DIAGNOSIS — R310 Gross hematuria: Secondary | ICD-10-CM | POA: Diagnosis not present

## 2020-03-24 DIAGNOSIS — M65342 Trigger finger, left ring finger: Secondary | ICD-10-CM | POA: Diagnosis not present

## 2020-03-24 DIAGNOSIS — R300 Dysuria: Secondary | ICD-10-CM | POA: Diagnosis not present

## 2020-03-24 DIAGNOSIS — L258 Unspecified contact dermatitis due to other agents: Secondary | ICD-10-CM

## 2020-03-24 DIAGNOSIS — R32 Unspecified urinary incontinence: Secondary | ICD-10-CM

## 2020-03-24 LAB — URINALYSIS, COMPLETE
Bilirubin, UA: NEGATIVE
Glucose, UA: NEGATIVE
Ketones, UA: NEGATIVE
Leukocytes,UA: NEGATIVE
Nitrite, UA: NEGATIVE
Protein,UA: NEGATIVE
Specific Gravity, UA: <1.005 — ABNORMAL LOW (ref 1.005–1.030)
Urobilinogen, Ur: 0.2 mg/dL (ref 0.2–1.0)
pH, UA: 6 (ref 5.0–7.5)

## 2020-03-24 LAB — MICROSCOPIC EXAMINATION
Bacteria, UA: NONE SEEN
RBC, Urine: NONE SEEN /hpf (ref 0–2)
WBC, UA: NONE SEEN /hpf (ref 0–5)

## 2020-03-24 NOTE — Patient Instructions (Signed)
[employer] Butt P[employer]

## 2020-03-24 NOTE — Progress Notes (Signed)
Assessment & Plan:  1. Gross hematuria - Urinalysis, Complete: Urine dipstick shows positive for RBC's.  Micro exam: negative for WBC's or RBC's. - Ambulatory referral to Urology  2-4. Frank blood in stool/Change in bowel movement/History of colon polyps - Ambulatory referral to Gastroenterology  5-7. Other intervertebral disc degeneration, lumbar region/Trigger ring finger of left hand/Trigger little finger of left hand - Ambulatory referral to Orthopedic Surgery  8. Cramping of hands - Magnesium  9. Elevated liver enzymes - Gamma GT - Nucleotidase, 5', blood - Hepatitis panel, acute  10. Essential hypertension - Elevated today, but patient has a lot going on today.   11. Dermatitis associated with moisture from urinary incontinence - Encouraged to use barrier cream and keep herself as dry as possible.    Return as scheduled or sooner if needed for additional concerns.  Hendricks Limes, MSN, APRN, FNP-C [facility]  Subjective:    Patient ID: Regina Richardson, female    DOB: 05/10/1961, 58 y.o.   MRN: [id number]  Patient Care Team: Vondra Brooklyn, FNP as PCP - General (Family Medicine)   Chief Complaint:  Chief Complaint  Patient presents with   Hematuria    x 1 week    Blood In Stools    x 1 week   Leg Swelling    Patient states that she has been having bilateral leg swelling that has been going on x 1 year.   Arthritis    Patient states that her fingers locked up twice yesterday.    HPI: Regina Richardson is a 59 y.o. female presenting on 03/24/2020 for Hematuria (x 1 week ), Blood In Stools (x 1 week), Leg Swelling (Patient states that she has been having bilateral leg swelling that has been going on x 1 year.), and Arthritis (Patient states that her fingers locked up twice yesterday.)  Patient reports she was seeing blood in her urine x1 week that stopped two days ago. This has never occurred in the past.   She also reports blood in  her stool x1 week. The blood she sees is bright red. She reports she is having a BM every time she eats that is runny. She denies any mucus in her stool. She has a history of constipation with hemorrhoids and has had hemorrhoids removed in the past. Her last colonoscopy was in 2019 which showed polyps, 1 of which was precancerous. From what I can see she was to return in 5 years, but she reports she was told to return in 2 years and she has been unable due to COVID-19.   She reports swelling in her legs, ankles, and stomach that has been going on for the past year.  She complains of back pain and arthritis. States she went to an orthopedic in [place] where she received injections in her left hand for trigger finger of the ring and pinky fingers. She would like to see an orthopedic again but does not want to travel to [place].    Social history:  Relevant past medical, surgical, family and social history reviewed and updated as indicated. Interim medical history since our last visit reviewed.  Allergies and medications reviewed and updated.  DATA REVIEWED: CHART IN EPIC  ROS: Negative unless specifically indicated above in HPI.    Current Outpatient Medications:    allopurinol (ZYLOPRIM) 300 MG tablet, Take 1 tablet (300 mg total) by mouth daily., Disp: 90 tablet, Rfl: 1   clonazePAM (KLONOPIN) 2 MG tablet, Take  2 mg by mouth daily as needed., Disp: , Rfl:    cyclobenzaprine (FLEXERIL) 10 MG tablet, TAKE 1TAB 3 TIMES A DAY AS NEEDED FOR MUSCLE SPASMS. IF CAUSING DROWSINESS TAKE 1TAB AT BEDTIME ONLY, Disp: 30 tablet, Rfl: 5   DULoxetine (CYMBALTA) 60 MG capsule, Take 60 mg by mouth daily., Disp: , Rfl:    hydrOXYzine (ATARAX/VISTARIL) 25 MG tablet, Take 25 mg by mouth 3 (three) times daily as needed., Disp: , Rfl:    meloxicam (MOBIC) 7.5 MG tablet, TAKE 1 TABLET BY MOUTH EVERY DAY WITH BREAKFAST, Disp: 30 tablet, Rfl: 2   metoprolol succinate (TOPROL-XL) 50 MG 24 hr tablet, TAKE  1 TABLET BY MOUTH EVERY DAY WITH OR IMMEDIATELY FOLLOWING A MEAL, Disp: 90 tablet, Rfl: 1   omeprazole (PRILOSEC) 20 MG capsule, Take 1 capsule (20 mg total) by mouth daily., Disp: 90 capsule, Rfl: 3  Current Facility-Administered Medications:    0.9 %  sodium chloride infusion, 500 mL, Intravenous, Once, Ladene Artist, MD   methylPREDNISolone acetate (DEPO-MEDROL) injection 80 mg, 80 mg, Other, Once, Magnus Sinning, MD   No Known Allergies Past Medical History:  Diagnosis Date   Alcoholism ([place])    Anxiety    Arrhythmia    heart   Arthritis    Cancer ([place])    Depression    Diabetes ([place])    no medications taken, pt is unsure of dx   GERD (gastroesophageal reflux disease)    Hypertension    Osteoporosis    Sleep apnea    no CPAP   Urinary incontinence     Past Surgical History:  Procedure Laterality Date   ABDOMINAL HYSTERECTOMY     still has ovaries   CERVICAL BIOPSY  W/ LOOP ELECTRODE EXCISION     CESAREAN SECTION     COLONOSCOPY     COLPOSCOPY     left collar bone surgery     had fx, pin placed and then complitcations caused them to remove most of the bone   left forearm surgery     from left elbow down    Social History   Socioeconomic History   Marital status: Married    Spouse name: Not on file   Number of children: 1   Years of education: Not on file   Highest education level: Not on file  Occupational History   Occupation: retired  Tobacco Use   Smoking status: Current Every Day Smoker    Packs/day: 0.50    Years: 40.00    Pack years: 20.00    Types: Cigarettes   Smokeless tobacco: Never Used  Scientific laboratory technician Use: Never used  Substance and Sexual Activity   Alcohol use: Yes    Alcohol/week: 12.0 standard drinks    Types: 12 Cans of beer per week   Drug use: No   Sexual activity: Not Currently    Birth control/protection: None  Other Topics Concern   Not on file  Social History Narrative   Not on  file   Social Determinants of Health   Financial Resource Strain:    Difficulty of Paying Living Expenses: Not on file  Food Insecurity:    Worried About [place] in the Last Year: Not on file   [employer] of Food in the Last Year: Not on file  Transportation Needs:    Lack of Transportation (Medical): Not on file   Lack of Transportation (Non-Medical): Not on file  Physical Activity:  Days of Exercise per Week: Not on file   Minutes of Exercise per Session: Not on file  Stress:    Feeling of Stress : Not on file  Social Connections:    Frequency of Communication with Friends and Family: Not on file   Frequency of Social Gatherings with Friends and Family: Not on file   Attends Religious Services: Not on file   Active Member of Clubs or Organizations: Not on file   Attends Archivist Meetings: Not on file   Marital Status: Not on file  Intimate Partner Violence:    Fear of Current or Ex-Partner: Not on file   Emotionally Abused: Not on file   Physically Abused: Not on file   Sexually Abused: Not on file        Objective:    BP (!) 153/76    Pulse 75    Temp 98 F (36.7 C) (Temporal)    Ht 5\' 2"  (1.575 m)    Wt 171 lb 9.6 oz (77.8 kg)    SpO2 98%    BMI 31.39 kg/m   Wt Readings from Last 3 Encounters:  03/24/20 171 lb 9.6 oz (77.8 kg)  03/08/20 178 lb (80.7 kg)  05/04/19 160 lb (72.6 kg)    Physical Exam Vitals reviewed. Exam conducted with a chaperone present.  Constitutional:      General: She is not in acute distress.    Appearance: Normal appearance. She is obese. She is not ill-appearing, toxic-appearing or diaphoretic.  HENT:     Head: Normocephalic and atraumatic.  Eyes:     General: No scleral icterus.       Right eye: No discharge.        Left eye: No discharge.     Conjunctiva/sclera: Conjunctivae normal.  Cardiovascular:     Rate and Rhythm: Normal rate and regular rhythm.     Heart sounds: Normal heart sounds.  No murmur heard.  No friction rub. No gallop.   Pulmonary:     Effort: Pulmonary effort is normal. No respiratory distress.     Breath sounds: Normal breath sounds. No stridor. No wheezing, rhonchi or rales.  Genitourinary:    Rectum: External hemorrhoid (not thrombosed) present.  Musculoskeletal:        General: Normal range of motion.     Cervical back: Normal range of motion.  Skin:    General: Skin is warm and dry.     Capillary Refill: Capillary refill takes less than 2 seconds.     Comments: Skin breakdown to inner thigh that patient reports is due to urinary incontinence.   Neurological:     General: No focal deficit present.     Mental Status: She is alert and oriented to person, place, and time. Mental status is at baseline.  Psychiatric:        Mood and Affect: Mood normal.        Behavior: Behavior normal.        Thought Content: Thought content normal.        Judgment: Judgment normal.     Lab Results  Component Value Date   TSH 2.550 03/08/2020   Lab Results  Component Value Date   WBC 12.3 (H) 03/08/2020   HGB 12.4 03/08/2020   HCT 35.0 03/08/2020   MCV 95 03/08/2020   PLT 304 03/08/2020   Lab Results  Component Value Date   NA 135 03/08/2020   K 3.8 03/08/2020   CO2 23 03/08/2020  GLUCOSE 104 (H) 03/08/2020   BUN 3 (L) 03/08/2020   CREATININE 0.60 03/08/2020   BILITOT 0.7 03/08/2020   ALKPHOS 205 (H) 03/08/2020   AST 89 (H) 03/08/2020   ALT 38 (H) 03/08/2020   PROT 8.2 03/08/2020   ALBUMIN 4.1 03/08/2020   CALCIUM 9.5 03/08/2020   Lab Results  Component Value Date   CHOL 250 (H) 03/08/2020   Lab Results  Component Value Date   HDL 65 03/08/2020   Lab Results  Component Value Date   LDLCALC 166 (H) 03/08/2020   Lab Results  Component Value Date   TRIG 107 03/08/2020   Lab Results  Component Value Date   CHOLHDL 3.8 03/08/2020   No results found for: HGBA1C

## 2020-03-25 LAB — MAGNESIUM: Magnesium: 1.7 mg/dL (ref 1.6–2.3)

## 2020-03-25 LAB — HEPATITIS PANEL, ACUTE
Hep A IgM: NEGATIVE
Hep B C IgM: NEGATIVE
Hep C Virus Ab: <0.1 s/co ratio (ref 0.0–0.9)
Hepatitis B Surface Ag: NEGATIVE

## 2020-03-25 LAB — GAMMA GT: GGT: 870 IU/L (ref 0–60)

## 2020-03-25 LAB — NUCLEOTIDASE, 5', BLOOD: 5-Nucleotidase: 8 IU/L (ref 0–10)

## 2020-03-26 ENCOUNTER — Other Ambulatory Visit: Payer: Self-pay | Admitting: Family Medicine

## 2020-03-27 ENCOUNTER — Encounter: Payer: Self-pay | Admitting: Family Medicine

## 2020-03-29 ENCOUNTER — Telehealth: Payer: Self-pay

## 2020-03-29 NOTE — Telephone Encounter (Signed)
Patient pcp sent message about patient wanting to come here instead of [facility], does not want to drive to [place].  Can we see her?

## 2020-03-29 NOTE — Telephone Encounter (Signed)
I spoke with Regina Richardson and it is ok to schedule ov.

## 2020-03-30 ENCOUNTER — Encounter: Payer: Self-pay | Admitting: Internal Medicine

## 2020-03-31 NOTE — Progress Notes (Signed)
Subjective: 1. Urgency of urination   2. Gross hematuria     Regina Richardson is a 59 yo female who is sent by Hendricks Limes NP for gross hematuria over the last 2-3 weeks.  She had heavy bleeding with clots but that has cleared.  She has also had bloody stools and is to see a GI.  She has had some lower abdominal pain.  She has nocturia 5-6x.  Shge has daytime frequency.  She has urgency and some UUI and SUI.  She has no history of stones or UTI's.  She has had no GU surgery.   The blood in the stool has cleared.   She had labs on 8/17 and her Cr was 0.65.   I reviewed her CT films and report from 1/19 that showed no GU abnormalities.  She had a GI finding and colonoscopy demonstrated polyps which were removed.   She has a history of cervical cancer that was treated with conization and subsequently she has had a hysterectomy.  She is a smoker.  She is a drinker and has some liver dysfunction.  ROS:  ROS  No Known Allergies  Past Medical History:  Diagnosis Date  . Alcoholism ([place])   . Anxiety   . Arrhythmia    heart  . Arthritis   . Cancer ([place])   . Depression   . GERD (gastroesophageal reflux disease)   . Hypertension   . Osteoporosis   . Sleep apnea    no CPAP  . Urinary incontinence     Past Surgical History:  Procedure Laterality Date  . ABDOMINAL HYSTERECTOMY     still has ovaries  . CERVICAL BIOPSY  W/ LOOP ELECTRODE EXCISION    . CESAREAN SECTION    . COLONOSCOPY    . COLPOSCOPY    . left collar bone surgery     had fx, pin placed and then complitcations caused them to remove most of the bone  . left forearm surgery     from left elbow down    Social History   Socioeconomic History  . Marital status: Married    Spouse name: Not on file  . Number of children: 1  . Years of education: Not on file  . Highest education level: Not on file  Occupational History  . Occupation: retired  Tobacco Use  . Smoking status: Current Every Day Smoker    Packs/day: 1.50    Years:  40.00    Pack years: 60.00    Types: Cigarettes  . Smokeless tobacco: Never Used  Vaping Use  . Vaping Use: Never used  Substance and Sexual Activity  . Alcohol use: Yes    Alcohol/week: 12.0 standard drinks    Types: 12 Cans of beer per week  . Drug use: No  . Sexual activity: Not Currently    Birth control/protection: None  Other Topics Concern  . Not on file  Social History Narrative  . Not on file   Social Determinants of Health   Financial Resource Strain:   . Difficulty of Paying Living Expenses: Not on file  Food Insecurity:   . Worried About Charity fundraiser in the Last Year: Not on file  . Ran Out of Food in the Last Year: Not on file  Transportation Needs:   . Lack of Transportation (Medical): Not on file  . Lack of Transportation (Non-Medical): Not on file  Physical Activity:   . Days of Exercise per Week: Not on file  .  Minutes of Exercise per Session: Not on file  Stress:   . Feeling of Stress : Not on file  Social Connections:   . Frequency of Communication with Friends and Family: Not on file  . Frequency of Social Gatherings with Friends and Family: Not on file  . Attends Religious Services: Not on file  . Active Member of Clubs or Organizations: Not on file  . Attends Archivist Meetings: Not on file  . Marital Status: Not on file  Intimate Partner Violence:   . Fear of Current or Ex-Partner: Not on file  . Emotionally Abused: Not on file  . Physically Abused: Not on file  . Sexually Abused: Not on file    Family History  Problem Relation Age of Onset  . Alcohol abuse Mother   . Liver disease Mother   . Heart disease Father   . Alcohol abuse Father   . Diabetes Sister   . Liver disease Sister   . Diabetes Maternal Aunt   . Kidney disease Maternal Aunt   . Diabetes Cousin        mat cousin  . Breast cancer Neg Hx   . Colon cancer Neg Hx   . Esophageal cancer Neg Hx   . Stomach cancer Neg Hx   . Rectal cancer Neg Hx      Anti-infectives: Anti-infectives (From admission, onward)   None      Current Outpatient Medications  Medication Sig Dispense Refill  . allopurinol (ZYLOPRIM) 300 MG tablet Take 1 tablet (300 mg total) by mouth daily. 90 tablet 1  . clonazePAM (KLONOPIN) 2 MG tablet Take 2 mg by mouth daily as needed.    . cyclobenzaprine (FLEXERIL) 10 MG tablet TAKE 1TAB 3 TIMES A DAY AS NEEDED FOR MUSCLE SPASMS. IF CAUSING DROWSINESS TAKE 1TAB AT BEDTIME ONLY 30 tablet 5  . DULoxetine (CYMBALTA) 60 MG capsule Take 60 mg by mouth daily.    . hydrOXYzine (ATARAX/VISTARIL) 25 MG tablet Take 25 mg by mouth 3 (three) times daily as needed.    . meloxicam (MOBIC) 7.5 MG tablet TAKE 1 TABLET BY MOUTH EVERY DAY WITH BREAKFAST 30 tablet 2  . metoprolol succinate (TOPROL-XL) 50 MG 24 hr tablet TAKE 1 TABLET BY MOUTH EVERY DAY WITH OR IMMEDIATELY FOLLOWING A MEAL 90 tablet 1  . omeprazole (PRILOSEC) 20 MG capsule Take 1 capsule (20 mg total) by mouth daily. 90 capsule 3   No current facility-administered medications for this visit.     Objective: Vital signs in last 24 hours: BP (!) 146/75   Pulse 79   Temp 99.3 F (37.4 C)   Ht 5' (1.524 m)   Wt 170 lb (77.1 kg)   BMI 33.20 kg/m   Intake/Output from previous day: No intake/output data recorded. Intake/Output this shift: @IOTHISSHIFT @   Physical Exam Vitals reviewed.  Constitutional:      Appearance: Normal appearance.  Cardiovascular:     Rate and Rhythm: Regular rhythm.     Heart sounds: Normal heart sounds.  Pulmonary:     Effort: Pulmonary effort is normal. No respiratory distress.     Breath sounds: Normal breath sounds.  Abdominal:     Palpations: Abdomen is soft.     Tenderness: There is abdominal tenderness (RUQ).     Comments: Rounded abdomen.  Musculoskeletal:        General: No swelling or tenderness. Normal range of motion.     Cervical back: Normal range of motion and neck supple.  Lymphadenopathy:     Cervical: No  cervical adenopathy.     Upper Body:     Right upper body: No supraclavicular adenopathy.     Left upper body: No supraclavicular adenopathy.  Skin:    General: Skin is warm and dry.  Neurological:     General: No focal deficit present.     Mental Status: She is alert and oriented to person, place, and time.  Psychiatric:        Mood and Affect: Mood normal.        Behavior: Behavior normal.     Lab Results:  Results for orders placed or performed in visit on 04/01/20 (from the past 24 hour(s))  POCT urinalysis dipstick     Status: None   Collection Time: 04/01/20  3:17 PM  Result Value Ref Range   Color, UA yellow    Clarity, UA clear    Glucose, UA Negative Negative   Bilirubin, UA negative    Ketones, UA negative    Spec Grav, UA     Blood, UA negative    pH, UA 6.0 5.0 - 8.0   Protein, UA Negative Negative   Urobilinogen, UA 0.2 0.2 or 1.0 E.U./dL   Nitrite, UA negative    Leukocytes, UA Negative Negative   Appearance     Odor      BMET No results for input(s): NA, K, CL, CO2, GLUCOSE, BUN, CREATININE, CALCIUM in the last 72 hours. PT/INR No results for input(s): LABPROT, INR in the last 72 hours. ABG No results for input(s): PHART, HCO3 in the last 72 hours.  Invalid input(s): PCO2, PO2  Studies/Results: No results found.   Assessment/Plan: Gross hematuria.  I am going to get her set up for a CT hematuria study and cystoscopy.    No orders of the defined types were placed in this encounter.    Orders Placed This Encounter  Procedures  . CT HEMATURIA WORKUP    Standing Status:   Future    Standing Expiration Date:   05/01/2020    Order Specific Question:   Reason for Exam (SYMPTOM  OR DIAGNOSIS REQUIRED)    Answer:   gross hematuria.    Order Specific Question:   Preferred imaging location?    Answer:   [facility]    Order Specific Question:   Radiology Contrast Protocol - do NOT remove file path    Answer:    [email or web address]    Order Specific Question:   Is patient pregnant?    Answer:   No  . POCT urinalysis dipstick     Return for Next available with CT results for possible cystoscopy. .    CC: Hendricks Limes NP.      Irine Seal 04/02/2020 [phone]

## 2020-04-01 ENCOUNTER — Other Ambulatory Visit: Payer: Self-pay

## 2020-04-01 ENCOUNTER — Encounter: Payer: Self-pay | Admitting: Urology

## 2020-04-01 ENCOUNTER — Ambulatory Visit (INDEPENDENT_AMBULATORY_CARE_PROVIDER_SITE_OTHER): Payer: Medicare HMO | Admitting: Urology

## 2020-04-01 VITALS — BP 146/75 | HR 79 | Temp 99.3°F | Ht 60.0 in | Wt 170.0 lb

## 2020-04-01 DIAGNOSIS — R310 Gross hematuria: Secondary | ICD-10-CM | POA: Diagnosis not present

## 2020-04-01 DIAGNOSIS — R3915 Urgency of urination: Secondary | ICD-10-CM

## 2020-04-01 LAB — POCT URINALYSIS DIPSTICK
Bilirubin, UA: NEGATIVE
Blood, UA: NEGATIVE
Glucose, UA: NEGATIVE
Ketones, UA: NEGATIVE
Leukocytes, UA: NEGATIVE
Nitrite, UA: NEGATIVE
Protein, UA: NEGATIVE
Urobilinogen, UA: 0.2 E.U./dL
pH, UA: 6 (ref 5.0–8.0)

## 2020-04-01 NOTE — Progress Notes (Signed)

## 2020-04-04 ENCOUNTER — Other Ambulatory Visit: Payer: Self-pay

## 2020-04-04 ENCOUNTER — Other Ambulatory Visit: Payer: Self-pay | Admitting: Family Medicine

## 2020-04-04 DIAGNOSIS — R310 Gross hematuria: Secondary | ICD-10-CM

## 2020-04-04 DIAGNOSIS — F332 Major depressive disorder, recurrent severe without psychotic features: Secondary | ICD-10-CM

## 2020-04-09 NOTE — Progress Notes (Signed)
Referring Provider: Mato Brooklyn, FNP Primary Care Physician:  Fuentes Brooklyn, FNP Primary Gastroenterologist:  Dr. Abbey Chatters  Chief Complaint  Patient presents with  . Blood In Stools    comes/goes x 1 month. None recently  . history polyps    TCS last done in [place]    HPI:   Regina Richardson is a 59 y.o. female presenting today at the request of Watts Brooklyn, FNP for history of colon polyps, change in bowel movements, and rectal bleeding.  Patient saw [facility] in January 2019 for abnormal CT scan, GERD, epigastric pain, and anemia.  She had been experiencing epigastric pain radiating to the LUQ x1 week, trouble with gas and reflux for a few months.  Also noted constipation.  No bright red blood but noted the stools were dark.  She had been taking meloxicam regularly.  She was also on omeprazole 20 mg daily.  Her hemoglobin was 11.6.  H. pylori breath test positive.  CT in January 2019 with cecal soft tissue attenuation with question of mass versus stool, also with fatty appearing ileocecal valve.  On exam, her epigastric pain seem to be musculoskeletal with tenderness over the xiphoid process and costal margins.  She was continued on omeprazole 20 mg daily, advised to avoid NSAIDs, and scheduled for EGD and colonoscopy.  Labs were also ordered.  Her hemoglobin had trended down to 10.4 with normocytic indices.  Iron 52, saturation 17% (L), ferritin 220.  Patient had colonoscopy in February 2019 with Dr. Fuller Plan with [facility] for abnormal cecum on CT.  Findings included prominent and moderately lipomatous ileocecal valve, 9 mm sessile polyp in hepatic flexure, 8 mm sessile polyp in the descending colon, otherwise normal exam.  Pathology with sessile serrated polyp and hyperplastic polyp.  Recommended repeat colonoscopy in 5 years.  EGD at the same time as colonoscopy in February 2019 for anemia and epigastric abdominal pain.  She was found to have LA grade B esophagitis without bleeding  s/p biopsied, diffuse moderate inflammation and granularity in the entire examined stomach s/p biopsied, normal examined duodenum s/p biopsy. Gastric biopsy with chronic inactive gastritis without H. pylori, esophageal biopsy with mildly inflamed squamous mucosa, duodenal biopsy benign.  Reviewed recent labs 03/08/20: Hemoglobin 12.4, platelets 304, AST 89 (H), ALT 38 (H), alk phos 205 (H).  03/24/20: GGT 870 (H), acute hepatitis panel negative. Patient was advised to stop drinking alcohol.   Today:  Blood in stool started 1 month ago. Bright red. This occurred intermittently for about 3 weeks and resolved.  No bright red blood per rectum at this time.  Blood was in toilet water, on stool, and on toilet tissue. This was the first time this has ever happened. Reports having hemorrhoids with hemorrhoid surgery in the past. She did not have rectal bleeding but hemorrhoidectomy was performed due to rectal pain.  Notes hemorrhoids were on the outside.  She is not currently having any rectal pain, burning, or itching.  No melena.   BMs daily. Maybe 2 BMs a day.  This is her baseline.  No hard stools. No diarrhea. Stools can be soft to mushy at times.     Stinging in the mid lower abdomen/pelvic are when urinating.  Also with blood in her urine.  She is seeing urology.  There are plans for a CT.  Elevated LFTs: Drinking 24, 12 ounce beer a week. This is her baseline. No heavier drinking in the past. Feels her abdomen is  swollen and tight. Noticed this about 3 years ago. Has been trying to lose weight. States she keeps "swelling up." No confusion. Easy bruising. No yellowing of the eyes. Tried Cocaine and marijuana in the 57s and 70s.  No abdominal pain.  Does not think she was ever treated for H. pylori. Has GERD. Taking omeprazole daily. GERD symptoms continue daily. Has never been on anything other tahn omeprazole.  Also reports year history of solid food and pill dysphagia occurring daily.  Feels items get  hung at the sternal notch. Has had to regurgitate items. No regular nausea or vomiting unless her food is stuck and she has to regurgitate.    Has been going through a divorce. Son came to visit and isn't leaving.   Taking meloxicam for inflammation for joint swelling and arthritis. Will take a 325 mg aspirin as needed: 4-5 times a week.  No OTC supplements.  Past Medical History:  Diagnosis Date  . Alcoholism ([place])   . Anxiety   . Arrhythmia    heart  . Arthritis   . Cancer ([place])    cervical cancer in the 80s  . Depression   . GERD (gastroesophageal reflux disease)   . Hypertension   . Osteoporosis   . Sleep apnea    no CPAP  . Urinary incontinence     Past Surgical History:  Procedure Laterality Date  . ABDOMINAL HYSTERECTOMY     still has ovaries  . CERVICAL BIOPSY  W/ LOOP ELECTRODE EXCISION    . CESAREAN SECTION    . COLONOSCOPY  08/2017   Dr. Fuller Plan;  prominent and moderately lipomatous ileocecal valve, 9 mm sessile polyp in hepatic flexure, 8 mm sessile polyp in the descending colon, otherwise normal exam.  Pathology with sessile serrated polyp and hyperplastic polyp.  Recommended repeat colonoscopy in 5 years.  . COLPOSCOPY    . ESOPHAGOGASTRODUODENOSCOPY  08/2017   Dr. Fuller Plan; LA grade B esophagitis without bleeding s/p biopsied, diffuse moderate inflammation and granularity in the entire examined stomach s/p biopsied, normal examined duodenum s/p biopsy. Gastric biopsy with chronic inactive gastritis without H. pylori, esophageal biopsy with mildly inflamed squamous mucosa, duodenal biopsy benign.  [facility] HEMORRHOID SURGERY    . left collar bone surgery     had fx, pin placed and then complitcations caused them to remove most of the bone  . left forearm surgery     from left elbow down    Current Outpatient Medications  Medication Sig Dispense Refill  . allopurinol (ZYLOPRIM) 300 MG tablet Take 1 tablet (300 mg total) by mouth daily. 90 tablet 1  . clonazePAM  (KLONOPIN) 2 MG tablet Take 2 mg by mouth at bedtime.     . cyclobenzaprine (FLEXERIL) 10 MG tablet TAKE 1TAB 3 TIMES A DAY AS NEEDED FOR MUSCLE SPASMS. IF CAUSING DROWSINESS TAKE 1TAB AT BEDTIME ONLY 30 tablet 5  . DULoxetine (CYMBALTA) 60 MG capsule Take 60 mg by mouth daily.    . hydrOXYzine (ATARAX/VISTARIL) 25 MG tablet Take 25 mg by mouth 3 (three) times daily as needed.    . meloxicam (MOBIC) 7.5 MG tablet TAKE 1 TABLET BY MOUTH EVERY DAY WITH BREAKFAST 30 tablet 2  . metoprolol succinate (TOPROL-XL) 50 MG 24 hr tablet TAKE 1 TABLET BY MOUTH EVERY DAY WITH OR IMMEDIATELY FOLLOWING A MEAL 90 tablet 1  . pantoprazole (PROTONIX) 40 MG tablet Take 1 tablet (40 mg total) by mouth daily before breakfast. 30 tablet 5   No current  facility-administered medications for this visit.    Allergies as of 04/11/2020  . (No Known Allergies)    Family History  Problem Relation Age of Onset  . Alcohol abuse Mother   . Liver disease Mother        alcoholic cirrhosis  . Heart disease Father   . Alcohol abuse Father   . Lung cancer Father   . Diabetes Sister   . Cirrhosis Brother        secondary to alcohol.   . Liver disease Sister        Alcoholic cirrhosis  . Diabetes Maternal Aunt   . Kidney disease Maternal Aunt   . Diabetes Cousin        mat cousin  . Breast cancer Neg Hx   . Colon cancer Neg Hx   . Esophageal cancer Neg Hx   . Stomach cancer Neg Hx   . Rectal cancer Neg Hx     Social History   Socioeconomic History  . Marital status: Married    Spouse name: Not on file  . Number of children: 1  . Years of education: Not on file  . Highest education level: Not on file  Occupational History  . Occupation: retired  Tobacco Use  . Smoking status: Current Every Day Smoker    Packs/day: 1.50    Years: 40.00    Pack years: 60.00    Types: Cigarettes  . Smokeless tobacco: Never Used  Vaping Use  . Vaping Use: Never used  Substance and Sexual Activity  . Alcohol use: Yes     Alcohol/week: 24.0 standard drinks    Types: 24 Cans of beer per week    Comment: 24, 12 ounce beer per week  . Drug use: No    Comment: Tried cocaine and marijuana in the 60s.   . Sexual activity: Not Currently    Birth control/protection: None  Other Topics Concern  . Not on file  Social History Narrative  . Not on file   Social Determinants of Health   Financial Resource Strain:   . Difficulty of Paying Living Expenses: Not on file  Food Insecurity:   . Worried About Charity fundraiser in the Last Year: Not on file  . Ran Out of Food in the Last Year: Not on file  Transportation Needs:   . Lack of Transportation (Medical): Not on file  . Lack of Transportation (Non-Medical): Not on file  Physical Activity:   . Days of Exercise per Week: Not on file  . Minutes of Exercise per Session: Not on file  Stress:   . Feeling of Stress : Not on file  Social Connections:   . Frequency of Communication with Friends and Family: Not on file  . Frequency of Social Gatherings with Friends and Family: Not on file  . Attends Religious Services: Not on file  . Active Member of Clubs or Organizations: Not on file  . Attends Archivist Meetings: Not on file  . Marital Status: Not on file  Intimate Partner Violence:   . Fear of Current or Ex-Partner: Not on file  . Emotionally Abused: Not on file  . Physically Abused: Not on file  . Sexually Abused: Not on file    Review of Systems: Gen: Denies any fever, chills, cold or flulike symptoms, lightheadedness, dizziness, presyncope, syncope. CV: Denies chest pain or heart palpitations Resp: Denies shortness of breath or cough.  GI: See HPI GU : See HPI MS: Admits  to joint pain and arthritis. Heme: See HPI  Physical Exam: BP (!) 172/77   Pulse 76   Temp (!) 97.3 F (36.3 C) (Oral)   Ht 5' (1.524 m)   Wt 167 lb 9.6 oz (76 kg)   BMI 32.73 kg/m  General:   Alert and oriented. Pleasant and cooperative. Well-nourished and  well-developed.  Head:  Normocephalic and atraumatic. Eyes:  Without icterus, sclera clear and conjunctiva pink.  Ears:  Normal auditory acuity. Lungs:  Clear to auscultation bilaterally. No wheezes, rales, or rhonchi. No distress.  Heart:  S1, S2 present without murmurs appreciated.  Abdomen:  +BS.  Abdomen is mildly obese but does not appear significantly distended and is soft.  She has mild tenderness palpation in the epigastric area but is primarily tender on the xiphoid process and right costal margin.  No HSM noted. No guarding or rebound. No masses appreciated.  Rectal:  Deferred  Msk:  Symmetrical without gross deformities. Normal posture. Extremities:  Without edema. Neurologic:  Alert and  oriented x4;  grossly normal neurologically. Skin:  Intact without significant lesions or rashes. Psych:  Normal mood and affect.

## 2020-04-11 ENCOUNTER — Telehealth: Payer: Self-pay | Admitting: *Deleted

## 2020-04-11 ENCOUNTER — Other Ambulatory Visit: Payer: Self-pay | Admitting: *Deleted

## 2020-04-11 ENCOUNTER — Encounter: Payer: Self-pay | Admitting: Gastroenterology

## 2020-04-11 ENCOUNTER — Ambulatory Visit: Payer: Medicare HMO | Admitting: Gastroenterology

## 2020-04-11 ENCOUNTER — Other Ambulatory Visit: Payer: Self-pay

## 2020-04-11 VITALS — BP 172/77 | HR 76 | Temp 97.3°F | Ht 60.0 in | Wt 167.6 lb

## 2020-04-11 DIAGNOSIS — K625 Hemorrhage of anus and rectum: Secondary | ICD-10-CM | POA: Diagnosis not present

## 2020-04-11 DIAGNOSIS — R1310 Dysphagia, unspecified: Secondary | ICD-10-CM

## 2020-04-11 DIAGNOSIS — K219 Gastro-esophageal reflux disease without esophagitis: Secondary | ICD-10-CM | POA: Insufficient documentation

## 2020-04-11 DIAGNOSIS — R1013 Epigastric pain: Secondary | ICD-10-CM | POA: Diagnosis not present

## 2020-04-11 DIAGNOSIS — R7989 Other specified abnormal findings of blood chemistry: Secondary | ICD-10-CM | POA: Diagnosis not present

## 2020-04-11 MED ORDER — PANTOPRAZOLE SODIUM 40 MG PO TBEC: 40.0000 mg | DELAYED_RELEASE_TABLET | Freq: Every day | ORAL | 5 refills | Status: DC

## 2020-04-11 NOTE — Assessment & Plan Note (Addendum)
Patient reports few year history of solid food and pill dysphagia.  Symptoms are currently occurring daily with sensation of items hanging at the sternal notch.  She does have to regurgitate items at times.  Also history of GERD that is not adequately controlled on omeprazole 20 mg daily.  EGD in February 2019 with LA grade B esophagitis without bleeding s/p biopsied, diffuse moderate inflammation and granularity in the entire examined stomach s/p biopsied, normal examined duodenum s/p biopsy. Gastric biopsy with chronic inactive gastritis without H. pylori, esophageal biopsy with mildly inflamed squamous mucosa, duodenal biopsy benign.  Suspect uncontrolled GERD is likely causing esophagitis that is contributing to dysphagia.  She may very well have esophageal web, ring, or stricture.  Less likely malignancy.  Plan: Proceed with EGD +/-dilation with propofol with Dr. Abbey Chatters in the near future. The risks, benefits, and alternatives have been discussed with the patient in detail. The patient states understanding and desires to proceed.  ASA III Stop omeprazole and start Protonix 40 mg daily 30 minutes before breakfast. Counseled on GERD diet/lifestyle.  Handout provided. Follow-up after EGD.

## 2020-04-11 NOTE — Telephone Encounter (Signed)
LMOVM to schedule TCS/EGD +/- dil w/ Dr. Abbey Chatters, ASA 3 Patient [place] scheudled for 10/5 at 8:30am, arrive 8:15am, npo midnight

## 2020-04-11 NOTE — Patient Instructions (Addendum)
Please have labs and ultrasound completed.  We will get you scheduled for upper endoscopy with possible dilation of your esophagus and a colonoscopy in the near future with Dr. Abbey Chatters.  Please stop omeprazole and start Protonix 40 mg daily 30 minutes before breakfast.  I have sent a prescription to your pharmacy.  Recommend you avoid NSAID products as much as possible.  This includes meloxicam, aspirin, ibuprofen, Aleve, Advil, Goody powders, naproxen, and anything that says "NSAID" on the package.  These medications can cause inflammation within your GI tract.  You may take Tylenol as needed.  Do not take more than 2000 mg of Tylenol per day.  Follow a GERD diet:  Avoid fried, fatty, greasy, spicy, citrus foods. Avoid caffeine and carbonated beverages. Avoid chocolate. Try eating 4-6 small meals a day rather than 3 large meals. Do not eat within 3 hours of laying down. Prop head of bed up on wood or bricks to create a 6 inch incline.  For elevated liver enzymes: It is very important that you work towards abstinence of alcohol. Recommend 1-2# weight loss per week until ideal body weight through exercise & diet. Low fat/cholesterol diet.   Avoid sweets, sodas, fruit juices, sweetened beverages like tea, etc. Gradually increase exercise from 15 min daily up to 1 hr per day 5 days/week. Do not take more than 2000 mg of Tylenol per 24 hours. Avoid over-the-counter supplements.  For rectal bleeding: Monitor for return of rectal bleeding.  Let me know if this occurs. You may try using Preparation H twice daily for 7 days at a time if rectal bleeding returns for treatment of possible hemorrhoids.  We will plan to see back after procedures.  Do not hesitate to call if you have questions or concerns prior.  Aliene Altes, PA-C [facility]   Food Choices for Gastroesophageal Reflux Disease, Adult When you have gastroesophageal reflux disease (GERD), the foods you eat and  your eating habits are very important. Choosing the right foods can help ease your discomfort. Think about working with a nutrition specialist (dietitian) to help you make good choices. What are tips for following this plan?  Meals  Choose healthy foods that are low in fat, such as fruits, vegetables, whole grains, low-fat dairy products, and lean meat, fish, and poultry.  Eat small meals often instead of 3 large meals a day. Eat your meals slowly, and in a place where you are relaxed. Avoid bending over or lying down until 2-3 hours after eating.  Avoid eating meals 2-3 hours before bed.  Avoid drinking a lot of liquid with meals.  Cook foods using methods other than frying. Bake, grill, or broil food instead.  Avoid or limit: ? Chocolate. ? Peppermint or spearmint. ? Alcohol. ? Pepper. ? Black and decaffeinated coffee. ? Black and decaffeinated tea. ? Bubbly (carbonated) soft drinks. ? Caffeinated energy drinks and soft drinks.  Limit high-fat foods such as: ? Fatty meat or fried foods. ? Whole milk, cream, butter, or ice cream. ? Nuts and nut butters. ? Pastries, donuts, and sweets made with butter or shortening.  Avoid foods that cause symptoms. These foods may be different for everyone. Common foods that cause symptoms include: ? Tomatoes. ? Oranges, lemons, and limes. ? Peppers. ? Spicy food. ? Onions and garlic. ? Vinegar. Lifestyle  Maintain a healthy weight. Ask your doctor what weight is healthy for you. If you need to lose weight, work with your doctor to do so safely.  Exercise  for at least 30 minutes for 5 or more days each week, or as told by your doctor.  Wear loose-fitting clothes.  Do not smoke. If you need help quitting, ask your doctor.  Sleep with the head of your bed higher than your feet. Use a wedge under the mattress or blocks under the bed frame to raise the head of the bed. Summary  When you have gastroesophageal reflux disease (GERD), food  and lifestyle choices are very important in easing your symptoms.  Eat small meals often instead of 3 large meals a day. Eat your meals slowly, and in a place where you are relaxed.  Limit high-fat foods such as fatty meat or fried foods.  Avoid bending over or lying down until 2-3 hours after eating.  Avoid peppermint and spearmint, caffeine, alcohol, and chocolate. This information is not intended to replace advice given to you by your health care provider. Make sure you discuss any questions you have with your health care provider. Document Revised: 10/30/2018 Document Reviewed: 08/14/2016 Elsevier Patient Education  [place].

## 2020-04-11 NOTE — Assessment & Plan Note (Addendum)
59 year old female with new onset painless rectal bleeding that started about 1 month ago.  She reported bright red blood in toilet water, on stool, and on toilet tissue that was intermittent for about 3 weeks.  No current rectal bleeding.  She reports history of hemorrhoids with hemorrhoidectomy in the past.  Denies other typical hemorrhoid symptoms.  Denies change in bowel habits or melena.  Colonoscopy in February 2019 with  prominent and moderately lipomatous ileocecal valve, 9 mm sessile polyp in hepatic flexure, 8 mm sessile polyp in the descending colon, otherwise normal exam.  Pathology with sessile serrated polyp and hyperplastic polyp.  Recommended repeat colonoscopy in 5 years.   As rectal bleeding is new, she is going to need early interval colonoscopy.  Symptoms may be secondary to hemorrhoids.  Cannot rule out colon polyps or malignancy.  Plan: Proceed with colonoscopy with propofol with Dr. Abbey Chatters in the near future. The risks, benefits, and alternatives have been discussed with the patient in detail. The patient states understanding and desires to proceed.  ASA III Advise if she were to have any return of rectal bleeding to let me know. May use Preparation H twice daily for 7 days at a time for empiric treatment of hemorrhoids if rectal bleeding returns. Follow-up after procedure.

## 2020-04-11 NOTE — Assessment & Plan Note (Addendum)
Patient reports history of GERD that is currently uncontrolled on omeprazole 20 mg daily. Also with solid food and pill dysphagia occurring daily for few years.  She had a positive H. pylori breath test in January 2019.  EGD in February 2019 with LA grade B esophagitis without bleeding s/p biopsied, diffuse moderate inflammation and granularity of entire stomach s/p biopsy, normal examined duodenum s/p biopsy.  Esophageal biopsy with mildly inflamed squamous mucosa, gastric biopsy with chronic inactive gastritis without H. pylori, duodenal biopsy benign. Patient reports she was never treated for H. pylori.  Denies abdominal pain but notes feeling "swollen" and "tight".  On exam, her abdomen is obese but soft.  She does have tenderness to palpation in the epigastric area but she seems to be most tender on her xiphoid process and right costal margin. She is currently taking meloxicam daily and 325 mg aspirin 4-5 times a week for headaches.  Suspect uncontrolled GERD is likely secondary to diet and chronic alcohol use.  Not sure what role H pylori may be playing if she does have H. Pylori. She will need EGD with biopsies to help evaluate this one more time as I do not think she will tolerate being off PPI for 2 weeks.   Plan: Stop omeprazole and start Protonix 40 mg daily 30 minutes before breakfast. Counseled on GERD diet/lifestyle.  Handout provided. Advised to work towards abstinence of alcohol. Limit all NSAIDs including meloxicam as much as possible. Proceed with EGD +/-dilation with gastric biopsies with propofol with Dr. Abbey Chatters in the near future. The risks, benefits, and alternatives have been discussed with the patient in detail. The patient states understanding and desires to proceed.  ASA III Follow-up after EGD.

## 2020-04-11 NOTE — Assessment & Plan Note (Addendum)
59 year old female with elevated LFTs dating back to 2018.  Most recent labs in August 2021 with AST 89 (H), ALT 38 (H), alk phos 205 (H).  Follow-up labs 03/24/2020 with GGT 870 (H), acute hepatitis panel negative.  I suspect LFT elevation is secondary to chronic alcohol abuse as she notes drinking a 24 pack of beer weekly.  Fatty liver likely playing a role as well. No significant Tylenol consumption.  No OTC supplements.  Reports trying cocaine and marijuana in the 60s and 70s.  Family history significant for mother, sister, and brother with history of alcoholic cirrhosis.  When discussing signs/symptoms of liver disease with patient, she reports feeling her abdomen is swollen and feels tight.  No swelling in lower extremities.  Also notes easy bruising.  Notably, platelets within normal limits in August 2021.  On exam, her abdomen is mildly obese but soft with no significant distention.  No LE edema.  Plan: Ultrasound abdomen complete. Update HFP. Check an iron panel and INR. Complete hepatitis evaluation with Hepatitis A antibody total, hepatitis B surface antibody, hepatitis B core antibody Advise she work towards abstinence of alcohol. May use Tylenol if needed.  No more than 2000 mg in 24 hours. Avoid OTC supplements. Counseled on fatty liver.  Recommend 1-2# weight loss per week until ideal body weight through exercise & diet.  Low fat/cholesterol diet.    Avoid sweets, sodas, fruit juices, sweetened beverages like tea, etc.  Gradually increase exercise from 15 min daily up to 1 hr per day 5 days/week.

## 2020-04-12 ENCOUNTER — Telehealth: Payer: Self-pay | Admitting: Internal Medicine

## 2020-04-12 ENCOUNTER — Encounter: Payer: Self-pay | Admitting: *Deleted

## 2020-04-12 NOTE — Telephone Encounter (Signed)
PLEASE CALL PATIENT WHEN YOU CAN, KNOWS SHE SPOKE TO YOU THIS MORNING BUT SAID SHE HAD JUST WOKEN UP AND CAN NOT REMEMBER WHAT YOU SAID.

## 2020-04-12 NOTE — Telephone Encounter (Signed)
Spoke with pt. She is aware of [place] appt details. She has also been scheduled for procedure 11/23 at 8:45am. Aware will mail prep instructions with her pre-op/covid test appt. Confirmed mailing   PA for TCS/EGD/DIL approved via [place]. Auth# [id number] dates 06/14/20-07/14/2020

## 2020-04-12 NOTE — Telephone Encounter (Signed)
Called pt. Questions answered

## 2020-04-15 ENCOUNTER — Other Ambulatory Visit: Payer: Self-pay | Admitting: Family Medicine

## 2020-04-15 ENCOUNTER — Other Ambulatory Visit (HOSPITAL_COMMUNITY): Payer: Self-pay | Admitting: Psychiatry

## 2020-04-20 ENCOUNTER — Other Ambulatory Visit: Payer: Self-pay | Admitting: Family Medicine

## 2020-04-22 DIAGNOSIS — K7460 Unspecified cirrhosis of liver: Secondary | ICD-10-CM

## 2020-04-22 HISTORY — DX: Unspecified cirrhosis of liver: K74.60

## 2020-04-26 ENCOUNTER — Telehealth: Payer: Self-pay

## 2020-04-26 ENCOUNTER — Other Ambulatory Visit: Payer: Self-pay

## 2020-04-26 ENCOUNTER — Other Ambulatory Visit: Payer: Self-pay | Admitting: Gastroenterology

## 2020-04-26 ENCOUNTER — Ambulatory Visit (HOSPITAL_COMMUNITY)
Admission: RE | Admit: 2020-04-26 | Discharge: 2020-04-26 | Disposition: A | Discharge status: Home / Self Care | Payer: Medicare HMO | Source: Ambulatory Visit | Attending: Urology | Admitting: Urology

## 2020-04-26 ENCOUNTER — Ambulatory Visit (HOSPITAL_COMMUNITY)
Admission: RE | Admit: 2020-04-26 | Discharge: 2020-04-26 | Disposition: A | Discharge status: Home / Self Care | Payer: Medicare HMO | Source: Ambulatory Visit | Attending: Gastroenterology | Admitting: Gastroenterology

## 2020-04-26 DIAGNOSIS — R310 Gross hematuria: Secondary | ICD-10-CM | POA: Diagnosis not present

## 2020-04-26 DIAGNOSIS — R7989 Other specified abnormal findings of blood chemistry: Secondary | ICD-10-CM | POA: Insufficient documentation

## 2020-04-26 DIAGNOSIS — K7460 Unspecified cirrhosis of liver: Secondary | ICD-10-CM | POA: Diagnosis not present

## 2020-04-26 LAB — POCT I-STAT CREATININE: Creatinine, Ser: 0.9 mg/dL (ref 0.44–1.00)

## 2020-04-26 MED ORDER — IOHEXOL 300 MG/ML  SOLN
150.0000 mL | Freq: Once | INTRAMUSCULAR | Status: AC | PRN
  Administered 2020-04-26: 125 mL via INTRAVENOUS

## 2020-04-26 NOTE — Telephone Encounter (Signed)
Pt made aware of her ct results per Dr. Jeffie Pollock will keep schedule OV for cysto.

## 2020-04-26 NOTE — Progress Notes (Signed)
US reveals patient has cirrhosis.  I suspect this is likely secondary to chronic alcohol use. However, she will need to complete labs I had ordered at her office visit to help evaluate for other causes.  I would also like to add a few additional labs for her to have completed as well.    She also has sludge in her gallbladder but no gallstones or evidence of inflammation.  She denies abdominal pain at her OV and only had tenderness in the epigastric area and along her costal margin. I do not think gallbladder sludge is the cause of this.   Recommendations:  1.  Complete labs I originally placed at her office visit.2.  Please add on antimitochondrial antibody, antinuclear antibody, smooth muscle antibody, immunoglobulins (IgA, IgM, IgG), and AFP to labs.  3.  She will need to work towards complete abstinence of alcohol as we discussed at her office visit.4.  Follow a low-sodium diet.  No more than 2000 mg per 24 hours.5.  May use Tylenol as needed.  No more than 2000 mg per 24 hours. 6.  She did not have any free fluid accumulation in her abdomen. She should monitor for worsening swelling in her abdomen, swelling in her lower extremities, confusion/changes in mental status, yellowing of her eyes or skin, or bright red blood per rectum, black stools, or bloody emesis and let us know if this occurs. 7.  We will follow up with her in the office after her procedures.  She should call with any questions or concerns prior to her office visit.

## 2020-04-27 ENCOUNTER — Other Ambulatory Visit: Payer: Self-pay | Admitting: *Deleted

## 2020-04-27 ENCOUNTER — Telehealth: Payer: Self-pay

## 2020-04-27 DIAGNOSIS — R1013 Epigastric pain: Secondary | ICD-10-CM

## 2020-04-27 DIAGNOSIS — K219 Gastro-esophageal reflux disease without esophagitis: Secondary | ICD-10-CM

## 2020-04-27 DIAGNOSIS — K625 Hemorrhage of anus and rectum: Secondary | ICD-10-CM

## 2020-04-27 DIAGNOSIS — R7989 Other specified abnormal findings of blood chemistry: Secondary | ICD-10-CM

## 2020-04-27 DIAGNOSIS — R1310 Dysphagia, unspecified: Secondary | ICD-10-CM

## 2020-04-27 LAB — IRON AND TIBC
Iron Saturation: 26 % (ref 15–55)
Iron: 63 ug/dL (ref 27–159)
Total Iron Binding Capacity: 245 ug/dL — ABNORMAL LOW (ref 250–450)
UIBC: 182 ug/dL (ref 131–425)

## 2020-04-27 LAB — HEPATIC FUNCTION PANEL
ALT: 48 IU/L — ABNORMAL HIGH (ref 0–32)
AST: 101 IU/L — ABNORMAL HIGH (ref 0–40)
Albumin: 4.1 g/dL (ref 3.8–4.9)
Alkaline Phosphatase: 325 IU/L — ABNORMAL HIGH (ref 44–121)
Bilirubin Total: 0.6 mg/dL (ref 0.0–1.2)
Bilirubin, Direct: 0.34 mg/dL (ref 0.00–0.40)
Total Protein: 8.2 g/dL (ref 6.0–8.5)

## 2020-04-27 LAB — SPECIMEN STATUS REPORT

## 2020-04-27 LAB — HEPATITIS B SURFACE ANTIBODY,QUALITATIVE: Hep B Surface Ab, Qual: NONREACTIVE

## 2020-04-27 LAB — HEPATITIS A ANTIBODY, TOTAL: hep A Total Ab: NEGATIVE

## 2020-04-27 LAB — HEPATITIS B CORE AB W/REFLEX: Hep B Core Total Ab: NEGATIVE

## 2020-04-27 LAB — FERRITIN: Ferritin: 250 ng/mL — ABNORMAL HIGH (ref 15–150)

## 2020-04-27 LAB — PROTIME-INR
INR: 1.1 (ref 0.9–1.2)
Prothrombin Time: 11.2 s (ref 9.1–12.0)

## 2020-04-27 NOTE — Telephone Encounter (Signed)
Noted  

## 2020-04-27 NOTE — Telephone Encounter (Signed)
Noted. Will review within the next 3-5 business days.  

## 2020-04-27 NOTE — Telephone Encounter (Signed)
Lab results received from [place]. Results were placed in [place], [place] desk.

## 2020-04-27 NOTE — Telephone Encounter (Signed)
Reviewed labs collected 04/26/2020.  HFP: Total protein 8.2, albumin 4.1, total bilirubin 0.6, direct bilirubin 0.34, alk phos 325 (H), AST 101 (H), ALT 48 (H). Iron panel: Iron 63, iron saturation 26%, TIBC 245 (L), ferritin 250 (H) INR 1.1 Hepatitis B surface antibody nonreactive Hepatitis B core antibody negative  Hepatitis A antibody nonreactive  With addition of labs completed in August, MELD 7.   [facility] Nurse: Please let patient know I have received and reviewed her labs completed 04/26/2020.  Her LFTs continue to rise.  She has no immunity to hepatitis A or B. Ferritin which represents her iron stores is also high.  This can be elevated in the setting of chronic alcohol use.  However, in the setting of cirrhosis, would also like to rule out a condition called hemochromatosis.  Recommendations:  1. I do not mean to sound like a broken record, but with LFTs continuing to rise, it is imperative she stop drinking alcohol as she can end up with alcoholic hepatitis which can be life threatening. Is this something she thinks she will be able to do herself?  2. We will complete additional labs that I had already recommended (see [place] result).  3. Add on lab for hemochromatosis DNA to additional lab orders that have already been placed.  4. She needs vaccination to Hep A and B. Please provide Rx for this.

## 2020-04-28 ENCOUNTER — Telehealth: Payer: Self-pay | Admitting: Internal Medicine

## 2020-04-28 ENCOUNTER — Other Ambulatory Visit: Payer: Self-pay

## 2020-04-28 NOTE — Telephone Encounter (Signed)
Spoke with pt. Tried to discuss results and recommendations with pt. Pt stated she hasn't had any sleep in 1-2 days, pt voiced that she can't sleep without drinking Alcohol and she hasn't had any alcoholic beverages since she was given her previous results. Pt has an apt for a mental health evaluation, an appointment to discuss stopping drinking, an appointment with her PCP and an appointment with a neurologist on 05/20/20. I will fax all lab orders needed to [facility] and mail Hep A/B vaccination order to pt. I will call pt again in a few days to discuss results again to make sure pt understands what was said on our 04/28/20 phone call. Spoke with Aliene Altes PA and she is aware of this.

## 2020-04-28 NOTE — Telephone Encounter (Signed)
Patient called asking if her lab order is at the lab

## 2020-04-28 NOTE — Telephone Encounter (Signed)
Spoke with pt. See other phone note.

## 2020-05-03 ENCOUNTER — Encounter: Payer: Medicare HMO | Admitting: Family Medicine

## 2020-05-03 ENCOUNTER — Telehealth: Payer: Self-pay

## 2020-05-03 ENCOUNTER — Encounter: Payer: Self-pay | Admitting: Family Medicine

## 2020-05-03 NOTE — Telephone Encounter (Signed)
Attempted to contact patient - NA °

## 2020-05-03 NOTE — Telephone Encounter (Signed)
Lmtcb.

## 2020-05-03 NOTE — Telephone Encounter (Signed)
Patient states that she did not make the appointment with you this morning.  States she would like to get help to stop drinking.  Requested a tele phone visit - tele visit appointment scheduled Friday with you for 30 mins.   Let me know if this needs to be IN OFFICE.  Advised patient to come in but she states that there was too much going on with her brother passing away that it would be better on the phone.

## 2020-05-03 NOTE — Progress Notes (Signed)
After multiple attempts to reach patient unsuccessfully I am closing the chart.

## 2020-05-06 ENCOUNTER — Ambulatory Visit: Payer: Medicare HMO | Admitting: Family Medicine

## 2020-05-10 ENCOUNTER — Ambulatory Visit (INDEPENDENT_AMBULATORY_CARE_PROVIDER_SITE_OTHER): Payer: Medicare HMO | Admitting: Family Medicine

## 2020-05-10 ENCOUNTER — Other Ambulatory Visit: Payer: Self-pay

## 2020-05-10 DIAGNOSIS — Z23 Encounter for immunization: Secondary | ICD-10-CM | POA: Diagnosis not present

## 2020-05-10 NOTE — Addendum Note (Signed)
Addended by: Brynda Peon F on: 05/10/2020 02:16 PM   Modules accepted: Orders

## 2020-05-13 ENCOUNTER — Encounter: Payer: Self-pay | Admitting: Nurse Practitioner

## 2020-05-13 ENCOUNTER — Ambulatory Visit (INDEPENDENT_AMBULATORY_CARE_PROVIDER_SITE_OTHER): Payer: Medicare HMO | Admitting: Nurse Practitioner

## 2020-05-13 ENCOUNTER — Telehealth: Payer: Self-pay | Admitting: Family Medicine

## 2020-05-13 DIAGNOSIS — F419 Anxiety disorder, unspecified: Secondary | ICD-10-CM

## 2020-05-13 DIAGNOSIS — F32A Depression, unspecified: Secondary | ICD-10-CM

## 2020-05-13 DIAGNOSIS — F1010 Alcohol abuse, uncomplicated: Secondary | ICD-10-CM | POA: Diagnosis not present

## 2020-05-13 NOTE — Assessment & Plan Note (Signed)
Anxiety and depression is not well controlled.  Patient is currently on Wellbutrin SR 150 mg tablet every 12 hours, and Klonopin. Provided education to patient with printed handouts given.  Encourage patient to use cognitive behavioral therapy.  Patient verbalized understanding and is working on going to [facility].  Advised patient to follow-up with worsening or unresolved symptoms.

## 2020-05-13 NOTE — Assessment & Plan Note (Signed)
Patient alcohol abuse is not well controlled.  Patient is reporting increased alcohol abuse.  Drinking 6-7 containers of beer a day.  Evaluated patient advised patient to wean off alcohol in 2 weeks, using Wellbutrin to reduce craving.  Cognitive behavioral therapy.  In order to avoid seizures from discontinuing alcohol abruptly.  Patient is reporting that she has tried everything but not making good progress. Completed referral to [facility] health.  Provided education to patient with printed handouts given. Follow-up with worsening or unresolved symptoms.

## 2020-05-13 NOTE — Telephone Encounter (Signed)
Patient called in to confirm she needed to get labs done on 10/29

## 2020-05-13 NOTE — Progress Notes (Signed)
Virtual Visit via telephone Note Due to COVID-19 pandemic this visit was conducted virtually. This visit type was conducted due to national recommendations for restrictions regarding the COVID-19 Pandemic (e.g. social distancing, sheltering in place) in an effort to limit this patient's exposure and mitigate transmission in our community. All issues noted in this document were discussed and addressed.  A physical exam was not performed with this format.  I connected with Regina Richardson on 05/13/20 at 10:40 am by telephone and verified that I am speaking with the correct person using two identifiers. Regina Richardson is currently located at home and noone is currently with patient during visit. The provider, Ivy Lynn, NP is located in their office at time of visit.  I discussed the limitations, risks, security and privacy concerns of performing an evaluation and management service by telephone and the availability of in person appointments. I also discussed with the patient that there may be a patient responsible charge related to this service. The patient expressed understanding and agreed to proceed.   History and Present Illness:  HPI  Patient is a 59 year old female who is seen via tele for alcohol abuse.  Patient is requesting help.  Patient is reporting lately that her family is complaining that she has become mean, and aggressive.  Patient reports she is helpless and has no control of her situation.  She reports she is currently drinking 6-7 containers of beer a day.  She is reporting that her anxiety is worse and she is unable to focus or concentrate.  Patient is willing to go to [facility] mental health clinic but will need a referral.  Review of Systems  Psychiatric/Behavioral: Positive for depression and substance abuse. Negative for suicidal ideas. The patient is nervous/anxious.   All other systems reviewed and are  negative.    Observations/Objective: Tele-visit  Assessment and Plan: Alcohol abuse Patient alcohol abuse is not well controlled.  Patient is reporting increased alcohol abuse.  Drinking 6-7 containers of beer a day.  Evaluated patient advised patient to wean off alcohol in 2 weeks, using Wellbutrin to reduce craving.  Cognitive behavioral therapy.  In order to avoid seizures from discontinuing alcohol abruptly.  Patient is reporting that she has tried everything but not making good progress. Completed referral to [facility] health.  Provided education to patient with printed handouts given. Follow-up with worsening or unresolved symptoms.  Anxiety and depression Anxiety and depression is not well controlled.  Patient is currently on Wellbutrin SR 150 mg tablet every 12 hours, and Klonopin. Provided education to patient with printed handouts given.  Encourage patient to use cognitive behavioral therapy.  Patient verbalized understanding and is working on going to [facility].  Advised patient to follow-up with worsening or unresolved symptoms.     Follow Up Instructions:  With worsening or unresolved symptoms.  I discussed the assessment and treatment plan with the patient. The patient was provided an opportunity to ask questions and all were answered. The patient agreed with the plan and demonstrated an understanding of the instructions.   The patient was advised to call back or seek an in-person evaluation if the symptoms worsen or if the condition fails to improve as anticipated.  The above assessment and management plan was discussed with the patient. The patient verbalized understanding of and has agreed to the management plan. Patient is aware to call the clinic if symptoms persist or worsen. Patient is aware when to return to  the clinic for a follow-up visit. Patient educated on when it is appropriate to go to the emergency department.   Time call  ended:    I provided 15 minutes of non-face-to-face time during this encounter.    Ivy Lynn, NP

## 2020-05-13 NOTE — Telephone Encounter (Signed)
Reviewed   Jac Canavan

## 2020-05-17 ENCOUNTER — Telehealth: Payer: Self-pay | Admitting: Internal Medicine

## 2020-05-17 ENCOUNTER — Telehealth: Payer: Self-pay

## 2020-05-17 NOTE — Telephone Encounter (Signed)
Am not sure what else to do?  She can try [facility] ( [employer] [facility]), they wont turn her away, ED for alcohol withdraw and a hospital admission could help win her off. These are only suggestions

## 2020-05-17 NOTE — Telephone Encounter (Signed)
Mr. Sunday Corn from [facility] called to let us know that their office is not contracted with patient's Regina Richardson insurance and therefore are unable to take her on as a patient.  Patient is aware and is very upset as she needs some help with her drinking and is mandated by court to seek help.    Patient is requesting someone to give her another option to go and seek help.

## 2020-05-17 NOTE — Telephone Encounter (Signed)
Pt called and wanted to make sure her lab orders were sent to [facility] because she is going Friday.

## 2020-05-17 NOTE — Telephone Encounter (Signed)
Spoke with patient advised that lab form sent to [facility]

## 2020-05-19 NOTE — Progress Notes (Signed)
Subjective: 1. Gross hematuria     05/20/20:  Regina Richardson returns today in f/u for cystoscopy.  She has had no further hematuria.  The CT showed no obvious cause of the the bleeding but she had findings suggestive of inflammatory bowel disease, hepatomegaly and steatosis and atherosclerosis.   Her UA today is clear.  She is currently being evaluated by GI.   GU Hx: Regina Richardson is a 59 yo female who is sent by Hendricks Limes NP for gross hematuria over the last 2-3 weeks.  She had heavy bleeding with clots but that has cleared.  She has also had bloody stools and is to see a GI.  She has had some lower abdominal pain.  She has nocturia 5-6x.  Regina Richardson has daytime frequency.  She has urgency and some UUI and SUI.  She has no history of stones or UTI's.  She has had no GU surgery.   The blood in the stool has cleared.   She had labs on 8/17 and her Cr was 0.65.   I reviewed her CT films and report from 1/19 that showed no GU abnormalities.  She had a GI finding and colonoscopy demonstrated polyps which were removed.   She has a history of cervical cancer that was treated with conization and subsequently she has had a hysterectomy.  She is a smoker.  She is a drinker and has some liver dysfunction.  ROS:  ROS  No Known Allergies  Past Medical History:  Diagnosis Date  . Alcoholism ([place])   . Anxiety   . Arrhythmia    heart  . Arthritis   . Cancer ([place])    cervical cancer in the 80s  . Depression   . GERD (gastroesophageal reflux disease)   . Hypertension   . Osteoporosis   . Sleep apnea    no CPAP  . Urinary incontinence     Past Surgical History:  Procedure Laterality Date  . ABDOMINAL HYSTERECTOMY     still has ovaries  . CERVICAL BIOPSY  W/ LOOP ELECTRODE EXCISION    . CESAREAN SECTION    . COLONOSCOPY  08/2017   Dr. Fuller Plan;  prominent and moderately lipomatous ileocecal valve, 9 mm sessile polyp in hepatic flexure, 8 mm sessile polyp in the descending colon, otherwise normal exam.  Pathology with  sessile serrated polyp and hyperplastic polyp.  Recommended repeat colonoscopy in 5 years.  . COLPOSCOPY    . ESOPHAGOGASTRODUODENOSCOPY  08/2017   Dr. Fuller Plan; LA grade B esophagitis without bleeding s/p biopsied, diffuse moderate inflammation and granularity in the entire examined stomach s/p biopsied, normal examined duodenum s/p biopsy. Gastric biopsy with chronic inactive gastritis without H. pylori, esophageal biopsy with mildly inflamed squamous mucosa, duodenal biopsy benign.  Marland Kitchen HEMORRHOID SURGERY    . left collar bone surgery     had fx, pin placed and then complitcations caused them to remove most of the bone  . left forearm surgery     from left elbow down    Social History   Socioeconomic History  . Marital status: Married    Spouse name: Not on file  . Number of children: 1  . Years of education: Not on file  . Highest education level: Not on file  Occupational History  . Occupation: retired  Tobacco Use  . Smoking status: Current Every Day Smoker    Packs/day: 1.50    Years: 40.00    Pack years: 60.00    Types: Cigarettes  . Smokeless tobacco:  Never Used  Vaping Use  . Vaping Use: Never used  Substance and Sexual Activity  . Alcohol use: Yes    Alcohol/week: 24.0 standard drinks    Types: 24 Cans of beer per week    Comment: 24, 12 ounce beer per week  . Drug use: No    Comment: Tried cocaine and marijuana in the 60s.   . Sexual activity: Not Currently    Birth control/protection: None  Other Topics Concern  . Not on file  Social History Narrative  . Not on file   Social Determinants of Health   Financial Resource Strain:   . Difficulty of Paying Living Expenses: Not on file  Food Insecurity:   . Worried About Charity fundraiser in the Last Year: Not on file  . Ran Out of Food in the Last Year: Not on file  Transportation Needs:   . Lack of Transportation (Medical): Not on file  . Lack of Transportation (Non-Medical): Not on file  Physical Activity:    . Days of Exercise per Week: Not on file  . Minutes of Exercise per Session: Not on file  Stress:   . Feeling of Stress : Not on file  Social Connections:   . Frequency of Communication with Friends and Family: Not on file  . Frequency of Social Gatherings with Friends and Family: Not on file  . Attends Religious Services: Not on file  . Active Member of Clubs or Organizations: Not on file  . Attends Archivist Meetings: Not on file  . Marital Status: Not on file  Intimate Partner Violence:   . Fear of Current or Ex-Partner: Not on file  . Emotionally Abused: Not on file  . Physically Abused: Not on file  . Sexually Abused: Not on file    Family History  Problem Relation Age of Onset  . Alcohol abuse Mother   . Liver disease Mother        alcoholic cirrhosis  . Heart disease Father   . Alcohol abuse Father   . Lung cancer Father   . Diabetes Sister   . Cirrhosis Brother        secondary to alcohol.   . Liver disease Sister        Alcoholic cirrhosis  . Diabetes Maternal Aunt   . Kidney disease Maternal Aunt   . Diabetes Cousin        mat cousin  . Breast cancer Neg Hx   . Colon cancer Neg Hx   . Esophageal cancer Neg Hx   . Stomach cancer Neg Hx   . Rectal cancer Neg Hx     Anti-infectives: Anti-infectives (From admission, onward)   None      Current Outpatient Medications  Medication Sig Dispense Refill  . allopurinol (ZYLOPRIM) 300 MG tablet Take 1 tablet (300 mg total) by mouth daily. 90 tablet 1  . buPROPion (WELLBUTRIN SR) 150 MG 12 hr tablet TAKE 1 TABLET BY MOUTH TWICE A DAY 180 tablet 2  . clonazePAM (KLONOPIN) 2 MG tablet Take 2 mg by mouth at bedtime.     . cyclobenzaprine (FLEXERIL) 10 MG tablet TAKE 1TAB 3 TIMES A DAY AS NEEDED FOR MUSCLE SPASMS. IF CAUSING DROWSINESS TAKE 1TAB AT BEDTIME ONLY 30 tablet 5  . DULoxetine (CYMBALTA) 60 MG capsule Take 60 mg by mouth daily.    . hydrOXYzine (ATARAX/VISTARIL) 25 MG tablet Take 25 mg by mouth 3  (three) times daily as needed.    Marland Kitchen  meloxicam (MOBIC) 7.5 MG tablet TAKE 1 TABLET BY MOUTH EVERY DAY WITH BREAKFAST 30 tablet 2  . metoprolol succinate (TOPROL-XL) 50 MG 24 hr tablet TAKE 1 TABLET BY MOUTH EVERY DAY WITH OR IMMEDIATELY FOLLOWING A MEAL 90 tablet 1  . pantoprazole (PROTONIX) 40 MG tablet Take 1 tablet (40 mg total) by mouth daily before breakfast. 30 tablet 5   No current facility-administered medications for this visit.     Objective: Vital signs in last 24 hours: BP 133/75   Pulse 67   Temp 98.3 F (36.8 C)   Ht 5' (1.524 m)   Wt 168 lb (76.2 kg)   BMI 32.81 kg/m   Intake/Output from previous day: No intake/output data recorded. Intake/Output this shift: @IOTHISSHIFT @   Physical Exam  Lab Results:  No results found for this or any previous visit (from the past 24 hour(s)).  BMET No results for input(s): NA, K, CL, CO2, GLUCOSE, BUN, CREATININE, CALCIUM in the last 72 hours. PT/INR No results for input(s): LABPROT, INR in the last 72 hours. ABG No results for input(s): PHART, HCO3 in the last 72 hours.  Invalid input(s): PCO2, PO2  Studies/Results:  CLINICAL DATA:  Gross hematuria  EXAM: CT ABDOMEN AND PELVIS WITHOUT AND WITH CONTRAST  TECHNIQUE: Multidetector CT imaging of the abdomen and pelvis was performed following the standard protocol before and following the bolus administration of intravenous contrast.  CONTRAST:  143mL OMNIPAQUE IOHEXOL 300 MG/ML  SOLN  COMPARISON:  07/31/2017  FINDINGS: Lower chest: No acute abnormality.  Hepatobiliary: No solid liver abnormality is seen. Hepatomegaly and hepatic steatosis. No gallstones, gallbladder wall thickening, or biliary dilatation.  Pancreas: Unremarkable. No pancreatic ductal dilatation or surrounding inflammatory changes.  Spleen: Normal in size without significant abnormality.  Adrenals/Urinary Tract: Stable, definitively benign fat containing left adrenal adenoma. Mild  perinephric fat stranding. No mass, calculi, or hydronephrosis. No urinary tract filling defect on delayed phase imaging. Bladder is unremarkable.  Stomach/Bowel: Stomach is within normal limits. There is fatty mural stratification of the cecal base and appendix as well as the sigmoid colon and rectum (series 7, image 73). Retroperitoneal fat stranding in the right lower quadrant.  Vascular/Lymphatic: Aortic atherosclerosis. No enlarged abdominal or pelvic lymph nodes.  Reproductive: Status post hysterectomy.  Other: No abdominal wall hernia or abnormality. No abdominopelvic ascites.  Musculoskeletal: No acute or significant osseous findings.  IMPRESSION: 1. No CT findings to explain hematuria. No evidence of urinary tract calculus, mass, or hydronephrosis. No urinary tract filling defect on delayed phase imaging. 2. There is fatty mural stratification of the cecal base and appendix as well as the sigmoid colon rectum, this constellation of findings generally suggestive of sequelae of prior. Retroperitoneal fat stranding in the right lower quadrant. Findings are suggestive of chronic sequelae of infectious or inflammatory colitis, particularly inflammatory bowel disease such as Crohn's disease given this appearance. 3. There is inflammatory perinephric as well as right lower quadrant fat stranding, of uncertain etiology or acuity but new compared to prior examination dated 07/31/2017. 4. Hepatomegaly and hepatic steatosis. 5. Aortic Atherosclerosis (ICD10-I70.0).   Electronically Signed   By: Eddie Candle M.D.   On: 04/26/2020 09:56  Procedure:  Cystoscopy.  She was prepped with Betadine.  Cystoscopy was performed with the flexible scope.  The urethra was normal.   There was mild trabeculation with normal mucosa and the UO's were normal.  There were no complications.   Assessment/Plan: Gross hematuria.   Hematuria w/u is negative.  She  will f/u prn.   No orders  of the defined types were placed in this encounter.    Orders Placed This Encounter  Procedures  . Urinalysis, Routine w reflex microscopic     Return if symptoms worsen or fail to improve.    CC: Hendricks Limes NP.      Irine Seal 05/20/2020 [phone]

## 2020-05-20 ENCOUNTER — Ambulatory Visit (INDEPENDENT_AMBULATORY_CARE_PROVIDER_SITE_OTHER): Payer: Medicare HMO | Admitting: Urology

## 2020-05-20 ENCOUNTER — Other Ambulatory Visit: Payer: Self-pay

## 2020-05-20 ENCOUNTER — Encounter: Payer: Self-pay | Admitting: Urology

## 2020-05-20 ENCOUNTER — Ambulatory Visit: Payer: Medicare HMO | Admitting: Family Medicine

## 2020-05-20 VITALS — BP 133/75 | HR 67 | Temp 98.3°F | Ht 60.0 in | Wt 168.0 lb

## 2020-05-20 DIAGNOSIS — R1310 Dysphagia, unspecified: Secondary | ICD-10-CM | POA: Diagnosis not present

## 2020-05-20 DIAGNOSIS — K219 Gastro-esophageal reflux disease without esophagitis: Secondary | ICD-10-CM | POA: Diagnosis not present

## 2020-05-20 DIAGNOSIS — K625 Hemorrhage of anus and rectum: Secondary | ICD-10-CM | POA: Diagnosis not present

## 2020-05-20 DIAGNOSIS — R1013 Epigastric pain: Secondary | ICD-10-CM | POA: Diagnosis not present

## 2020-05-20 DIAGNOSIS — R310 Gross hematuria: Secondary | ICD-10-CM

## 2020-05-20 DIAGNOSIS — R7989 Other specified abnormal findings of blood chemistry: Secondary | ICD-10-CM | POA: Diagnosis not present

## 2020-05-20 LAB — URINALYSIS, ROUTINE W REFLEX MICROSCOPIC
Bilirubin, UA: NEGATIVE
Glucose, UA: NEGATIVE
Ketones, UA: NEGATIVE
Leukocytes,UA: NEGATIVE
Nitrite, UA: NEGATIVE
Protein,UA: NEGATIVE
RBC, UA: NEGATIVE
Specific Gravity, UA: <1.005 — ABNORMAL LOW (ref 1.005–1.030)
Urobilinogen, Ur: 0.2 mg/dL (ref 0.2–1.0)
pH, UA: 6 (ref 5.0–7.5)

## 2020-05-20 NOTE — Progress Notes (Signed)
Urological Symptom Review  Patient is experiencing the following symptoms: Frequent urination Burning/pain with urination Get up at night to urinate Leakage of urine Blood in urine   Review of Systems  Gastrointestinal (upper)  : Nausea Vomiting Indigestion/heartburn  Gastrointestinal (lower) : Negative for lower GI symptoms  Constitutional : Negative for symptoms  Skin: Negative for skin symptoms  Eyes: Blurred vision Double vision  Ear/Nose/Throat : Negative for Ear/Nose/Throat symptoms  Hematologic/Lymphatic: Negative for Hematologic/Lymphatic symptoms  Cardiovascular : Leg swelling Chest pain  Respiratory : Cough Shortness of breath  Endocrine: Negative for endocrine symptoms  Musculoskeletal: Back pain Joint pain  Neurological: Negative for neurological symptoms  Psychologic: Depression Anxiety

## 2020-05-24 DIAGNOSIS — F1020 Alcohol dependence, uncomplicated: Secondary | ICD-10-CM | POA: Diagnosis not present

## 2020-05-25 DIAGNOSIS — F1020 Alcohol dependence, uncomplicated: Secondary | ICD-10-CM | POA: Diagnosis not present

## 2020-05-25 LAB — HEPATIC FUNCTION PANEL
AG Ratio: 1.1 (calc) (ref 1.0–2.5)
ALT: 31 U/L — ABNORMAL HIGH (ref 6–29)
AST: 66 U/L — ABNORMAL HIGH (ref 10–35)
Albumin: 3.8 g/dL (ref 3.6–5.1)
Alkaline phosphatase (APISO): 195 U/L — ABNORMAL HIGH (ref 37–153)
Bilirubin, Direct: 0.2 mg/dL (ref 0.0–0.2)
Globulin: 3.6 g/dL (calc) (ref 1.9–3.7)
Indirect Bilirubin: 0.5 mg/dL (calc) (ref 0.2–1.2)
Total Bilirubin: 0.7 mg/dL (ref 0.2–1.2)
Total Protein: 7.4 g/dL (ref 6.1–8.1)

## 2020-05-25 LAB — IGG, IGA, IGM
IgG (Immunoglobin G), Serum: 1702 mg/dL — ABNORMAL HIGH (ref 600–1640)
IgM, Serum: 139 mg/dL (ref 50–300)
Immunoglobulin A: 783 mg/dL — ABNORMAL HIGH (ref 47–310)

## 2020-05-25 LAB — PROTIME-INR
INR: 1.1
Prothrombin Time: 11.6 s — ABNORMAL HIGH (ref 9.0–11.5)

## 2020-05-25 LAB — HEPATITIS B CORE ANTIBODY, TOTAL: Hep B Core Total Ab: NONREACTIVE

## 2020-05-25 LAB — HEPATITIS A ANTIBODY, TOTAL: Hepatitis A AB,Total: NONREACTIVE

## 2020-05-25 LAB — IRON,TIBC AND FERRITIN PANEL
%SAT: 33 % (calc) (ref 16–45)
Ferritin: 91 ng/mL (ref 16–232)
Iron: 90 ug/dL (ref 45–160)
TIBC: 272 mcg/dL (calc) (ref 250–450)

## 2020-05-25 LAB — ANA: Anti Nuclear Antibody (ANA): NEGATIVE

## 2020-05-25 LAB — MITOCHONDRIAL ANTIBODIES: Mitochondrial M2 Ab, IgG: <=20 U

## 2020-05-25 LAB — ANTI-SMOOTH MUSCLE ANTIBODY, IGG: Actin (Smooth Muscle) Antibody (IGG): <20 U (ref <20)

## 2020-05-25 LAB — HEPATITIS B SURFACE ANTIBODY,QUALITATIVE: Hep B S Ab: NONREACTIVE

## 2020-05-26 DIAGNOSIS — F1020 Alcohol dependence, uncomplicated: Secondary | ICD-10-CM | POA: Diagnosis not present

## 2020-05-27 DIAGNOSIS — F1020 Alcohol dependence, uncomplicated: Secondary | ICD-10-CM | POA: Diagnosis not present

## 2020-05-28 DIAGNOSIS — F1020 Alcohol dependence, uncomplicated: Secondary | ICD-10-CM | POA: Diagnosis not present

## 2020-05-29 DIAGNOSIS — F1020 Alcohol dependence, uncomplicated: Secondary | ICD-10-CM | POA: Diagnosis not present

## 2020-05-30 DIAGNOSIS — F1020 Alcohol dependence, uncomplicated: Secondary | ICD-10-CM | POA: Diagnosis not present

## 2020-05-30 LAB — HEMOCHROMATOSIS DNA-PCR(C282Y,H63D)

## 2020-05-31 ENCOUNTER — Encounter: Payer: Self-pay | Admitting: Internal Medicine

## 2020-05-31 ENCOUNTER — Telehealth: Payer: Self-pay

## 2020-05-31 DIAGNOSIS — F331 Major depressive disorder, recurrent, moderate: Secondary | ICD-10-CM | POA: Diagnosis not present

## 2020-05-31 NOTE — Telephone Encounter (Signed)
Pt returned call for lab results. Routing message to [place].

## 2020-05-31 NOTE — Progress Notes (Signed)
PATIENT SCHEDULED  °

## 2020-06-01 ENCOUNTER — Other Ambulatory Visit: Payer: Self-pay

## 2020-06-01 ENCOUNTER — Telehealth: Payer: Self-pay | Admitting: Internal Medicine

## 2020-06-01 DIAGNOSIS — R7989 Other specified abnormal findings of blood chemistry: Secondary | ICD-10-CM

## 2020-06-01 NOTE — Telephone Encounter (Signed)
See result note.  

## 2020-06-01 NOTE — Telephone Encounter (Signed)
Pt returning call. [phone]

## 2020-06-06 ENCOUNTER — Other Ambulatory Visit: Payer: Self-pay | Admitting: Family Medicine

## 2020-06-06 DIAGNOSIS — F411 Generalized anxiety disorder: Secondary | ICD-10-CM

## 2020-06-06 NOTE — Telephone Encounter (Signed)
Ok to refill 

## 2020-06-09 NOTE — Patient Instructions (Signed)
Your procedure is scheduled on: 06/14/2020  Report to [facility] at 6:45    AM.  Call this number if you have problems the morning of surgery: [phone]   Remember:              Follow Directions on the letter you received from Your Physician's office regarding the Bowel Prep              No Smoking the day of Procedure :   Take these medicines the morning of surgery with A SIP OF WATER: Klonopin, Cymbalta, metoprolol, and protonix   Do not wear jewelry, make-up or nail polish.    Do not bring valuables to the hospital.  Contacts, dentures or bridgework may not be worn into surgery.  .   Patients discharged the day of surgery will not be allowed to drive home.     Colonoscopy, Adult, Care After This sheet gives you information about how to care for yourself after your procedure. Your health care provider may also give you more specific instructions. If you have problems or questions, contact your health care provider. What can I expect after the procedure? After the procedure, it is common to have:  A small amount of blood in your stool for 24 hours after the procedure.  Some gas.  Mild abdominal cramping or bloating.  Follow these instructions at home: General instructions   For the first 24 hours after the procedure: ? Do not drive or use machinery. ? Do not sign important documents. ? Do not drink alcohol. ? Do your regular daily activities at a slower pace than normal. ? Eat soft, easy-to-digest foods. ? Rest often.  Take over-the-counter or prescription medicines only as told by your health care provider.  It is up to you to get the results of your procedure. Ask your health care provider, or the department performing the procedure, when your results will be ready. Relieving cramping and bloating  Try walking around when you have cramps or feel bloated.  Apply heat to your abdomen as told by your health care provider. Use a heat source that your health  care provider recommends, such as a moist heat pack or a heating pad. ? Place a towel between your skin and the heat source. ? Leave the heat on for 20-30 minutes. ? Remove the heat if your skin turns bright red. This is especially important if you are unable to feel pain, heat, or cold. You may have a greater risk of getting burned. Eating and drinking  Drink enough fluid to keep your urine clear or pale yellow.  Resume your normal diet as instructed by your health care provider. Avoid heavy or fried foods that are hard to digest.  Avoid drinking alcohol for as long as instructed by your health care provider. Contact a health care provider if:  You have blood in your stool 2-3 days after the procedure. Get help right away if:  You have more than a small spotting of blood in your stool.  You pass large blood clots in your stool.  Your abdomen is swollen.  You have nausea or vomiting.  You have a fever.  You have increasing abdominal pain that is not relieved with medicine. This information is not intended to replace advice given to you by your health care provider. Make sure you discuss any questions you have with your health care provider. Document Released: 02/21/2004 Document Revised: 04/02/2016 Document Reviewed: 09/20/2015 Elsevier Interactive Patient Education  2018 [place].  Upper Endoscopy, Adult, Care After This sheet gives you information about how to care for yourself after your procedure. Your health care provider may also give you more specific instructions. If you have problems or questions, contact your health care provider. What can I expect after the procedure? After the procedure, it is common to have:  A sore throat.  Mild stomach pain or discomfort.  Bloating.  Nausea. Follow these instructions at home:   Follow instructions from your health care provider about what to eat or drink after your procedure.  Return to your normal activities as  told by your health care provider. Ask your health care provider what activities are safe for you.  Take over-the-counter and prescription medicines only as told by your health care provider.  Do not drive for 24 hours if you were given a sedative during your procedure.  Keep all follow-up visits as told by your health care provider. This is important. Contact a health care provider if you have:  A sore throat that lasts longer than one day.  Trouble swallowing. Get help right away if:  You vomit blood or your vomit looks like coffee grounds.  You have: ? A fever. ? Bloody, black, or tarry stools. ? A severe sore throat or you cannot swallow. ? Difficulty breathing. ? Severe pain in your chest or abdomen. Summary  After the procedure, it is common to have a sore throat, mild stomach discomfort, bloating, and nausea.  Do not drive for 24 hours if you were given a sedative during the procedure.  Follow instructions from your health care provider about what to eat or drink after your procedure.  Return to your normal activities as told by your health care provider. This information is not intended to replace advice given to you by your health care provider. Make sure you discuss any questions you have with your health care provider. Document Revised: 12/31/2017 Document Reviewed: 12/09/2017 Elsevier Patient Education  [place].

## 2020-06-10 ENCOUNTER — Encounter (HOSPITAL_COMMUNITY): Payer: Self-pay | Admitting: Anesthesiology

## 2020-06-10 ENCOUNTER — Other Ambulatory Visit (HOSPITAL_COMMUNITY)
Admission: RE | Admit: 2020-06-10 | Discharge: 2020-06-10 | Disposition: A | Discharge status: Home / Self Care | Payer: Medicare HMO | Source: Ambulatory Visit | Attending: Internal Medicine | Admitting: Internal Medicine

## 2020-06-10 ENCOUNTER — Encounter (HOSPITAL_COMMUNITY): Payer: Self-pay

## 2020-06-10 ENCOUNTER — Encounter (HOSPITAL_COMMUNITY)
Admission: RE | Admit: 2020-06-10 | Discharge: 2020-06-10 | Disposition: A | Discharge status: Home / Self Care | Payer: Medicare HMO | Source: Ambulatory Visit | Attending: Internal Medicine | Admitting: Internal Medicine

## 2020-06-10 ENCOUNTER — Other Ambulatory Visit: Payer: Self-pay | Admitting: Nurse Practitioner

## 2020-06-10 ENCOUNTER — Other Ambulatory Visit (HOSPITAL_COMMUNITY): Payer: Medicare HMO

## 2020-06-10 ENCOUNTER — Other Ambulatory Visit: Payer: Self-pay

## 2020-06-10 DIAGNOSIS — Z01818 Encounter for other preprocedural examination: Secondary | ICD-10-CM | POA: Diagnosis not present

## 2020-06-10 DIAGNOSIS — Z20822 Contact with and (suspected) exposure to covid-19: Secondary | ICD-10-CM | POA: Insufficient documentation

## 2020-06-10 DIAGNOSIS — I10 Essential (primary) hypertension: Secondary | ICD-10-CM | POA: Diagnosis not present

## 2020-06-10 DIAGNOSIS — E876 Hypokalemia: Secondary | ICD-10-CM

## 2020-06-10 LAB — COMPREHENSIVE METABOLIC PANEL
ALT: 23 U/L (ref 0–44)
AST: 52 U/L — ABNORMAL HIGH (ref 15–41)
Albumin: 3.7 g/dL (ref 3.5–5.0)
Alkaline Phosphatase: 124 U/L (ref 38–126)
Anion gap: 10 (ref 5–15)
BUN: <5 mg/dL — ABNORMAL LOW (ref 6–20)
CO2: 26 mmol/L (ref 22–32)
Calcium: 9.2 mg/dL (ref 8.9–10.3)
Chloride: 92 mmol/L — ABNORMAL LOW (ref 98–111)
Creatinine, Ser: 0.49 mg/dL (ref 0.44–1.00)
GFR, Estimated: >60 mL/min (ref >60)
Glucose, Bld: 81 mg/dL (ref 70–99)
Potassium: 3.2 mmol/L — ABNORMAL LOW (ref 3.5–5.1)
Sodium: 128 mmol/L — ABNORMAL LOW (ref 135–145)
Total Bilirubin: 0.6 mg/dL (ref 0.3–1.2)
Total Protein: 8.2 g/dL — ABNORMAL HIGH (ref 6.5–8.1)

## 2020-06-10 LAB — SARS CORONAVIRUS 2 (TAT 6-24 HRS): SARS Coronavirus 2: NEGATIVE

## 2020-06-10 MED ORDER — POTASSIUM CHLORIDE ER 20 MEQ PO TBCR: EXTENDED_RELEASE_TABLET | ORAL | 0 refills | Status: DC

## 2020-06-10 NOTE — Progress Notes (Signed)
Patient notified by phone that  her K 3.2 and she needs to pick up potassium tabs at the pharmacy 20 mEq 2 times a day for 2 days.  Also instructed her to eat a small bag of potato chips because her sodium was low 128.

## 2020-06-10 NOTE — Progress Notes (Signed)
Received call from short stay about hypokalemia and hyponatremia. Anesthesia asking for electrolyte correction.   Rx to pharmacy for 20 mEq bid x 2 days. Eat a small, individual size potato chips for sodium. Dr Abbey Chatters notified.  Discussed with Rosalyn Gess who will call the patient and notify her of recommendations.   Thank you for allowing [place] to participate in the care of [place], DNP, AGNP-C Adult & Gerontological Nurse Practitioner [facility]

## 2020-06-11 LAB — AFP TUMOR MARKER: AFP, Serum, Tumor Marker: 3.5 ng/mL (ref 0.0–8.3)

## 2020-06-14 ENCOUNTER — Encounter (HOSPITAL_COMMUNITY): Payer: Self-pay

## 2020-06-14 ENCOUNTER — Encounter (HOSPITAL_COMMUNITY)
Admission: AD | Disposition: A | Discharge status: Home / Self Care | Payer: Self-pay | Source: Home / Self Care | Attending: Internal Medicine

## 2020-06-14 ENCOUNTER — Other Ambulatory Visit: Payer: Self-pay

## 2020-06-14 ENCOUNTER — Inpatient Hospital Stay (HOSPITAL_COMMUNITY)
Admission: AD | Admit: 2020-06-14 | Discharge: 2020-06-15 | DRG: 641 | Disposition: A | Discharge status: Home / Self Care | Payer: Medicare HMO | Attending: Internal Medicine | Admitting: Internal Medicine

## 2020-06-14 DIAGNOSIS — Z79899 Other long term (current) drug therapy: Secondary | ICD-10-CM

## 2020-06-14 DIAGNOSIS — K766 Portal hypertension: Secondary | ICD-10-CM | POA: Diagnosis not present

## 2020-06-14 DIAGNOSIS — K3189 Other diseases of stomach and duodenum: Secondary | ICD-10-CM | POA: Diagnosis not present

## 2020-06-14 DIAGNOSIS — I10 Essential (primary) hypertension: Secondary | ICD-10-CM | POA: Diagnosis present

## 2020-06-14 DIAGNOSIS — D123 Benign neoplasm of transverse colon: Secondary | ICD-10-CM | POA: Diagnosis not present

## 2020-06-14 DIAGNOSIS — Z8249 Family history of ischemic heart disease and other diseases of the circulatory system: Secondary | ICD-10-CM | POA: Diagnosis not present

## 2020-06-14 DIAGNOSIS — K2100 Gastro-esophageal reflux disease with esophagitis, without bleeding: Secondary | ICD-10-CM | POA: Diagnosis present

## 2020-06-14 DIAGNOSIS — D125 Benign neoplasm of sigmoid colon: Secondary | ICD-10-CM | POA: Diagnosis not present

## 2020-06-14 DIAGNOSIS — F1010 Alcohol abuse, uncomplicated: Secondary | ICD-10-CM | POA: Diagnosis present

## 2020-06-14 DIAGNOSIS — F419 Anxiety disorder, unspecified: Secondary | ICD-10-CM | POA: Diagnosis present

## 2020-06-14 DIAGNOSIS — M1990 Unspecified osteoarthritis, unspecified site: Secondary | ICD-10-CM | POA: Diagnosis not present

## 2020-06-14 DIAGNOSIS — G4730 Sleep apnea, unspecified: Secondary | ICD-10-CM | POA: Diagnosis present

## 2020-06-14 DIAGNOSIS — Z811 Family history of alcohol abuse and dependence: Secondary | ICD-10-CM | POA: Diagnosis not present

## 2020-06-14 DIAGNOSIS — R1314 Dysphagia, pharyngoesophageal phase: Secondary | ICD-10-CM | POA: Diagnosis not present

## 2020-06-14 DIAGNOSIS — F32A Depression, unspecified: Secondary | ICD-10-CM | POA: Diagnosis present

## 2020-06-14 DIAGNOSIS — F17210 Nicotine dependence, cigarettes, uncomplicated: Secondary | ICD-10-CM | POA: Diagnosis present

## 2020-06-14 DIAGNOSIS — M109 Gout, unspecified: Secondary | ICD-10-CM | POA: Diagnosis present

## 2020-06-14 DIAGNOSIS — K222 Esophageal obstruction: Secondary | ICD-10-CM | POA: Diagnosis present

## 2020-06-14 DIAGNOSIS — K2289 Other specified disease of esophagus: Secondary | ICD-10-CM | POA: Diagnosis not present

## 2020-06-14 DIAGNOSIS — K644 Residual hemorrhoidal skin tags: Secondary | ICD-10-CM | POA: Diagnosis present

## 2020-06-14 DIAGNOSIS — K625 Hemorrhage of anus and rectum: Secondary | ICD-10-CM | POA: Diagnosis present

## 2020-06-14 DIAGNOSIS — G4700 Insomnia, unspecified: Secondary | ICD-10-CM | POA: Diagnosis present

## 2020-06-14 DIAGNOSIS — K219 Gastro-esophageal reflux disease without esophagitis: Secondary | ICD-10-CM

## 2020-06-14 DIAGNOSIS — Q394 Esophageal web: Secondary | ICD-10-CM | POA: Diagnosis not present

## 2020-06-14 DIAGNOSIS — Z801 Family history of malignant neoplasm of trachea, bronchus and lung: Secondary | ICD-10-CM

## 2020-06-14 DIAGNOSIS — E871 Hypo-osmolality and hyponatremia: Secondary | ICD-10-CM | POA: Diagnosis not present

## 2020-06-14 DIAGNOSIS — K7030 Alcoholic cirrhosis of liver without ascites: Secondary | ICD-10-CM | POA: Diagnosis present

## 2020-06-14 DIAGNOSIS — K648 Other hemorrhoids: Secondary | ICD-10-CM | POA: Diagnosis not present

## 2020-06-14 DIAGNOSIS — K635 Polyp of colon: Secondary | ICD-10-CM | POA: Diagnosis present

## 2020-06-14 DIAGNOSIS — Z791 Long term (current) use of non-steroidal anti-inflammatories (NSAID): Secondary | ICD-10-CM

## 2020-06-14 DIAGNOSIS — F418 Other specified anxiety disorders: Secondary | ICD-10-CM

## 2020-06-14 DIAGNOSIS — K209 Esophagitis, unspecified without bleeding: Secondary | ICD-10-CM | POA: Diagnosis not present

## 2020-06-14 DIAGNOSIS — Z833 Family history of diabetes mellitus: Secondary | ICD-10-CM | POA: Diagnosis not present

## 2020-06-14 DIAGNOSIS — Z8541 Personal history of malignant neoplasm of cervix uteri: Secondary | ICD-10-CM

## 2020-06-14 DIAGNOSIS — Z8719 Personal history of other diseases of the digestive system: Secondary | ICD-10-CM

## 2020-06-14 LAB — BASIC METABOLIC PANEL
Anion gap: 10 (ref 5–15)
Anion gap: 6 (ref 5–15)
BUN: 9 mg/dL (ref 6–20)
BUN: 7 mg/dL (ref 6–20)
CO2: 24 mmol/L (ref 22–32)
CO2: 21 mmol/L — ABNORMAL LOW (ref 22–32)
Calcium: 9 mg/dL (ref 8.9–10.3)
Calcium: 8.5 mg/dL — ABNORMAL LOW (ref 8.9–10.3)
Chloride: 89 mmol/L — ABNORMAL LOW (ref 98–111)
Chloride: 98 mmol/L (ref 98–111)
Creatinine, Ser: 0.66 mg/dL (ref 0.44–1.00)
Creatinine, Ser: 0.62 mg/dL (ref 0.44–1.00)
GFR, Estimated: >60 mL/min (ref >60)
GFR, Estimated: >60 mL/min (ref >60)
Glucose, Bld: 106 mg/dL — ABNORMAL HIGH (ref 70–99)
Glucose, Bld: 156 mg/dL — ABNORMAL HIGH (ref 70–99)
Potassium: 3.6 mmol/L (ref 3.5–5.1)
Potassium: 3.5 mmol/L (ref 3.5–5.1)
Sodium: 123 mmol/L — ABNORMAL LOW (ref 135–145)
Sodium: 125 mmol/L — ABNORMAL LOW (ref 135–145)

## 2020-06-14 LAB — CBC
HCT: 38.2 % (ref 36.0–46.0)
Hemoglobin: 12.8 g/dL (ref 12.0–15.0)
MCH: 32.2 pg (ref 26.0–34.0)
MCHC: 33.5 g/dL (ref 30.0–36.0)
MCV: 96.2 fL (ref 80.0–100.0)
Platelets: 279 10*3/uL (ref 150–400)
RBC: 3.97 MIL/uL (ref 3.87–5.11)
RDW: 12.2 % (ref 11.5–15.5)
WBC: 11 10*3/uL — ABNORMAL HIGH (ref 4.0–10.5)
nRBC: 0 % (ref 0.0–0.2)

## 2020-06-14 LAB — PROTIME-INR
INR: 1.2 (ref 0.8–1.2)
Prothrombin Time: 14.9 seconds (ref 11.4–15.2)

## 2020-06-14 LAB — OSMOLALITY: Osmolality: 271 mOsm/kg — ABNORMAL LOW (ref 275–295)

## 2020-06-14 LAB — POCT I-STAT, CHEM 8
BUN: 9 mg/dL (ref 6–20)
Calcium, Ion: 1.1 mmol/L — ABNORMAL LOW (ref 1.15–1.40)
Chloride: 87 mmol/L — ABNORMAL LOW (ref 98–111)
Creatinine, Ser: 0.6 mg/dL (ref 0.44–1.00)
Glucose, Bld: 106 mg/dL — ABNORMAL HIGH (ref 70–99)
HCT: 46 % (ref 36.0–46.0)
Hemoglobin: 15.6 g/dL — ABNORMAL HIGH (ref 12.0–15.0)
Potassium: 3.6 mmol/L (ref 3.5–5.1)
Sodium: 126 mmol/L — ABNORMAL LOW (ref 135–145)
TCO2: 27 mmol/L (ref 22–32)

## 2020-06-14 LAB — TSH: TSH: 1.639 u[IU]/mL (ref 0.350–4.500)

## 2020-06-14 LAB — MAGNESIUM: Magnesium: 1.6 mg/dL — ABNORMAL LOW (ref 1.7–2.4)

## 2020-06-14 LAB — PHOSPHORUS: Phosphorus: 3.8 mg/dL (ref 2.5–4.6)

## 2020-06-14 SURGERY — COLONOSCOPY WITH PROPOFOL
Anesthesia: Monitor Anesthesia Care

## 2020-06-14 MED ORDER — FOLIC ACID 1 MG PO TABS
1.0000 mg | ORAL_TABLET | Freq: Every day | ORAL | Status: DC
  Administered 2020-06-14: 1 mg via ORAL
  Filled 2020-06-14: qty 1

## 2020-06-14 MED ORDER — CLONAZEPAM 0.5 MG PO TABS
2.0000 mg | ORAL_TABLET | Freq: Every day | ORAL | Status: DC
  Administered 2020-06-14: 2 mg via ORAL
  Filled 2020-06-14: qty 4

## 2020-06-14 MED ORDER — LORAZEPAM 1 MG PO TABS: 1.0000 mg | ORAL_TABLET | ORAL | Status: DC | PRN

## 2020-06-14 MED ORDER — PNEUMOCOCCAL VAC POLYVALENT 25 MCG/0.5ML IJ INJ: 0.5000 mL | INJECTION | INTRAMUSCULAR | Status: DC

## 2020-06-14 MED ORDER — LACTATED RINGERS IV SOLN: Freq: Once | INTRAVENOUS | Status: DC

## 2020-06-14 MED ORDER — ONDANSETRON HCL 4 MG/2ML IJ SOLN: 4.0000 mg | Freq: Four times a day (QID) | INTRAMUSCULAR | Status: DC | PRN

## 2020-06-14 MED ORDER — GLYCOPYRROLATE 0.2 MG/ML IJ SOLN: 0.2000 mg | Freq: Once | INTRAMUSCULAR | Status: DC

## 2020-06-14 MED ORDER — ONDANSETRON HCL 4 MG PO TABS: 4.0000 mg | ORAL_TABLET | Freq: Four times a day (QID) | ORAL | Status: DC | PRN

## 2020-06-14 MED ORDER — ACETAMINOPHEN 325 MG PO TABS: 650.0000 mg | ORAL_TABLET | Freq: Four times a day (QID) | ORAL | Status: DC | PRN

## 2020-06-14 MED ORDER — ADULT MULTIVITAMIN W/MINERALS CH
1.0000 | ORAL_TABLET | Freq: Every day | ORAL | Status: DC
  Administered 2020-06-14: 1 via ORAL
  Filled 2020-06-14: qty 1

## 2020-06-14 MED ORDER — MAGNESIUM SULFATE 2 GM/50ML IV SOLN
2.0000 g | Freq: Once | INTRAVENOUS | Status: AC
  Administered 2020-06-14: 2 g via INTRAVENOUS
  Filled 2020-06-14: qty 50

## 2020-06-14 MED ORDER — LORAZEPAM 2 MG/ML IJ SOLN: 1.0000 mg | INTRAMUSCULAR | Status: DC | PRN

## 2020-06-14 MED ORDER — LIDOCAINE VISCOUS HCL 2 % MT SOLN: 15.0000 mL | Freq: Once | OROMUCOSAL | Status: DC

## 2020-06-14 MED ORDER — ALLOPURINOL 300 MG PO TABS
300.0000 mg | ORAL_TABLET | Freq: Every day | ORAL | Status: DC
  Administered 2020-06-14: 300 mg via ORAL
  Filled 2020-06-14: qty 1

## 2020-06-14 MED ORDER — CHLORHEXIDINE GLUCONATE CLOTH 2 % EX PADS: 6.0000 | MEDICATED_PAD | Freq: Once | CUTANEOUS | Status: DC

## 2020-06-14 MED ORDER — DULOXETINE HCL 60 MG PO CPEP
60.0000 mg | ORAL_CAPSULE | Freq: Every day | ORAL | Status: DC
  Administered 2020-06-14: 60 mg via ORAL
  Filled 2020-06-14: qty 1

## 2020-06-14 MED ORDER — SODIUM CHLORIDE 0.9 % IV SOLN: INTRAVENOUS | Status: DC

## 2020-06-14 MED ORDER — PANTOPRAZOLE SODIUM 40 MG IV SOLR
40.0000 mg | INTRAVENOUS | Status: DC
  Administered 2020-06-14: 40 mg via INTRAVENOUS
  Filled 2020-06-14: qty 40

## 2020-06-14 MED ORDER — THIAMINE HCL 100 MG PO TABS
100.0000 mg | ORAL_TABLET | Freq: Every day | ORAL | Status: DC
  Administered 2020-06-14: 100 mg via ORAL
  Filled 2020-06-14: qty 1

## 2020-06-14 MED ORDER — THIAMINE HCL 100 MG/ML IJ SOLN
100.0000 mg | Freq: Every day | INTRAMUSCULAR | Status: DC
  Administered 2020-06-15: 100 mg via INTRAVENOUS
  Filled 2020-06-14: qty 2

## 2020-06-14 MED ORDER — BUPROPION HCL ER (SR) 150 MG PO TB12
150.0000 mg | ORAL_TABLET | Freq: Two times a day (BID) | ORAL | Status: DC
  Administered 2020-06-14: 150 mg via ORAL
  Filled 2020-06-14: qty 1

## 2020-06-14 MED ORDER — ACETAMINOPHEN 650 MG RE SUPP: 650.0000 mg | Freq: Four times a day (QID) | RECTAL | Status: DC | PRN

## 2020-06-14 MED ORDER — METOPROLOL SUCCINATE ER 50 MG PO TB24
50.0000 mg | ORAL_TABLET | Freq: Every day | ORAL | Status: DC
  Administered 2020-06-14: 50 mg via ORAL
  Filled 2020-06-14: qty 1

## 2020-06-14 NOTE — Progress Notes (Addendum)
    Subjective: States she drank entire prep, running clear. Feels tired and hungry. No mental status changes or confusion. No abdominal pain or overt GI bleeding recently. Continues with solid food dysphagia. Wants to pursue endoscopic evaluation while here. Drinks 3-4 beers per day. Last beer while watching the [employer].   Objective: Vital signs in last 24 hours: Temp:  [97.7 F (36.5 C)] 97.7 F (36.5 C) (11/23 0739) Pulse Rate:  [75] 75 (11/23 0739) Resp:  [18] 18 (11/23 0739) BP: (155)/(75) 155/75 (11/23 0739) SpO2:  [95 %] 95 % (11/23 0739) Weight:  [72.6 kg] 72.6 kg (11/23 0739)   General:   Alert and oriented, pleasant, sallow complexion Head:  Normocephalic and atraumatic. Eyes:  No icterus, sclera clear. Conjuctiva pink.  Mouth:  Without lesions, mucosa pink and moist.  Heart:  S1, S2 present, no murmurs noted.  Lungs: Clear to auscultation bilaterally, without wheezing, rales, or rhonchi.  Abdomen:  Bowel sounds present, soft, non-tender, non-distended. Diastasis recti vs ventral hernia upper abdomen Msk:  Symmetrical without gross deformities. Normal posture. Extremities:  Without  edema. Neurologic:  Alert and  oriented x4 Psych:  Alert and cooperative. Normal mood and affect.  Intake/Output from previous day: No intake/output data recorded. Intake/Output this shift: No intake/output data recorded.  BMET Recent Labs    06/14/20 0751  NA 123*  K 3.6  CL 89*  CO2 24  GLUCOSE 106*  BUN 9  CREATININE 0.66  CALCIUM 9.0      Assessment: 59 year old female presented to Endoscopy today for colonoscopy/EGD/dilation, and was found to have hyponatremia with sodium of 123. Chronic history of ETOH use, likely cirrhosis as seen on recent imaging, with thorough serologies on file and felt to have elevated LFTs and underlying liver disease due to ETOH and fatty liver. Due to rectal bleeding as outpatient, colonoscopy was planned for diagnostic purposes;  she also has a history of polyps in Feb 2019. EGD/dilation planned for dysphagia, with known history of esophagitis on EGD in 2019.   Patient admitted for correction of hyponatremia, with hopeful plans for colonoscopy/EGD while inpatient if feasible. Hyponatremia may preclude evaluation while inpatient and may have to pursue in elective setting, depending on clinical course and holiday upcoming.   No significant changes since seen in office as outpatient. Will start clear liquids now, make NPO after midnight, and reassess in the morning if sodium in appropriate range.   Appreciate assistance by Dr. Manuella Ghazi in admitting patient today.    Plan: Clear liquids NPO after midnight BMP in am Reassess in am   Annitta Needs, PhD, ANP-BC [facility]    LOS: 0 days    06/14/2020, 11:32 AM

## 2020-06-14 NOTE — Anesthesia Preprocedure Evaluation (Deleted)
Anesthesia Evaluation  Patient identified by MRN, date of birth, ID band Patient awake    Reviewed: Allergy & Precautions, NPO status , Patient's Chart, lab work & pertinent test results  History of Anesthesia Complications Negative for: history of anesthetic complications  Airway        Dental  (+) Dental Advisory Given   Pulmonary sleep apnea , Current Smoker,    Pulmonary exam normal breath sounds clear to auscultation       Cardiovascular hypertension, Pt. on medications Normal cardiovascular exam Rhythm:Regular Rate:Normal     Neuro/Psych PSYCHIATRIC DISORDERS Anxiety Depression    GI/Hepatic GERD  Medicated,(+)     substance abuse (alcohol abuse )  alcohol use,   Endo/Other  negative endocrine ROS  Renal/GU   negative genitourinary   Musculoskeletal  (+) Arthritis  (low back pain),   Abdominal   Peds negative pediatric ROS (+)  Hematology negative hematology ROS (+)   Anesthesia Other Findings Severe hyponatremia - 123, discussed with Dr. Abbey Chatters and Dr. Brigitte Pulse, will be admitted to the hospital and will be rescheduled after optimizing the sodium levels.  Reproductive/Obstetrics negative OB ROS                           Anesthesia Physical Anesthesia Plan  ASA: III  Anesthesia Plan: General   Post-op Pain Management:    Induction: Intravenous  PONV Risk Score and Plan: TIVA  Airway Management Planned: Nasal Cannula and Natural Airway  Additional Equipment:   Intra-op Plan:   Post-operative Plan:   Informed Consent: I have reviewed the patients History and Physical, chart, labs and discussed the procedure including the risks, benefits and alternatives for the proposed anesthesia with the patient or authorized representative who has indicated his/her understanding and acceptance.     Dental advisory given  Plan Discussed with: CRNA and Surgeon  Anesthesia Plan  Comments: (Severe hyponatremia - 123, discussed with Dr. Abbey Chatters and Dr. Brigitte Pulse, will be admitted to the hospital and will be rescheduled after optimizing the sodium levels.)       Anesthesia Quick Evaluation

## 2020-06-14 NOTE — Progress Notes (Signed)
BMET results reviewed by Dr. Charna Elizabeth & Dr. Abbey Chatters & both agreed to cancel procedure for today & pt to be admitted for hyponatremia. Dr. Manuella Ghazi put orders in for admission. Report given to [employer]. Pt. Verbalized understanding.  Belongings sent to room 313 with pt.

## 2020-06-14 NOTE — H&P (Signed)
History and Physical    Regina Richardson PYP:[id number] DOB: November 14, 1960 DOA: 06/14/2020  PCP: Minerd Brooklyn, FNP   Patient coming from: Home  Chief Complaint: Hyponatremia  HPI: Regina Richardson is a 59 y.o. female with medical history significant for ongoing alcohol abuse with beer, hypertension, anxiety/depression, insomnia, esophagitis/GERD, rectal bleeding, osteoarthritis, and liver cirrhosis secondary to alcohol use who was scheduled for elective EGD with dilation as well as colonoscopy today and was noted to have a sodium level of 123.  She has been having some mild weakness and dizziness in the last few days and states that she has not eaten in 2 days.  She denies any nausea or vomiting and continues to have a persistent/chronic epigastric abdominal pain.  She was recently noted to have some rectal bleeding as well, but this has improved considerably in the last few days.  Her GI studies were ordered in order to further assess the cause for her rectal bleeding as well as to perform dilation for dysphagia.  She denies any fevers, chills, chest pain, or shortness of breath.  She states that she does drink about 4-5 beers on a daily basis and smokes 1/2 pack/day.  She denies any other drug use.  Review of Systems: All others reviewed as noted above and otherwise negative.  Past Medical History:  Diagnosis Date  . Alcoholism ([place])   . Anxiety   . Arrhythmia    heart  . Arthritis   . Cancer ([place])    cervical cancer in the 80s  . Depression   . GERD (gastroesophageal reflux disease)   . Hypertension   . Osteoporosis   . Sleep apnea    no CPAP  . Urinary incontinence     Past Surgical History:  Procedure Laterality Date  . ABDOMINAL HYSTERECTOMY     still has ovaries  . CERVICAL BIOPSY  W/ LOOP ELECTRODE EXCISION    . CESAREAN SECTION    . COLONOSCOPY  08/2017   Dr. Fuller Plan;  prominent and moderately lipomatous ileocecal valve, 9 mm sessile polyp in hepatic flexure, 8 mm sessile  polyp in the descending colon, otherwise normal exam.  Pathology with sessile serrated polyp and hyperplastic polyp.  Recommended repeat colonoscopy in 5 years.  . COLPOSCOPY    . ESOPHAGOGASTRODUODENOSCOPY  08/2017   Dr. Fuller Plan; LA grade B esophagitis without bleeding s/p biopsied, diffuse moderate inflammation and granularity in the entire examined stomach s/p biopsied, normal examined duodenum s/p biopsy. Gastric biopsy with chronic inactive gastritis without H. pylori, esophageal biopsy with mildly inflamed squamous mucosa, duodenal biopsy benign.  Marland Kitchen FINGER SURGERY    . HEMORRHOID SURGERY    . left collar bone surgery     had fx, pin placed and then complitcations caused them to remove most of the bone  . left forearm surgery     from left elbow down     reports that she has been smoking cigarettes. She has a 60.00 pack-year smoking history. She has never used smokeless tobacco. She reports current alcohol use of about 24.0 standard drinks of alcohol per week. She reports that she does not use drugs.  No Known Allergies  Family History  Problem Relation Age of Onset  . Alcohol abuse Mother   . Liver disease Mother        alcoholic cirrhosis  . Heart disease Father   . Alcohol abuse Father   . Lung cancer Father   . Diabetes Sister   . Cirrhosis Brother  secondary to alcohol.   . Liver disease Sister        Alcoholic cirrhosis  . Diabetes Maternal Aunt   . Kidney disease Maternal Aunt   . Diabetes Cousin        mat cousin  . Breast cancer Neg Hx   . Colon cancer Neg Hx   . Esophageal cancer Neg Hx   . Stomach cancer Neg Hx   . Rectal cancer Neg Hx     Prior to Admission medications   Medication Sig Start Date End Date Taking? Authorizing Provider  allopurinol (ZYLOPRIM) 300 MG tablet Take 1 tablet (300 mg total) by mouth daily. 03/08/20  Yes Ivy Lynn, NP  buPROPion (ZYBAN) 150 MG 12 hr tablet  04/20/20  Yes [provider]  clonazePAM (KLONOPIN) 2 MG  tablet Take 2 mg by mouth at bedtime.  10/12/19  Yes [provider]  cyclobenzaprine (FLEXERIL) 10 MG tablet TAKE 1TAB 3 TIMES A DAY AS NEEDED FOR MUSCLE SPASMS. IF CAUSING DROWSINESS TAKE 1TAB AT BEDTIME ONLY Patient taking differently: Take 10 mg by mouth 3 (three) times daily as needed (spasms.).  12/09/18  Yes Bressler, Modena Nunnery, MD  DULoxetine (CYMBALTA) 60 MG capsule TAKE 1 CAPSULE BY MOUTH EVERY DAY 06/07/20  Yes Susy Frizzle, MD  hydrOXYzine (ATARAX/VISTARIL) 25 MG tablet TAKE 1 TABLET BY MOUTH EVERY 8 HOURS AS NEEDED 06/07/20  Yes Susy Frizzle, MD  meloxicam (MOBIC) 7.5 MG tablet TAKE 1 TABLET BY MOUTH EVERY DAY WITH BREAKFAST Patient taking differently: Take 7.5 mg by mouth daily.  03/08/20  Yes Ivy Lynn, NP  metoprolol succinate (TOPROL-XL) 50 MG 24 hr tablet TAKE 1 TABLET BY MOUTH EVERY DAY WITH OR IMMEDIATELY FOLLOWING A MEAL Patient taking differently: Take 50 mg by mouth daily after breakfast. TAKE 1 TABLET BY MOUTH EVERY DAY WITH OR IMMEDIATELY FOLLOWING A MEAL 03/08/20  Yes Ivy Lynn, NP  pantoprazole (PROTONIX) 40 MG tablet Take 1 tablet (40 mg total) by mouth daily before breakfast. 04/11/20  Yes Aliene Altes S, PA-C  potassium chloride 20 MEQ TBCR Take 20 mEq twice daily for two days. 06/10/20  Yes Carlis Stable, NP    Physical Exam: Vitals:   06/14/20 0739  BP: (!) 155/75  Pulse: 75  Resp: 18  Temp: 97.7 F (36.5 C)  TempSrc: Oral  SpO2: 95%  Weight: 72.6 kg  Height: 5' (1.524 m)    Constitutional: NAD, calm, comfortable Vitals:   06/14/20 0739  BP: (!) 155/75  Pulse: 75  Resp: 18  Temp: 97.7 F (36.5 C)  TempSrc: Oral  SpO2: 95%  Weight: 72.6 kg  Height: 5' (1.524 m)   Eyes: lids and conjunctivae normal ENMT: Mucous membranes are moist.  Neck: normal, supple Respiratory: clear to auscultation bilaterally. Normal respiratory effort. No accessory muscle use.  Cardiovascular: Regular rate and rhythm, no murmurs. No  extremity edema. Abdomen: Epigastric abdominal tenderness Musculoskeletal:  No joint deformity upper and lower extremities.   Skin: no rashes, lesions, ulcers.  Psychiatric: Normal judgment and insight. Alert and oriented x 3. Normal mood.   Labs on Admission: I have personally reviewed following labs and imaging studies  CBC: Recent Labs  Lab 06/14/20 0727 06/14/20 1205  WBC  --  11.0*  HGB 15.6* 12.8  HCT 46.0 38.2  MCV  --  96.2  PLT  --  545   Basic Metabolic Panel: Recent Labs  Lab 06/10/20 0819 06/14/20 0727 06/14/20 0751 06/14/20 1205  NA 128* 126* 123*  --   K 3.2* 3.6 3.6  --   CL 92* 87* 89*  --   CO2 26  --  24  --   GLUCOSE 81 106* 106*  --   BUN <5* 9 9  --   CREATININE 0.49 0.60 0.66  --   CALCIUM 9.2  --  9.0  --   MG  --   --   --  1.6*  PHOS  --   --   --  3.8   GFR: Estimated Creatinine Clearance: 67.3 mL/min (by C-G formula based on SCr of 0.66 mg/dL). Liver Function Tests: Recent Labs  Lab 06/10/20 0819  AST 52*  ALT 23  ALKPHOS 124  BILITOT 0.6  PROT 8.2*  ALBUMIN 3.7   No results for input(s): LIPASE, AMYLASE in the last 168 hours. No results for input(s): AMMONIA in the last 168 hours. Coagulation Profile: No results for input(s): INR, PROTIME in the last 168 hours. Cardiac Enzymes: No results for input(s): CKTOTAL, CKMB, CKMBINDEX, TROPONINI in the last 168 hours. BNP (last 3 results) No results for input(s): PROBNP in the last 8760 hours. HbA1C: No results for input(s): HGBA1C in the last 72 hours. CBG: No results for input(s): GLUCAP in the last 168 hours. Lipid Profile: No results for input(s): CHOL, HDL, LDLCALC, TRIG, CHOLHDL, LDLDIRECT in the last 72 hours. Thyroid Function Tests: No results for input(s): TSH, T4TOTAL, FREET4, T3FREE, THYROIDAB in the last 72 hours. Anemia Panel: No results for input(s): VITAMINB12, FOLATE, FERRITIN, TIBC, IRON, RETICCTPCT in the last 72 hours. Urine analysis:    Component Value  Date/Time   COLORURINE YELLOW 05/15/2018 1533   APPEARANCEUR Clear 05/20/2020 1143   LABSPEC 1.002 05/15/2018 1533   PHURINE 6.0 05/15/2018 1533   GLUCOSEU Negative 05/20/2020 1143   HGBUR NEGATIVE 05/15/2018 1533   BILIRUBINUR Negative 05/20/2020 [place] 05/15/2018 1533   PROTEINUR Negative 05/20/2020 1143   PROTEINUR NEGATIVE 05/15/2018 1533   UROBILINOGEN 0.2 04/01/2020 1517   NITRITE Negative 05/20/2020 1143   NITRITE NEGATIVE 05/15/2018 1533   LEUKOCYTESUR Negative 05/20/2020 1143    Radiological Exams on Admission: No results found.   Assessment/Plan Active Problems:   Hyponatremia    Acute on chronic symptomatic hyponatremia -Patient does not have much lab work, but several days prior appear to be at 128 with her sodium -Likely all related to beer [place] mania -Plan to check urine and serum osmolarity as well as urine sodium and TSH to complete work-up -Start IV normal saline and follow BMP this evening and then in a.m. -Per GI, plan to perform EGD and colonoscopy once improved from the standpoint  Mild hypomagnesemia -Replete and reevaluate in a.m.  History of alcohol abuse -CIWA precautions ordered -No signs of withdrawal currently noted  GERD/esophagitis/dysphagia -Plan for EGD in a.m. -Clear liquid diet for now and n.p.o. after midnight -Plan to start IV PPI daily for now  Recent rectal bleed -Plan for colonoscopy in a.m. with recent bowel prep performed  Alcoholic liver cirrhosis -Follow-up with GI -Avoid alcohol in outpatient setting  History of hypertension -Continue metoprolol and monitor -Currently controlled  History of gout/OA -Continue allopurinol -Patient needs to avoid alcohol use outpatient -Avoid NSAIDs  History of anxiety/depression/insomnia -Continue Cymbalta and Klonopin  Ongoing tobacco abuse -Smokes half pack per day -Counseled on cessation  DVT prophylaxis: SCDs Code Status: Full Family Communication:  None at bedside; pt will call sister Disposition Plan:Admit for hyponatremia treatment  with eventual EGD/Colonoscopy Consults called:GI following Admission status: Inpatient, [facility]  If 7PM-7AM, please contact night-coverage [email or web address]  06/14/2020, 12:40 PM

## 2020-06-15 ENCOUNTER — Inpatient Hospital Stay (HOSPITAL_COMMUNITY): Payer: Medicare HMO | Admitting: Certified Registered"

## 2020-06-15 ENCOUNTER — Encounter (HOSPITAL_COMMUNITY): Payer: Self-pay | Admitting: Internal Medicine

## 2020-06-15 ENCOUNTER — Encounter (HOSPITAL_COMMUNITY)
Admission: AD | Disposition: A | Discharge status: Home / Self Care | Payer: Self-pay | Source: Home / Self Care | Attending: Internal Medicine

## 2020-06-15 DIAGNOSIS — E871 Hypo-osmolality and hyponatremia: Principal | ICD-10-CM

## 2020-06-15 DIAGNOSIS — R1314 Dysphagia, pharyngoesophageal phase: Secondary | ICD-10-CM | POA: Diagnosis not present

## 2020-06-15 DIAGNOSIS — K2100 Gastro-esophageal reflux disease with esophagitis, without bleeding: Secondary | ICD-10-CM

## 2020-06-15 DIAGNOSIS — F1010 Alcohol abuse, uncomplicated: Secondary | ICD-10-CM | POA: Diagnosis not present

## 2020-06-15 DIAGNOSIS — Q394 Esophageal web: Secondary | ICD-10-CM | POA: Diagnosis not present

## 2020-06-15 DIAGNOSIS — K222 Esophageal obstruction: Secondary | ICD-10-CM

## 2020-06-15 DIAGNOSIS — K648 Other hemorrhoids: Secondary | ICD-10-CM

## 2020-06-15 DIAGNOSIS — F418 Other specified anxiety disorders: Secondary | ICD-10-CM | POA: Diagnosis not present

## 2020-06-15 DIAGNOSIS — K209 Esophagitis, unspecified without bleeding: Secondary | ICD-10-CM | POA: Diagnosis not present

## 2020-06-15 DIAGNOSIS — K766 Portal hypertension: Secondary | ICD-10-CM | POA: Diagnosis not present

## 2020-06-15 DIAGNOSIS — D125 Benign neoplasm of sigmoid colon: Secondary | ICD-10-CM

## 2020-06-15 DIAGNOSIS — G4730 Sleep apnea, unspecified: Secondary | ICD-10-CM | POA: Diagnosis not present

## 2020-06-15 DIAGNOSIS — K625 Hemorrhage of anus and rectum: Secondary | ICD-10-CM | POA: Diagnosis not present

## 2020-06-15 DIAGNOSIS — K3189 Other diseases of stomach and duodenum: Secondary | ICD-10-CM | POA: Diagnosis not present

## 2020-06-15 DIAGNOSIS — K644 Residual hemorrhoidal skin tags: Secondary | ICD-10-CM | POA: Diagnosis not present

## 2020-06-15 DIAGNOSIS — I10 Essential (primary) hypertension: Secondary | ICD-10-CM | POA: Diagnosis not present

## 2020-06-15 HISTORY — PX: ESOPHAGEAL DILATION: SHX303

## 2020-06-15 HISTORY — PX: COLONOSCOPY: SHX5424

## 2020-06-15 HISTORY — PX: ESOPHAGOGASTRODUODENOSCOPY: SHX5428

## 2020-06-15 LAB — CBC
HCT: 36.5 % (ref 36.0–46.0)
Hemoglobin: 12.1 g/dL (ref 12.0–15.0)
MCH: 32.3 pg (ref 26.0–34.0)
MCHC: 33.2 g/dL (ref 30.0–36.0)
MCV: 97.3 fL (ref 80.0–100.0)
Platelets: 286 10*3/uL (ref 150–400)
RBC: 3.75 MIL/uL — ABNORMAL LOW (ref 3.87–5.11)
RDW: 12.2 % (ref 11.5–15.5)
WBC: 9.2 10*3/uL (ref 4.0–10.5)
nRBC: 0 % (ref 0.0–0.2)

## 2020-06-15 LAB — BASIC METABOLIC PANEL
Anion gap: 6 (ref 5–15)
BUN: 6 mg/dL (ref 6–20)
CO2: 23 mmol/L (ref 22–32)
Calcium: 8.6 mg/dL — ABNORMAL LOW (ref 8.9–10.3)
Chloride: 101 mmol/L (ref 98–111)
Creatinine, Ser: 0.56 mg/dL (ref 0.44–1.00)
GFR, Estimated: >60 mL/min (ref >60)
Glucose, Bld: 100 mg/dL — ABNORMAL HIGH (ref 70–99)
Potassium: 3.9 mmol/L (ref 3.5–5.1)
Sodium: 130 mmol/L — ABNORMAL LOW (ref 135–145)

## 2020-06-15 LAB — HIV ANTIBODY (ROUTINE TESTING W REFLEX): HIV Screen 4th Generation wRfx: NONREACTIVE

## 2020-06-15 LAB — MAGNESIUM: Magnesium: 2 mg/dL (ref 1.7–2.4)

## 2020-06-15 SURGERY — EGD (ESOPHAGOGASTRODUODENOSCOPY)
Anesthesia: General

## 2020-06-15 SURGERY — ESOPHAGOGASTRODUODENOSCOPY (EGD) WITH PROPOFOL
Anesthesia: Monitor Anesthesia Care

## 2020-06-15 MED ORDER — PANTOPRAZOLE SODIUM 40 MG PO TBEC: 40.0000 mg | DELAYED_RELEASE_TABLET | Freq: Every day | ORAL | 1 refills | Status: DC

## 2020-06-15 MED ORDER — LIDOCAINE VISCOUS HCL 2 % MT SOLN
OROMUCOSAL | Status: AC
  Filled 2020-06-15: qty 15

## 2020-06-15 MED ORDER — GLYCOPYRROLATE 0.2 MG/ML IJ SOLN
0.2000 mg | Freq: Once | INTRAMUSCULAR | Status: AC
  Administered 2020-06-15: 0.2 mg via INTRAVENOUS

## 2020-06-15 MED ORDER — PROPOFOL 10 MG/ML IV BOLUS
INTRAVENOUS | Status: DC | PRN
  Administered 2020-06-15: 50 mg via INTRAVENOUS
  Administered 2020-06-15: 100 mg via INTRAVENOUS

## 2020-06-15 MED ORDER — ACETAMINOPHEN 325 MG PO TABS
650.0000 mg | ORAL_TABLET | Freq: Four times a day (QID) | ORAL | 0 refills | Status: DC | PRN
Start: 2020-06-15 — End: 2020-06-22

## 2020-06-15 MED ORDER — GLYCOPYRROLATE 0.2 MG/ML IJ SOLN
INTRAMUSCULAR | Status: AC
  Filled 2020-06-15: qty 1

## 2020-06-15 MED ORDER — MAGNESIUM CITRATE PO SOLN
0.5000 | Freq: Once | ORAL | Status: AC
  Administered 2020-06-15: 0.5 via ORAL
  Filled 2020-06-15: qty 296

## 2020-06-15 MED ORDER — PROPOFOL 500 MG/50ML IV EMUL
INTRAVENOUS | Status: DC | PRN
  Administered 2020-06-15: 150 ug/kg/min via INTRAVENOUS

## 2020-06-15 MED ORDER — THIAMINE HCL 100 MG PO TABS
100.0000 mg | ORAL_TABLET | Freq: Every day | ORAL | 1 refills | Status: DC
Start: 2020-06-16 — End: 2020-12-12

## 2020-06-15 MED ORDER — LACTATED RINGERS IV SOLN: INTRAVENOUS | Status: DC | PRN

## 2020-06-15 MED ORDER — LIDOCAINE HCL (CARDIAC) PF 100 MG/5ML IV SOSY
PREFILLED_SYRINGE | INTRAVENOUS | Status: DC | PRN
  Administered 2020-06-15: 50 mg via INTRAVENOUS

## 2020-06-15 MED ORDER — LIDOCAINE VISCOUS HCL 2 % MT SOLN
15.0000 mL | Freq: Once | OROMUCOSAL | Status: AC
  Administered 2020-06-15: 15 mL via OROMUCOSAL

## 2020-06-15 MED ORDER — LACTATED RINGERS IV SOLN: Freq: Once | INTRAVENOUS | Status: AC

## 2020-06-15 MED ORDER — STERILE WATER FOR IRRIGATION IR SOLN
Status: DC | PRN
  Administered 2020-06-15: 500 mL

## 2020-06-15 NOTE — Transfer of Care (Signed)
Immediate Anesthesia Transfer of Care Note  Patient: Regina Richardson  Procedure(s) Performed: ESOPHAGOGASTRODUODENOSCOPY (EGD) (N/A ) ESOPHAGEAL DILATION (N/A ) COLONOSCOPY (N/A )  Patient Location: PACU  Anesthesia Type:General  Level of Consciousness: drowsy  Airway & Oxygen Therapy: Patient Spontanous Breathing and Patient connected to nasal cannula oxygen  Post-op Assessment: Report given to RN and Post -op Vital signs reviewed and stable  Post vital signs: Reviewed and stable   Last Vitals:  Vitals Value Taken Time  BP 113/45 06/15/20 1330  Temp    Pulse 69 06/15/20 1332  Resp 21 06/15/20 1332  SpO2 98 % 06/15/20 1332  Vitals shown include unvalidated device data.  Last Pain:  Vitals:   06/15/20 1144  TempSrc: Oral  PainSc: 0-No pain      Patients Stated Pain Goal: 10 (70/48/88 9169)  Complications: No complications documented.

## 2020-06-15 NOTE — Progress Notes (Addendum)
Brief EGD/ED and colonoscopy notes  Esophageal web and distal esophageal stricture. Esophagus dilated by passing 66 French Maloney dilator resulting in mucosal disruption at proximal  and distal esophagus and GE junction Focal esophagitis just below upper esophageal sphincter.  Biopsy taken post esophageal dilation. Mild portal hypertensive gastropathy at fundus and body. Examination of first and second part of duodenum.  3 small polyps removed and submitted in one container. Polyp from distal sigmoid colon was cold biopsied and polyps from splenic flexure and sigmoid colon were cold snared. Small external hemorrhoids.

## 2020-06-15 NOTE — Progress Notes (Signed)
GI Inpatient Follow-up Note  Subjective: patient states she is hungry. Had two yellow stools yesterday evening but no BM this morning.   Chronically has dysphagia - foods and pills - worse in upper esophageal area, has some chronic GERD symptoms depending on diet. Denies n/v. Unsure regarding recent weights. Does endorse some chronic bloating.   She reports having a BM q3 days, uses miralax PRN. She reports miralax as needed works well. She has some chronic rectal bleeding over past few months. Occasional rectal pain as well.  Smokes 1/2 PPD. Denies NSAIDS. 4-5 beers per day  08/2017-- LA Grade B reflux esophagitis. Biopsied. - Gastritis. Biopsied. - Normal duodenal bulb and second portion of the duodenum. Biopsied.  08/2017-Prominent and lipomatous ileocecal valve. - One 9 mm polyp at the hepatic flexure, removed with a hot snare. Resected and retrieved. - One 8 mm polyp in the descending colon, removed with a cold snare. Resected and retrieved. - The examination was otherwise normal on direct and retroflexion views  1. Surgical [P], hepatic flexure, descending polyp (2) - SESSILE SERRATED POLYP WITHOUT CYTOLOGIC DYSPLASIA. - HYPERPLASTIC POLYP(S). - THERE IS NO EVIDENCE OF MALIGNANCY. 2. Surgical [P], duodenum - BENIGN SMALL BOWEL MUCOSA. - NO ACTIVE INFLAMMATION OR VILLOUS ATROPHY IDENTIFIED. 3. Surgical [P], gastric antrum and gastric body - CHRONIC INACTIVE GASTRITIS. - THERE IS NO EVIDENCE OF HELICOBACTER PYLORI, DYSPLASIA, OR MALIGNANCY. - SEE COMMENT. 4. Surgical [P], distal esophagus - MILDLY INFLAMED SQUAMOUS MUCOSA. - THERE IS NO EVIDENCE OF CANDIDA ORGANISMS, GOBLET CELL METAPLASIA, DYSPLASIA, OR MALIGNANCY. - SEE COMMENT.    Scheduled Inpatient Medications:  . allopurinol  300 mg Oral Daily  . buPROPion  150 mg Oral BID  . clonazePAM  2 mg Oral QHS  . DULoxetine  60 mg Oral Daily  . folic acid  1 mg Oral Daily  . metoprolol succinate  50 mg Oral QPC  breakfast  . multivitamin with minerals  1 tablet Oral Daily  . pantoprazole (PROTONIX) IV  40 mg Intravenous Q24H  . pneumococcal 23 valent vaccine  0.5 mL Intramuscular Tomorrow-1000  . thiamine  100 mg Oral Daily   Or  . thiamine  100 mg Intravenous Daily    Continuous Inpatient Infusions:   . sodium chloride 75 mL/hr at 06/15/20 0408    PRN Inpatient Medications:  acetaminophen **OR** acetaminophen, LORazepam **OR** LORazepam, ondansetron **OR** ondansetron (ZOFRAN) IV  Review of Systems: Constitutional: Weight is stable.  Eyes: No changes in vision. ENT: No oral lesions, sore throat.  GI: see HPI.  Heme/Lymph: No easy bruising.  CV: No chest pain.  GU: No hematuria.  Integumentary: No rashes.  Neuro: No headaches.  Psych: No depression/anxiety.  Endocrine: No heat/cold intolerance.  Allergic/Immunologic: No urticaria.  Resp: No cough, SOB.  Musculoskeletal: No joint swelling.    Physical Examination: BP (!) 152/53 (BP Location: Right Arm)   Pulse 62   Temp 97.7 F (36.5 C)   Resp 19   Ht 5' (1.524 m)   Wt 72.6 kg   SpO2 95%   BMI 31.25 kg/m  Gen: NAD, alert and oriented x 4 HEENT: PEERLA, EOMI, Neck: supple, no JVD or thyromegaly Chest: CTA bilaterally, no wheezes, crackles, or other adventitious sounds CV: RRR, no m/g/c/r Abd: soft, NT, ND, +BS in all four quadrants; no HSM, guarding, ridigity, or rebound tenderness Ext: no edema, well perfused with 2+ pulses, Skin: no rash or lesions noted Lymph: no LAD  Data: Lab Results  Component Value Date  WBC 9.2 06/15/2020   HGB 12.1 06/15/2020   HCT 36.5 06/15/2020   MCV 97.3 06/15/2020   PLT 286 06/15/2020   Recent Labs  Lab 06/14/20 0727 06/14/20 1205 06/15/20 0547  HGB 15.6* 12.8 12.1   Lab Results  Component Value Date   NA 130 (L) 06/15/2020   K 3.9 06/15/2020   CL 101 06/15/2020   CO2 23 06/15/2020   BUN 6 06/15/2020   CREATININE 0.56 06/15/2020   Lab Results  Component Value Date    ALT 23 06/10/2020   AST 52 (H) 06/10/2020   GGT 870 (HH) 03/24/2020   ALKPHOS 124 06/10/2020   BILITOT 0.6 06/10/2020   Recent Labs  Lab 06/14/20 1236  INR 1.2    Outpatient [place] 04/2020  IMPRESSION: 1. No acute abnormality. 2. Gallbladder sludge. 3. Cirrhosis.   Assessment/Plan: Ms. Corti is a 59 y.o. female w/ PMHx alcohol abuse with beer (reports 4-5 beers daily), hypertension, anxiety/depression, insomnia, esophagitis/GERD, rectal bleeding, osteoarthritis, and cirrhosis admitted yesterday after having labs prior to elective endoscopy colonoscopy demonstrating low sodium of 123.   1.  Hyponatremia-sodium is 123 on admission, has improved to 130 this morning, chronic alcohol abuse. Mag also repleted.   2. Dysphagia - solids and pills, more upper esophageal area, EGD + possible ED planned for evaluation.   3. Cirrhosis - seen on recent imaging, negative work up for [facility], iron panel normal, negative viral hepatitis serologies, negative autoimmune labs, likely related to alcohol. Reports fam hx of alcoholic cirrhosis in mother and sister. No asterixis but consider checking ammonia as outpatient. UTD on AFP. No ascites on [place] last month.   4. Rectal bleeding - may be anal outlet. Last colonoscopy 2019. Repeat colonoscopy this afternoon.   Recommendations: 1/2 bottle mag citrate now then NPO for EGD/colonsocopy+possible dilation later today. Denies prior issues w/ sedation. R/b/A of procedures reviewed. All questions answered.   Case discussed w/ Dr Laural Golden    Ronney Asters, [facility]

## 2020-06-15 NOTE — Op Note (Signed)
[facility] Patient Name: Regina Richardson Procedure Date: 06/15/2020 1:03 PM MRN: [id number] Date of Birth: [birth year] Attending MD: Hildred Laser , MD CSN: [id number] Age: 59 Admit Type: Inpatient Procedure:                Colonoscopy Indications:              Rectal bleeding Providers:                Hildred Laser, MD, Lurline Del, RN, Raphael Gibney,                            Technician Referring MD:             Barton Dubois, MD Medicines:                Propofol per Anesthesia Complications:            No immediate complications. Estimated Blood Loss:     Estimated blood loss was minimal. Procedure:                Pre-Anesthesia Assessment:                           - Prior to the procedure, a History and Physical                            was performed, and patient medications and                            allergies were reviewed. The patient's tolerance of                            previous anesthesia was also reviewed. The risks                            and benefits of the procedure and the sedation                            options and risks were discussed with the patient.                            All questions were answered, and informed consent                            was obtained. Prior Anticoagulants: The patient has                            taken no previous anticoagulant or antiplatelet                            agents except for NSAID medication. ASA Grade                            Assessment: III - A patient with severe systemic  disease. After reviewing the risks and benefits,                            the patient was deemed in satisfactory condition to                            undergo the procedure.                           After obtaining informed consent, the colonoscope                            was passed under direct vision. Throughout the                            procedure, the patient's blood pressure,  pulse, and                            oxygen saturations were monitored continuously. The                            [id number]-[id number] ([id number]) was introduced through the                            anus and advanced to the the cecum, identified by                            appendiceal orifice and ileocecal valve. The                            colonoscopy was performed without difficulty. The                            patient tolerated the procedure well. The quality                            of the bowel preparation was good. The ileocecal                            valve, appendiceal orifice, and rectum were                            photographed. Scope In: 1:04:22 PM Scope Out: 1:26:48 PM Scope Withdrawal Time: 0 hours 13 minutes 24 seconds  Total Procedure Duration: 0 hours 22 minutes 26 seconds  Findings:      The perianal and digital rectal examinations were normal.      Two polyps were found in the sigmoid colon and splenic flexure. The       polyps were small in size. These polyps were removed with a cold snare.       Resection and retrieval were complete. The pathology specimen was placed       into Bottle Number 2.      A small polyp was found in the distal sigmoid colon. The polyp was       sessile.  Biopsies were taken with a cold forceps for histology. The       pathology specimen was placed into Bottle Number 2.      External hemorrhoids were found during retroflexion. The hemorrhoids       were small. Impression:               - Two small polyps in the sigmoid colon and at the                            splenic flexure, removed with a cold snare.                            Resected and retrieved.                           - One small polyp in the distal sigmoid colon.                            Biopsied.                           - External hemorrhoids. Moderate Sedation:      Per Anesthesia Care Recommendation:           - Return patient to hospital ward for ongoing  care.                           - Mechanical soft diet today.                           - Continue present medications.                           - No aspirin, ibuprofen, naproxen, or other                            non-steroidal anti-inflammatory drugs for 3 days.                           - Await pathology results.                           - Repeat colonoscopy in 5 years for surveillance. Procedure Code(s):        --- Professional ---                           [phone], Colonoscopy, flexible; with removal of                            tumor(s), polyp(s), or other lesion(s) by snare                            technique                           47425, 50, Colonoscopy, flexible; with biopsy,  single or multiple Diagnosis Code(s):        --- Professional ---                           K63.5, Polyp of colon                           K64.4, Residual hemorrhoidal skin tags                           K62.5, Hemorrhage of anus and rectum CPT copyright 2019 [employer]. All rights reserved. The codes documented in this report are preliminary and upon coder review may  be revised to meet current compliance requirements. Hildred Laser, MD Hildred Laser, MD 06/15/2020 1:59:19 PM This report has been signed electronically. Number of Addenda: 0

## 2020-06-15 NOTE — Discharge Summary (Signed)
Physician Discharge Summary  Regina Richardson QPY:[id number] DOB: 1960-09-30 DOA: 06/14/2020  PCP: Morsch Brooklyn, FNP  Admit date: 06/14/2020 Discharge date: 06/15/2020  Time spent: 35 minutes  Recommendations for Outpatient Follow-up:  1. Repeat basic metabolic panel and magnesium level 2. Repeat CBC to follow hemoglobin trend 3. Continue assisting patient with tobacco and alcohol cessation.   Discharge Diagnoses:  Active Problems:   Depression with anxiety   Hyponatremia Hypertension Gastroesophageal reflux disease/esophagitis/dysphagia Recent rectal bleeding History of gout/osteoarthritis History of alcohol abuse History of tobacco abuse Mild hypomagnesemia  Discharge Condition: Stable and improved.  Discharged home with directions to follow-up with PCP and gastroenterology as an outpatient.  CODE STATUS: Full code.  Diet recommendation: Heart healthy/soft diet.  Filed Weights   06/14/20 0739  Weight: 72.6 kg    History of present illness:  As per H&P written by Dr. Manuella Ghazi on 06/14/2020 Regina Richardson is a 59 y.o. female with medical history significant for ongoing alcohol abuse with beer, hypertension, anxiety/depression, insomnia, esophagitis/GERD, rectal bleeding, osteoarthritis, and liver cirrhosis secondary to alcohol use who was scheduled for elective EGD with dilation as well as colonoscopy today and was noted to have a sodium level of 123.  She has been having some mild weakness and dizziness in the last few days and states that she has not eaten in 2 days.  She denies any nausea or vomiting and continues to have a persistent/chronic epigastric abdominal pain.  She was recently noted to have some rectal bleeding as well, but this has improved considerably in the last few days.  Her GI studies were ordered in order to further assess the cause for her rectal bleeding as well as to perform dilation for dysphagia.  She denies any fevers, chills, chest pain, or shortness of  breath.  She states that she does drink about 4-5 beers on a daily basis and smokes 1/2 pack/day.  She denies any other drug use.  Hospital Course:  1-acute on chronic hyponatremia -In the setting of beer Poto mania -Improved with fluid resuscitation -At discharge patient sodium level of 130 -Normal TSH. -Patient was asymptomatic. -Alcohol cessation, proper hydration and nutrition recommended. -Repeat basic metabolic panel follow-up visit.  2-mild hypomagnesemia -Electrolytes were repleted -Repeat magnesium level at follow-up visit.  3-history of alcohol abuse -No signs of acute withdrawal appreciated -Patient will monitor under CIWA protocol -Cessation counseling provided -Thiamine and multivitamins recommended.  4-gastroesophageal reflux disease/esophagitis/dysphagia -In the setting of esophagitis and esophageal stricture -Status post endoscopy with dilatation -Continue the use of PPI -Avoid the use of NSAIDs and alcohol -Maintain adequate hydration -Follow soft diet.  5-history of depression and anxiety -No suicidal ideation hallucination -Resume antidepression and anxiolytic medications at discharge.  6-hypertension -Heart healthy diet has been encouraged -Continue the use of metoprolol.  7-history of gout/osteoarthritis -Patient instructed to avoid the use of NSAIDs -Continue the use of allopurinol.  8-tobacco abuse -Cessation counseling provided -Patient not ready to quit yet.  9-recent rectal bleeding as an outpatient -Is status post colonoscopy -Some polyps have been removed -GI planning to follow pathology reports and continue to follow patient after discharge.  Procedures: See report for endoscopy/colonoscopy findings.  Consultations:  Gastroenterology service  Discharge Exam: Vitals:   06/15/20 1400 06/15/20 1417  BP: (!) 153/72 (!) 171/76  Pulse: 65 65  Resp: 17 18  Temp:  98 F (36.7 C)  SpO2: 98% 99%    General: Afebrile, no chest  pain, no nausea vomiting.  Reports feeling  good and would like to go home. Cardiovascular: S1 and S2, no rubs, no gallops, no JVD. Respiratory: Clear to auscultation bilaterally, no requiring oxygen supplementation.  No using accessory muscle. Abdomen: Soft, positive bowel sounds, no tenderness on palpation. Extremities: No cyanosis or clubbing.  Discharge Instructions   Discharge Instructions    Discharge instructions   Complete by: As directed    Take medications as prescribed Maintain adequate hydration Stop the use of alcoholic beverage Arrange follow-up with PCP in 10 days Follow-up with gastroenterology service as instructed. Follow soft diet     Allergies as of 06/15/2020   No Known Allergies     Medication List    STOP taking these medications   hydrOXYzine 25 MG tablet Commonly known as: ATARAX/VISTARIL   meloxicam 7.5 MG tablet Commonly known as: MOBIC   Potassium Chloride ER 20 MEQ Tbcr     TAKE these medications   acetaminophen 325 MG tablet Commonly known as: TYLENOL Take 2 tablets (650 mg total) by mouth every 6 (six) hours as needed for mild pain or headache (or Fever >/= 101).   allopurinol 300 MG tablet Commonly known as: ZYLOPRIM Take 1 tablet (300 mg total) by mouth daily.   buPROPion 150 MG 12 hr tablet Commonly known as: ZYBAN   clonazePAM 2 MG tablet Commonly known as: KLONOPIN Take 2 mg by mouth at bedtime.   cyclobenzaprine 10 MG tablet Commonly known as: FLEXERIL TAKE 1TAB 3 TIMES A DAY AS NEEDED FOR MUSCLE SPASMS. IF CAUSING DROWSINESS TAKE 1TAB AT BEDTIME ONLY What changed: See the new instructions.   DULoxetine 60 MG capsule Commonly known as: CYMBALTA TAKE 1 CAPSULE BY MOUTH EVERY DAY   metoprolol succinate 50 MG 24 hr tablet Commonly known as: TOPROL-XL TAKE 1 TABLET BY MOUTH EVERY DAY WITH OR IMMEDIATELY FOLLOWING A MEAL What changed:   how much to take  how to take this  when to take this   pantoprazole 40 MG  tablet Commonly known as: PROTONIX Take 1 tablet (40 mg total) by mouth daily. What changed: when to take this   thiamine 100 MG tablet Take 1 tablet (100 mg total) by mouth daily. Start taking on: June 16, 2020      No Known Allergies  Follow-up Information    Bleicher Brooklyn, FNP. Schedule an appointment as soon as possible for a visit in 2 week(s).   Specialty: Family Medicine Contact information: [address] [place] [address] [phone]               The results of significant diagnostics from this hospitalization (including imaging, microbiology, ancillary and laboratory) are listed below for reference.    Significant Diagnostic Studies: No results found.  Microbiology: Recent Results (from the past 240 hour(s))  SARS CORONAVIRUS 2 (TAT 6-24 HRS) Nasopharyngeal Nasopharyngeal Swab     Status: None   Collection Time: 06/10/20  8:05 AM   Specimen: Nasopharyngeal Swab  Result Value Ref Range Status   SARS Coronavirus 2 NEGATIVE NEGATIVE Final    Comment: (NOTE) SARS-CoV-2 target nucleic acids are NOT DETECTED.  The SARS-CoV-2 RNA is generally detectable in upper and lower respiratory specimens during the acute phase of infection. Negative results do not preclude SARS-CoV-2 infection, do not rule out co-infections with other pathogens, and should not be used as the sole basis for treatment or other patient management decisions. Negative results must be combined with clinical observations, patient history, and epidemiological information. The expected result is Negative.  Fact Sheet for Patients: SugarRoll.be  Fact Sheet for Healthcare Providers: https://www.woods-mathews.com/  This test is not yet approved or cleared by the [place] FDA and  has been authorized for detection and/or diagnosis of SARS-CoV-2 by FDA under an Emergency Use Authorization (EUA). This EUA will remain  in effect (meaning  this test can be used) for the duration of the COVID-19 declaration under Se ction 564(b)(1) of the Act, 21 U.S.C. section 360bbb-3(b)(1), unless the authorization is terminated or revoked sooner.  Performed at [facility], [place] [address], [place], [place] [address]      Labs: Basic Metabolic Panel: Recent Labs  Lab 06/10/20 0819 06/14/20 0727 06/14/20 0751 06/14/20 1205 06/14/20 1904 06/15/20 0547  NA 128* 126* 123*  --  125* 130*  K 3.2* 3.6 3.6  --  3.5 3.9  CL 92* 87* 89*  --  98 101  CO2 26  --  24  --  21* 23  GLUCOSE 81 106* 106*  --  156* 100*  BUN <5* 9 9  --  7 6  CREATININE 0.49 0.60 0.66  --  0.62 0.56  CALCIUM 9.2  --  9.0  --  8.5* 8.6*  MG  --   --   --  1.6*  --  2.0  PHOS  --   --   --  3.8  --   --    Liver Function Tests: Recent Labs  Lab 06/10/20 0819  AST 52*  ALT 23  ALKPHOS 124  BILITOT 0.6  PROT 8.2*  ALBUMIN 3.7   CBC: Recent Labs  Lab 06/14/20 0727 06/14/20 1205 06/15/20 0547  WBC  --  11.0* 9.2  HGB 15.6* 12.8 12.1  HCT 46.0 38.2 36.5  MCV  --  96.2 97.3  PLT  --  279 286    Signed:  Barton Dubois MD.  [facility] 06/15/2020, 5:36 PM

## 2020-06-15 NOTE — TOC Initial Note (Signed)
Transition of Care [facility]) - Initial/Assessment Note    Patient Details  Name: Regina Richardson MRN: [id number] Date of Birth: January 30, 1961  Transition [facility] CM/SW Contact:    Salome Arnt, LCSW Phone Number: 06/15/2020, 9:13 AM  Clinical Narrative:  Pt admitted due to hyponatremia. Pt lives alone and reports she is independent with ADLs. She does not drive, but has family who will take her to appointments or uses [facility]. Pt plans to return home when medically stable. TOC consulted for substance abuse counseling. Pt reports she went to alcohol detox 2 weeks ago. She states her brother died just before this and his death was related to alcohol abuse, so she felt she needed to seek help. Both of pt's parents were alcoholics. Pt felt she was doing okay, but while watching football on Sunday she had 4 or 5 beers. She had outpatient treatment scheduled to start today which she will reschedule. Pt does not feel that she needs other resources at this time and will ask for other referrals at outpatient treatment if needed.                   Expected Discharge Plan: Home/Self Care Barriers to Discharge: Continued Medical Work up   Patient Goals and CMS Choice Patient states their goals for this hospitalization and ongoing recovery are:: return home      Expected Discharge Plan and Services Expected Discharge Plan: Home/Self Care In-house Referral: Clinical Social Work   Post Acute Care Choice: NA Living arrangements for the past 2 months: Single Family Home                 DME Arranged: N/A DME Agency: NA       HH Arranged: NA [place] Agency: NA        Prior Living Arrangements/Services Living arrangements for the past 2 months: [place]   Patient language and need for interpreter reviewed:: Yes Do you feel safe going back to the place where you live?: Yes      Need for Family Participation in Patient Care: No (Comment)     Criminal Activity/Legal Involvement  Pertinent to Current Situation/Hospitalization: No - Comment as needed  Activities of Daily Living Home Assistive Devices/Equipment: None ADL Screening (condition at time of admission) Patient's cognitive ability adequate to safely complete daily activities?: Yes Is the patient deaf or have difficulty hearing?: No Does the patient have difficulty seeing, even when wearing glasses/contacts?: No Does the patient have difficulty concentrating, remembering, or making decisions?: No Patient able to express need for assistance with ADLs?: Yes Does the patient have difficulty dressing or bathing?: No Independently performs ADLs?: Yes (appropriate for developmental age) Does the patient have difficulty walking or climbing stairs?: No Weakness of Legs: None Weakness of Arms/Hands: None  Permission Sought/Granted                  Emotional Assessment   Attitude/Demeanor/Rapport: Engaged Affect (typically observed): Appropriate Orientation: : Oriented to Self, Oriented to Place, Oriented to  Time, Oriented to Situation Alcohol / Substance Use: Alcohol Use Psych Involvement: No (comment)  Admission diagnosis:  Hyponatremia [E87.1] Patient Active Problem List   Diagnosis Date Noted  . Hyponatremia 06/14/2020  . Alcohol abuse 05/13/2020  . Gastroesophageal reflux disease 04/11/2020  . Dysphagia 04/11/2020  . Rectal bleeding 04/11/2020  . Elevated LFTs 04/11/2020  . Urgency of urination 03/08/2020  . Other intervertebral disc degeneration, lumbar region 05/08/2017  . Midline low back  pain 11/22/2016  . Scoliosis 11/22/2016  . Smoker 09/07/2015  . Essential hypertension 05/25/2015  . Insomnia 05/25/2015  . Anxiety and depression   . Depression    PCP:  Archibeque Brooklyn, FNP Pharmacy:   [id number] - [place], [address] [place] [address] Phone: [phone] Fax: [phone]     Social Determinants of Health (SDOH)  Interventions    Readmission Risk Interventions No flowsheet data found.

## 2020-06-15 NOTE — Anesthesia Postprocedure Evaluation (Signed)
Anesthesia Post Note  Patient: Regina Richardson  Procedure(s) Performed: ESOPHAGOGASTRODUODENOSCOPY (EGD) (N/A ) ESOPHAGEAL DILATION (N/A ) COLONOSCOPY (N/A )  Patient location during evaluation: PACU Anesthesia Type: General Level of consciousness: awake and alert and oriented Pain management: pain level controlled Respiratory status: spontaneous breathing, respiratory function stable and nonlabored ventilation Cardiovascular status: blood pressure returned to baseline and stable Postop Assessment: no apparent nausea or vomiting Anesthetic complications: no   No complications documented.   Last Vitals:  Vitals:   06/15/20 1330 06/15/20 1345  BP: (!) 113/45 134/84  Pulse: 69 67  Resp: 18 16  Temp:    SpO2: 97% 99%    Last Pain:  Vitals:   06/15/20 1345  TempSrc:   PainSc: 0-No pain                 Orlie Dakin

## 2020-06-15 NOTE — Anesthesia Procedure Notes (Signed)
Date/Time: 06/15/2020 12:49 PM Performed by: Orlie Dakin, CRNA Pre-anesthesia Checklist: Patient identified, Emergency Drugs available, Suction available and Patient being monitored Patient Re-evaluated:Patient Re-evaluated prior to induction Oxygen Delivery Method: Nasal cannula Induction Type: IV induction Placement Confirmation: positive ETCO2

## 2020-06-15 NOTE — Op Note (Addendum)
[facility] Patient Name: Regina Richardson Procedure Date: 06/15/2020 11:58 AM MRN: [id number] Date of Birth: 22-May-1961 Attending MD: Hildred Laser , MD CSN: [id number] Age: 59 Admit Type: Inpatient Procedure:                Upper GI endoscopy Indications:              Esophageal dysphagia Providers:                Hildred Laser, MD, Charlsie Quest. Insurance claims handler, Therapist, sports,                            Suzan Garibaldi. Risa Grill, Technician, Nelma Rothman,                            Merchant navy officer Referring MD:             Barton Dubois, MD Medicines:                Propofol per Anesthesia Complications:            No immediate complications. Estimated Blood Loss:     Estimated blood loss was minimal. Procedure:                Pre-Anesthesia Assessment:                           - Prior to the procedure, a History and Physical                            was performed, and patient medications and                            allergies were reviewed. The patient's tolerance of                            previous anesthesia was also reviewed. The risks                            and benefits of the procedure and the sedation                            options and risks were discussed with the patient.                            All questions were answered, and informed consent                            was obtained. Prior Anticoagulants: The patient has                            taken no previous anticoagulant or antiplatelet                            agents except for NSAID medication. ASA Grade  Assessment: III - A patient with severe systemic                            disease. After reviewing the risks and benefits,                            the patient was deemed in satisfactory condition to                            undergo the procedure.                           After obtaining informed consent, the endoscope was                            passed under direct vision.  Throughout the                            procedure, the patient's blood pressure, pulse, and                            oxygen saturations were monitored continuously. The                            GIF-H190 ([id number]) scope was introduced through the                            mouth, and advanced to the second part of duodenum.                            The upper GI endoscopy was accomplished without                            difficulty. The patient tolerated the procedure                            well. Scope In: 12:47:08 PM Scope Out: 42:68:34 PM Total Procedure Duration: 0 hours 11 minutes 10 seconds  Findings:      The hypopharynx was normal.      A web was found in the proximal esophagus.      Focal erythema and erosions with no bleeding was found 22 cm from the       incisors; possible pill esophagitis, This was biopsied with a cold       forceps for histology. The pathology specimen was placed into Bottle       Number 1.      One benign-appearing, intrinsic mild stenosis was found 38 cm from the       incisors. The stenosis was traversed. The scope was withdrawn. Dilation       was performed with a Maloney dilator with mild resistance at 53 Fr. The       dilation site was examined following endoscope reinsertion and showed       moderate mucosal disruption, moderate improvement in luminal narrowing       and no perforation.      Mild  portal hypertensive gastropathy was found in the gastric fundus and       in the gastric body.      The exam of the stomach was otherwise normal.      The duodenal bulb and second portion of the duodenum were normal. Impression:               - Normal hypopharynx.                           - Web in the proximal esophagus. Dilated                           - focal esophagits at proximal esophagus;possible                            pill esophagitis. Biopsied                           - Benign-appearing esophageal stenosis. Dilated.                            - Portal hypertensive gastropathy.                           - Normal duodenal bulb and second portion of the                            duodenum. Moderate Sedation:      Per Anesthesia Care Recommendation:           - Return patient to hospital ward for ongoing care.                           - Resume previous diet today.                           - Continue present medications.                           - No aspirin, ibuprofen, naproxen, or other                            non-steroidal anti-inflammatory drugs for 3 days.                           - Await pathology results. Procedure Code(s):        --- Professional ---                           [phone], Esophagogastroduodenoscopy, flexible,                            transoral; with biopsy, single or multiple                           [id number], Dilation of esophagus, by unguided sound or  bougie, single or multiple passes Diagnosis Code(s):        --- Professional ---                           Q39.4, Esophageal web                           K20.90, Esophagitis, unspecified without bleeding                           K22.2, Esophageal obstruction                           K76.6, Portal hypertension                           K31.89, Other diseases of stomach and duodenum                           R13.14, Dysphagia, pharyngoesophageal phase CPT copyright 2019 American Medical Association. All rights reserved. The codes documented in this report are preliminary and upon coder review may  be revised to meet current compliance requirements. Hildred Laser, MD Hildred Laser, MD 06/15/2020 1:53:49 PM This report has been signed electronically. Number of Addenda: 0

## 2020-06-15 NOTE — Anesthesia Preprocedure Evaluation (Addendum)
Anesthesia Evaluation  Patient identified by MRN, date of birth, ID band Patient awake    Reviewed: Allergy & Precautions, NPO status , Patient's Chart, lab work & pertinent test results, reviewed documented beta blocker date and time   Airway Mallampati: III  TM Distance: >3 FB Neck ROM: Full    Dental  (+) Dental Advisory Given, Missing Crowns :   Pulmonary sleep apnea , Current Smoker and Patient abstained from smoking.,    breath sounds clear to auscultation       Cardiovascular METS: 3 - Mets hypertension, Pt. on medications and Pt. on home beta blockers Normal cardiovascular exam Rhythm:Regular Rate:Normal - Systolic murmurs, - Diastolic murmurs, - Friction Rub, - Carotid Bruit, - Peripheral Edema and - Systolic Click [id number] 78:58:85 [place] System-AP-OPS ROUTINE RECORD Sinus bradycardia with 1st degree A-V block Low voltage QRS Left posterior fascicular block Prolonged QT Abnormal ECG No previous tracing Confirmed by Cristopher Peru [phone]) on 06/10/2020 8:28:04 PM 34mm/s 69mm/mV 100Hz  9.0.4 12SL 243 CID: [id number] Referred by: Hendricks Limes Confirmed By: Cristopher Peru   Neuro/Psych PSYCHIATRIC DISORDERS Anxiety Depression    GI/Hepatic GERD  Medicated and Controlled,(+)     substance abuse (h/o alcohol abuse)  alcohol use,   Endo/Other    Renal/GU negative Renal ROS     Musculoskeletal  (+) Arthritis ,   Abdominal   Peds  Hematology   Anesthesia Other Findings Hyponatremia - 130 today  Reproductive/Obstetrics                            Anesthesia Physical Anesthesia Plan  ASA: III  Anesthesia Plan: General   Post-op Pain Management:    Induction: Intravenous  PONV Risk Score and Plan: TIVA  Airway Management Planned: Nasal Cannula and Natural Airway  Additional Equipment:   Intra-op Plan:   Post-operative Plan:   Informed Consent:     Dental advisory  given  Plan Discussed with: CRNA and Surgeon  Anesthesia Plan Comments:         Anesthesia Quick Evaluation

## 2020-06-20 ENCOUNTER — Ambulatory Visit: Payer: Medicare HMO | Admitting: Family Medicine

## 2020-06-20 LAB — SURGICAL PATHOLOGY

## 2020-06-22 ENCOUNTER — Ambulatory Visit (INDEPENDENT_AMBULATORY_CARE_PROVIDER_SITE_OTHER): Payer: Medicare HMO | Admitting: Family Medicine

## 2020-06-22 ENCOUNTER — Encounter: Payer: Self-pay | Admitting: Family Medicine

## 2020-06-22 ENCOUNTER — Ambulatory Visit: Payer: Medicare HMO | Admitting: Family Medicine

## 2020-06-22 DIAGNOSIS — F418 Other specified anxiety disorders: Secondary | ICD-10-CM | POA: Diagnosis not present

## 2020-06-22 MED ORDER — QUETIAPINE FUMARATE 50 MG PO TABS: 50.0000 mg | ORAL_TABLET | Freq: Every day | ORAL | 1 refills | Status: DC

## 2020-06-22 NOTE — Progress Notes (Signed)
Virtual Visit via telephone Note  I connected with Regina Richardson on 06/22/20 at 1553 by telephone and verified that I am speaking with the correct person using two identifiers. Regina Richardson is currently located at home and patient are currently with her during visit. The provider, Fransisca Kaufmann Raffaela Ladley, MD is located in their office at time of visit.  Call ended at 1605  I discussed the limitations, risks, security and privacy concerns of performing an evaluation and management service by telephone and the availability of in person appointments. I also discussed with the patient that there may be a patient responsible charge related to this service. The patient expressed understanding and agreed to proceed.   History and Present Illness: Patient is calling in for anger problems that caused her to drink.  She went to inpatient rehab and is now doing outpatient rehab.  She has a lot of anger problems and having a lot of problems sleeping. She is trying to quit drinking.  She is drinking 1-2 beer per day. She denies any suicidal ideations  No diagnosis found.  Outpatient Encounter Medications as of 06/22/2020  Medication Sig  . acetaminophen (TYLENOL) 325 MG tablet Take 2 tablets (650 mg total) by mouth every 6 (six) hours as needed for mild pain or headache (or Fever >/= 101).  Marland Kitchen allopurinol (ZYLOPRIM) 300 MG tablet Take 1 tablet (300 mg total) by mouth daily.  Marland Kitchen buPROPion (ZYBAN) 150 MG 12 hr tablet   . clonazePAM (KLONOPIN) 2 MG tablet Take 2 mg by mouth at bedtime.   . cyclobenzaprine (FLEXERIL) 10 MG tablet TAKE 1TAB 3 TIMES A DAY AS NEEDED FOR MUSCLE SPASMS. IF CAUSING DROWSINESS TAKE 1TAB AT BEDTIME ONLY (Patient taking differently: Take 10 mg by mouth 3 (three) times daily as needed (spasms.). )  . DULoxetine (CYMBALTA) 60 MG capsule TAKE 1 CAPSULE BY MOUTH EVERY DAY  . metoprolol succinate (TOPROL-XL) 50 MG 24 hr tablet TAKE 1 TABLET BY MOUTH EVERY DAY WITH OR IMMEDIATELY FOLLOWING A  MEAL (Patient taking differently: Take 50 mg by mouth daily after breakfast. TAKE 1 TABLET BY MOUTH EVERY DAY WITH OR IMMEDIATELY FOLLOWING A MEAL)  . pantoprazole (PROTONIX) 40 MG tablet Take 1 tablet (40 mg total) by mouth daily.  Marland Kitchen thiamine 100 MG tablet Take 1 tablet (100 mg total) by mouth daily.   No facility-administered encounter medications on file as of 06/22/2020.    Review of Systems  Constitutional: Negative for chills and fever.  Eyes: Negative for visual disturbance.  Respiratory: Negative for chest tightness and shortness of breath.   Cardiovascular: Negative for chest pain and leg swelling.  Musculoskeletal: Negative for back pain and gait problem.  Skin: Negative for rash.  Neurological: Negative for light-headedness and headaches.  Psychiatric/Behavioral: Positive for dysphoric mood and sleep disturbance. Negative for agitation, behavioral problems, self-injury and suicidal ideas. The patient is nervous/anxious.   All other systems reviewed and are negative.   Observations/Objective: Patient sounds comfortable and in no acute distress  Assessment and Plan: Problem List Items Addressed This Visit      Other   Depression with anxiety - Primary   Relevant Medications   QUEtiapine (SEROQUEL) 50 MG tablet      Will try seroquel and see how it goes it should help with both sleep and calm her anxiety and anger down.  She is already taking Cymbalta and bupropion so did not want to add another antidepressant. Follow up plan: Return in about 2  months (around 08/23/2020), or if symptoms worsen or fail to improve, for depression.     I discussed the assessment and treatment plan with the patient. The patient was provided an opportunity to ask questions and all were answered. The patient agreed with the plan and demonstrated an understanding of the instructions.   The patient was advised to call back or seek an in-person evaluation if the symptoms worsen or if the condition  fails to improve as anticipated.  The above assessment and management plan was discussed with the patient. The patient verbalized understanding of and has agreed to the management plan. Patient is aware to call the clinic if symptoms persist or worsen. Patient is aware when to return to the clinic for a follow-up visit. Patient educated on when it is appropriate to go to the emergency department.    I provided 12 minutes of non-face-to-face time during this encounter.    Worthy Rancher, MD

## 2020-06-24 ENCOUNTER — Ambulatory Visit: Payer: Medicare HMO | Admitting: Family Medicine

## 2020-06-28 ENCOUNTER — Other Ambulatory Visit (INDEPENDENT_AMBULATORY_CARE_PROVIDER_SITE_OTHER): Payer: Self-pay | Admitting: *Deleted

## 2020-07-06 NOTE — Progress Notes (Signed)
Referring Provider: Tompson Brooklyn, FNP Primary Care Physician:  Machorro Brooklyn, FNP Primary GI Physician: Dr. Abbey Chatters  Chief Complaint  Patient presents with  . Follow-up  . Gastroesophageal Reflux    HPI:   Regina Richardson is a 59 y.o. female presenting today for follow-up of dysphagia, GERD, rectal bleeding, and elevated LFTs, recently found to have cirrhosis on ultrasound in October 2021. History of H. pylori in 2019.  Patient was last seen in our office 04/11/2020 at the time of initial consult.  She reported painless bright red blood per rectum that began 1 month prior and was intermittent for 3 weeks.  BMs at baseline with no constipation or diarrhea.  Noted hematuria and pain in the lower abdomen/pelvic area when urinating.  She is seeing urology with plans for CT.  GERD uncontrolled on omeprazole daily.  Did not remember being treated for H. Pylori. Also with solid food and pill dysphagia daily.  She is taking meloxicam daily and 325 mg aspirin 4-5 times a week.  Admitted to drinking 24, 12 ounce beer a week at baseline.  Felt her abdomen was swollen and tight x3 years.  No other signs/symptoms of decompensated liver disease.  Admitted trying cocaine marijuana in the 60s and 70s.  No OTC supplements.  Plan to stop omeprazole and start Protonix 40 mg daily, counseled on GERD diet/lifestyle, counseled on alcohol cessation, advised to limit all NSAIDs as much as possible, proceed with EGD/ED and colonoscopy, ultrasound abdomen, and labs to evaluate for elevated LFTs.  Prior labs with PCP with negative acute hepatitis panel.  GGT 870 (H)  Labs completed 04/26/2020: Alk phos 325, AST 101, ALT 48, ferritin 250 (H), iron and saturation  within normal limits, INR 1.1, no immunity to hepatitis B or hepatitis A.  Abdominal ultrasound 04/26/2020: Evidence of cirrhosis, normal spleen gallbladder sludge.  No ascites.  Additional labs 05/20/2020: Hemochromatosis DNA negative for C282Y and H63D,  ferritin returned to normal at 91, alk phos 195, AST 66, ALT 31, ASMA negative, ANA negative, ANA negative, IgG elevated at 1702, IgA elevated at 783.  Suspected IgA and IgG elevation may be secondary to alcohol.  Advise she continue to work towards alcohol cessation.  Preop labs 06/10/2020: AFP 3.5, alk phos 124, ALT 23, AST 52.  Notably, potassium 3.2 and sodium 128.  She was given potassium supplementation.  She presented for procedures 11/23, but sodium worse at 123.  She was admitted to the hospital for correction of hyponatremia.   Procedures 06/15/2020 performed by Dr. Laural Golden: Colonoscopy: 2 polyps resected and retrieved, 1 small polyp in the distal sigmoid biopsy, external hemorrhoids.  Pathology with 1 tubular adenoma and 1 hyperplastic polyp.  EGD: Proximal esophageal web, focal esophagitis at proximal esophagus s/p biopsy, benign-appearing esophageal stenosis s/p dilated, portal hypertensive gastropathy.  Esophageal biopsy with moderate squamous dysplasia.  Dr. Laural Golden recommended repeat EGD in about 8 weeks.  Today:  Dysphagia: Continues to feel foods are getting hung in her throat. Mild improvement since dilation.   GERD: Not taking Protonix.  States she was told to discontinue this after her EGD.  Per Dr.  Olevia Perches recommendations on EGD report, patient was to continue her current medications.  When she was taking Protonix, GERD was well controlled.   Rectal bleeding: None. Had constipation last week. Took 2 dulcolax and since then, she has been having diarrhea.  Currently with 4-5 watery BMs daily.  States she was having more redness initially, but cannot  tell me how many bowel movements she was having.  No nocturnal BMs.  No history of diarrhea. Prior to this, bowels were soft and formed daily.  Occasional cramping prior to BMs. BMs are after eating within 10 to 15 minutes. No sick contacts. No antibiotics. Brief hospitalization in November due to hyponatremia. No well water. Ice cream  nightly but this is not new. No fried/fatty foods. Not taking anything for diarrhea.   Cirrhosis/elevated LFTs: MELD 8 based on labs in November. Abdomen stays distended. No change since her last visit. No swelling in LE. No confusion or yellowing of eyes. Drinking 2-3 beer at night to help her sleep. Went to [facility] then went inpatient rehab in [place]. Quit drinking for a brief time. States she has trouble with depression and isn't getting much help from PCP. Would like referral.   Trying to lose weight. Focusing on eating more fruits and vegetables.   Past Medical History:  Diagnosis Date  . Alcoholism ([place])   . Anxiety   . Arrhythmia    heart  . Arthritis   . Cancer ([place])    cervical cancer in the 80s  . Cirrhosis ([place]) 63/8756   alcoholic;   . Depression   . GERD (gastroesophageal reflux disease)   . Hypertension   . Osteoporosis   . Sleep apnea    no CPAP  . Urinary incontinence     Past Surgical History:  Procedure Laterality Date  . ABDOMINAL HYSTERECTOMY     still has ovaries  . CERVICAL BIOPSY  W/ LOOP ELECTRODE EXCISION    . CESAREAN SECTION    . COLONOSCOPY  08/2017   Dr. Fuller Plan;  prominent and moderately lipomatous ileocecal valve, 9 mm sessile polyp in hepatic flexure, 8 mm sessile polyp in the descending colon, otherwise normal exam.  Pathology with sessile serrated polyp and hyperplastic polyp.  Recommended repeat colonoscopy in 5 years.  . COLONOSCOPY N/A 06/15/2020   Procedure: COLONOSCOPY;  Surgeon: Rogene Houston, MD; 2 polyps resected and retrieved, 1 small polyp in the distal sigmoid biopsy, external hemorrhoids.  Pathology with 1 tubular adenoma and 1 hyperplastic polyp.   . COLPOSCOPY    . ESOPHAGEAL DILATION N/A 06/15/2020   Procedure: ESOPHAGEAL DILATION;  Surgeon: Rogene Houston, MD;  Location: [facility];  Service: Endoscopy;  Laterality: N/A;  . ESOPHAGOGASTRODUODENOSCOPY  08/2017   Dr. Fuller Plan; LA grade B esophagitis without bleeding s/p  biopsied, diffuse moderate inflammation and granularity in the entire examined stomach s/p biopsied, normal examined duodenum s/p biopsy. Gastric biopsy with chronic inactive gastritis without H. pylori, esophageal biopsy with mildly inflamed squamous mucosa, duodenal biopsy benign.  . ESOPHAGOGASTRODUODENOSCOPY N/A 06/15/2020   Procedure: ESOPHAGOGASTRODUODENOSCOPY (EGD);  Surgeon: Rogene Houston, MD;  Proximal esophageal web, focal esophagitis at proximal esophagus s/p biopsy, benign-appearing esophageal stenosis s/p dilated, portal hypertensive gastropathy.  Esophageal biopsy with moderate squamous dysplasia.    Marland Kitchen FINGER SURGERY    . HEMORRHOID SURGERY    . left collar bone surgery     had fx, pin placed and then complitcations caused them to remove most of the bone  . left forearm surgery     from left elbow down    Current Outpatient Medications  Medication Sig Dispense Refill  . allopurinol (ZYLOPRIM) 300 MG tablet Take 1 tablet (300 mg total) by mouth daily. 90 tablet 1  . buPROPion (ZYBAN) 150 MG 12 hr tablet     . cyclobenzaprine (FLEXERIL) 10 MG tablet TAKE 1TAB  3 TIMES A DAY AS NEEDED FOR MUSCLE SPASMS. IF CAUSING DROWSINESS TAKE 1TAB AT BEDTIME ONLY (Patient taking differently: Take 10 mg by mouth 3 (three) times daily as needed (spasms.).) 30 tablet 5  . DULoxetine (CYMBALTA) 60 MG capsule Take 60 mg by mouth daily.    . metoprolol succinate (TOPROL-XL) 50 MG 24 hr tablet TAKE 1 TABLET BY MOUTH EVERY DAY WITH OR IMMEDIATELY FOLLOWING A MEAL (Patient taking differently: Take 50 mg by mouth daily after breakfast. TAKE 1 TABLET BY MOUTH EVERY DAY WITH OR IMMEDIATELY FOLLOWING A MEAL) 90 tablet 1  . QUEtiapine (SEROQUEL) 50 MG tablet Take 1 tablet (50 mg total) by mouth at bedtime. 30 tablet 1  . thiamine 100 MG tablet Take 1 tablet (100 mg total) by mouth daily. 30 tablet 1  . pantoprazole (PROTONIX) 40 MG tablet Take 40 mg by mouth daily. (Patient not taking: Reported on 07/07/2020)      No current facility-administered medications for this visit.    Allergies as of 07/07/2020  . (No Known Allergies)    Family History  Problem Relation Age of Onset  . Alcohol abuse Mother   . Liver disease Mother        alcoholic cirrhosis  . Heart disease Father   . Alcohol abuse Father   . Lung cancer Father   . Diabetes Sister   . Cirrhosis Brother        secondary to alcohol.   . Liver disease Sister        Alcoholic cirrhosis  . Diabetes Maternal Aunt   . Kidney disease Maternal Aunt   . Diabetes Cousin        mat cousin  . Breast cancer Neg Hx   . Colon cancer Neg Hx   . Esophageal cancer Neg Hx   . Stomach cancer Neg Hx   . Rectal cancer Neg Hx     Social History   Socioeconomic History  . Marital status: Married    Spouse name: Not on file  . Number of children: 1  . Years of education: Not on file  . Highest education level: Not on file  Occupational History  . Occupation: retired  Tobacco Use  . Smoking status: Current Every Day Smoker    Packs/day: 1.50    Years: 40.00    Pack years: 60.00    Types: Cigarettes  . Smokeless tobacco: Never Used  Vaping Use  . Vaping Use: Never used  Substance and Sexual Activity  . Alcohol use: Yes    Alcohol/week: 24.0 standard drinks    Types: 24 Cans of beer per week    Comment: 2-3 12 oz beer daily; previously 24, 12 ounce beer per week  . Drug use: No    Comment: Tried cocaine and marijuana in the 60s.   . Sexual activity: Not Currently    Birth control/protection: None  Other Topics Concern  . Not on file  Social History Narrative  . Not on file   Social Determinants of Health   Financial Resource Strain: Not on file  Food Insecurity: Not on file  Transportation Needs: Not on file  Physical Activity: Not on file  Stress: Not on file  Social Connections: Not on file    Review of Systems: Gen: Denies fever, chills, cold or flulike symptoms, presyncope, syncope. CV: Denies chest pain.   Occasional palpitations. Resp: Denies dyspnea or cough. GI: See HPI Heme: See HPI  Physical Exam: BP 134/73   Pulse 71  Temp (!) 96.9 F (36.1 C) (Temporal)   Ht 5' (1.524 m)   Wt 159 lb 3.2 oz (72.2 kg)   BMI 31.09 kg/m  General:   Alert and oriented. No distress noted. Pleasant and cooperative.  Head:  Normocephalic and atraumatic. Eyes:  Conjuctiva clear without scleral icterus. Heart:  S1, S2 present without murmurs appreciated. Lungs:  Clear to auscultation bilaterally. No wheezes, rales, or rhonchi. No distress.  Abdomen:  +BS, soft, non-tender and non-distended. No tense ascites. No rebound or guarding. Liver border is firm. No masses noted. Msk:  Symmetrical without gross deformities. Normal posture. Extremities:  Without edema. Neurologic:  Alert and  oriented x4 Psych:  Normal mood and affect.

## 2020-07-07 ENCOUNTER — Other Ambulatory Visit: Payer: Self-pay

## 2020-07-07 ENCOUNTER — Encounter: Payer: Self-pay | Admitting: Gastroenterology

## 2020-07-07 ENCOUNTER — Ambulatory Visit: Payer: Medicare HMO | Admitting: Gastroenterology

## 2020-07-07 VITALS — BP 134/73 | HR 71 | Temp 96.9°F | Ht 60.0 in | Wt 159.2 lb

## 2020-07-07 DIAGNOSIS — R197 Diarrhea, unspecified: Secondary | ICD-10-CM | POA: Insufficient documentation

## 2020-07-07 DIAGNOSIS — R7989 Other specified abnormal findings of blood chemistry: Secondary | ICD-10-CM | POA: Diagnosis not present

## 2020-07-07 DIAGNOSIS — K625 Hemorrhage of anus and rectum: Secondary | ICD-10-CM | POA: Diagnosis not present

## 2020-07-07 DIAGNOSIS — F411 Generalized anxiety disorder: Secondary | ICD-10-CM

## 2020-07-07 DIAGNOSIS — K7030 Alcoholic cirrhosis of liver without ascites: Secondary | ICD-10-CM

## 2020-07-07 DIAGNOSIS — K219 Gastro-esophageal reflux disease without esophagitis: Secondary | ICD-10-CM

## 2020-07-07 DIAGNOSIS — K209 Esophagitis, unspecified without bleeding: Secondary | ICD-10-CM | POA: Diagnosis not present

## 2020-07-07 DIAGNOSIS — K7460 Unspecified cirrhosis of liver: Secondary | ICD-10-CM | POA: Insufficient documentation

## 2020-07-07 DIAGNOSIS — R1310 Dysphagia, unspecified: Secondary | ICD-10-CM | POA: Diagnosis not present

## 2020-07-07 DIAGNOSIS — F32A Depression, unspecified: Secondary | ICD-10-CM

## 2020-07-07 NOTE — Assessment & Plan Note (Signed)
EGD 06/15/2020 with focal esophagitis at proximal esophagus.  Biopsies revealed moderate squamous dysplasia. Dr. Laural Golden recommended repeat EGD with re-biopsy in about 8 weeks.   Proceed with EGD with re-biopsy with propofol with Dr. Abbey Chatters in the near future. The risks, benefits, and alternatives have been discussed with the patient in detail. The patient states understanding and desires to proceed.  ASA III Follow-up after EGD.

## 2020-07-07 NOTE — Patient Instructions (Signed)
PA for EGD/-/+DIL submitted via [employer] website. [employer]# [id number], valid 08/30/20-09/29/20.

## 2020-07-07 NOTE — Assessment & Plan Note (Addendum)
History of elevated LFTs and recently diagnosis of cirrhosis in October 2021 via ultrasound.  Extensive serologic work-up with no significant findings.  Suspect cirrhosis and LFT elevation are secondary to alcohol.  She went to inpatient rehab for 1 week and was able to discontinue all alcohol for short course.  Unfortunately, she has resumed drinking 2-3 beer nightly due to depression and inability to sleep. This is still an improvement compared prior alcohol consumption. Most recent LFTs in November improved with alk phos 124, ALT 23, AST 52. She is asking for referral to behavioral health today, and we will make this referral for her.  She has no signs or symptoms of decompensated liver disease.  Based on labs in November 2021, MELD 8. Recent EGD in November 2021 with no esophageal varices but with portal hypertensive gastropathy.  Notably, she was briefly hospitalized prior to EGD due to hyponatremia with sodium as low as 123 likely secondary to alcohol abuse and cirrhosis.  Plan:  1.  Counseled on the importance of alcohol cessation.  We are placing referral to behavioral health.  She is hopeful that addressing her depression and insomnia will help her stop drinking alcohol. 2.  Update BMP. 3.  Counseled on 2 g sodium diet. 4.  No more than 2000 mg Tylenol daily. 5.  Advised to monitor for swelling in the abdomen, lower extremities, confusion, yellowing of the eyes or skin, bright red blood per rectum, or melena and let us know if this occurs. 6.  We are repeating EGD due to moderate squamous dysplasia in the esophagus and plan to follow-up after EGD.

## 2020-07-07 NOTE — Patient Instructions (Addendum)
Please have labs and stool studies completed at [facility]  We will arrange for you to have an upper endoscopy with possible dilation of your esophagus in the near future with Dr. Abbey Chatters.  Please resume Protonix 40 mg daily 30 minutes before breakfast.  For trouble swallowing:  Take small bites, chew thoroughly, drink plenty of liquids throughout meals.  All meats should be chopped finely. Avoid tough textures. When taking medications, drink 8 ounces of water after pills.  For diarrhea: We are checking stool studies to evaluate for infectious diarrhea. Follow a bland diet. Avoid fried, fatty, greasy, spicy foods. Avoid dairy products for now. Drink enough water to keep urine pale yellow to clear.   For cirrhosis: It is very important that you stop drinking alcohol completely. Monitor sodium intake.  No more than 2000 mg/day. No more than 2000 mg of Tylenol daily. Monitor for swelling in your lower extremities, abdomen, confusion, yellowing of your eyes or skin, bright red blood per rectum, or black stools and let us know if this occurs.  We are aso placing a referral to Behavioral Health to help with managemetn of anxiety/depression.   We will plan to follow-up with you after your upper endoscopy.  Aliene Altes, PA-C [facility]

## 2020-07-07 NOTE — Assessment & Plan Note (Signed)
Addressed under cirrhosis.

## 2020-07-07 NOTE — Assessment & Plan Note (Addendum)
New onset of postprandial watery diarrhea x 1 week. Symptoms began following an episode of constipation for which she took 2 dulcolax. Currently with 4-5 watery BMs daily which is an improvement. Can't tell me how many BMs she was having daily initially. Mild cramping prior to a BM. Eating ice cream nightly, but this is not new. No nocturnal BM. No recent antibiotics, well water, or sick contacts. Brief hospitalization in November due to hyponatremia. Colonoscopy up to date in November 2021 with 1 hyperplastic polyp, 1 tubular adenoma, and external hemorrhoids. Gallbladder in situ.   Will need to rule out infectious diarrhea due to acute change in bowel habits with persistent diarrhea. Additional differentials include influence from chronic alcohol use, dietary intolerances, celiac disease, thyroid abnormalities, and IBS.   Plan:  C. Diff GDH and toxin A/B, GI pathogen panel TSH TTG IgA (total IgA on file) Update BMP Bland diet. Avoid fried, fatty, greasy, spicy foods. Avoid dairy products for now. Drink enough water to keep urine pale yellow to clear.  Counseled on importance of alcohol cessation.  Further recommendations to follow.

## 2020-07-07 NOTE — Assessment & Plan Note (Signed)
Resolved.  Likely secondary to hemorrhoids.  Colonoscopy 06/15/2020 with 1 tubular adenoma, 1 hyperplastic polyp, and external hemorrhoids. No constipation. Currently dealing with diarrhea as discussed below. She will monitor for return of rectal bleeding. May use preparation H BID as needed for low volume rectal bleeding.

## 2020-07-07 NOTE — Assessment & Plan Note (Addendum)
EGD 06/15/2020 with proximal esophageal web, focal esophagitis at proximal esophagus s/p biopsy, benign-appearing esophageal stenosis s/p dilation.  Esophageal biopsy with moderate squamous dysplasia with recommendations to repeat EGD in about 8 weeks for rebiopsy.  Patient had mild improvement since dilation, but continues with sensation of pills and solid foods getting hung at sternal notch.  Notably, she also discontinued Protonix after EGD due to misunderstanding and is currently having GERD symptoms almost daily, which may be contributing to her ongoing dysphagia/esophagitis.  Cannot rule out esophageal dysmotility or possible residual esophageal stenosis/web that was not completely disrupted.  Plan: Repeat EGD with possible dilation and rebiopsy with propofol with Dr. Abbey Chatters in the near future. The risks, benefits, and alternatives have been discussed with the patient in detail. The patient states understanding and desires to proceed.  ASA III Resume Protonix 40 mg daily 30 minutes before breakfast. Take small bites, chew thoroughly, drink plenty of liquids throughout meals.  All meats should be chopped finely. Avoid tough textures. When taking medications, drink 8 ounces of water after pills. Follow-up after EGD.  If she continues with dysphagia symptoms, consider BPE.

## 2020-07-07 NOTE — Assessment & Plan Note (Addendum)
Chronic.  Previously well controlled on Protonix 40 mg daily.  Unfortunately, after recent EGD November 2021, patient discontinued Protonix due to miscommunication and is currently having GERD symptoms daily.  Notably, EGD revealed proximal esophageal web and esophageal stenosis s/p dilation, focal esophagitis at proximal esophagus with biopsy revealing moderate squamous dysplasia, and portal hypertensive gastropathy.  Noted mild improvement in dysphagia following EGD, but continues to have symptoms.  Query whether uncontrolled GERD is contributing.  May also have residual esophageal stenosis or esophageal web that was not completely disrupted.  Cannot rule out esophageal dysmotility.  Plan: Resume Protonix 40 mg daily 30 minutes before breakfast. Proceed with repeat EGD for rebiopsies of esophageal abnormality with propofol with Dr. Abbey Chatters.  We will add on esophageal dilation as appropriate. The risks, benefits, and alternatives have been discussed with the patient in detail. The patient states understanding and desires to proceed.  ASA III Follow-up after procedure.

## 2020-07-11 NOTE — Progress Notes (Signed)
Cc'ed to pcp °

## 2020-07-17 ENCOUNTER — Other Ambulatory Visit: Payer: Self-pay | Admitting: Family Medicine

## 2020-07-17 DIAGNOSIS — F418 Other specified anxiety disorders: Secondary | ICD-10-CM

## 2020-08-24 NOTE — Patient Instructions (Addendum)
Regina Richardson  08/24/2020     @PREFPERIOPPHARMACY @   Your procedure is scheduled on  08/30/2020.   Report to [facility] at  [phone]  A.M.   Call this number if you have problems the morning of surgery:  [phone]   Remember:  Follow the diet instructions given to you by the office.                     Take these medicines the morning of surgery with A SIP OF WATER              Allopurinol, wellbutrin, cymbalta, hydroxyzine, metoprolol, protonix.   Please brush your teeth.  Do not wear jewelry, make-up or nail polish.  Do not wear lotions, powders, or perfumes, or deodorant.  Do not shave 48 hours prior to surgery.  Men may shave face and neck.  Do not bring valuables to the hospital.  [facility] is not responsible for any belongings or valuables.  Contacts, dentures or bridgework may not be worn into surgery.  Leave your suitcase in the car.  After surgery it may be brought to your room.  For patients admitted to the hospital, discharge time will be determined by your treatment team.  Patients discharged the day of surgery will not be allowed to drive home and must have someone with them for 24 hours.   Special instructions:   DO NOT smoke tobacco or vape the morning of your procedure.   Please read over the following fact sheets that you were given. Anesthesia Post-op Instructions and Care and Recovery After Surgery       Upper Endoscopy, Adult, Care After This sheet gives you information about how to care for yourself after your procedure. Your health care provider may also give you more specific instructions. If you have problems or questions, contact your health care provider. What can I expect after the procedure? After the procedure, it is common to have:  A sore throat.  Mild stomach pain or discomfort.  Bloating.  Nausea. Follow these instructions at home:  Follow instructions from your health care provider about what to eat or drink  after your procedure.  Return to your normal activities as told by your health care provider. Ask your health care provider what activities are safe for you.  Take over-the-counter and prescription medicines only as told by your health care provider.  If you were given a sedative during the procedure, it can affect you for several hours. Do not drive or operate machinery until your health care provider says that it is safe.  Keep all follow-up visits as told by your health care provider. This is important.   Contact a health care provider if you have:  A sore throat that lasts longer than one day.  Trouble swallowing. Get help right away if:  You vomit blood or your vomit looks like coffee grounds.  You have: ? A fever. ? Bloody, black, or tarry stools. ? A severe sore throat or you cannot swallow. ? Difficulty breathing. ? Severe pain in your chest or abdomen. Summary  After the procedure, it is common to have a sore throat, mild stomach discomfort, bloating, and nausea.  If you were given a sedative during the procedure, it can affect you for several hours. Do not drive or operate machinery until your health care provider says that it is safe.  Follow instructions from your health care  provider about what to eat or drink after your procedure.  Return to your normal activities as told by your health care provider. This information is not intended to replace advice given to you by your health care provider. Make sure you discuss any questions you have with your health care provider. Document Revised: 07/07/2019 Document Reviewed: 12/09/2017 Elsevier Patient Education  2021 [employer].  https://www.asge.org/home/for-patients/patient-information/understanding-eso-dilation-updated">  Esophageal Dilatation Esophageal dilatation, also called esophageal dilation, is a procedure to widen or open a blocked or narrowed part of the esophagus. The esophagus is the part of the body that  moves food and liquid from the mouth to the stomach. You may need this procedure if:  You have a buildup of scar tissue in your esophagus that makes it difficult, painful, or impossible to swallow. This can be caused by gastroesophageal reflux disease (GERD).  You have cancer of the esophagus.  There is a problem with how food moves through your esophagus. In some cases, you may need this procedure repeated at a later time to dilate the esophagus gradually. Tell a health care provider about:  Any allergies you have.  All medicines you are taking, including vitamins, herbs, eye drops, creams, and over-the-counter medicines.  Any problems you or family members have had with anesthetic medicines.  Any blood disorders you have.  Any surgeries you have had.  Any medical conditions you have.  Any antibiotic medicines you are required to take before dental procedures.  Whether you are pregnant or may be pregnant. What are the risks? Generally, this is a safe procedure. However, problems may occur, including:  Bleeding due to a tear in the lining of the esophagus.  A hole, or perforation, in the esophagus. What happens before the procedure?  Ask your health care provider about: ? Changing or stopping your regular medicines. This is especially important if you are taking diabetes medicines or blood thinners. ? Taking medicines such as aspirin and ibuprofen. These medicines can thin your blood. Do not take these medicines unless your health care provider tells you to take them. ? Taking over-the-counter medicines, vitamins, herbs, and supplements.  Follow instructions from your health care provider about eating or drinking restrictions.  Plan to have a responsible adult take you home from the hospital or clinic.  Plan to have a responsible adult care for you for the time you are told after you leave the hospital or clinic. This is important. What happens during the procedure?  You  may be given a medicine to help you relax (sedative).  A numbing medicine may be sprayed into the back of your throat, or you may gargle the medicine.  Your health care provider may perform the dilatation using various surgical instruments, such as: ? Simple dilators. This instrument is carefully placed in the esophagus to stretch it. ? Guided wire bougies. This involves using an endoscope to insert a wire into the esophagus. A dilator is passed over this wire to enlarge the esophagus. Then the wire is removed. ? Balloon dilators. An endoscope with a small balloon is inserted into the esophagus. The balloon is inflated to stretch the esophagus and open it up. The procedure may vary among health care providers and hospitals. What can I expect after the procedure?  Your blood pressure, heart rate, breathing rate, and blood oxygen level will be monitored until you leave the hospital or clinic.  Your throat may feel slightly sore and numb. This will get better over time.  You will not  be allowed to eat or drink until your throat is no longer numb.  When you are able to drink, urinate, and sit on the edge of the bed without nausea or dizziness, you may be able to return home. Follow these instructions at home:  Take over-the-counter and prescription medicines only as told by your health care provider.  If you were given a sedative during the procedure, it can affect you for several hours. Do not drive or operate machinery until your health care provider says that it is safe.  Plan to have a responsible adult care for you for the time you are told. This is important.  Follow instructions from your health care provider about any eating or drinking restrictions.  Do not use any products that contain nicotine or tobacco, such as cigarettes, e-cigarettes, and chewing tobacco. If you need help quitting, ask your health care provider.  Keep all follow-up visits. This is important. Contact a health  care provider if:  You have a fever.  You have pain that is not relieved by medicine. Get help right away if:  You have chest pain.  You have trouble breathing.  You have trouble swallowing.  You vomit blood.  You have black, tarry, or bloody stools. These symptoms may represent a serious problem that is an emergency. Do not wait to see if the symptoms will go away. Get medical help right away. Call your local emergency services (911 in the [place] Do not drive yourself to the hospital. Summary  Esophageal dilatation, also called esophageal dilation, is a procedure to widen or open a blocked or narrowed part of the esophagus.  Plan to have a responsible adult take you home from the hospital or clinic.  For this procedure, a numbing medicine may be sprayed into the back of your throat, or you may gargle the medicine.  Do not drive or operate machinery until your health care provider says that it is safe. This information is not intended to replace advice given to you by your health care provider. Make sure you discuss any questions you have with your health care provider. Document Revised: 11/25/2019 Document Reviewed: 11/25/2019 Elsevier Patient Education  2021 [place] After This sheet gives you information about how to care for yourself after your procedure. Your health care provider may also give you more specific instructions. If you have problems or questions, contact your health care provider. What can I expect after the procedure? After the procedure, it is common to have:  Tiredness.  Forgetfulness about what happened after the procedure.  Impaired judgment for important decisions.  Nausea or vomiting.  Some difficulty with balance. Follow these instructions at home: For the time period you were told by your health care provider:  Rest as needed.  Do not participate in activities where you could fall or become  injured.  Do not drive or use machinery.  Do not drink alcohol.  Do not take sleeping pills or medicines that cause drowsiness.  Do not make important decisions or sign legal documents.  Do not take care of children on your own.      Eating and drinking  Follow the diet that is recommended by your health care provider.  Drink enough fluid to keep your urine pale yellow.  If you vomit: ? Drink water, juice, or soup when you can drink without vomiting. ? Make sure you have little or no nausea before eating solid foods. General instructions  Have  a responsible adult stay with you for the time you are told. It is important to have someone help care for you until you are awake and alert.  Take over-the-counter and prescription medicines only as told by your health care provider.  If you have sleep apnea, surgery and certain medicines can increase your risk for breathing problems. Follow instructions from your health care provider about wearing your sleep device: ? Anytime you are sleeping, including during daytime naps. ? While taking prescription pain medicines, sleeping medicines, or medicines that make you drowsy.  Avoid smoking.  Keep all follow-up visits as told by your health care provider. This is important. Contact a health care provider if:  You keep feeling nauseous or you keep vomiting.  You feel light-headed.  You are still sleepy or having trouble with balance after 24 hours.  You develop a rash.  You have a fever.  You have redness or swelling around the IV site. Get help right away if:  You have trouble breathing.  You have new-onset confusion at home. Summary  For several hours after your procedure, you may feel tired. You may also be forgetful and have poor judgment.  Have a responsible adult stay with you for the time you are told. It is important to have someone help care for you until you are awake and alert.  Rest as told. Do not drive or  operate machinery. Do not drink alcohol or take sleeping pills.  Get help right away if you have trouble breathing, or if you suddenly become confused. This information is not intended to replace advice given to you by your health care provider. Make sure you discuss any questions you have with your health care provider. Document Revised: 03/24/2020 Document Reviewed: 06/11/2019 Elsevier Patient Education  2021 [employer].

## 2020-08-25 ENCOUNTER — Telehealth: Payer: Self-pay

## 2020-08-25 ENCOUNTER — Encounter: Payer: Self-pay | Admitting: *Deleted

## 2020-08-25 NOTE — Telephone Encounter (Signed)
Noted. Agree with recommendations. This is an unfortunate situation.

## 2020-08-25 NOTE — Telephone Encounter (Signed)
Reviewed

## 2020-08-25 NOTE — Telephone Encounter (Signed)
EGD/-/+DIL w/Propofol w/Dr. Abbey Chatters ASA 3 for 08/30/20 has to be rescheduled per endo.  Called pt, procedure moved to 09/13/20 at 1:30pm. Pt upset procedure had to be rescheduled. She is having trouble swallowing/breathing at times. Advised pt, if she has trouble breathing or unable to swallow saliva she needs to go to ED. Stated she didn't want to go to ED because we had her records. Informed her GI is always on call for hospital. Stated if she went to ED she might as well take a tent.  Endo scheduler informed.  FYI to [place] and [place].

## 2020-08-26 ENCOUNTER — Encounter (HOSPITAL_COMMUNITY)
Admission: RE | Admit: 2020-08-26 | Discharge: 2020-08-26 | Disposition: A | Discharge status: Home / Self Care | Payer: Medicare HMO | Source: Ambulatory Visit | Attending: Internal Medicine | Admitting: Internal Medicine

## 2020-08-26 ENCOUNTER — Other Ambulatory Visit (HOSPITAL_COMMUNITY): Payer: Medicare HMO

## 2020-09-05 ENCOUNTER — Other Ambulatory Visit: Payer: Self-pay | Admitting: Gastroenterology

## 2020-09-05 DIAGNOSIS — F1020 Alcohol dependence, uncomplicated: Secondary | ICD-10-CM | POA: Diagnosis not present

## 2020-09-05 DIAGNOSIS — K7030 Alcoholic cirrhosis of liver without ascites: Secondary | ICD-10-CM | POA: Diagnosis not present

## 2020-09-05 DIAGNOSIS — R197 Diarrhea, unspecified: Secondary | ICD-10-CM | POA: Diagnosis not present

## 2020-09-06 NOTE — Patient Instructions (Signed)
BMET, TSH results placed on Kristen Harper's desk.

## 2020-09-07 LAB — TSH: TSH: 3.36 u[IU]/mL (ref 0.450–4.500)

## 2020-09-07 LAB — BASIC METABOLIC PANEL (7)
BUN/Creatinine Ratio: 5 — ABNORMAL LOW (ref 12–28)
BUN: 5 mg/dL — ABNORMAL LOW (ref 8–27)
CO2: 22 mmol/L (ref 20–29)
Chloride: 94 mmol/L — ABNORMAL LOW (ref 96–106)
Creatinine, Ser: 1.02 mg/dL — ABNORMAL HIGH (ref 0.57–1.00)
GFR calc Af Amer: 69 mL/min/{1.73_m2} (ref >59)
GFR calc non Af Amer: 60 mL/min/{1.73_m2} (ref >59)
Glucose: 88 mg/dL (ref 65–99)
Potassium: 4.2 mmol/L (ref 3.5–5.2)
Sodium: 130 mmol/L — ABNORMAL LOW (ref 134–144)

## 2020-09-07 LAB — TISSUE TRANSGLUTAMINASE, IGA: Transglutaminase IgA: <2 U/mL (ref 0–3)

## 2020-09-08 ENCOUNTER — Ambulatory Visit (INDEPENDENT_AMBULATORY_CARE_PROVIDER_SITE_OTHER): Payer: Medicare HMO | Admitting: Family Medicine

## 2020-09-08 DIAGNOSIS — F1010 Alcohol abuse, uncomplicated: Secondary | ICD-10-CM

## 2020-09-08 DIAGNOSIS — F5101 Primary insomnia: Secondary | ICD-10-CM

## 2020-09-08 DIAGNOSIS — F418 Other specified anxiety disorders: Secondary | ICD-10-CM

## 2020-09-08 MED ORDER — CHLORDIAZEPOXIDE HCL 25 MG PO CAPS: 25.0000 mg | ORAL_CAPSULE | Freq: Every day | ORAL | 0 refills | Status: DC | PRN

## 2020-09-08 MED ORDER — TRAZODONE HCL 50 MG PO TABS: 50.0000 mg | ORAL_TABLET | Freq: Every evening | ORAL | 2 refills | Status: DC | PRN

## 2020-09-08 NOTE — Progress Notes (Signed)
Virtual Visit via Telephone Note  I connected with Regina Richardson on 09/08/20 at 9:41 AM by telephone and verified that I am speaking with the correct person using two identifiers. Regina Richardson is currently located at home and nobody is currently with her during this visit. The provider, Resnik Brooklyn, FNP is located in their office at time of visit.  I discussed the limitations, risks, security and privacy concerns of performing an evaluation and management service by telephone and the availability of in person appointments. I also discussed with the patient that there may be a patient responsible charge related to this service. The patient expressed understanding and agreed to proceed.  Subjective: PCP: Lafond Brooklyn, FNP  Chief Complaint  Patient presents with  . Depression   Patient was previously started on Seroquel 3 months ago to help with sleep and anger.  She reports is not working.  She does not know if it is day or night.  She is waking up at 2 AM and cannot go back to sleep.  She is taking the Seroquel around 9 PM and not falling asleep for 1 to 2 hours if she falls asleep at all.  She does not feel it has made any difference in her mood.  She does do anger management classes 3 days a week.  She is still taking Wellbutrin and Cymbalta as previously prescribed.  Patient reports she is still drinking 9-10 beers per day.  She states when she tries not to drink she gets really shaky and ill.   ROS: Per HPI  Current Outpatient Medications:  .  allopurinol (ZYLOPRIM) 300 MG tablet, Take 1 tablet (300 mg total) by mouth daily., Disp: 90 tablet, Rfl: 1 .  buPROPion (WELLBUTRIN SR) 150 MG 12 hr tablet, Take 150 mg by mouth 2 (two) times daily., Disp: , Rfl:  .  DULoxetine (CYMBALTA) 60 MG capsule, Take 60 mg by mouth daily., Disp: , Rfl:  .  hydrOXYzine (ATARAX/VISTARIL) 25 MG tablet, Take 25 mg by mouth every 8 (eight) hours as needed for anxiety., Disp: , Rfl:  .  metoprolol  succinate (TOPROL-XL) 50 MG 24 hr tablet, TAKE 1 TABLET BY MOUTH EVERY DAY WITH OR IMMEDIATELY FOLLOWING A MEAL (Patient taking differently: Take 50 mg by mouth daily after breakfast.), Disp: 90 tablet, Rfl: 1 .  pantoprazole (PROTONIX) 40 MG tablet, Take 40 mg by mouth daily., Disp: , Rfl:  .  QUEtiapine (SEROQUEL) 50 MG tablet, TAKE 1 TABLET BY MOUTH EVERYDAY AT BEDTIME (Patient taking differently: Take by mouth at bedtime.), Disp: 90 tablet, Rfl: 1 .  thiamine 100 MG tablet, Take 1 tablet (100 mg total) by mouth daily. (Patient not taking: No sig reported), Disp: 30 tablet, Rfl: 1  No Known Allergies Past Medical History:  Diagnosis Date  . Alcoholism ([place])   . Anxiety   . Arrhythmia    heart  . Arthritis   . Cancer ([place])    cervical cancer in the 80s  . Cirrhosis ([place]) 16/1096   alcoholic;   . Depression   . GERD (gastroesophageal reflux disease)   . Hypertension   . Osteoporosis   . Sleep apnea    no CPAP  . Urinary incontinence     Observations/Objective: A&O  No respiratory distress or wheezing audible over the phone Mood, judgement, and thought processes all WNL  Assessment and Plan: 1. Alcohol abuse Patient really wants to quit drinking.  Rx'd Librium to help with withdrawal symptoms.  Advised to decrease from 10 down to 5 beers for the next few days, then down to 3 beers for a few days, then down to 1 beer for a few days, then stop. - chlordiazePOXIDE (LIBRIUM) 25 MG capsule; Take 1 capsule (25 mg total) by mouth daily as needed for withdrawal.  Dispense: 30 capsule; Refill: 0  2. Depression with anxiety Uncontrolled.  Seroquel D/C'd.  Rx trazodone at bedtime.   - traZODone (DESYREL) 50 MG tablet; Take 1-2 tablets (50-100 mg total) by mouth at bedtime as needed for sleep.  Dispense: 30 tablet; Refill: 2  3. Primary insomnia Uncontrolled.  Seroquel D/C'd.  Rx trazodone at bedtime.  She reports she was taking this when she was in rehab and she slept well with it. -  traZODone (DESYREL) 50 MG tablet; Take 1-2 tablets (50-100 mg total) by mouth at bedtime as needed for sleep.  Dispense: 30 tablet; Refill: 2   Follow Up Instructions: Return 1-2 weeks, for alcohol withdrawal & sleep.  I discussed the assessment and treatment plan with the patient. The patient was provided an opportunity to ask questions and all were answered. The patient agreed with the plan and demonstrated an understanding of the instructions.   The patient was advised to call back or seek an in-person evaluation if the symptoms worsen or if the condition fails to improve as anticipated.  The above assessment and management plan was discussed with the patient. The patient verbalized understanding of and has agreed to the management plan. Patient is aware to call the clinic if symptoms persist or worsen. Patient is aware when to return to the clinic for a follow-up visit. Patient educated on when it is appropriate to go to the emergency department.   Time call ended: 10:05 AM  I provided 24 minutes of non-face-to-face time during this encounter.  Hendricks Limes, MSN, APRN, FNP-C [facility] 09/08/20

## 2020-09-10 NOTE — Progress Notes (Signed)
See separate lab result note under labs for review and recommendations.

## 2020-09-12 ENCOUNTER — Other Ambulatory Visit (HOSPITAL_COMMUNITY): Payer: Medicare HMO

## 2020-09-12 ENCOUNTER — Encounter: Payer: Self-pay | Admitting: *Deleted

## 2020-09-12 ENCOUNTER — Telehealth: Payer: Self-pay | Admitting: Internal Medicine

## 2020-09-12 ENCOUNTER — Encounter (HOSPITAL_COMMUNITY)
Admission: RE | Admit: 2020-09-12 | Discharge: 2020-09-12 | Disposition: A | Discharge status: Home / Self Care | Payer: Medicare HMO | Source: Ambulatory Visit | Attending: Internal Medicine | Admitting: Internal Medicine

## 2020-09-12 NOTE — Telephone Encounter (Signed)
[phone] patient called and said that she sis not have a ride to pre op this morning so she will have to reschedule

## 2020-09-12 NOTE — Telephone Encounter (Signed)
Called pt. She has been rescheduled to 3/22 am appt. Aware will mail new prep instructions with new covid test appt. She voiced understanding

## 2020-09-16 ENCOUNTER — Other Ambulatory Visit: Payer: Self-pay | Admitting: Family Medicine

## 2020-09-16 ENCOUNTER — Other Ambulatory Visit: Payer: Self-pay | Admitting: Gastroenterology

## 2020-09-16 DIAGNOSIS — F5101 Primary insomnia: Secondary | ICD-10-CM

## 2020-09-16 DIAGNOSIS — F418 Other specified anxiety disorders: Secondary | ICD-10-CM

## 2020-09-16 DIAGNOSIS — K219 Gastro-esophageal reflux disease without esophagitis: Secondary | ICD-10-CM

## 2020-09-26 ENCOUNTER — Encounter: Payer: Self-pay | Admitting: Family Medicine

## 2020-10-03 DIAGNOSIS — F1020 Alcohol dependence, uncomplicated: Secondary | ICD-10-CM | POA: Diagnosis not present

## 2020-10-07 ENCOUNTER — Ambulatory Visit (INDEPENDENT_AMBULATORY_CARE_PROVIDER_SITE_OTHER): Payer: Medicare HMO | Admitting: Family Medicine

## 2020-10-07 ENCOUNTER — Encounter: Payer: Self-pay | Admitting: Family Medicine

## 2020-10-07 DIAGNOSIS — F1010 Alcohol abuse, uncomplicated: Secondary | ICD-10-CM

## 2020-10-07 DIAGNOSIS — F5101 Primary insomnia: Secondary | ICD-10-CM | POA: Diagnosis not present

## 2020-10-07 MED ORDER — GABAPENTIN 300 MG PO CAPS: 300.0000 mg | ORAL_CAPSULE | Freq: Every day | ORAL | 2 refills | Status: DC

## 2020-10-07 NOTE — Progress Notes (Signed)
Virtual Visit via Telephone Note  I connected with Regina Richardson on 10/07/20 at 2:12 PM by telephone and verified that I am speaking with the correct person using two identifiers. Regina Richardson is currently located at home and nobody is currently with her during this visit. The provider, Eisemann Brooklyn, FNP is located in their home at time of visit.  I discussed the limitations, risks, security and privacy concerns of performing an evaluation and management service by telephone and the availability of in person appointments. I also discussed with the patient that there may be a patient responsible charge related to this service. The patient expressed understanding and agreed to proceed.  Subjective: PCP: Wegman Brooklyn, FNP  Chief Complaint  Patient presents with  . Alcohol Problem  . Insomnia   Patient is following up on recent medication changes.  She was given a prescription for Librium to help with withdrawal side effects as she stopped drinking alcohol.  She reports she has done well and most days she does not drink anything, but she does drink on race day.  She just completed a 45-month rehabilitation course.  They are going to help her connect with other resources to continue therapy and have support for her sobriety.  She admits she has a hard time because she is stuck at home alone all day.  She lost her drivers license, but believes she will be getting them back fairly soon.  She is taking 50 mg of the trazodone and states it does make her sleepy but then when she goes to lay down she cannot fall asleep because her mind will not shut off.  She reports it is even harder now that she is not drinking before bed.   ROS: Per HPI  Current Outpatient Medications:  .  allopurinol (ZYLOPRIM) 300 MG tablet, Take 1 tablet (300 mg total) by mouth daily., Disp: 90 tablet, Rfl: 1 .  buPROPion (WELLBUTRIN SR) 150 MG 12 hr tablet, Take 150 mg by mouth 2 (two) times daily., Disp: , Rfl:  .   chlordiazePOXIDE (LIBRIUM) 25 MG capsule, Take 1 capsule (25 mg total) by mouth daily as needed for withdrawal., Disp: 30 capsule, Rfl: 0 .  DULoxetine (CYMBALTA) 60 MG capsule, Take 60 mg by mouth daily., Disp: , Rfl:  .  hydrOXYzine (ATARAX/VISTARIL) 25 MG tablet, Take 25 mg by mouth every 8 (eight) hours as needed for anxiety., Disp: , Rfl:  .  metoprolol succinate (TOPROL-XL) 50 MG 24 hr tablet, TAKE 1 TABLET BY MOUTH EVERY DAY WITH OR IMMEDIATELY FOLLOWING A MEAL (Patient taking differently: Take 50 mg by mouth daily after breakfast.), Disp: 90 tablet, Rfl: 1 .  pantoprazole (PROTONIX) 40 MG tablet, TAKE 1 TABLET BY MOUTH DAILY BEFORE BREAKFAST (Patient taking differently: Take 40 mg by mouth daily before breakfast.), Disp: 90 tablet, Rfl: 1 .  thiamine 100 MG tablet, Take 1 tablet (100 mg total) by mouth daily. (Patient not taking: No sig reported), Disp: 30 tablet, Rfl: 1 .  traZODone (DESYREL) 50 MG tablet, Take 1-2 tablets (50-100 mg total) by mouth at bedtime as needed. for sleep, Disp: 180 tablet, Rfl: 1  No Known Allergies Past Medical History:  Diagnosis Date  . Alcoholism ([place])   . Anxiety   . Arrhythmia    heart  . Arthritis   . Cancer ([place])    cervical cancer in the 80s  . Cirrhosis ([place]) 94/7654   alcoholic;   . Depression   .  GERD (gastroesophageal reflux disease)   . Hypertension   . Osteoporosis   . Sleep apnea    no CPAP  . Urinary incontinence     Observations/Objective: A&O  No respiratory distress or wheezing audible over the phone Mood, judgement, and thought processes all WNL   Assessment and Plan: 1. Alcohol abuse Patient is getting better.  She is going to continue therapy/rehabilitation.  Librium discontinued.  Starting gabapentin 300 mg at bedtime. - gabapentin (NEURONTIN) 300 MG capsule; Take 1 capsule (300 mg total) by mouth at bedtime.  Dispense: 30 capsule; Refill: 2  2. Primary insomnia Advised she may increase her trazodone to 100 mg, but  discussed she may want to wait till after she tries the gabapentin as it may very well make her sleepy.   Follow Up Instructions: Return in about 2 weeks (around 10/21/2020) for alcohol & insomnia.  I discussed the assessment and treatment plan with the patient. The patient was provided an opportunity to ask questions and all were answered. The patient agreed with the plan and demonstrated an understanding of the instructions.   The patient was advised to call back or seek an in-person evaluation if the symptoms worsen or if the condition fails to improve as anticipated.  The above assessment and management plan was discussed with the patient. The patient verbalized understanding of and has agreed to the management plan. Patient is aware to call the clinic if symptoms persist or worsen. Patient is aware when to return to the clinic for a follow-up visit. Patient educated on when it is appropriate to go to the emergency department.   Time call ended: 2:48 PM  I provided 36 minutes of non-face-to-face time during this encounter.  Hendricks Limes, MSN, APRN, FNP-C [facility] 10/07/20

## 2020-10-07 NOTE — Patient Instructions (Signed)
Regina Richardson  10/07/2020     @PREFPERIOPPHARMACY @   Your procedure is scheduled on Tuesday, 10/11/20.  Report to [facility] at [place].M.  Call this number if you have problems the morning of surgery:  [phone]   Remember:  Do not eat or drink after midnight.     Take these medicines the morning of surgery with A SIP OF WATER wellbutrin, cymbalta, hydroxyzine, metoprolol, protonix    Do not wear jewelry, make-up or nail polish.  Do not wear lotions, powders, or perfumes, or deodorant.  Do not shave 48 hours prior to surgery.  Men may shave face and neck.  Do not bring valuables to the hospital.  [facility] is not responsible for any belongings or valuables.  Contacts, dentures or bridgework may not be worn into surgery.  Leave your suitcase in the car.  After surgery it may be brought to your room.  For patients admitted to the hospital, discharge time will be determined by your treatment team.  Patients discharged the day of surgery will not be allowed to drive home.   Name and phone number of your driver:   Family  Special instructions:  Please follow the instructions given to you in Dr Reola Mosher office.  Please read over the following fact sheets that you were given. Anesthesia Post-op Instructions and Care and Recovery After Surgery      Upper Endoscopy, Adult, Care After This sheet gives you information about how to care for yourself after your procedure. Your health care provider may also give you more specific instructions. If you have problems or questions, contact your health care provider. What can I expect after the procedure? After the procedure, it is common to have:  A sore throat.  Mild stomach pain or discomfort.  Bloating.  Nausea. Follow these instructions at home:  Follow instructions from your health care provider about what to eat or drink after your procedure.  Return to your normal activities as told by your health care provider. Ask  your health care provider what activities are safe for you.  Take over-the-counter and prescription medicines only as told by your health care provider.  If you were given a sedative during the procedure, it can affect you for several hours. Do not drive or operate machinery until your health care provider says that it is safe.  Keep all follow-up visits as told by your health care provider. This is important.   Contact a health care provider if you have:  A sore throat that lasts longer than one day.  Trouble swallowing. Get help right away if:  You vomit blood or your vomit looks like coffee grounds.  You have: ? A fever. ? Bloody, black, or tarry stools. ? A severe sore throat or you cannot swallow. ? Difficulty breathing. ? Severe pain in your chest or abdomen. Summary  After the procedure, it is common to have a sore throat, mild stomach discomfort, bloating, and nausea.  If you were given a sedative during the procedure, it can affect you for several hours. Do not drive or operate machinery until your health care provider says that it is safe.  Follow instructions from your health care provider about what to eat or drink after your procedure.  Return to your normal activities as told by your health care provider. This information is not intended to replace advice given to you by your health care provider. Make sure you discuss any questions you have with your  health care provider. Document Revised: 07/07/2019 Document Reviewed: 12/09/2017 Elsevier Patient Education  2021 [place]. Monitored Anesthesia Care Anesthesia refers to techniques, procedures, and medicines that help a person stay safe and comfortable during a medical or dental procedure. Monitored anesthesia care, or sedation, is one type of anesthesia. Your anesthesia specialist may recommend sedation if you will be having a procedure that does not require you to be unconscious. You may have this procedure  for:  Cataract surgery.  A dental procedure.  A biopsy.  A colonoscopy. During the procedure, you may receive a medicine to help you relax (sedative). There are three levels of sedation:  Mild sedation. At this level, you may feel awake and relaxed. You will be able to follow directions.  Moderate sedation. At this level, you will be sleepy. You may not remember the procedure.  Deep sedation. At this level, you will be asleep. You will not remember the procedure. The more medicine you are given, the deeper your level of sedation will be. Depending on how you respond to the procedure, the anesthesia specialist may change your level of sedation or the type of anesthesia to fit your needs. An anesthesia specialist will monitor you closely during the procedure. Tell a health care provider about:  Any allergies you have.  All medicines you are taking, including vitamins, herbs, eye drops, creams, and over-the-counter medicines.  Any problems you or family members have had with anesthetic medicines.  Any blood disorders you have.  Any surgeries you have had.  Any medical conditions you have, such as sleep apnea.  Whether you are pregnant or may be pregnant.  Whether you use cigarettes, alcohol, or drugs.  Any use of steroids, whether by mouth or as a cream. What are the risks? Generally, this is a safe procedure. However, problems may occur, including:  Getting too much medicine (oversedation).  Nausea.  Allergic reaction to medicines.  Trouble breathing. If this happens, a breathing tube may be used to help with breathing. It will be removed when you are awake and breathing on your own.  Heart trouble.  Lung trouble.  Confusion that gets better with time (emergence delirium). What happens before the procedure? Staying hydrated Follow instructions from your health care provider about hydration, which may include:  Up to 2 hours before the procedure - you may continue  to drink clear liquids, such as water, clear fruit juice, black coffee, and plain tea. Eating and drinking restrictions Follow instructions from your health care provider about eating and drinking, which may include:  8 hours before the procedure - stop eating heavy meals or foods, such as meat, fried foods, or fatty foods.  6 hours before the procedure - stop eating light meals or foods, such as toast or cereal.  6 hours before the procedure - stop drinking milk or drinks that contain milk.  2 hours before the procedure - stop drinking clear liquids. Medicines Ask your health care provider about:  Changing or stopping your regular medicines. This is especially important if you are taking diabetes medicines or blood thinners.  Taking medicines such as aspirin and ibuprofen. These medicines can thin your blood. Do not take these medicines unless your health care provider tells you to take them.  Taking over-the-counter medicines, vitamins, herbs, and supplements. Tests and exams  You will have a physical exam.  You may have blood tests done to show: ? How well your kidneys and liver are working. ? How well your blood can  clot. General instructions  Plan to have a responsible adult take you home from the hospital or clinic.  If you will be going home right after the procedure, plan to have a responsible adult care for you for the time you are told. This is important. What happens during the procedure?  Your blood pressure, heart rate, breathing, level of pain, and overall condition will be monitored.  An IV will be inserted into one of your veins.  You will be given medicines as needed to keep you comfortable during the procedure. This may mean changing the level of sedation. ? Depending on your age or the procedure, the sedative may be given:  As a pill that you will swallow or as a pill that is inserted into the rectum.  As an injection into the vein or muscle.  As a spray  through the nose.  The procedure will be performed.  Your breathing, heart rate, and blood pressure will be monitored during the procedure.  When the procedure is over, the medicine will be stopped. The procedure may vary among health care providers and hospitals.   What happens after the procedure?  Your blood pressure, heart rate, breathing rate, and blood oxygen level will be monitored until you leave the hospital or clinic.  You may feel sleepy, clumsy, or nauseous.  You may feel forgetful about what happened after the procedure.  You may vomit.  You may continue to get IV fluids.  Do not drive or operate machinery until your health care provider says that it is safe. Summary  Monitored anesthesia care is used to keep a patient comfortable during short procedures.  Tell your health care provider about any allergies or health conditions you have and about all the medicines you are taking.  Before the procedure, follow instructions about when to stop eating and drinking and about changing or stopping any medicines.  Your blood pressure, heart rate, breathing rate, and blood oxygen level will be monitored until you leave the hospital or clinic.  Plan to have a responsible adult take you home from the hospital or clinic. This information is not intended to replace advice given to you by your health care provider. Make sure you discuss any questions you have with your health care provider. Document Revised: 03/24/2020 Document Reviewed: 06/11/2019 Elsevier Patient Education  2021 [employer].

## 2020-10-10 ENCOUNTER — Other Ambulatory Visit (HOSPITAL_COMMUNITY)
Admission: RE | Admit: 2020-10-10 | Discharge: 2020-10-10 | Disposition: A | Discharge status: Home / Self Care | Payer: Medicare HMO | Source: Ambulatory Visit | Attending: Internal Medicine | Admitting: Internal Medicine

## 2020-10-10 ENCOUNTER — Other Ambulatory Visit: Payer: Self-pay

## 2020-10-10 ENCOUNTER — Encounter (HOSPITAL_COMMUNITY): Payer: Self-pay

## 2020-10-10 ENCOUNTER — Encounter (HOSPITAL_COMMUNITY)
Admission: RE | Admit: 2020-10-10 | Discharge: 2020-10-10 | Disposition: A | Discharge status: Home / Self Care | Payer: Medicare HMO | Source: Ambulatory Visit | Attending: Internal Medicine | Admitting: Internal Medicine

## 2020-10-10 DIAGNOSIS — Z01812 Encounter for preprocedural laboratory examination: Secondary | ICD-10-CM | POA: Insufficient documentation

## 2020-10-10 DIAGNOSIS — Z20822 Contact with and (suspected) exposure to covid-19: Secondary | ICD-10-CM | POA: Insufficient documentation

## 2020-10-10 HISTORY — DX: Cardiac arrhythmia, unspecified: I49.9

## 2020-10-10 HISTORY — DX: Scoliosis, unspecified: M41.9

## 2020-10-10 LAB — CBC
HCT: 39.4 % (ref 36.0–46.0)
Hemoglobin: 12.9 g/dL (ref 12.0–15.0)
MCH: 32 pg (ref 26.0–34.0)
MCHC: 32.7 g/dL (ref 30.0–36.0)
MCV: 97.8 fL (ref 80.0–100.0)
Platelets: 293 10*3/uL (ref 150–400)
RBC: 4.03 MIL/uL (ref 3.87–5.11)
RDW: 13.1 % (ref 11.5–15.5)
WBC: 13.1 10*3/uL — ABNORMAL HIGH (ref 4.0–10.5)
nRBC: 0 % (ref 0.0–0.2)

## 2020-10-11 ENCOUNTER — Encounter (HOSPITAL_COMMUNITY)
Admission: RE | Disposition: A | Discharge status: Home / Self Care | Payer: Self-pay | Source: Home / Self Care | Attending: Internal Medicine

## 2020-10-11 ENCOUNTER — Ambulatory Visit (HOSPITAL_COMMUNITY): Payer: Medicare HMO | Admitting: Anesthesiology

## 2020-10-11 ENCOUNTER — Ambulatory Visit (HOSPITAL_COMMUNITY)
Admission: RE | Admit: 2020-10-11 | Discharge: 2020-10-11 | Disposition: A | Discharge status: Home / Self Care | Payer: Medicare HMO | Attending: Internal Medicine | Admitting: Internal Medicine

## 2020-10-11 ENCOUNTER — Encounter (HOSPITAL_COMMUNITY): Payer: Self-pay

## 2020-10-11 DIAGNOSIS — K3189 Other diseases of stomach and duodenum: Secondary | ICD-10-CM | POA: Diagnosis not present

## 2020-10-11 DIAGNOSIS — F418 Other specified anxiety disorders: Secondary | ICD-10-CM | POA: Diagnosis not present

## 2020-10-11 DIAGNOSIS — Z8541 Personal history of malignant neoplasm of cervix uteri: Secondary | ICD-10-CM | POA: Insufficient documentation

## 2020-10-11 DIAGNOSIS — K2289 Other specified disease of esophagus: Secondary | ICD-10-CM | POA: Diagnosis not present

## 2020-10-11 DIAGNOSIS — K2210 Ulcer of esophagus without bleeding: Secondary | ICD-10-CM | POA: Diagnosis not present

## 2020-10-11 DIAGNOSIS — R1310 Dysphagia, unspecified: Secondary | ICD-10-CM | POA: Diagnosis not present

## 2020-10-11 DIAGNOSIS — F17210 Nicotine dependence, cigarettes, uncomplicated: Secondary | ICD-10-CM | POA: Insufficient documentation

## 2020-10-11 DIAGNOSIS — Z79899 Other long term (current) drug therapy: Secondary | ICD-10-CM | POA: Insufficient documentation

## 2020-10-11 DIAGNOSIS — K766 Portal hypertension: Secondary | ICD-10-CM

## 2020-10-11 DIAGNOSIS — K219 Gastro-esophageal reflux disease without esophagitis: Secondary | ICD-10-CM

## 2020-10-11 HISTORY — PX: BIOPSY: SHX5522

## 2020-10-11 HISTORY — PX: ESOPHAGOGASTRODUODENOSCOPY (EGD) WITH PROPOFOL: SHX5813

## 2020-10-11 LAB — SARS CORONAVIRUS 2 (TAT 6-24 HRS): SARS Coronavirus 2: NEGATIVE

## 2020-10-11 SURGERY — ESOPHAGOGASTRODUODENOSCOPY (EGD) WITH PROPOFOL
Anesthesia: General

## 2020-10-11 MED ORDER — PANTOPRAZOLE SODIUM 40 MG PO TBEC: 40.0000 mg | DELAYED_RELEASE_TABLET | Freq: Every day | ORAL | 5 refills | Status: DC

## 2020-10-11 MED ORDER — LACTATED RINGERS IV SOLN: INTRAVENOUS | Status: DC | PRN

## 2020-10-11 MED ORDER — LIDOCAINE HCL (CARDIAC) PF 100 MG/5ML IV SOSY
PREFILLED_SYRINGE | INTRAVENOUS | Status: DC | PRN
  Administered 2020-10-11: 50 mg via INTRAVENOUS

## 2020-10-11 MED ORDER — PROPOFOL 10 MG/ML IV BOLUS
INTRAVENOUS | Status: DC | PRN
Start: 2020-10-11 — End: 2020-10-11
  Administered 2020-10-11: 100 mg via INTRAVENOUS

## 2020-10-11 NOTE — Op Note (Signed)
[facility] Patient Name: Regina Richardson Procedure Date: 10/11/2020 9:58 AM MRN: [id number] Date of Birth: April 13, 1961 Attending MD: Elon Alas. Abbey Chatters DO CSN: [id number] Age: 60 Admit Type: Outpatient Procedure:                Upper GI endoscopy Indications:              Dysphagia, Follow-up of precancerous lesions of the                            esophagus Providers:                Elon Alas. Abbey Chatters, DO, Crystal Page, Randa Spike, Technician Referring MD:              Medicines:                See the Anesthesia note for documentation of the                            administered medications Complications:            No immediate complications. Estimated Blood Loss:     Estimated blood loss was minimal. Procedure:                Pre-Anesthesia Assessment:                           - The anesthesia plan was to use monitored                            anesthesia care (MAC).                           After obtaining informed consent, the endoscope was                            passed under direct vision. Throughout the                            procedure, the patient's blood pressure, pulse, and                            oxygen saturations were monitored continuously. The                            [id number]) was introduced through the mouth,                            and advanced to the second part of duodenum. The                            upper GI endoscopy was accomplished without                            difficulty. The patient tolerated the procedure  well. Scope In: 10:13:31 AM Scope Out: 10:16:40 AM Total Procedure Duration: 0 hours 3 minutes 9 seconds  Findings:      Mucosal changes characterized by erosion and inflammation were found in       the upper third of the esophagus at approx 22 cm. Biopsies were taken       with a cold forceps for histology.      Moderate portal hypertensive gastropathy  was found in the entire       examined stomach.      The duodenal bulb, first portion of the duodenum and second portion of       the duodenum were normal. Impression:               - Eroded, inflamed mucosa in the esophagus.                            Biopsied.                           - Portal hypertensive gastropathy.                           - Normal duodenal bulb, first portion of the                            duodenum and second portion of the duodenum. Moderate Sedation:      Per Anesthesia Care Recommendation:           - Patient has a contact number available for                            emergencies. The signs and symptoms of potential                            delayed complications were discussed with the                            patient. Return to normal activities tomorrow.                            Written discharge instructions were provided to the                            patient.                           - Resume previous diet.                           - Continue present medications.                           - Await pathology results.                           - Use a proton pump inhibitor PO BID.                           -  Return to GI clinic in 3 months. Procedure Code(s):        --- Professional ---                           [phone], Esophagogastroduodenoscopy, flexible,                            transoral; with biopsy, single or multiple Diagnosis Code(s):        --- Professional ---                           K22.10, Ulcer of esophagus without bleeding                           K20.90, Esophagitis, unspecified without bleeding                           K76.6, Portal hypertension                           K31.89, Other diseases of stomach and duodenum                           R13.10, Dysphagia, unspecified                           K22.8, Other specified diseases of esophagus CPT copyright 2019 American Medical Association. All rights reserved. The  codes documented in this report are preliminary and upon coder review may  be revised to meet current compliance requirements. Elon Alas. Abbey Chatters, DO [place] Abbey Chatters, DO 10/11/2020 10:21:17 AM This report has been signed electronically. Number of Addenda: 0

## 2020-10-11 NOTE — Anesthesia Postprocedure Evaluation (Signed)
Anesthesia Post Note  Patient: Regina Richardson  Procedure(s) Performed: ESOPHAGOGASTRODUODENOSCOPY (EGD) WITH PROPOFOL (N/A ) BIOPSY  Patient location during evaluation: PACU Anesthesia Type: General Level of consciousness: awake Pain management: pain level controlled Vital Signs Assessment: post-procedure vital signs reviewed and stable Respiratory status: spontaneous breathing and respiratory function stable Cardiovascular status: blood pressure returned to baseline and stable Postop Assessment: no headache and no apparent nausea or vomiting Anesthetic complications: no   No complications documented.   Last Vitals:  Vitals:   10/11/20 0931 10/11/20 1023  BP: 105/72 (!) 125/57  Pulse:  72  Resp:  18  Temp:  36.6 C  SpO2:  99%    Last Pain:  Vitals:   10/11/20 1023  TempSrc: Oral  PainSc: 0-No pain                 Louann Sjogren

## 2020-10-11 NOTE — Anesthesia Procedure Notes (Signed)
Date/Time: 10/11/2020 10:15 AM Performed by: Orlie Dakin, CRNA Pre-anesthesia Checklist: Patient identified, Emergency Drugs available, Suction available and Patient being monitored Patient Re-evaluated:Patient Re-evaluated prior to induction Oxygen Delivery Method: Nasal cannula Induction Type: IV induction Placement Confirmation: positive ETCO2

## 2020-10-11 NOTE — Discharge Instructions (Addendum)
Monitored Anesthesia Care, Care After This sheet gives you information about how to care for yourself after your procedure. Your health care provider may also give you more specific instructions. If you have problems or questions, contact your health care provider. What can I expect after the procedure? After the procedure, it is common to have:  Tiredness.  Forgetfulness about what happened after the procedure.  Impaired judgment for important decisions.  Nausea or vomiting.  Some difficulty with balance. Follow these instructions at home: For the time period you were told by your health care provider:  Rest as needed.  Do not participate in activities where you could fall or become injured.  Do not drive or use machinery.  Do not drink alcohol.  Do not take sleeping pills or medicines that cause drowsiness.  Do not make important decisions or sign legal documents.  Do not take care of children on your own.      Eating and drinking  Follow the diet that is recommended by your health care provider.  Drink enough fluid to keep your urine pale yellow.  If you vomit: ? Drink water, juice, or soup when you can drink without vomiting. ? Make sure you have little or no nausea before eating solid foods. General instructions  Have a responsible adult stay with you for the time you are told. It is important to have someone help care for you until you are awake and alert.  Take over-the-counter and prescription medicines only as told by your health care provider.  If you have sleep apnea, surgery and certain medicines can increase your risk for breathing problems. Follow instructions from your health care provider about wearing your sleep device: ? Anytime you are sleeping, including during daytime naps. ? While taking prescription pain medicines, sleeping medicines, or medicines that make you drowsy.  Avoid smoking.  Keep all follow-up visits as told by your health care  provider. This is important. Contact a health care provider if:  You keep feeling nauseous or you keep vomiting.  You feel light-headed.  You are still sleepy or having trouble with balance after 24 hours.  You develop a rash.  You have a fever.  You have redness or swelling around the IV site. Get help right away if:  You have trouble breathing.  You have new-onset confusion at home. Summary  For several hours after your procedure, you may feel tired. You may also be forgetful and have poor judgment.  Have a responsible adult stay with you for the time you are told. It is important to have someone help care for you until you are awake and alert.  Rest as told. Do not drive or operate machinery. Do not drink alcohol or take sleeping pills.  Get help right away if you have trouble breathing, or if you suddenly become confused. This information is not intended to replace advice given to you by your health care provider. Make sure you discuss any questions you have with your health care provider. Document Revised: 03/24/2020 Document Reviewed: 06/11/2019 Elsevier Patient Education  2021 [place]. EGD Discharge instructions Please read the instructions outlined below and refer to this sheet in the next few weeks. These discharge instructions provide you with general information on caring for yourself after you leave the hospital. Your doctor may also give you specific instructions. While your treatment has been planned according to the most current medical practices available, unavoidable complications occasionally occur. If you have any problems or questions after  discharge, please call your doctor. ACTIVITY  You may resume your regular activity but move at a slower pace for the next 24 hours.   Take frequent rest periods for the next 24 hours.   Walking will help expel (get rid of) the air and reduce the bloated feeling in your abdomen.   No driving for 24 hours (because  of the anesthesia (medicine) used during the test).   You may shower.   Do not sign any important legal documents or operate any machinery for 24 hours (because of the anesthesia used during the test).  NUTRITION  Drink plenty of fluids.   You may resume your normal diet.   Begin with a light meal and progress to your normal diet.   Avoid alcoholic beverages for 24 hours or as instructed by your caregiver.  MEDICATIONS  You may resume your normal medications unless your caregiver tells you otherwise.  WHAT YOU CAN EXPECT TODAY  You may experience abdominal discomfort such as a feeling of fullness or "gas" pains.  FOLLOW-UP  Your doctor will discuss the results of your test with you.  SEEK IMMEDIATE MEDICAL ATTENTION IF ANY OF THE FOLLOWING OCCUR:  Excessive nausea (feeling sick to your stomach) and/or vomiting.   Severe abdominal pain and distention (swelling).   Trouble swallowing.   Temperature over 101 F (37.8 C).   Rectal bleeding or vomiting of blood.    I found the previously noted erosion in your esophagus and rebiopsy this today.  Depending on pathology results will determine our next steps.  Continue on pantoprazole for now.  Further recommendations to follow.  Follow-up with GI in 2 to 3 months.  I hope you have a great rest of your week!  Elon Alas. Abbey Chatters, D.O. Gastroenterology and Hepatology [facility]

## 2020-10-11 NOTE — Anesthesia Preprocedure Evaluation (Signed)
Anesthesia Evaluation  Patient identified by MRN, date of birth, ID band Patient awake    Reviewed: Allergy & Precautions, H&P , NPO status , Patient's Chart, lab work & pertinent test results, reviewed documented beta blocker date and time   Airway Mallampati: II  TM Distance: >3 FB Neck ROM: full    Dental no notable dental hx.    Pulmonary sleep apnea , Current Smoker and Patient abstained from smoking.,    Pulmonary exam normal breath sounds clear to auscultation       Cardiovascular Exercise Tolerance: Good hypertension, + dysrhythmias Atrial Fibrillation  Rhythm:regular Rate:Normal     Neuro/Psych PSYCHIATRIC DISORDERS Anxiety Depression negative neurological ROS     GI/Hepatic Neg liver ROS, GERD  Medicated,  Endo/Other  negative endocrine ROS  Renal/GU negative Renal ROS  negative genitourinary   Musculoskeletal   Abdominal   Peds  Hematology negative hematology ROS (+)   Anesthesia Other Findings   Reproductive/Obstetrics negative OB ROS                             Anesthesia Physical Anesthesia Plan  ASA: III  Anesthesia Plan: General   Post-op Pain Management:    Induction:   PONV Risk Score and Plan: Propofol infusion  Airway Management Planned:   Additional Equipment:   Intra-op Plan:   Post-operative Plan:   Informed Consent: I have reviewed the patients History and Physical, chart, labs and discussed the procedure including the risks, benefits and alternatives for the proposed anesthesia with the patient or authorized representative who has indicated his/her understanding and acceptance.     Dental Advisory Given  Plan Discussed with: CRNA  Anesthesia Plan Comments:         Anesthesia Quick Evaluation

## 2020-10-11 NOTE — H&P (Signed)
Primary Care Physician:  Tant Brooklyn, FNP Primary Gastroenterologist:  Dr. Abbey Chatters  Pre-Procedure History & Physical: HPI:  Regina Richardson is a 60 y.o. female is herean EGD for dysphagia and history of moderate dysplasia of the mid esophagus. Last EGD 06/15/2020  Proximal esophageal web, focal esophagitis at proximal esophagus s/p biopsy, benign-appearing esophageal stenosis s/p dilated, portal hypertensive gastropathy.  Esophageal biopsy with moderate squamous dysplasia.  Dr. Laural Golden recommended repeat EGD in about 8 weeks.   Past Medical History:  Diagnosis Date  . Alcoholism ([place])   . Anxiety   . Arrhythmia    heart  . Arthritis   . Cancer ([place])    cervical cancer in the 80s  . Cirrhosis ([place]) 97/0263   alcoholic;   . Depression   . Dysrhythmia   . GERD (gastroesophageal reflux disease)   . Hypertension   . Osteoporosis   . Scoliosis   . Sleep apnea    no CPAP  . Urinary incontinence     Past Surgical History:  Procedure Laterality Date  . ABDOMINAL HYSTERECTOMY     still has ovaries  . CERVICAL BIOPSY  W/ LOOP ELECTRODE EXCISION    . CESAREAN SECTION    . COLONOSCOPY  08/2017   Dr. Fuller Plan;  prominent and moderately lipomatous ileocecal valve, 9 mm sessile polyp in hepatic flexure, 8 mm sessile polyp in the descending colon, otherwise normal exam.  Pathology with sessile serrated polyp and hyperplastic polyp.  Recommended repeat colonoscopy in 5 years.  . COLONOSCOPY N/A 06/15/2020   Procedure: COLONOSCOPY;  Surgeon: Rogene Houston, MD; 2 polyps resected and retrieved, 1 small polyp in the distal sigmoid biopsy, external hemorrhoids.  Pathology with 1 tubular adenoma and 1 hyperplastic polyp.   . COLPOSCOPY    . ESOPHAGEAL DILATION N/A 06/15/2020   Procedure: ESOPHAGEAL DILATION;  Surgeon: Rogene Houston, MD;  Location: [facility];  Service: Endoscopy;  Laterality: N/A;  . ESOPHAGOGASTRODUODENOSCOPY  08/2017   Dr. Fuller Plan; LA grade B esophagitis without bleeding  s/p biopsied, diffuse moderate inflammation and granularity in the entire examined stomach s/p biopsied, normal examined duodenum s/p biopsy. Gastric biopsy with chronic inactive gastritis without H. pylori, esophageal biopsy with mildly inflamed squamous mucosa, duodenal biopsy benign.  . ESOPHAGOGASTRODUODENOSCOPY N/A 06/15/2020   Procedure: ESOPHAGOGASTRODUODENOSCOPY (EGD);  Surgeon: Rogene Houston, MD;  Proximal esophageal web, focal esophagitis at proximal esophagus s/p biopsy, benign-appearing esophageal stenosis s/p dilated, portal hypertensive gastropathy.  Esophageal biopsy with moderate squamous dysplasia.    [facility] Kitchen FINGER SURGERY    . HEMORRHOID SURGERY    . left collar bone surgery     had fx, pin placed and then complitcations caused them to remove most of the bone  . left forearm surgery     from left elbow down    Prior to Admission medications   Medication Sig Start Date End Date Taking? Authorizing Provider  allopurinol (ZYLOPRIM) 300 MG tablet Take 1 tablet (300 mg total) by mouth daily. 03/08/20  Yes Ivy Lynn, NP  buPROPion (WELLBUTRIN SR) 150 MG 12 hr tablet Take 150 mg by mouth 2 (two) times daily.   Yes [provider]  DULoxetine (CYMBALTA) 60 MG capsule Take 60 mg by mouth daily.   Yes [provider]  gabapentin (NEURONTIN) 300 MG capsule Take 1 capsule (300 mg total) by mouth at bedtime. 10/07/20  Yes Hendricks Limes F, FNP  hydrOXYzine (ATARAX/VISTARIL) 25 MG tablet Take 25 mg by mouth every 8 (eight)  hours as needed for anxiety.   Yes [provider]  metoprolol succinate (TOPROL-XL) 50 MG 24 hr tablet TAKE 1 TABLET BY MOUTH EVERY DAY WITH OR IMMEDIATELY FOLLOWING A MEAL Patient taking differently: Take 50 mg by mouth daily after breakfast. 03/08/20  Yes Ivy Lynn, NP  pantoprazole (PROTONIX) 40 MG tablet TAKE 1 TABLET BY MOUTH DAILY BEFORE BREAKFAST Patient taking differently: Take 40 mg by mouth daily before breakfast. 09/19/20  Yes  Erenest Rasher, PA-C  traZODone (DESYREL) 50 MG tablet Take 1-2 tablets (50-100 mg total) by mouth at bedtime as needed. for sleep 09/19/20  Yes Hendricks Limes F, FNP  thiamine 100 MG tablet Take 1 tablet (100 mg total) by mouth daily. Patient not taking: No sig reported 06/16/20   Barton Dubois, MD    Allergies as of 07/07/2020  . (No Known Allergies)    Family History  Problem Relation Age of Onset  . Alcohol abuse Mother   . Liver disease Mother        alcoholic cirrhosis  . Heart disease Father   . Alcohol abuse Father   . Lung cancer Father   . Diabetes Sister   . Cirrhosis Brother        secondary to alcohol.   . Liver disease Sister        Alcoholic cirrhosis  . Diabetes Maternal Aunt   . Kidney disease Maternal Aunt   . Diabetes Cousin        mat cousin  . Breast cancer Neg Hx   . Colon cancer Neg Hx   . Esophageal cancer Neg Hx   . Stomach cancer Neg Hx   . Rectal cancer Neg Hx     Social History   Socioeconomic History  . Marital status: Married    Spouse name: Not on file  . Number of children: 1  . Years of education: Not on file  . Highest education level: Not on file  Occupational History  . Occupation: retired  Tobacco Use  . Smoking status: Current Every Day Smoker    Packs/day: 1.50    Years: 40.00    Pack years: 60.00    Types: Cigarettes  . Smokeless tobacco: Never Used  Vaping Use  . Vaping Use: Never used  Substance and Sexual Activity  . Alcohol use: Yes    Alcohol/week: 24.0 standard drinks    Types: 24 Cans of beer per week    Comment: 2-3 12 oz beer daily; previously 24, 12 ounce beer per week  . Drug use: No    Comment: Tried cocaine and marijuana in the 60s.   . Sexual activity: Not Currently    Birth control/protection: None  Other Topics Concern  . Not on file  Social History Narrative  . Not on file   Social Determinants of Health   Financial Resource Strain: Not on file  Food Insecurity: Not on file   Transportation Needs: Not on file  Physical Activity: Not on file  Stress: Not on file  Social Connections: Not on file  Intimate Partner Violence: Not on file    Review of Systems: See HPI, otherwise negative ROS  Physical Exam: Vital signs in last 24 hours: Temp:  [97.7 F (36.5 C)-98.4 F (36.9 C)] 98.4 F (36.9 C) (03/22 0929) Pulse Rate:  [77] 77 (03/22 0929) Resp:  [18] 18 (03/22 0929) BP: (105-125)/(54-72) 105/72 (03/22 0931) SpO2:  [100 %] 100 % (03/22 0929) Weight:  [68 kg] 68 kg (03/21  1118)   General:   Alert,  Well-developed, well-nourished, pleasant and cooperative in NAD Head:  Normocephalic and atraumatic. Eyes:  Sclera clear, no icterus.   Conjunctiva pink. Ears:  Normal auditory acuity. Nose:  No deformity, discharge,  or lesions. Mouth:  No deformity or lesions, dentition normal. Neck:  Supple; no masses or thyromegaly. Lungs:  Clear throughout to auscultation.   No wheezes, crackles, or rhonchi. No acute distress. Heart:  Regular rate and rhythm; no murmurs, clicks, rubs,  or gallops. Abdomen:  Soft, nontender and nondistended. No masses, hepatosplenomegaly or hernias noted. Normal bowel sounds, without guarding, and without rebound.   Msk:  Symmetrical without gross deformities. Normal posture. Extremities:  Without clubbing or edema. Neurologic:  Alert and  oriented x4;  grossly normal neurologically. Skin:  Intact without significant lesions or rashes. Cervical Nodes:  No significant cervical adenopathy. Psych:  Alert and cooperative. Normal mood and affect.  Impression/Plan: AVIYAH SWETZ is here for an EGD for dysphagia and history of moderate dysplasia of the mid esophagus.   The risks of the procedure including infection, bleed, or perforation as well as benefits, limitations, alternatives and imponderables have been reviewed with the patient. Questions have been answered. All parties agreeable.

## 2020-10-11 NOTE — Transfer of Care (Signed)
Immediate Anesthesia Transfer of Care Note  Patient: Regina Richardson  Procedure(s) Performed: ESOPHAGOGASTRODUODENOSCOPY (EGD) WITH PROPOFOL (N/A ) BIOPSY  Patient Location: Short Stay  Anesthesia Type:General  Level of Consciousness: awake, alert  and oriented  Airway & Oxygen Therapy: Patient Spontanous Breathing  Post-op Assessment: Report given to RN and Post -op Vital signs reviewed and stable  Post vital signs: Reviewed and stable  Last Vitals:  Vitals Value Taken Time  BP    Temp    Pulse    Resp    SpO2      Last Pain:  Vitals:   10/11/20 1008  TempSrc:   PainSc: 0-No pain         Complications: No complications documented.

## 2020-10-17 LAB — SURGICAL PATHOLOGY

## 2020-10-19 ENCOUNTER — Encounter (HOSPITAL_COMMUNITY): Payer: Self-pay | Admitting: Internal Medicine

## 2020-10-25 DIAGNOSIS — F331 Major depressive disorder, recurrent, moderate: Secondary | ICD-10-CM | POA: Diagnosis not present

## 2020-11-01 DIAGNOSIS — F331 Major depressive disorder, recurrent, moderate: Secondary | ICD-10-CM | POA: Diagnosis not present

## 2020-11-18 ENCOUNTER — Telehealth: Payer: Self-pay

## 2020-11-18 NOTE — Telephone Encounter (Signed)
Good Morning, Im sending this conversation to you regarding what this pt called saying that Dr. Abbey Chatters had told her after procedure on 10/11/2020. She stated to me that he was suppose to be sending her to [place] to a specialist. I cannot find it in his notes and the pt is stating that it has been over a month and she cannot sleep @ night (has to take something to be able to sleep). Can you please try and look through his notes to see what you see because all I found was for her to repeat EGD in 8 weeks (per Dr. Laural Golden) or that may have been from November 2021 when she had one done also. Pt is very worried and would like to hear from [place] today. Dr. Abbey Chatters on [facility] please help me to figure this out.

## 2020-11-19 NOTE — Telephone Encounter (Signed)
I do not see anything about referring her to [place]. This will need to be addressed by Dr. Abbey Chatters when he returns.

## 2020-11-21 ENCOUNTER — Telehealth: Payer: Self-pay

## 2020-11-21 NOTE — Telephone Encounter (Signed)
The pt has an appt on 12/01/20 at 230 pm with Dr Rush Landmark.  The patient has been notified of this information and all questions answered.

## 2020-11-21 NOTE — Telephone Encounter (Signed)
-----   Message from Eloise Harman, DO sent at 11/21/2020  4:15 PM EDT ----- Hi just to follow-up, patient called our office today wondering about a referral to [facility].  Just wanted to make sure things were in motion.  Cutler Sunday, if you have difficulty getting a hold of her, just let me know and I can try and troubleshoot.  Thanks so much!  ----- Message ----- From: Irving Copas., MD Sent: 11/15/2020   4:51 PM EDT To: Timothy Lasso, RN, Eloise Harman, DO  KC, No worries. Ashlan Dignan, please move forward with scheduling this patient for a clinic visit with me (okay to use 930/1050/230/350 slot) as necessary. Please let Dr. Eulas Post and I know when the patient is set up for clinic. Thanks. GM ----- Message ----- From: Eloise Harman, DO Sent: 11/15/2020   4:04 PM EDT To: Irving Copas., MD  GM,  This is the patient that we previously discussed that has squamous dysplasia in her esophagus.  I recommended that she go to [facility] for ESD and she states that was too far.  She was quite adamant about this.  Interestingly, she states she would be fine to go to [place].  Would you mind setting her up for an appointment to discuss potential options?   Thanks  [facility]

## 2020-11-21 NOTE — Telephone Encounter (Signed)
Noted Regina Richardson)   2. Dr. Abbey Chatters please advise regarding this pt's statement regarding you sending her to [facility] or [place].

## 2020-11-23 NOTE — Telephone Encounter (Signed)
Thanks for the update. GM

## 2020-11-24 ENCOUNTER — Encounter: Payer: Self-pay | Admitting: Internal Medicine

## 2020-12-01 ENCOUNTER — Ambulatory Visit (INDEPENDENT_AMBULATORY_CARE_PROVIDER_SITE_OTHER): Payer: Medicare HMO | Admitting: Gastroenterology

## 2020-12-01 ENCOUNTER — Other Ambulatory Visit (INDEPENDENT_AMBULATORY_CARE_PROVIDER_SITE_OTHER): Payer: Medicare HMO

## 2020-12-01 ENCOUNTER — Encounter: Payer: Self-pay | Admitting: Gastroenterology

## 2020-12-01 VITALS — BP 118/66 | HR 70 | Ht 60.0 in | Wt 166.2 lb

## 2020-12-01 DIAGNOSIS — R198 Other specified symptoms and signs involving the digestive system and abdomen: Secondary | ICD-10-CM

## 2020-12-01 DIAGNOSIS — C159 Malignant neoplasm of esophagus, unspecified: Secondary | ICD-10-CM

## 2020-12-01 DIAGNOSIS — R1310 Dysphagia, unspecified: Secondary | ICD-10-CM

## 2020-12-01 LAB — COMPREHENSIVE METABOLIC PANEL
ALT: 19 U/L (ref 0–35)
AST: 38 U/L — ABNORMAL HIGH (ref 0–37)
Albumin: 4.2 g/dL (ref 3.5–5.2)
Alkaline Phosphatase: 124 U/L — ABNORMAL HIGH (ref 39–117)
BUN: 6 mg/dL (ref 6–23)
CO2: 31 mEq/L (ref 19–32)
Calcium: 9.8 mg/dL (ref 8.4–10.5)
Chloride: 100 mEq/L (ref 96–112)
Creatinine, Ser: 0.54 mg/dL (ref 0.40–1.20)
GFR: 100.04 mL/min (ref >60.00)
Glucose, Bld: 93 mg/dL (ref 70–99)
Potassium: 4.2 mEq/L (ref 3.5–5.1)
Sodium: 137 mEq/L (ref 135–145)
Total Bilirubin: 0.8 mg/dL (ref 0.2–1.2)
Total Protein: 8.3 g/dL (ref 6.0–8.3)

## 2020-12-01 LAB — CBC
HCT: 37.5 % (ref 36.0–46.0)
Hemoglobin: 12.6 g/dL (ref 12.0–15.0)
MCHC: 33.6 g/dL (ref 30.0–36.0)
MCV: 95.4 fl (ref 78.0–100.0)
Platelets: 305 10*3/uL (ref 150.0–400.0)
RBC: 3.94 Mil/uL (ref 3.87–5.11)
RDW: 13.8 % (ref 11.5–15.5)
WBC: 10.6 10*3/uL — ABNORMAL HIGH (ref 4.0–10.5)

## 2020-12-01 LAB — PROTIME-INR
INR: 1.2 ratio — ABNORMAL HIGH (ref 0.8–1.0)
Prothrombin Time: 13.5 s — ABNORMAL HIGH (ref 9.6–13.1)

## 2020-12-01 NOTE — Patient Instructions (Addendum)
Your provider has requested that you go to the basement level for lab work before leaving today. Press "B" on the elevator. The lab is located at the first door on the left as you exit the elevator.  Due to recent changes in healthcare laws, you may see the results of your imaging and laboratory studies on MyChart before your provider has had a chance to review them.  We understand that in some cases there may be results that are confusing or concerning to you. Not all laboratory results come back in the same time frame and the provider may be waiting for multiple results in order to interpret others.  Please give [place] 48 hours in order for your provider to thoroughly review all the results before contacting the office for clarification of your results.   Call office and ask for Regina Richardson once you look at schedule at home.   Thank you for choosing me and [facility].  Dr. Rush Landmark

## 2020-12-02 ENCOUNTER — Telehealth: Payer: Self-pay | Admitting: Gastroenterology

## 2020-12-02 DIAGNOSIS — R198 Other specified symptoms and signs involving the digestive system and abdomen: Secondary | ICD-10-CM

## 2020-12-02 DIAGNOSIS — K219 Gastro-esophageal reflux disease without esophagitis: Secondary | ICD-10-CM

## 2020-12-02 DIAGNOSIS — K209 Esophagitis, unspecified without bleeding: Secondary | ICD-10-CM

## 2020-12-02 DIAGNOSIS — C159 Malignant neoplasm of esophagus, unspecified: Secondary | ICD-10-CM

## 2020-12-02 NOTE — Progress Notes (Signed)
squamous

## 2020-12-02 NOTE — Telephone Encounter (Signed)
Inbound call from patient. States she would need to reschedule her procedure 5/26 due to her having court the same day. Best contact number [phone]

## 2020-12-05 NOTE — Telephone Encounter (Signed)
Returned call to pt to change appointment. Had to leave message.

## 2020-12-05 NOTE — Telephone Encounter (Signed)
Moved pt to 12/12/20 @ [facility] 7:30am/[facility] 6:00am. No covid test. Instructions have been sent to pt by mail.

## 2020-12-07 ENCOUNTER — Other Ambulatory Visit: Payer: Self-pay

## 2020-12-07 ENCOUNTER — Encounter: Payer: Self-pay | Admitting: Gastroenterology

## 2020-12-07 DIAGNOSIS — C159 Malignant neoplasm of esophagus, unspecified: Secondary | ICD-10-CM | POA: Insufficient documentation

## 2020-12-07 DIAGNOSIS — R198 Other specified symptoms and signs involving the digestive system and abdomen: Secondary | ICD-10-CM | POA: Insufficient documentation

## 2020-12-07 NOTE — H&P (View-Only) (Signed)
[facility] VISIT   Primary Care Provider [place], [place] [address] [phone]  Referring Provider Dr. Abbey Chatters and Dr. Fuller Plan  Patient Profile: Regina Richardson is a 60 y.o. female with a pmh significant for hypertension, OSA, osteoporosis, alcohol use disorder, MDD/anxiety, cervical cancer (status post hysterectomy), GERD with esophagitis, squamous dysplasia of proximal esophagus, colon polyps (TAs).  The patient presents to the [facility] for an evaluation and management of problem(s) noted below:  Problem List 1. Squamous dysplasia of the esophagus   2. Dysphagia, unspecified type   3. Abnormal findings on esophagogastroduodenoscopy (EGD)     History of Present Illness This is the patient's first visit to the outpatient [place] clinic.  She is followed by [facility].  Patient underwent endoscopic evaluation in November for further evaluation of dysphagia and abdominal pain and abnormal CT imaging of the cecum.  Patient was found to have squamous dysplasia of the esophagus in the setting of esophagitis.  Patient subsequently followed up for repeat endoscopy in March where esophagitis was still present in the proximal/middle esophagus with repeat biopsies showing persistent squamous dysplasia.  It is for this reason that the patient is referred for consideration of endoscopic evaluation and therapy of squamous dysplasia concerning for potential development in the future to squamous cell carcinoma.  The patient is accompanied by her sister.  Patient has been significantly upset and anxious as she was under the understanding that her biopsies had shown evidence of cancer.  I had previously discussed this case with Dr. Abbey Chatters and we had suggested potential endoscopic submucosal dissection at one of the quaternary centers as the therapy of choice for squamous cell dysplasia of the proximal/middle esophagus but the  patient had deferred on evaluation and wanted to be local if possible.  Patient continues to have issues of dysphagia.  She has had abdominal discomforts that come and go.  She denies any hematemesis or coffee-ground emesis.  She feels pyrosis symptoms are controlled on PPI therapy currently.  Patient not having any changes in her bowel habits.  GI Review of Systems Positive as above including bloating Negative for odynophagia, early satiety, melena, hematochezia  Review of Systems General: Denies fevers/chills/weight loss unintentionally HEENT: Denies oral lesions Cardiovascular: Denies chest pain/palpitations Pulmonary: Denies shortness of breath/nocturnal cough Gastroenterological: See HPI Genitourinary: Denies darkened urine Hematological: Denies easy bruising/bleeding Dermatological: Denies jaundice Psychological: Mood is anxious   Medications Current Outpatient Medications  Medication Sig Dispense Refill  . allopurinol (ZYLOPRIM) 300 MG tablet Take 1 tablet (300 mg total) by mouth daily. 90 tablet 1  . buPROPion (WELLBUTRIN SR) 150 MG 12 hr tablet Take 150 mg by mouth 2 (two) times daily.    . DULoxetine (CYMBALTA) 60 MG capsule Take 60 mg by mouth daily.    Marland Kitchen gabapentin (NEURONTIN) 300 MG capsule Take 1 capsule (300 mg total) by mouth at bedtime. (Patient taking differently: Take 300 mg by mouth at bedtime as needed (stop drinking if  feel funny).) 30 capsule 2  . hydrOXYzine (ATARAX/VISTARIL) 25 MG tablet Take 25 mg by mouth every 8 (eight) hours as needed for anxiety.    . metoprolol succinate (TOPROL-XL) 50 MG 24 hr tablet TAKE 1 TABLET BY MOUTH EVERY DAY WITH OR IMMEDIATELY FOLLOWING A MEAL (Patient taking differently: Take 50 mg by mouth daily. WITH OR IMMEDIATELY FOLLOWING A MEAL) 90 tablet 1  . pantoprazole (PROTONIX) 40 MG tablet Take 1 tablet (40 mg total)  by mouth daily before breakfast. 30 tablet 5  . thiamine 100 MG tablet Take 1 tablet (100 mg total) by mouth daily.  (Patient not taking: Reported on 12/06/2020) 30 tablet 1  . traZODone (DESYREL) 50 MG tablet Take 1-2 tablets (50-100 mg total) by mouth at bedtime as needed. for sleep (Patient taking differently: Take 50-100 mg by mouth at bedtime as needed for sleep.) 180 tablet 1  . GARLIC PO Take 1 capsule by mouth daily.     No current facility-administered medications for this visit.    Allergies No Known Allergies  Histories Past Medical History:  Diagnosis Date  . Alcoholism ([place])   . Anxiety   . Arrhythmia    heart  . Arthritis   . Cancer ([place])    cervical cancer in the 80s  . Cirrhosis ([place]) 83/1517   alcoholic;   . Depression   . Dysrhythmia   . GERD (gastroesophageal reflux disease)   . Hypertension   . Osteoporosis   . Scoliosis   . Sleep apnea    no CPAP  . Urinary incontinence    Past Surgical History:  Procedure Laterality Date  . ABDOMINAL HYSTERECTOMY     still has ovaries  . BIOPSY  10/11/2020   Procedure: BIOPSY;  Surgeon: Eloise Harman, DO;  [facility];  Service: Endoscopy;;  . CERVICAL BIOPSY  W/ LOOP ELECTRODE EXCISION    . CESAREAN SECTION    . COLONOSCOPY  08/2017   Dr. Fuller Plan;  prominent and moderately lipomatous ileocecal valve, 9 mm sessile polyp in hepatic flexure, 8 mm sessile polyp in the descending colon, otherwise normal exam.  Pathology with sessile serrated polyp and hyperplastic polyp.  Recommended repeat colonoscopy in 5 years.  . COLONOSCOPY N/A 06/15/2020   Procedure: COLONOSCOPY;  Surgeon: Rogene Houston, MD; 2 polyps resected and retrieved, 1 small polyp in the distal sigmoid biopsy, external hemorrhoids.  Pathology with 1 tubular adenoma and 1 hyperplastic polyp.   . COLPOSCOPY    . ESOPHAGEAL DILATION N/A 06/15/2020   Procedure: ESOPHAGEAL DILATION;  Surgeon: Rogene Houston, MD;  Location: [facility];  Service: Endoscopy;  Laterality: N/A;  . ESOPHAGOGASTRODUODENOSCOPY  08/2017   Dr. Fuller Plan; LA grade B esophagitis without  bleeding s/p biopsied, diffuse moderate inflammation and granularity in the entire examined stomach s/p biopsied, normal examined duodenum s/p biopsy. Gastric biopsy with chronic inactive gastritis without H. pylori, esophageal biopsy with mildly inflamed squamous mucosa, duodenal biopsy benign.  . ESOPHAGOGASTRODUODENOSCOPY N/A 06/15/2020   Procedure: ESOPHAGOGASTRODUODENOSCOPY (EGD);  Surgeon: Rogene Houston, MD;  Proximal esophageal web, focal esophagitis at proximal esophagus s/p biopsy, benign-appearing esophageal stenosis s/p dilated, portal hypertensive gastropathy.  Esophageal biopsy with moderate squamous dysplasia.    Marland Kitchen ESOPHAGOGASTRODUODENOSCOPY (EGD) WITH PROPOFOL N/A 10/11/2020   Procedure: ESOPHAGOGASTRODUODENOSCOPY (EGD) WITH PROPOFOL;  Surgeon: Eloise Harman, DO;  Location[facility];  Service: Endoscopy;  Laterality: N/A;  am  . FINGER SURGERY    . HEMORRHOID SURGERY    . left collar bone surgery     had fx, pin placed and then complitcations caused them to remove most of the bone  . left forearm surgery     from left elbow down   Social History   Socioeconomic History  . Marital status: Married    Spouse name: Not on file  . Number of children: 1  . Years of education: Not on file  . Highest education level: Not on file  Occupational  History  . Occupation: retired  Tobacco Use  . Smoking status: Current Every Day Smoker    Packs/day: 1.50    Years: 40.00    Pack years: 60.00    Types: Cigarettes  . Smokeless tobacco: Never Used  Vaping Use  . Vaping Use: Never used  Substance and Sexual Activity  . Alcohol use: Yes    Alcohol/week: 12.0 standard drinks    Types: 12 Cans of beer per week    Comment: 12 cans of beer daily  . Drug use: No    Comment: Tried cocaine and marijuana in the 60s.   . Sexual activity: Not Currently    Birth control/protection: None  Other Topics Concern  . Not on file  Social History Narrative  . Not on file   Social  Determinants of Health   Financial Resource Strain: Not on file  Food Insecurity: Not on file  Transportation Needs: Not on file  Physical Activity: Not on file  Stress: Not on file  Social Connections: Not on file  Intimate Partner Violence: Not on file   Family History  Problem Relation Age of Onset  . Alcohol abuse Mother   . Liver disease Mother        alcoholic cirrhosis  . Heart disease Father   . Alcohol abuse Father   . Lung cancer Father   . Diabetes Sister   . Cirrhosis Brother        secondary to alcohol.   . Liver disease Brother   . Liver disease Sister        Alcoholic cirrhosis  . Diabetes Maternal Aunt   . Kidney disease Maternal Aunt   . Diabetes Cousin        mat cousin  . Breast cancer Neg Hx   . Colon cancer Neg Hx   . Esophageal cancer Neg Hx   . Stomach cancer Neg Hx   . Rectal cancer Neg Hx   . Pancreatic cancer Neg Hx   . Inflammatory bowel disease Neg Hx    I have reviewed her medical, social, and family history in detail and updated the electronic medical record as necessary.    PHYSICAL EXAMINATION  BP 118/66 (BP Location: Left Arm, Patient Position: Sitting, Cuff Size: Normal)   Pulse 70   Ht 5' (1.524 m)   Wt 166 lb 4 oz (75.4 kg)   BMI 32.47 kg/m  Wt Readings from Last 3 Encounters:  12/01/20 166 lb 4 oz (75.4 kg)  10/10/20 150 lb (68 kg)  07/07/20 159 lb 3.2 oz (72.2 kg)  GEN: NAD, appears stated age, doesn't appear chronically ill, accompanied by sister PSYCH: Cooperative, without pressured speech EYE: Conjunctivae pink, sclerae anicteric ENT: Masked CV: Nontachycardic RESP: No audible wheezing GI: NABS, soft, NT/ND, without rebound or guarding, no HSM appreciated MSK/EXT: No lower extremity edema SKIN: No jaundice NEURO:  Alert & Oriented x 3, no focal deficits   REVIEW OF DATA  I reviewed the following data at the time of this encounter:  GI Procedures and Studies  November 2021 EGD - Normal hypopharynx. - Web in  the proximal esophagus. Dilated - focal esophagitis at proximal esophagus;possible pill esophagitis. Biopsied - Benign-appearing esophageal stenosis. Dilated. - Portal hypertensive gastropathy. - Normal duodenal bulb and second portion of the duodenum.  Pathology FINAL MICROSCOPIC DIAGNOSIS:  A. ESOPHAGUS, BIOPSY:  - Moderate squamous dysplasia  - See comment  March 2022 EGD - Eroded, inflamed mucosa in the esophagus. Biopsied. - Portal hypertensive  gastropathy. - Normal duodenal bulb, first portion of the duodenum and second portion of the Duodenum.  Pathology FINAL MICROSCOPIC DIAGNOSIS:  A. ESOPHAGUS, PROXIMAL, BIOPSY:  - Moderate to severe squamous dysplasia, see comment   Laboratory Studies  Reviewed those in epic  Imaging Studies  October 2021 CT hematuria IMPRESSION: 1. No CT findings to explain hematuria. No evidence of urinary tract calculus, mass, or hydronephrosis. No urinary tract filling defect on delayed phase imaging. 2. There is fatty mural stratification of the cecal base and appendix as well as the sigmoid colon rectum, this constellation of findings generally suggestive of sequelae of prior. Retroperitoneal fat stranding in the right lower quadrant. Findings are suggestive of chronic sequelae of infectious or inflammatory colitis, particularly inflammatory bowel disease such as Crohn's disease given this appearance. 3. There is inflammatory perinephric as well as right lower quadrant fat stranding, of uncertain etiology or acuity but new compared to prior examination dated 07/31/2017. 4. Hepatomegaly and hepatic steatosis. 5. Aortic Atherosclerosis (ICD10-I70.0).   ASSESSMENT  Ms. Corrow is a 60 y.o. female with a pmh significant for hypertension, OSA, osteoporosis, alcohol use disorder, MDD/anxiety, cervical cancer (status post hysterectomy), GERD with esophagitis, squamous dysplasia of proximal esophagus, colon polyps (TAs).  The patient is seen  today for evaluation and management of:  1. Squamous dysplasia of the esophagus   2. Dysphagia, unspecified type   3. Abnormal findings on esophagogastroduodenoscopy (EGD)    The patient is hemodynamically stable.  Clinically, she continues to have dysphagia symptoms but is overall stable without overt red flag symptoms.  With this being said, she has had within the last 6 months to endoscopy showing evidence of squamous dysplasia in the proximal/middle esophagus.  This is most concerning for the potential to develop into squamous cell carcinoma in the future if nothing is done.  Pyrosis symptoms seem to be controlled at this time with current PPI dosing.  Its not clear that increasing her dosing will make a difference at this time to the potential of progression of her squamous dysplasia.  Based upon the description and endoscopic pictures I do feel that it is reasonable to pursue an Advanced resection attempt of the lesion.  We discussed some of the techniques of advanced polypectomy which include Endoscopic Mucosal Resection, Endorotor Morcellation, and Tissue Ablation via Fulguration.  I think endoscopic submucosal dissection with the potential of having a complete margin and en bloc resection is in this patient's best intention rather than the above techniques.  I cannot offer ESD here in [place] unfortunately.  The risks and benefits of endoscopic evaluation were discussed with the patient; these include but are not limited to the risk of perforation, infection, bleeding, missed lesions, lack of diagnosis, severe illness requiring hospitalization, as well as anesthesia and sedation related illnesses.  During attempts at advanced resection/ESD, the risks of bleeding and perforation/leak are increased as opposed to diagnostic and screening procedures, and that was discussed with the patient as well.   In addition, I explained that with the possible need for piecemeal resection, subsequent short-interval  endoscopic evaluation for follow up and potential retreatment of the lesion/area may be necessary.  I have offered the patient a referral to [facility] advanced endoscopy for attempt at endoscopic submucosal dissection, in effort of giving the patient best opportunity for resection and curative intent rather than piecemeal resection.  I do think an endoscopic ultrasound however is important to ensure that we do not see evidence of significant disease going submucosally that may  make ESD more difficult.  The risks of an EUS including intestinal perforation, bleeding, infection, aspiration, and medication effects were discussed as was the possibility it may not give a definitive diagnosis if a biopsy is performed.  We will move forward with an endoscopic ultrasound with local stain availability to evaluate the esophagus for significantly.    The risks and benefits of endoscopic evaluation were discussed with the patient; these include but are not limited to the risk of perforation, infection, bleeding, missed lesions, lack of diagnosis, severe illness requiring hospitalization, as well as anesthesia and sedation related illnesses.  The patient is agreeable to proceed.  All patient questions were answered to the best of my ability, and the patient agrees to the aforementioned plan of action with follow-up as indicated.   PLAN  Preprocedure labs to be obtained Proceed with scheduling EUS with local stain availability for further characterization of the squamous dysplasia previously noted ESD referral to [facility] advanced end[facility] (Dr. Harl Bowie and Dr. Leroy Sea) Further evaluation of dysphagia can be considered with manometry by primary GI   Orders Placed This Encounter  Procedures  . Procedural/ Surgical Case Request: UPPER ESOPHAGEAL ENDOSCOPIC ULTRASOUND (EUS)  . CBC  . INR/PT  . Comp Met (CMET)  . Ambulatory referral to Gastroenterology    New Prescriptions   No medications on file   Modified Medications    No medications on file    Planned Follow Up No follow-ups on file.   Total Time in Face-to-Face and in Coordination of Care for patient including independent/personal interpretation/review of prior testing, medical history, examination, medication adjustment, communicating results with the patient directly, and documentation with the EHR is 45 minutes.   Justice Britain, MD [facility] Office # [phone]

## 2020-12-07 NOTE — Progress Notes (Signed)
[facility] VISIT   Primary Care Provider [place], [place] [address] [phone]  Referring Provider Dr. Abbey Chatters and Dr. Fuller Plan  Patient Profile: Regina Richardson is a 60 y.o. female with a pmh significant for hypertension, OSA, osteoporosis, alcohol use disorder, MDD/anxiety, cervical cancer (status post hysterectomy), GERD with esophagitis, squamous dysplasia of proximal esophagus, colon polyps (TAs).  The patient presents to the [facility] for an evaluation and management of problem(s) noted below:  Problem List 1. Squamous dysplasia of the esophagus   2. Dysphagia, unspecified type   3. Abnormal findings on esophagogastroduodenoscopy (EGD)     History of Present Illness This is the patient's first visit to the outpatient [place] clinic.  She is followed by [facility].  Patient underwent endoscopic evaluation in November for further evaluation of dysphagia and abdominal pain and abnormal CT imaging of the cecum.  Patient was found to have squamous dysplasia of the esophagus in the setting of esophagitis.  Patient subsequently followed up for repeat endoscopy in March where esophagitis was still present in the proximal/middle esophagus with repeat biopsies showing persistent squamous dysplasia.  It is for this reason that the patient is referred for consideration of endoscopic evaluation and therapy of squamous dysplasia concerning for potential development in the future to squamous cell carcinoma.  The patient is accompanied by her sister.  Patient has been significantly upset and anxious as she was under the understanding that her biopsies had shown evidence of cancer.  I had previously discussed this case with Dr. Abbey Chatters and we had suggested potential endoscopic submucosal dissection at one of the quaternary centers as the therapy of choice for squamous cell dysplasia of the proximal/middle esophagus but the  patient had deferred on evaluation and wanted to be local if possible.  Patient continues to have issues of dysphagia.  She has had abdominal discomforts that come and go.  She denies any hematemesis or coffee-ground emesis.  She feels pyrosis symptoms are controlled on PPI therapy currently.  Patient not having any changes in her bowel habits.  GI Review of Systems Positive as above including bloating Negative for odynophagia, early satiety, melena, hematochezia  Review of Systems General: Denies fevers/chills/weight loss unintentionally HEENT: Denies oral lesions Cardiovascular: Denies chest pain/palpitations Pulmonary: Denies shortness of breath/nocturnal cough Gastroenterological: See HPI Genitourinary: Denies darkened urine Hematological: Denies easy bruising/bleeding Dermatological: Denies jaundice Psychological: Mood is anxious   Medications Current Outpatient Medications  Medication Sig Dispense Refill  . allopurinol (ZYLOPRIM) 300 MG tablet Take 1 tablet (300 mg total) by mouth daily. 90 tablet 1  . buPROPion (WELLBUTRIN SR) 150 MG 12 hr tablet Take 150 mg by mouth 2 (two) times daily.    . DULoxetine (CYMBALTA) 60 MG capsule Take 60 mg by mouth daily.    Marland Kitchen gabapentin (NEURONTIN) 300 MG capsule Take 1 capsule (300 mg total) by mouth at bedtime. (Patient taking differently: Take 300 mg by mouth at bedtime as needed (stop drinking if  feel funny).) 30 capsule 2  . hydrOXYzine (ATARAX/VISTARIL) 25 MG tablet Take 25 mg by mouth every 8 (eight) hours as needed for anxiety.    . metoprolol succinate (TOPROL-XL) 50 MG 24 hr tablet TAKE 1 TABLET BY MOUTH EVERY DAY WITH OR IMMEDIATELY FOLLOWING A MEAL (Patient taking differently: Take 50 mg by mouth daily. WITH OR IMMEDIATELY FOLLOWING A MEAL) 90 tablet 1  . pantoprazole (PROTONIX) 40 MG tablet Take 1 tablet (40 mg total)  by mouth daily before breakfast. 30 tablet 5  . thiamine 100 MG tablet Take 1 tablet (100 mg total) by mouth daily.  (Patient not taking: Reported on 12/06/2020) 30 tablet 1  . traZODone (DESYREL) 50 MG tablet Take 1-2 tablets (50-100 mg total) by mouth at bedtime as needed. for sleep (Patient taking differently: Take 50-100 mg by mouth at bedtime as needed for sleep.) 180 tablet 1  . GARLIC PO Take 1 capsule by mouth daily.     No current facility-administered medications for this visit.    Allergies No Known Allergies  Histories Past Medical History:  Diagnosis Date  . Alcoholism ([place])   . Anxiety   . Arrhythmia    heart  . Arthritis   . Cancer ([place])    cervical cancer in the 80s  . Cirrhosis ([place]) 83/1517   alcoholic;   . Depression   . Dysrhythmia   . GERD (gastroesophageal reflux disease)   . Hypertension   . Osteoporosis   . Scoliosis   . Sleep apnea    no CPAP  . Urinary incontinence    Past Surgical History:  Procedure Laterality Date  . ABDOMINAL HYSTERECTOMY     still has ovaries  . BIOPSY  10/11/2020   Procedure: BIOPSY;  Surgeon: Eloise Harman, DO;  [facility];  Service: Endoscopy;;  . CERVICAL BIOPSY  W/ LOOP ELECTRODE EXCISION    . CESAREAN SECTION    . COLONOSCOPY  08/2017   Dr. Fuller Plan;  prominent and moderately lipomatous ileocecal valve, 9 mm sessile polyp in hepatic flexure, 8 mm sessile polyp in the descending colon, otherwise normal exam.  Pathology with sessile serrated polyp and hyperplastic polyp.  Recommended repeat colonoscopy in 5 years.  . COLONOSCOPY N/A 06/15/2020   Procedure: COLONOSCOPY;  Surgeon: Rogene Houston, MD; 2 polyps resected and retrieved, 1 small polyp in the distal sigmoid biopsy, external hemorrhoids.  Pathology with 1 tubular adenoma and 1 hyperplastic polyp.   . COLPOSCOPY    . ESOPHAGEAL DILATION N/A 06/15/2020   Procedure: ESOPHAGEAL DILATION;  Surgeon: Rogene Houston, MD;  Location: [facility];  Service: Endoscopy;  Laterality: N/A;  . ESOPHAGOGASTRODUODENOSCOPY  08/2017   Dr. Fuller Plan; LA grade B esophagitis without  bleeding s/p biopsied, diffuse moderate inflammation and granularity in the entire examined stomach s/p biopsied, normal examined duodenum s/p biopsy. Gastric biopsy with chronic inactive gastritis without H. pylori, esophageal biopsy with mildly inflamed squamous mucosa, duodenal biopsy benign.  . ESOPHAGOGASTRODUODENOSCOPY N/A 06/15/2020   Procedure: ESOPHAGOGASTRODUODENOSCOPY (EGD);  Surgeon: Rogene Houston, MD;  Proximal esophageal web, focal esophagitis at proximal esophagus s/p biopsy, benign-appearing esophageal stenosis s/p dilated, portal hypertensive gastropathy.  Esophageal biopsy with moderate squamous dysplasia.    Marland Kitchen ESOPHAGOGASTRODUODENOSCOPY (EGD) WITH PROPOFOL N/A 10/11/2020   Procedure: ESOPHAGOGASTRODUODENOSCOPY (EGD) WITH PROPOFOL;  Surgeon: Eloise Harman, DO;  Location[facility];  Service: Endoscopy;  Laterality: N/A;  am  . FINGER SURGERY    . HEMORRHOID SURGERY    . left collar bone surgery     had fx, pin placed and then complitcations caused them to remove most of the bone  . left forearm surgery     from left elbow down   Social History   Socioeconomic History  . Marital status: Married    Spouse name: Not on file  . Number of children: 1  . Years of education: Not on file  . Highest education level: Not on file  Occupational  History  . Occupation: retired  Tobacco Use  . Smoking status: Current Every Day Smoker    Packs/day: 1.50    Years: 40.00    Pack years: 60.00    Types: Cigarettes  . Smokeless tobacco: Never Used  Vaping Use  . Vaping Use: Never used  Substance and Sexual Activity  . Alcohol use: Yes    Alcohol/week: 12.0 standard drinks    Types: 12 Cans of beer per week    Comment: 12 cans of beer daily  . Drug use: No    Comment: Tried cocaine and marijuana in the 60s.   . Sexual activity: Not Currently    Birth control/protection: None  Other Topics Concern  . Not on file  Social History Narrative  . Not on file   Social  Determinants of Health   Financial Resource Strain: Not on file  Food Insecurity: Not on file  Transportation Needs: Not on file  Physical Activity: Not on file  Stress: Not on file  Social Connections: Not on file  Intimate Partner Violence: Not on file   Family History  Problem Relation Age of Onset  . Alcohol abuse Mother   . Liver disease Mother        alcoholic cirrhosis  . Heart disease Father   . Alcohol abuse Father   . Lung cancer Father   . Diabetes Sister   . Cirrhosis Brother        secondary to alcohol.   . Liver disease Brother   . Liver disease Sister        Alcoholic cirrhosis  . Diabetes Maternal Aunt   . Kidney disease Maternal Aunt   . Diabetes Cousin        mat cousin  . Breast cancer Neg Hx   . Colon cancer Neg Hx   . Esophageal cancer Neg Hx   . Stomach cancer Neg Hx   . Rectal cancer Neg Hx   . Pancreatic cancer Neg Hx   . Inflammatory bowel disease Neg Hx    I have reviewed her medical, social, and family history in detail and updated the electronic medical record as necessary.    PHYSICAL EXAMINATION  BP 118/66 (BP Location: Left Arm, Patient Position: Sitting, Cuff Size: Normal)   Pulse 70   Ht 5' (1.524 m)   Wt 166 lb 4 oz (75.4 kg)   BMI 32.47 kg/m  Wt Readings from Last 3 Encounters:  12/01/20 166 lb 4 oz (75.4 kg)  10/10/20 150 lb (68 kg)  07/07/20 159 lb 3.2 oz (72.2 kg)  GEN: NAD, appears stated age, doesn't appear chronically ill, accompanied by sister PSYCH: Cooperative, without pressured speech EYE: Conjunctivae pink, sclerae anicteric ENT: Masked CV: Nontachycardic RESP: No audible wheezing GI: NABS, soft, NT/ND, without rebound or guarding, no HSM appreciated MSK/EXT: No lower extremity edema SKIN: No jaundice NEURO:  Alert & Oriented x 3, no focal deficits   REVIEW OF DATA  I reviewed the following data at the time of this encounter:  GI Procedures and Studies  November 2021 EGD - Normal hypopharynx. - Web in  the proximal esophagus. Dilated - focal esophagitis at proximal esophagus;possible pill esophagitis. Biopsied - Benign-appearing esophageal stenosis. Dilated. - Portal hypertensive gastropathy. - Normal duodenal bulb and second portion of the duodenum.  Pathology FINAL MICROSCOPIC DIAGNOSIS:  A. ESOPHAGUS, BIOPSY:  - Moderate squamous dysplasia  - See comment  March 2022 EGD - Eroded, inflamed mucosa in the esophagus. Biopsied. - Portal hypertensive  gastropathy. - Normal duodenal bulb, first portion of the duodenum and second portion of the Duodenum.  Pathology FINAL MICROSCOPIC DIAGNOSIS:  A. ESOPHAGUS, PROXIMAL, BIOPSY:  - Moderate to severe squamous dysplasia, see comment   Laboratory Studies  Reviewed those in epic  Imaging Studies  October 2021 CT hematuria IMPRESSION: 1. No CT findings to explain hematuria. No evidence of urinary tract calculus, mass, or hydronephrosis. No urinary tract filling defect on delayed phase imaging. 2. There is fatty mural stratification of the cecal base and appendix as well as the sigmoid colon rectum, this constellation of findings generally suggestive of sequelae of prior. Retroperitoneal fat stranding in the right lower quadrant. Findings are suggestive of chronic sequelae of infectious or inflammatory colitis, particularly inflammatory bowel disease such as Crohn's disease given this appearance. 3. There is inflammatory perinephric as well as right lower quadrant fat stranding, of uncertain etiology or acuity but new compared to prior examination dated 07/31/2017. 4. Hepatomegaly and hepatic steatosis. 5. Aortic Atherosclerosis (ICD10-I70.0).   ASSESSMENT  Ms. Corrow is a 60 y.o. female with a pmh significant for hypertension, OSA, osteoporosis, alcohol use disorder, MDD/anxiety, cervical cancer (status post hysterectomy), GERD with esophagitis, squamous dysplasia of proximal esophagus, colon polyps (TAs).  The patient is seen  today for evaluation and management of:  1. Squamous dysplasia of the esophagus   2. Dysphagia, unspecified type   3. Abnormal findings on esophagogastroduodenoscopy (EGD)    The patient is hemodynamically stable.  Clinically, she continues to have dysphagia symptoms but is overall stable without overt red flag symptoms.  With this being said, she has had within the last 6 months to endoscopy showing evidence of squamous dysplasia in the proximal/middle esophagus.  This is most concerning for the potential to develop into squamous cell carcinoma in the future if nothing is done.  Pyrosis symptoms seem to be controlled at this time with current PPI dosing.  Its not clear that increasing her dosing will make a difference at this time to the potential of progression of her squamous dysplasia.  Based upon the description and endoscopic pictures I do feel that it is reasonable to pursue an Advanced resection attempt of the lesion.  We discussed some of the techniques of advanced polypectomy which include Endoscopic Mucosal Resection, Endorotor Morcellation, and Tissue Ablation via Fulguration.  I think endoscopic submucosal dissection with the potential of having a complete margin and en bloc resection is in this patient's best intention rather than the above techniques.  I cannot offer ESD here in [place] unfortunately.  The risks and benefits of endoscopic evaluation were discussed with the patient; these include but are not limited to the risk of perforation, infection, bleeding, missed lesions, lack of diagnosis, severe illness requiring hospitalization, as well as anesthesia and sedation related illnesses.  During attempts at advanced resection/ESD, the risks of bleeding and perforation/leak are increased as opposed to diagnostic and screening procedures, and that was discussed with the patient as well.   In addition, I explained that with the possible need for piecemeal resection, subsequent short-interval  endoscopic evaluation for follow up and potential retreatment of the lesion/area may be necessary.  I have offered the patient a referral to [facility] for attempt at endoscopic submucosal dissection, in effort of giving the patient best opportunity for resection and curative intent rather than piecemeal resection.  I do think an endoscopic ultrasound however is important to ensure that we do not see evidence of significant disease going submucosally that may  make ESD more difficult.  The risks of an EUS including intestinal perforation, bleeding, infection, aspiration, and medication effects were discussed as was the possibility it may not give a definitive diagnosis if a biopsy is performed.  We will move forward with an endoscopic ultrasound with local stain availability to evaluate the esophagus for significantly.    The risks and benefits of endoscopic evaluation were discussed with the patient; these include but are not limited to the risk of perforation, infection, bleeding, missed lesions, lack of diagnosis, severe illness requiring hospitalization, as well as anesthesia and sedation related illnesses.  The patient is agreeable to proceed.  All patient questions were answered to the best of my ability, and the patient agrees to the aforementioned plan of action with follow-up as indicated.   PLAN  Preprocedure labs to be obtained Proceed with scheduling EUS with local stain availability for further characterization of the squamous dysplasia previously noted ESD referral to [facility] (Dr. Harl Bowie and Dr. Leroy Sea) Further evaluation of dysphagia can be considered with manometry by primary GI   Orders Placed This Encounter  Procedures  . Procedural/ Surgical Case Request: UPPER ESOPHAGEAL ENDOSCOPIC ULTRASOUND (EUS)  . CBC  . INR/PT  . Comp Met (CMET)  . Ambulatory referral to Gastroenterology    New Prescriptions   No medications on file   Modified Medications    No medications on file    Planned Follow Up No follow-ups on file.   Total Time in Face-to-Face and in Coordination of Care for patient including independent/personal interpretation/review of prior testing, medical history, examination, medication adjustment, communicating results with the patient directly, and documentation with the EHR is 45 minutes.   Justice Britain, MD [facility] Office # [phone]

## 2020-12-12 ENCOUNTER — Ambulatory Visit (HOSPITAL_COMMUNITY): Payer: Medicare HMO | Admitting: Registered Nurse

## 2020-12-12 ENCOUNTER — Other Ambulatory Visit: Payer: Self-pay

## 2020-12-12 ENCOUNTER — Ambulatory Visit (HOSPITAL_COMMUNITY)
Admission: RE | Admit: 2020-12-12 | Discharge: 2020-12-12 | Disposition: A | Discharge status: Home / Self Care | Payer: Medicare HMO | Attending: Gastroenterology | Admitting: Gastroenterology

## 2020-12-12 ENCOUNTER — Other Ambulatory Visit (HOSPITAL_COMMUNITY): Payer: Medicare HMO

## 2020-12-12 ENCOUNTER — Encounter (HOSPITAL_COMMUNITY)
Admission: RE | Disposition: A | Discharge status: Home / Self Care | Payer: Self-pay | Source: Home / Self Care | Attending: Gastroenterology

## 2020-12-12 ENCOUNTER — Encounter (HOSPITAL_COMMUNITY): Payer: Self-pay | Admitting: Gastroenterology

## 2020-12-12 ENCOUNTER — Telehealth: Payer: Self-pay

## 2020-12-12 DIAGNOSIS — K2950 Unspecified chronic gastritis without bleeding: Secondary | ICD-10-CM | POA: Insufficient documentation

## 2020-12-12 DIAGNOSIS — I10 Essential (primary) hypertension: Secondary | ICD-10-CM | POA: Insufficient documentation

## 2020-12-12 DIAGNOSIS — Z8541 Personal history of malignant neoplasm of cervix uteri: Secondary | ICD-10-CM | POA: Diagnosis not present

## 2020-12-12 DIAGNOSIS — G4733 Obstructive sleep apnea (adult) (pediatric): Secondary | ICD-10-CM | POA: Insufficient documentation

## 2020-12-12 DIAGNOSIS — K2289 Other specified disease of esophagus: Secondary | ICD-10-CM | POA: Diagnosis not present

## 2020-12-12 DIAGNOSIS — K869 Disease of pancreas, unspecified: Secondary | ICD-10-CM | POA: Insufficient documentation

## 2020-12-12 DIAGNOSIS — K209 Esophagitis, unspecified without bleeding: Secondary | ICD-10-CM

## 2020-12-12 DIAGNOSIS — Z79899 Other long term (current) drug therapy: Secondary | ICD-10-CM | POA: Insufficient documentation

## 2020-12-12 DIAGNOSIS — K3189 Other diseases of stomach and duodenum: Secondary | ICD-10-CM | POA: Diagnosis not present

## 2020-12-12 DIAGNOSIS — R933 Abnormal findings on diagnostic imaging of other parts of digestive tract: Secondary | ICD-10-CM | POA: Diagnosis not present

## 2020-12-12 DIAGNOSIS — K2281 Esophageal polyp: Secondary | ICD-10-CM | POA: Insufficient documentation

## 2020-12-12 DIAGNOSIS — F17210 Nicotine dependence, cigarettes, uncomplicated: Secondary | ICD-10-CM | POA: Diagnosis not present

## 2020-12-12 DIAGNOSIS — Z86010 Personal history of colonic polyps: Secondary | ICD-10-CM | POA: Insufficient documentation

## 2020-12-12 DIAGNOSIS — Z801 Family history of malignant neoplasm of trachea, bronchus and lung: Secondary | ICD-10-CM | POA: Insufficient documentation

## 2020-12-12 DIAGNOSIS — K2100 Gastro-esophageal reflux disease with esophagitis, without bleeding: Secondary | ICD-10-CM | POA: Diagnosis not present

## 2020-12-12 DIAGNOSIS — R1310 Dysphagia, unspecified: Secondary | ICD-10-CM | POA: Insufficient documentation

## 2020-12-12 DIAGNOSIS — F329 Major depressive disorder, single episode, unspecified: Secondary | ICD-10-CM | POA: Insufficient documentation

## 2020-12-12 DIAGNOSIS — K766 Portal hypertension: Secondary | ICD-10-CM

## 2020-12-12 DIAGNOSIS — C159 Malignant neoplasm of esophagus, unspecified: Secondary | ICD-10-CM

## 2020-12-12 DIAGNOSIS — F419 Anxiety disorder, unspecified: Secondary | ICD-10-CM | POA: Insufficient documentation

## 2020-12-12 DIAGNOSIS — I899 Noninfective disorder of lymphatic vessels and lymph nodes, unspecified: Secondary | ICD-10-CM | POA: Diagnosis not present

## 2020-12-12 DIAGNOSIS — R198 Other specified symptoms and signs involving the digestive system and abdomen: Secondary | ICD-10-CM

## 2020-12-12 HISTORY — PX: BIOPSY: SHX5522

## 2020-12-12 HISTORY — PX: UPPER ESOPHAGEAL ENDOSCOPIC ULTRASOUND (EUS): SHX6562

## 2020-12-12 HISTORY — PX: ESOPHAGOGASTRODUODENOSCOPY (EGD) WITH PROPOFOL: SHX5813

## 2020-12-12 SURGERY — UPPER ESOPHAGEAL ENDOSCOPIC ULTRASOUND (EUS)
Anesthesia: Monitor Anesthesia Care

## 2020-12-12 MED ORDER — PROPOFOL 1000 MG/100ML IV EMUL
INTRAVENOUS | Status: AC
  Filled 2020-12-12: qty 200

## 2020-12-12 MED ORDER — SUCRALFATE 1 GM/10ML PO SUSP: 1.0000 g | Freq: Two times a day (BID) | ORAL | 1 refills | Status: DC

## 2020-12-12 MED ORDER — LACTATED RINGERS IV SOLN: INTRAVENOUS | Status: DC | PRN

## 2020-12-12 MED ORDER — LIDOCAINE HCL (CARDIAC) PF 100 MG/5ML IV SOSY
PREFILLED_SYRINGE | INTRAVENOUS | Status: DC | PRN
  Administered 2020-12-12: 30 mg via INTRAVENOUS
  Administered 2020-12-12: 100 mg via INTRAVENOUS

## 2020-12-12 MED ORDER — KETAMINE HCL 10 MG/ML IJ SOLN: INTRAMUSCULAR | Status: DC | PRN

## 2020-12-12 MED ORDER — LACTATED RINGERS IV SOLN: Freq: Once | INTRAVENOUS | Status: AC

## 2020-12-12 MED ORDER — PROPOFOL 500 MG/50ML IV EMUL
INTRAVENOUS | Status: DC | PRN
  Administered 2020-12-12: 250 ug/kg/min via INTRAVENOUS

## 2020-12-12 MED ORDER — SODIUM CHLORIDE 0.9 % IV SOLN
INTRAVENOUS | Status: DC
Start: 2020-12-12 — End: 2020-12-12

## 2020-12-12 MED ORDER — GLYCOPYRROLATE 0.2 MG/ML IJ SOLN
INTRAMUSCULAR | Status: DC | PRN
  Administered 2020-12-12: .1 mg via INTRAVENOUS

## 2020-12-12 NOTE — Telephone Encounter (Signed)
Ro have you heard anything about the referral to [facility]?

## 2020-12-12 NOTE — Discharge Instructions (Signed)
YOU HAD AN ENDOSCOPIC PROCEDURE TODAY: Refer to the procedure report and other information in the discharge instructions given to you for any specific questions about what was found during the examination. If this information does not answer your questions, please call [place] office at [phone] to clarify.   YOU SHOULD EXPECT: Some feelings of bloating in the abdomen. Passage of more gas than usual. Walking can help get rid of the air that was put into your GI tract during the procedure and reduce the bloating. If you had a lower endoscopy (such as a colonoscopy or flexible sigmoidoscopy) you may notice spotting of blood in your stool or on the toilet paper. Some abdominal soreness may be present for a day or two, also.  DIET: Your first meal following the procedure should be a light meal and then it is ok to progress to your normal diet. A half-sandwich or bowl of soup is an example of a good first meal. Heavy or fried foods are harder to digest and may make you feel nauseous or bloated. Drink plenty of fluids but you should avoid alcoholic beverages for 24 hours. If you had a esophageal dilation, please see attached instructions for diet.    ACTIVITY: Your care partner should take you home directly after the procedure. You should plan to take it easy, moving slowly for the rest of the day. You can resume normal activity the day after the procedure however YOU SHOULD NOT DRIVE, use power tools, machinery or perform tasks that involve climbing or major physical exertion for 24 hours (because of the sedation medicines used during the test).   SYMPTOMS TO REPORT IMMEDIATELY: A gastroenterologist can be reached at any hour. Please call [phone]  for any of the following symptoms:   Following upper endoscopy (EGD, EUS, ERCP, esophageal dilation) Vomiting of blood or coffee ground material  New, significant abdominal pain  New, significant chest pain or pain under the shoulder blades  Painful or  persistently difficult swallowing  New shortness of breath  Black, tarry-looking or red, bloody stools  FOLLOW UP:  If any biopsies were taken you will be contacted by phone or by letter within the next 1-3 weeks. Call [phone]  if you have not heard about the biopsies in 3 weeks.  Please also call with any specific questions about appointments or follow up tests.  

## 2020-12-12 NOTE — Telephone Encounter (Signed)
Thanks for update. My EUS procedure note will be uploaded today to Epic. I would send this information to the [facility] referral line. Hopefully we will hear something by end of the week. Can we put a reminder to see where things are with patient by next Tuesday, hopefully she will have heard by then. Thanks. GM

## 2020-12-12 NOTE — Anesthesia Procedure Notes (Signed)
Procedure Name: MAC Date/Time: 12/12/2020 3:22 PM Performed by: Lissa Morales, CRNA Pre-anesthesia Checklist: Patient identified, Emergency Drugs available, Suction available, Patient being monitored and Timeout performed Patient Re-evaluated:Patient Re-evaluated prior to induction Oxygen Delivery Method: Simple face mask Placement Confirmation: positive ETCO2

## 2020-12-12 NOTE — Anesthesia Preprocedure Evaluation (Signed)
Anesthesia Evaluation  Patient identified by MRN, date of birth, ID band Patient awake    Reviewed: Allergy & Precautions, NPO status , Patient's Chart, lab work & pertinent test results  Airway Mallampati: II  TM Distance: >3 FB Neck ROM: Full    Dental  (+) Teeth Intact   Pulmonary neg pulmonary ROS, Current Smoker,    Pulmonary exam normal        Cardiovascular hypertension, Pt. on medications and Pt. on home beta blockers  Rhythm:Regular Rate:Normal     Neuro/Psych Anxiety Depression negative neurological ROS     GI/Hepatic GERD  Medicated,(+) Cirrhosis       , Squamous dysplasia    Endo/Other  negative endocrine ROS  Renal/GU negative Renal ROS  negative genitourinary   Musculoskeletal  (+) Arthritis , Osteoarthritis,    Abdominal (+)  Abdomen: soft. Bowel sounds: normal.  Peds  Hematology negative hematology ROS (+)   Anesthesia Other Findings   Reproductive/Obstetrics                            Anesthesia Physical Anesthesia Plan  ASA: II  Anesthesia Plan: MAC   Post-op Pain Management:    Induction: Intravenous  PONV Risk Score and Plan: 1 and Treatment may vary due to age or medical condition and Propofol infusion  Airway Management Planned: Simple Face Mask, Natural Airway and Nasal Cannula  Additional Equipment: None  Intra-op Plan:   Post-operative Plan:   Informed Consent: I have reviewed the patients History and Physical, chart, labs and discussed the procedure including the risks, benefits and alternatives for the proposed anesthesia with the patient or authorized representative who has indicated his/her understanding and acceptance.     Dental advisory given  Plan Discussed with: CRNA  Anesthesia Plan Comments: (Lab Results      Component                Value               Date                      WBC                      10.6 (H)             12/01/2020                HGB                      12.6                12/01/2020                HCT                      37.5                12/01/2020                MCV                      95.4                12/01/2020                PLT  305.0               12/01/2020           Lab Results      Component                Value               Date                      NA                       137                 12/01/2020                K                        4.2                 12/01/2020                CO2                      31                  12/01/2020                GLUCOSE                  93                  12/01/2020                BUN                      6                   12/01/2020                CREATININE               0.54                12/01/2020                CALCIUM                  9.8                 12/01/2020                GFRNONAA                 60                  09/05/2020                GFRAA                    69                  09/05/2020          )        Anesthesia Quick Evaluation

## 2020-12-12 NOTE — Anesthesia Postprocedure Evaluation (Signed)
Anesthesia Post Note  Patient: Regina Richardson  Procedure(s) Performed: UPPER ESOPHAGEAL ENDOSCOPIC ULTRASOUND (EUS) (N/A ) BIOPSY     Patient location during evaluation: Endoscopy Anesthesia Type: MAC Level of consciousness: awake and alert Pain management: pain level controlled Vital Signs Assessment: post-procedure vital signs reviewed and stable Respiratory status: spontaneous breathing, nonlabored ventilation, respiratory function stable and patient connected to nasal cannula oxygen Cardiovascular status: stable and blood pressure returned to baseline Postop Assessment: no apparent nausea or vomiting Anesthetic complications: no   No complications documented.  Last Vitals:  Vitals:   12/12/20 1620 12/12/20 1630  BP: (!) 123/91 (!) 142/70  Pulse: 81 79  Resp: 20 19  Temp:    SpO2: 97% 100%    Last Pain:  Vitals:   12/12/20 1630  TempSrc:   PainSc: 0-No pain                 Belenda Cruise P Marquetta Weiskopf

## 2020-12-12 NOTE — Telephone Encounter (Signed)
The referral was faxed on 12/05/20 to [facility].

## 2020-12-12 NOTE — Op Note (Addendum)
[facility] Patient Name: Regina Richardson Procedure Date: 12/12/2020 MRN: [id number] Attending MD: Justice Britain , MD Date of Birth: 1960/09/11 CSN: [id number] Age: 60 Admit Type: Outpatient Procedure:                Upper EUS Indications:              Esophageal mucosal mass/polyp found on endoscopy,                            Dysphagia, Esophagitis Providers:                Justice Britain, MD, Kary Kos RN, RN,                            Laverda Sorenson, Technician, Enrigue Catena, CRNA Referring MD:             Elon Alas. Abbey Chatters, DO, Counterman Brooklyn Medicines:                Monitored Anesthesia Care Complications:            No immediate complications. Estimated Blood Loss:     Estimated blood loss was minimal. Procedure:                Pre-Anesthesia Assessment:                           - Prior to the procedure, a History and Physical                            was performed, and patient medications and                            allergies were reviewed. The patient's tolerance of                            previous anesthesia was also reviewed. The risks                            and benefits of the procedure and the sedation                            options and risks were discussed with the patient.                            All questions were answered, and informed consent                            was obtained. Prior Anticoagulants: The patient has                            taken no previous anticoagulant or antiplatelet                            agents. ASA Grade Assessment: III - A patient with  severe systemic disease. After reviewing the risks                            and benefits, the patient was deemed in                            satisfactory condition to undergo the procedure.                           After obtaining informed consent, the endoscope was                            passed under direct vision.  Throughout the                            procedure, the patient's blood pressure, pulse, and                            oxygen saturations were monitored continuously. The                            GIF-H190 ([id number] Olympus gastroscope was                            introduced through the mouth, and advanced to the                            second part of duodenum. The was introduced through                            the mouth, and advanced to the stomach for                            ultrasound examination from the esophagus and                            stomach. The upper EUS was accomplished without                            difficulty. The patient tolerated the procedure. Scope In: Scope Out: Findings:      ENDOSCOPIC FINDING: :      Localized moderate mucosal changes characterized by friability (with       contact bleeding), granularity, smoothness and altered texture were       found in the proximal esophagus (23 cm - 26 cm). Area was successfully       sprayed with Lugol's stain (1%) for lesion assessment, and this allowed       [place] to better define the anatomy and show that this area involves 3 cm of       esophagus with nearly 30-40% of the circumference of the wall. At the       end of the EUS portion, I returned to perform biopsies of the region,       but she began to have more significant coughing during this time and so  instead of leading to deeper sedation, since there have already been 2       prior EGDs showing Squamous dysplasia, I took 5 quick biopsy bites and       removed the scope at the end.      No other gross lesions were noted in the entire esophagus.      The Z-line was irregular and was found 39 cm from the incisors.      Patchy mildly erythematous mucosa without bleeding was found in the       entire examined stomach. Biopsies were taken with a cold forceps for       histology and Helicobacter pylori testing.      Possible mild portal  hypertensive gastropathy was found in the gastric       body (though patient has a normal spleen size on her CT scan).      No gross lesions were noted in the duodenal bulb, in the first portion       of the duodenum and in the second portion of the duodenum.      ENDOSONOGRAPHIC FINDING: :      Endosonographic imaging in the esophagus showed no wall thickening       throughout.      One benign-appearing lymph node was visualized in the middle       paraesophageal mediastinum (level 52M) with the ultrasound probe located       29 cm from the incisors. It measured 2.6 mm by 2.8 mm in maximal       cross-sectional diameter. The node was round, isoechoic and had well       defined margins.      Endosonographic imaging in the visualized portion of the liver showed no       mass-lesion.      Pancreatic parenchymal abnormalities were noted in the pancreatic body.       These consisted of lobularity without honeycombing.      The celiac region was visualized. Impression:               EGD Impression:                           - Friable (with contact bleeding), granular,                            smooth, texture changed mucosa in the esophagus at                            23-26 cm was encountered. Under NBI imaging and                            then spraying with Lugol's stain I could visualize                            a lesion that was 3 cm in length and encompassed                            30-40% of the esophageal wall. Biopsied quickly but  not as in depth as I would have like due to                            increased coughing and also 2 prior EGDs with                            Squamous dysplasia already being noted.                           - No other gross lesions in esophagus.                           - Z-line irregular, 39 cm from the incisors.                           - Erythematous mucosa in the stomach. Biopsied.                           -  Possible mild portal hypertensive gastropathy in                            proximal stomach (though again, normal spleen size                            and only hepatomegaly appreciated on most recent                            CT).                           - No gross lesions in the duodenal bulb, in the                            first portion of the duodenum and in the second                            portion of the duodenum.                           EUS Impression:                           - Endosonographic imaging in the esophagus showed                            no wall thickening throughout.                           - One benign lymph node was visualized in the                            middle paraesophageal mediastinum (level 1M).                            Tissue has not  been obtained. However, the                            endosonographic appearance is suggestive of benign                            inflammatory changes.                           - Pancreatic parenchymal abnormalities consisting                            of lobularity were noted in the pancreatic body -                            the rest of the pancreas was not visualized. Query                            possibility of Chronic Pancreatitis changes (though                            full EUS not performed as this was not the                            intention of today's procedure and she has not had                            mutliple bouts of pancreatitis or chronic                            pancreatitis pain). Moderate Sedation:      Not Applicable - Patient had care per Anesthesia. Recommendation:           - The patient will be observed post-procedure,                            until all discharge criteria are met.                           - Patient has a contact number available for                            emergencies. The signs and symptoms of potential                             delayed complications were discussed with the                            patient. Return to normal activities tomorrow.                            Written discharge instructions were provided to the                            patient.                           -  Discharge patient to home.                           - Observe patient's clinical course.                           - Await path results.                           - Continue current PPI dosing at twice daily to aid                            in healing of the region.                           - Consider initiation of Carafate BID dosing to                            further aid in healing (will discuss with patient).                           - Proceed with placed referral to [facility] (Dr. Franne Forts. Leroy Sea) to consider ESD                            of this region for definitive therapy rather than                            piecemeal resection which would not be standard of                            care.                           - The findings and recommendations were discussed                            with the patient.                           - The findings and recommendations were discussed                            with the patient's family. Procedure Code(s):        --- Professional ---                           [id number], Esophagogastroduodenoscopy, flexible,                            transoral; with endoscopic ultrasound examination                            limited to the esophagus, stomach  or duodenum, and                            adjacent structures                           43239, Esophagogastroduodenoscopy, flexible,                            transoral; with biopsy, single or multiple Diagnosis Code(s):        --- Professional ---                           K22.8, Other specified diseases of esophagus                           K31.89, Other diseases of stomach and  duodenum                           K76.6, Portal hypertension                           I89.9, Noninfective disorder of lymphatic vessels                            and lymph nodes, unspecified                           K86.9, Disease of pancreas, unspecified                           R13.10, Dysphagia, unspecified                           K20.90, Esophagitis, unspecified without bleeding CPT copyright 2019 American Medical Association. All rights reserved. The codes documented in this report are preliminary and upon coder review may  be revised to meet current compliance requirements. Justice Britain, MD 12/12/2020 4:21:44 PM Number of Addenda: 0

## 2020-12-12 NOTE — Interval H&P Note (Signed)
History and Physical Interval Note:  12/12/2020 12:15 PM  Regina Richardson  has presented today for surgery, with the diagnosis of Squamous Dysplasia, Abnormal EGD.  The various methods of treatment have been discussed with the patient and family. After consideration of risks, benefits and other options for treatment, the patient has consented to  Procedure(s): UPPER ESOPHAGEAL ENDOSCOPIC ULTRASOUND (EUS) (N/A) as a surgical intervention.  The patient's history has been reviewed, patient examined, no change in status, stable for surgery.  I have reviewed the patient's chart and labs.  Questions were answered to the patient's satisfaction.     [employer]

## 2020-12-12 NOTE — Telephone Encounter (Signed)
-----   Message from Irving Copas., MD sent at 12/12/2020 12:22 PM EDT ----- Regarding: Follow-up Karma Ansley, Can you please follow-up with [facility] and see about the clinic appointment for patient with Dr. Harl Bowie or Dr. Golden Pop for ESD consideration?  I am going to do her EUS today.  Thanks. GM

## 2020-12-12 NOTE — Transfer of Care (Signed)
Immediate Anesthesia Transfer of Care Note  Patient: Regina Richardson  Procedure(s) Performed: UPPER ESOPHAGEAL ENDOSCOPIC ULTRASOUND (EUS) (N/A ) BIOPSY  Patient Location: PACU  Anesthesia Type:MAC  Level of Consciousness: sedated and patient cooperative  Airway & Oxygen Therapy: Patient Spontanous Breathing and Patient connected to face mask oxygen  Post-op Assessment: Report given to RN and Post -op Vital signs reviewed and stable  Post vital signs: stable  Last Vitals:  Vitals Value Taken Time  BP 99/63 12/12/20 1610  Temp    Pulse 83 12/12/20 1611  Resp 22 12/12/20 1611  SpO2 99 % 12/12/20 1611  Vitals shown include unvalidated device data.  Last Pain:  Vitals:   12/12/20 1319  TempSrc: Oral  PainSc: 0-No pain         Complications: No complications documented.

## 2020-12-14 ENCOUNTER — Encounter (HOSPITAL_COMMUNITY): Payer: Self-pay | Admitting: Gastroenterology

## 2020-12-14 LAB — SURGICAL PATHOLOGY

## 2020-12-15 ENCOUNTER — Encounter: Payer: Self-pay | Admitting: Gastroenterology

## 2020-12-21 ENCOUNTER — Ambulatory Visit (INDEPENDENT_AMBULATORY_CARE_PROVIDER_SITE_OTHER): Payer: Medicare HMO | Admitting: Family Medicine

## 2020-12-21 ENCOUNTER — Encounter: Payer: Self-pay | Admitting: Family Medicine

## 2020-12-21 DIAGNOSIS — F5101 Primary insomnia: Secondary | ICD-10-CM

## 2020-12-21 DIAGNOSIS — F418 Other specified anxiety disorders: Secondary | ICD-10-CM | POA: Diagnosis not present

## 2020-12-21 DIAGNOSIS — F1010 Alcohol abuse, uncomplicated: Secondary | ICD-10-CM | POA: Diagnosis not present

## 2020-12-21 MED ORDER — TRAZODONE HCL 100 MG PO TABS: 100.0000 mg | ORAL_TABLET | Freq: Every day | ORAL | 1 refills | Status: DC

## 2020-12-21 MED ORDER — CITALOPRAM HYDROBROMIDE 20 MG PO TABS: 20.0000 mg | ORAL_TABLET | Freq: Every day | ORAL | 2 refills | Status: DC

## 2020-12-21 MED ORDER — ALLOPURINOL 300 MG PO TABS: 300.0000 mg | ORAL_TABLET | Freq: Every day | ORAL | 1 refills | Status: DC

## 2020-12-21 MED ORDER — METOPROLOL SUCCINATE ER 50 MG PO TB24: 50.0000 mg | ORAL_TABLET | Freq: Every day | ORAL | 1 refills | Status: DC

## 2020-12-21 NOTE — Progress Notes (Signed)
Virtual Visit via Telephone Note  I connected with Regina Richardson on 12/21/20 at 1:12 PM by telephone and verified that I am speaking with the correct person using two identifiers. Regina Richardson is currently located at home and nobody is currently with her during this visit. The provider, Randal Brooklyn, FNP is located in their office at time of visit.  I discussed the limitations, risks, security and privacy concerns of performing an evaluation and management service by telephone and the availability of in person appointments. I also discussed with the patient that there may be a patient responsible charge related to this service. The patient expressed understanding and agreed to proceed.  Subjective: PCP: Arizmendi Brooklyn, FNP  Chief Complaint  Patient presents with   Hypertension   Alcohol abuse: patient was started on gabapentin to help with cessation of alcohol. She is not taking as prescribed and is only taking as needed when she is "feeling crazy". She continues to drink a reported 5-6 beers/day.   Insomnia: has not tried increasing Trazodone from 50 mg to 100 mg, but continues to report difficulty sleeping.   Depression: not doing well. Previously failed Prozac and clonazepam.   Depression screen [facility] 2/9 12/21/2020 03/08/2020 05/04/2019  Decreased Interest 3 3 3   Down, Depressed, Hopeless 3 3 3   PHQ - 2 Score 6 6 6   Altered sleeping 3 3 3   Tired, decreased energy 3 2 0  Change in appetite 2 0 1  Feeling bad or failure about yourself  3 2 0  Trouble concentrating 3 2 0  Moving slowly or fidgety/restless 1 1 0  Suicidal thoughts 0 0 1  PHQ-9 Score 21 16 11   Difficult doing work/chores Very difficult Somewhat difficult Somewhat difficult   GAD 7 : Generalized Anxiety Score 12/21/2020 03/08/2020  Nervous, Anxious, on Edge 3 2  Control/stop worrying 3 2  Worry too much - different things 3 2  Trouble relaxing 3 2  Restless 3 1  Easily annoyed or irritable 3 2  Afraid - awful  might happen 3 1  Total GAD 7 Score 21 12  Anxiety Difficulty Somewhat difficult -    ROS: Per HPI  Current Outpatient Medications:    allopurinol (ZYLOPRIM) 300 MG tablet, Take 1 tablet (300 mg total) by mouth daily., Disp: 90 tablet, Rfl: 1   buPROPion (WELLBUTRIN SR) 150 MG 12 hr tablet, Take 150 mg by mouth 2 (two) times daily., Disp: , Rfl:    DULoxetine (CYMBALTA) 60 MG capsule, Take 60 mg by mouth daily., Disp: , Rfl:    gabapentin (NEURONTIN) 300 MG capsule, Take 1 capsule (300 mg total) by mouth at bedtime. (Patient taking differently: Take 300 mg by mouth at bedtime as needed (stop drinking if  feel funny).), Disp: 30 capsule, Rfl: 2   GARLIC PO, Take 1 capsule by mouth daily., Disp: , Rfl:    hydrOXYzine (ATARAX/VISTARIL) 25 MG tablet, Take 25 mg by mouth every 8 (eight) hours as needed for anxiety., Disp: , Rfl:    metoprolol succinate (TOPROL-XL) 50 MG 24 hr tablet, TAKE 1 TABLET BY MOUTH EVERY DAY WITH OR IMMEDIATELY FOLLOWING A MEAL (Patient taking differently: Take 50 mg by mouth daily. WITH OR IMMEDIATELY FOLLOWING A MEAL), Disp: 90 tablet, Rfl: 1   pantoprazole (PROTONIX) 40 MG tablet, Take 1 tablet (40 mg total) by mouth daily before breakfast., Disp: 30 tablet, Rfl: 5   sucralfate (CARAFATE) 1 GM/10ML suspension, Take 10 mLs (1 g total)  by mouth 2 (two) times daily., Disp: 420 mL, Rfl: 1   traZODone (DESYREL) 50 MG tablet, Take 1-2 tablets (50-100 mg total) by mouth at bedtime as needed. for sleep (Patient taking differently: Take 50-100 mg by mouth at bedtime as needed for sleep.), Disp: 180 tablet, Rfl: 1  No Known Allergies Past Medical History:  Diagnosis Date   Alcoholism ([place])    Anxiety    Arrhythmia    heart   Arthritis    Cancer ([place])    cervical cancer in the 80s   Cirrhosis ([place]) 02/222   alcoholic;    Depression    Dysrhythmia    GERD (gastroesophageal reflux disease)    Hypertension    Osteoporosis    Scoliosis    Urinary incontinence      Observations/Objective: A&O  No respiratory distress or wheezing audible over the phone Mood, judgement, and thought processes all WNL   Assessment and Plan: 1. Alcohol abuse Patient continues to drink. Gabapentin D/C'd as she was not taking as prescribed and as continued to drink.  2. Primary insomnia Uncontrolled. Trazodone increased from 50 mg to 100 mg at bedtime. - traZODone (DESYREL) 100 MG tablet; Take 1 tablet (100 mg total) by mouth at bedtime.  Dispense: 90 tablet; Refill: 1  3. Depression with anxiety Uncontrolled. Trazodone increased from 50 mg to 100 mg. Started Celexa 20 mg once daily.  - citalopram (CELEXA) 20 MG tablet; Take 1 tablet (20 mg total) by mouth daily.  Dispense: 30 tablet; Refill: 2 - traZODone (DESYREL) 100 MG tablet; Take 1 tablet (100 mg total) by mouth at bedtime.  Dispense: 90 tablet; Refill: 1   Follow Up Instructions: Return in about 6 weeks (around 02/01/2021) for depression, anxiety (30 minute appointment).  I discussed the assessment and treatment plan with the patient. The patient was provided an opportunity to ask questions and all were answered. The patient agreed with the plan and demonstrated an understanding of the instructions.   The patient was advised to call back or seek an in-person evaluation if the symptoms worsen or if the condition fails to improve as anticipated.  The above assessment and management plan was discussed with the patient. The patient verbalized understanding of and has agreed to the management plan. Patient is aware to call the clinic if symptoms persist or worsen. Patient is aware when to return to the clinic for a follow-up visit. Patient educated on when it is appropriate to go to the emergency department.   Time call ended: 1:48 PM  I provided 36 minutes of non-face-to-face time during this encounter.  Hendricks Limes, MSN, APRN, FNP-C [facility] 12/21/20

## 2021-01-10 ENCOUNTER — Ambulatory Visit: Payer: Medicare HMO | Admitting: Family Medicine

## 2021-01-11 ENCOUNTER — Other Ambulatory Visit: Payer: Self-pay | Admitting: Family Medicine

## 2021-01-11 ENCOUNTER — Ambulatory Visit: Payer: Medicare HMO | Admitting: Gastroenterology

## 2021-01-11 DIAGNOSIS — Z1231 Encounter for screening mammogram for malignant neoplasm of breast: Secondary | ICD-10-CM

## 2021-01-13 ENCOUNTER — Other Ambulatory Visit: Payer: Self-pay | Admitting: Family Medicine

## 2021-01-13 DIAGNOSIS — F418 Other specified anxiety disorders: Secondary | ICD-10-CM

## 2021-02-01 ENCOUNTER — Other Ambulatory Visit: Payer: Self-pay | Admitting: Gastroenterology

## 2021-02-11 ENCOUNTER — Other Ambulatory Visit: Payer: Self-pay | Admitting: Family Medicine

## 2021-02-11 DIAGNOSIS — F418 Other specified anxiety disorders: Secondary | ICD-10-CM

## 2021-02-13 DIAGNOSIS — F1010 Alcohol abuse, uncomplicated: Secondary | ICD-10-CM | POA: Diagnosis not present

## 2021-02-13 DIAGNOSIS — Q399 Congenital malformation of esophagus, unspecified: Secondary | ICD-10-CM | POA: Diagnosis not present

## 2021-02-13 DIAGNOSIS — F17210 Nicotine dependence, cigarettes, uncomplicated: Secondary | ICD-10-CM | POA: Diagnosis not present

## 2021-02-13 DIAGNOSIS — K709 Alcoholic liver disease, unspecified: Secondary | ICD-10-CM | POA: Diagnosis not present

## 2021-03-09 DIAGNOSIS — C159 Malignant neoplasm of esophagus, unspecified: Secondary | ICD-10-CM | POA: Diagnosis not present

## 2021-03-09 DIAGNOSIS — K209 Esophagitis, unspecified without bleeding: Secondary | ICD-10-CM | POA: Diagnosis not present

## 2021-03-09 DIAGNOSIS — I10 Essential (primary) hypertension: Secondary | ICD-10-CM | POA: Diagnosis not present

## 2021-03-09 DIAGNOSIS — R198 Other specified symptoms and signs involving the digestive system and abdomen: Secondary | ICD-10-CM | POA: Diagnosis not present

## 2021-03-09 DIAGNOSIS — F418 Other specified anxiety disorders: Secondary | ICD-10-CM | POA: Diagnosis not present

## 2021-03-09 DIAGNOSIS — F1010 Alcohol abuse, uncomplicated: Secondary | ICD-10-CM | POA: Diagnosis not present

## 2021-03-16 DIAGNOSIS — F17210 Nicotine dependence, cigarettes, uncomplicated: Secondary | ICD-10-CM | POA: Diagnosis not present

## 2021-03-16 DIAGNOSIS — C155 Malignant neoplasm of lower third of esophagus: Secondary | ICD-10-CM | POA: Diagnosis not present

## 2021-03-16 DIAGNOSIS — D001 Carcinoma in situ of esophagus: Secondary | ICD-10-CM | POA: Diagnosis not present

## 2021-03-16 DIAGNOSIS — Q399 Congenital malformation of esophagus, unspecified: Secondary | ICD-10-CM | POA: Diagnosis not present

## 2021-03-16 DIAGNOSIS — K2289 Other specified disease of esophagus: Secondary | ICD-10-CM | POA: Diagnosis not present

## 2021-03-16 DIAGNOSIS — Z90722 Acquired absence of ovaries, bilateral: Secondary | ICD-10-CM | POA: Diagnosis not present

## 2021-03-16 HISTORY — PX: OTHER SURGICAL HISTORY: SHX169

## 2021-03-22 ENCOUNTER — Inpatient Hospital Stay: Admission: RE | Admit: 2021-03-22 | Payer: Medicare HMO | Source: Ambulatory Visit

## 2021-04-02 ENCOUNTER — Encounter: Payer: Self-pay | Admitting: Gastroenterology

## 2021-04-02 NOTE — Progress Notes (Deleted)
Referring Provider: Woody Brooklyn, FNP Primary Care Physician:  Lunsford Brooklyn, FNP Primary GI Physician: Dr. Abbey Chatters  No chief complaint on file.   HPI:   Regina Richardson is a 60 y.o. female presenting today with a history of elevated LFTs, compensated cirrhosis diagnosed October 2021 likely secondary to chronic alcohol abuse (significant serologic work-up unrevealing), GERD, dysphagia s/p dilation of esophageal stenosis and esophageal web November 2021, esophageal squamous dysplasia. She underwent endoscopic submucosal resection at [facility] for severe esophageal dysplasia on 03/16/2021 revealing 2 large plaques in the proximal-mid third of the esophagus s/p endoscopic submucosal resection and retrieval, placement of 4 MR conditional clips.  Normal stomach, normal examined duodenum.  Pathology revealed squamous cell carcinoma in situ focally extending to the peripheral inked margin.  No invasive carcinoma. Recommended repeat EGD in 3 weeks for re-biopsy and possible dilation to prevent esophageal stricture formation.    Colonoscopy up-to-date November 2021.  Last seen in our office 07/07/2020.  She reported new onset postprandial watery diarrhea x1 week.  Cirrhosis remained well compensated.  GERD uncontrolled off PPI due to prior miscommunication.  Dysphagia somewhat improved, but continued with solid food and pill dysphagia. Query whether this was secondary to uncontrolled GERD. Plan included evaluating for infectious diarrhea, celiac screen, update TSH, refer to behavioral health to help with depression/insomnia as this was driving ongoing alcohol use, update BMP, arrange for repeat EGD, resume Protonix 40 mg daily.    TSH within normal limits, celiac screen negative. Stool studies were not completed. BMP with stable hyponatremia.  Procedures detailed below in surgical history.    Today:  Cirrhosis:   Variceal screening: Up-to-date.  No esophageal varices.  Evidence of portal  hypertensive gastropathy. Hepatitis A/B vaccination: Needs to be completed *** HCC screening: Overdue.  Last ultrasound October 2021. *** Routine cirrhosis labs: On file July 2022.  AST 46 (H), ALT 23, alk phos 104, T bili 0.6.  INR 1.2.  Platelets within normal limits. Due for routine labs January 2022. MELD: 8 Child Pugh: A Prior decompensation events: None. No history of ascites, HE, SBP, varices.  Cirrhosis related medications:   GERD:   Diarrhea:   Esophageal squamous cell carcinoma in situ: Due for repeat EGD on 9/15.       Past Medical History:  Diagnosis Date   Alcoholism ([place])    Anxiety    Arrhythmia    heart   Arthritis    Cancer ([place])    cervical cancer in the 80s   Cirrhosis ([place]) 49/7026   alcoholic;    Depression    Dysrhythmia    GERD (gastroesophageal reflux disease)    Hypertension    Osteoporosis    Scoliosis    Urinary incontinence     Past Surgical History:  Procedure Laterality Date   ABDOMINAL HYSTERECTOMY     still has ovaries   BIOPSY  10/11/2020   Procedure: BIOPSY;  Surgeon: Eloise Harman, DO;  [facility];  Service: Endoscopy;;   BIOPSY  12/12/2020   Procedure: BIOPSY;  Surgeon: Irving Copas., MD;  Location: [facility];  Service: Gastroenterology;;   CERVICAL BIOPSY  W/ LOOP ELECTRODE EXCISION     CESAREAN SECTION     COLONOSCOPY  08/2017   Dr. Fuller Plan;  prominent and moderately lipomatous ileocecal valve, 9 mm sessile polyp in hepatic flexure, 8 mm sessile polyp in the descending colon, otherwise normal exam.  Pathology with sessile serrated polyp and hyperplastic polyp.  Recommended  repeat colonoscopy in 5 years.   COLONOSCOPY N/A 06/15/2020   Procedure: COLONOSCOPY;  Surgeon: Rogene Houston, MD; 2 polyps resected and retrieved, 1 small polyp in the distal sigmoid biopsy, external hemorrhoids.  Pathology with 1 tubular adenoma and 1 hyperplastic polyp.    COLPOSCOPY     ESOPHAGEAL DILATION N/A 06/15/2020    Procedure: ESOPHAGEAL DILATION;  Surgeon: Rogene Houston, MD;  [facility];  Service: Endoscopy;  Laterality: N/A;   ESOPHAGOGASTRODUODENOSCOPY  08/2017   Dr. Fuller Plan; LA grade B esophagitis without bleeding s/p biopsied, diffuse moderate inflammation and granularity in the entire examined stomach s/p biopsied, normal examined duodenum s/p biopsy. Gastric biopsy with chronic inactive gastritis without H. pylori, esophageal biopsy with mildly inflamed squamous mucosa, duodenal biopsy benign.   ESOPHAGOGASTRODUODENOSCOPY N/A 06/15/2020   Procedure: ESOPHAGOGASTRODUODENOSCOPY (EGD);  Surgeon: Rogene Houston, MD;  Proximal esophageal web, focal esophagitis at proximal esophagus s/p biopsy, benign-appearing esophageal stenosis s/p dilated, portal hypertensive gastropathy.  Esophageal biopsy with moderate squamous dysplasia.     ESOPHAGOGASTRODUODENOSCOPY (EGD) WITH PROPOFOL N/A 10/11/2020   Procedure: ESOPHAGOGASTRODUODENOSCOPY (EGD) WITH PROPOFOL;  Surgeon: Eloise Harman, DO;  [facility];  Service: Endoscopy;  Laterality: N/A;  am   ESOPHAGOGASTRODUODENOSCOPY (EGD) WITH PROPOFOL N/A 12/12/2020   Procedure: ESOPHAGOGASTRODUODENOSCOPY (EGD) WITH PROPOFOL;  Surgeon: Rush Landmark Telford Nab., MD;  Location: [facility];  Service: Gastroenterology;  Laterality: N/A;   FINGER SURGERY     HEMORRHOID SURGERY     left collar bone surgery     had fx, pin placed and then complitcations caused them to remove most of the bone   left forearm surgery     from left elbow down   UPPER ESOPHAGEAL ENDOSCOPIC ULTRASOUND (EUS) N/A 12/12/2020   Procedure: UPPER ESOPHAGEAL ENDOSCOPIC ULTRASOUND (EUS);  Surgeon: Irving Copas., MD;  Location: [facility];  Service: Gastroenterology;  Laterality: N/A;    Current Outpatient Medications  Medication Sig Dispense Refill   allopurinol (ZYLOPRIM) 300 MG tablet Take 1 tablet (300 mg total) by mouth daily. 90 tablet 1   buPROPion (WELLBUTRIN  SR) 150 MG 12 hr tablet Take 150 mg by mouth 2 (two) times daily.     citalopram (CELEXA) 20 MG tablet TAKE 1 TABLET BY MOUTH EVERY DAY 90 tablet 0   GARLIC PO Take 1 capsule by mouth daily.     hydrOXYzine (ATARAX/VISTARIL) 25 MG tablet Take 25 mg by mouth every 8 (eight) hours as needed for anxiety.     metoprolol succinate (TOPROL-XL) 50 MG 24 hr tablet Take 1 tablet (50 mg total) by mouth daily. 90 tablet 1   pantoprazole (PROTONIX) 40 MG tablet Take 1 tablet (40 mg total) by mouth daily before breakfast. 30 tablet 5   sucralfate (CARAFATE) 1 GM/10ML suspension TAKE 10 MLS (1 G TOTAL) BY MOUTH 2 (TWO) TIMES DAILY. 420 mL 1   traZODone (DESYREL) 100 MG tablet Take 1 tablet (100 mg total) by mouth at bedtime. 90 tablet 1   No current facility-administered medications for this visit.    Allergies as of 04/03/2021   (No Known Allergies)    Family History  Problem Relation Age of Onset   Alcohol abuse Mother    Liver disease Mother        alcoholic cirrhosis   Heart disease Father    Alcohol abuse Father    Lung cancer Father    Diabetes Sister    Cirrhosis Brother        secondary  to alcohol.    Liver disease Brother    Liver disease Sister        Alcoholic cirrhosis   Diabetes Maternal Aunt    Kidney disease Maternal Aunt    Diabetes Cousin        mat cousin   Breast cancer Neg Hx    Colon cancer Neg Hx    Esophageal cancer Neg Hx    Stomach cancer Neg Hx    Rectal cancer Neg Hx    Pancreatic cancer Neg Hx    Inflammatory bowel disease Neg Hx     Social History   Socioeconomic History   Marital status: Married    Spouse name: Not on file   Number of children: 1   Years of education: Not on file   Highest education level: Not on file  Occupational History   Occupation: retired  Tobacco Use   Smoking status: Every Day    Packs/day: 1.50    Years: 40.00    Pack years: 60.00    Types: Cigarettes   Smokeless tobacco: Never  Vaping Use   Vaping Use: Never used   Substance and Sexual Activity   Alcohol use: Yes    Alcohol/week: 12.0 standard drinks    Types: 12 Cans of beer per week    Comment: 12 cans of beer daily   Drug use: No    Comment: Tried cocaine and marijuana in the 60s.    Sexual activity: Not Currently    Birth control/protection: None  Other Topics Concern   Not on file  Social History Narrative   Not on file   Social Determinants of Health   Financial Resource Strain: Not on file  Food Insecurity: Not on file  Transportation Needs: Not on file  Physical Activity: Not on file  Stress: Not on file  Social Connections: Not on file    Review of Systems: Gen: Denies fever, chills, anorexia. Denies fatigue, weakness, weight loss.  CV: Denies chest pain, palpitations, syncope, peripheral edema, and claudication. Resp: Denies dyspnea at rest, cough, wheezing, coughing up blood, and pleurisy. GI: Denies vomiting blood, jaundice, and fecal incontinence.   Denies dysphagia or odynophagia. Derm: Denies rash, itching, dry skin Psych: Denies depression, anxiety, memory loss, confusion. No homicidal or suicidal ideation.  Heme: Denies bruising, bleeding, and enlarged lymph nodes.  Physical Exam: There were no vitals taken for this visit. General:   Alert and oriented. No distress noted. Pleasant and cooperative.  Head:  Normocephalic and atraumatic. Eyes:  Conjuctiva clear without scleral icterus. Mouth:  Oral mucosa pink and moist. Good dentition. No lesions. Heart:  S1, S2 present without murmurs appreciated. Lungs:  Clear to auscultation bilaterally. No wheezes, rales, or rhonchi. No distress.  Abdomen:  +BS, soft, non-tender and non-distended. No rebound or guarding. No HSM or masses noted. Msk:  Symmetrical without gross deformities. Normal posture. Extremities:  Without edema. Neurologic:  Alert and  oriented x4 Psych:  Alert and cooperative. Normal mood and affect.

## 2021-04-03 ENCOUNTER — Encounter: Payer: Self-pay | Admitting: Internal Medicine

## 2021-04-03 ENCOUNTER — Ambulatory Visit: Payer: Medicare HMO | Admitting: Gastroenterology

## 2021-04-11 ENCOUNTER — Other Ambulatory Visit: Payer: Self-pay | Admitting: Family Medicine

## 2021-04-11 DIAGNOSIS — F418 Other specified anxiety disorders: Secondary | ICD-10-CM

## 2021-04-28 ENCOUNTER — Telehealth: Payer: Self-pay

## 2021-04-28 ENCOUNTER — Other Ambulatory Visit: Payer: Self-pay | Admitting: Family Medicine

## 2021-04-28 NOTE — Telephone Encounter (Signed)
Called to schedule mammogram with the mobile unit lmtcb

## 2021-05-02 ENCOUNTER — Encounter: Payer: Self-pay | Admitting: Family Medicine

## 2021-05-02 ENCOUNTER — Other Ambulatory Visit: Payer: Self-pay

## 2021-05-02 ENCOUNTER — Ambulatory Visit (INDEPENDENT_AMBULATORY_CARE_PROVIDER_SITE_OTHER): Payer: Medicare HMO | Admitting: Family Medicine

## 2021-05-02 ENCOUNTER — Other Ambulatory Visit: Payer: Self-pay | Admitting: Family Medicine

## 2021-05-02 VITALS — BP 109/66 | HR 73 | Temp 97.9°F | Ht 61.0 in | Wt 163.6 lb

## 2021-05-02 DIAGNOSIS — F411 Generalized anxiety disorder: Secondary | ICD-10-CM | POA: Insufficient documentation

## 2021-05-02 DIAGNOSIS — F332 Major depressive disorder, recurrent severe without psychotic features: Secondary | ICD-10-CM | POA: Diagnosis not present

## 2021-05-02 DIAGNOSIS — K219 Gastro-esophageal reflux disease without esophagitis: Secondary | ICD-10-CM | POA: Diagnosis not present

## 2021-05-02 DIAGNOSIS — M545 Low back pain, unspecified: Secondary | ICD-10-CM

## 2021-05-02 DIAGNOSIS — G4700 Insomnia, unspecified: Secondary | ICD-10-CM | POA: Diagnosis not present

## 2021-05-02 DIAGNOSIS — I10 Essential (primary) hypertension: Secondary | ICD-10-CM

## 2021-05-02 DIAGNOSIS — R002 Palpitations: Secondary | ICD-10-CM

## 2021-05-02 DIAGNOSIS — R0681 Apnea, not elsewhere classified: Secondary | ICD-10-CM

## 2021-05-02 DIAGNOSIS — M4126 Other idiopathic scoliosis, lumbar region: Secondary | ICD-10-CM | POA: Diagnosis not present

## 2021-05-02 DIAGNOSIS — Z23 Encounter for immunization: Secondary | ICD-10-CM

## 2021-05-02 DIAGNOSIS — R0602 Shortness of breath: Secondary | ICD-10-CM

## 2021-05-02 DIAGNOSIS — G8929 Other chronic pain: Secondary | ICD-10-CM

## 2021-05-02 MED ORDER — BUPROPION HCL ER (SR) 150 MG PO TB12: 150.0000 mg | ORAL_TABLET | Freq: Two times a day (BID) | ORAL | 2 refills | Status: DC

## 2021-05-02 MED ORDER — CITALOPRAM HYDROBROMIDE 20 MG PO TABS: 20.0000 mg | ORAL_TABLET | Freq: Every day | ORAL | 2 refills | Status: DC

## 2021-05-02 MED ORDER — PANTOPRAZOLE SODIUM 40 MG PO TBEC: 40.0000 mg | DELAYED_RELEASE_TABLET | Freq: Every day | ORAL | 1 refills | Status: DC

## 2021-05-02 MED ORDER — DICLOFENAC SODIUM 75 MG PO TBEC
75.0000 mg | DELAYED_RELEASE_TABLET | Freq: Two times a day (BID) | ORAL | 2 refills | Status: DC | PRN
Start: 2021-05-02 — End: 2021-08-07

## 2021-05-02 MED ORDER — METHOCARBAMOL 500 MG PO TABS: 500.0000 mg | ORAL_TABLET | Freq: Three times a day (TID) | ORAL | 2 refills | Status: DC | PRN

## 2021-05-02 NOTE — Progress Notes (Signed)
Assessment & Plan:  1. Essential hypertension Well controlled on current regimen.  - CBC with Differential/Platelet - CMP14+EGFR - Lipid panel  2. Gastro-esophageal reflux disease without esophagitis Well controlled on current regimen.  - pantoprazole (PROTONIX) 40 MG tablet; Take 1 tablet (40 mg total) by mouth daily before breakfast.  Dispense: 90 tablet; Refill: 1 - CMP14+EGFR  3. Hypomagnesemia Labs to assess. - Magnesium  4. Major depressive disorder, recurrent severe without psychotic features ([place]) Uncontrolled. Advised to start medication previously ordered and resume Wellbutrin. - CMP14+EGFR - buPROPion (WELLBUTRIN SR) 150 MG 12 hr tablet; Take 1 tablet (150 mg total) by mouth 2 (two) times daily.  Dispense: 60 tablet; Refill: 2  5. Generalized anxiety disorder Uncontrolled. Advised to start medication previously ordered. - CMP14+EGFR  6. Insomnia, unspecified type Well controlled on current regimen.  - CMP14+EGFR  7. Chronic midline low back pain without sciatica Uncontrolled. Started diclofenac and Robaxin.  - diclofenac (VOLTAREN) 75 MG EC tablet; Take 1 tablet (75 mg total) by mouth 2 (two) times daily as needed.  Dispense: 60 tablet; Refill: 2 - methocarbamol (ROBAXIN) 500 MG tablet; Take 1 tablet (500 mg total) by mouth every 8 (eight) hours as needed for muscle spasms.  Dispense: 60 tablet; Refill: 2  8. Other idiopathic scoliosis, lumbar region Uncontrolled. Started diclofenac and Robaxin.  - diclofenac (VOLTAREN) 75 MG EC tablet; Take 1 tablet (75 mg total) by mouth 2 (two) times daily as needed.  Dispense: 60 tablet; Refill: 2 - methocarbamol (ROBAXIN) 500 MG tablet; Take 1 tablet (500 mg total) by mouth every 8 (eight) hours as needed for muscle spasms.  Dispense: 60 tablet; Refill: 2  9. Palpitations - CBC with Differential/Platelet - CMP14+EGFR - TSH - Ambulatory referral to Cardiology  10. Exertional shortness of breath - CBC with  Differential/Platelet - Ambulatory referral to Cardiology  11. Apnea - Ambulatory referral to Pulmonology  12. Need for immunization against influenza - Flu Vaccine QUAD 81moIM (Fluarix, Fluzone & Alfiuria Quad PF) - given in office.   Return in about 6 weeks (around 06/13/2021) for follow-up of chronic medication conditions.  BHendricks Limes MSN, APRN, FNP-C [facility]  Subjective:    Patient ID: Regina Richardson female    DOB: 1Dec 18, 1962 60y.o.   MRN: [id number] Patient Care Team: JLoman Brooklyn FNP as PCP - General (Family Medicine) CEloise Harman DO as Consulting Physician (Internal Medicine)   Chief Complaint:  Chief Complaint  Patient presents with   Depression   Hyperlipidemia    Check up of chronic medical conditions    discolored lower back     X 1 month    Shortness of Breath    And tachy x 1 month     HPI: Regina CONDEis a 60y.o. female presenting on 05/02/2021 for Depression, Hyperlipidemia (Check up of chronic medical conditions ), discolored lower back  (X 1 month ), and Shortness of Breath (And tachy x 1 month )  Depression/Anxiety: patient was started on Celexa at our last visit; she does not believe she ever picked this up from the pharmacy, but is not sure. She has been out of her Wellbutrin for a couple of weeks.   Insomnia: taking Trazodone at bedtime.  GERD: taking pantoprazole daily.   New complaints: Patient reports discoloration to her lower back that she noticed one month ago. Denies itching or pain. She has frequently been applying a heating pad to her low back to  help with her chronic pain. She often falls asleep on the heating pad at night, but states it automatically turns off after 90 minutes. Her back pain is constant and has been present for years. She has scoliosis and DDD. States the pain is made worse by prolonged sitting or standing. She reports in the past she has had injections in her spine that only  have her an hour of relief. She has also had a muscle relaxer in the past that was somewhat helpful. She is not currently taking anything for the pain. States she was told in the past she needed surgery, but does not wish to do so.   Patient also reports that her heart rate increases with activity, such as going to the mailbox. She gets short of breath when this occurs as well. This occurs daily. It is unclear how long this has been going on - at one point she said one month and another she said years.   Patient reports when she had throat surgery she was advised that she needs a sleep study due to periods of apnea.   Social history:  Relevant past medical, surgical, family and social history reviewed and updated as indicated. Interim medical history since our last visit reviewed.  Allergies and medications reviewed and updated.  DATA REVIEWED: CHART IN EPIC  ROS: Negative unless specifically indicated above in HPI.    Current Outpatient Medications:    allopurinol (ZYLOPRIM) 300 MG tablet, Take 1 tablet (300 mg total) by mouth daily., Disp: 90 tablet, Rfl: 1   buPROPion (WELLBUTRIN SR) 150 MG 12 hr tablet, Take 150 mg by mouth 2 (two) times daily., Disp: , Rfl:    citalopram (CELEXA) 20 MG tablet, TAKE 1 TABLET BY MOUTH EVERY DAY, Disp: 90 tablet, Rfl: 0   GARLIC PO, Take 1 capsule by mouth daily., Disp: , Rfl:    hydrOXYzine (ATARAX/VISTARIL) 25 MG tablet, Take 25 mg by mouth every 8 (eight) hours as needed for anxiety., Disp: , Rfl:    metoprolol succinate (TOPROL-XL) 50 MG 24 hr tablet, Take 1 tablet (50 mg total) by mouth daily., Disp: 90 tablet, Rfl: 1   traZODone (DESYREL) 100 MG tablet, Take 1 tablet (100 mg total) by mouth at bedtime., Disp: 90 tablet, Rfl: 1   pantoprazole (PROTONIX) 40 MG tablet, Take 1 tablet (40 mg total) by mouth daily before breakfast., Disp: 30 tablet, Rfl: 5   No Known Allergies Past Medical History:  Diagnosis Date   Alcoholism ([place])    Anxiety     Arrhythmia    heart   Arthritis    Cancer ([place])    cervical cancer in the 80s   Cirrhosis ([place]) 96/0454   alcoholic;    Depression    Dysrhythmia    GERD (gastroesophageal reflux disease)    Hypertension    Osteoporosis    Scoliosis    Urinary incontinence     Past Surgical History:  Procedure Laterality Date   ABDOMINAL HYSTERECTOMY     still has ovaries   BIOPSY  10/11/2020   Procedure: BIOPSY;  Surgeon: Eloise Harman, DO;  [facility];  Service: Endoscopy;;   BIOPSY  12/12/2020   Procedure: BIOPSY;  Surgeon: Irving Copas., MD;  Location: [facility];  Service: Gastroenterology;;   CERVICAL BIOPSY  W/ LOOP ELECTRODE EXCISION     CESAREAN SECTION     COLONOSCOPY  08/2017   Dr. Fuller Plan;  prominent and moderately lipomatous ileocecal valve, 9 mm  sessile polyp in hepatic flexure, 8 mm sessile polyp in the descending colon, otherwise normal exam.  Pathology with sessile serrated polyp and hyperplastic polyp.  Recommended repeat colonoscopy in 5 years.   COLONOSCOPY N/A 06/15/2020   Procedure: COLONOSCOPY;  Surgeon: Rogene Houston, MD; 2 polyps resected and retrieved, 1 small polyp in the distal sigmoid biopsy, external hemorrhoids.  Pathology with 1 tubular adenoma and 1 hyperplastic polyp.    COLPOSCOPY     EGD with mucosal resection  03/16/2021   [facility]: 2 large plaques in the proximal-mid third of the esophagus s/p endoscopic submucosal resection and retrieval, placement of 4 MR conditional clips.  Normal stomach, normal examined duodenum.  Pathology revealed squamous cell carcinoma in situ focally extending to the peripheral inked margin.  No invasive carcinoma. Recommended repeat EGD in 3 weeks.   ESOPHAGEAL DILATION N/A 06/15/2020   Procedure: ESOPHAGEAL DILATION;  Surgeon: Rogene Houston, MD;  [facility];  Service: Endoscopy;  Laterality: N/A;   ESOPHAGOGASTRODUODENOSCOPY  08/2017   Dr. Fuller Plan; LA grade B esophagitis without bleeding s/p  biopsied, diffuse moderate inflammation and granularity in the entire examined stomach s/p biopsied, normal examined duodenum s/p biopsy. Gastric biopsy with chronic inactive gastritis without H. pylori, esophageal biopsy with mildly inflamed squamous mucosa, duodenal biopsy benign.   ESOPHAGOGASTRODUODENOSCOPY N/A 06/15/2020   Procedure: ESOPHAGOGASTRODUODENOSCOPY (EGD);  Surgeon: Rogene Houston, MD;  Proximal esophageal web, focal esophagitis at proximal esophagus s/p biopsy, benign-appearing esophageal stenosis s/p dilated, portal hypertensive gastropathy.  Esophageal biopsy with moderate squamous dysplasia.     ESOPHAGOGASTRODUODENOSCOPY (EGD) WITH PROPOFOL N/A 10/11/2020   Surgeon: Eloise Harman, DO; Eroded, inflamed mucosa in the esophagus. Biopsied. Portal hypertensive gastropathy.  Recommended PPI twice daily.  Pathology with moderate to severe squamous dysplasia.   ESOPHAGOGASTRODUODENOSCOPY (EGD) WITH PROPOFOL N/A 12/12/2020   Surgeon: Irving Copas., MD; Dionisio David, granular, mucosa in the esophagus at 23-26 cm.  Lesion 3 cm in length and encompassed 30-40% of esophageal wall s/p quick biopsy.  Erythematous mucosa in the stomach biopsied, portal hypertensive gastropathy.  Gastric biopsy with mild chronic inflammation, negative for H. pylori.  Esophageal biopsy with moderate to severe squamous dysplasia.   FINGER SURGERY     HEMORRHOID SURGERY     left collar bone surgery     had fx, pin placed and then complitcations caused them to remove most of the bone   left forearm surgery     from left elbow down   UPPER ESOPHAGEAL ENDOSCOPIC ULTRASOUND (EUS) N/A 12/12/2020   Surgeon: Irving Copas., MD;without esophageal wall thickening, 1 lymph node in the middle paraesophageal mediastinum that was suggestive of benign inflammatory changes, pancreatic lobularity in pancreatic body, queried possibility of chronic pancreatitis though complete EUS not performed and no history of  pancreatitis.    Social History   Socioeconomic History   Marital status: Married    Spouse name: Not on file   Number of children: 1   Years of education: Not on file   Highest education level: Not on file  Occupational History   Occupation: retired  Tobacco Use   Smoking status: Every Day    Packs/day: 1.50    Years: 40.00    Pack years: 60.00    Types: Cigarettes   Smokeless tobacco: Never  Vaping Use   Vaping Use: Never used  Substance and Sexual Activity   Alcohol use: Yes    Alcohol/week: 12.0 standard drinks    Types: 12 Cans of beer  per week    Comment: 12 cans of beer daily   Drug use: No    Comment: Tried cocaine and marijuana in the 60s.    Sexual activity: Not Currently    Birth control/protection: None  Other Topics Concern   Not on file  Social History Narrative   Not on file   Social Determinants of Health   Financial Resource Strain: Not on file  Food Insecurity: Not on file  Transportation Needs: Not on file  Physical Activity: Not on file  Stress: Not on file  Social Connections: Not on file  Intimate Partner Violence: Not on file        Objective:    BP 109/66   Pulse 73   Temp 97.9 F (36.6 C) (Temporal)   Ht 5' 1"  (1.549 m)   Wt 163 lb 9.6 oz (74.2 kg)   SpO2 98%   BMI 30.91 kg/m   Wt Readings from Last 3 Encounters:  05/02/21 163 lb 9.6 oz (74.2 kg)  12/12/20 160 lb (72.6 kg)  12/01/20 166 lb 4 oz (75.4 kg)    Physical Exam Vitals reviewed.  Constitutional:      General: She is not in acute distress.    Appearance: Normal appearance. She is obese. She is not ill-appearing, toxic-appearing or diaphoretic.  HENT:     Head: Normocephalic and atraumatic.  Eyes:     General: No scleral icterus.       Right eye: No discharge.        Left eye: No discharge.     Conjunctiva/sclera: Conjunctivae normal.  Cardiovascular:     Rate and Rhythm: Normal rate and regular rhythm.     Heart sounds: Normal heart sounds. No murmur  heard.   No friction rub. No gallop.  Pulmonary:     Effort: Pulmonary effort is normal. No respiratory distress.     Breath sounds: Normal breath sounds. No stridor. No wheezing, rhonchi or rales.  Musculoskeletal:        General: Normal range of motion.     Cervical back: Normal range of motion.  Skin:    General: Skin is warm and dry.     Capillary Refill: Capillary refill takes less than 2 seconds.  Neurological:     General: No focal deficit present.     Mental Status: She is alert and oriented to person, place, and time. Mental status is at baseline.  Psychiatric:        Mood and Affect: Mood normal.        Behavior: Behavior normal.        Thought Content: Thought content normal.        Judgment: Judgment normal.    Lab Results  Component Value Date   TSH 3.360 09/05/2020   Lab Results  Component Value Date   WBC 10.6 (H) 12/01/2020   HGB 12.6 12/01/2020   HCT 37.5 12/01/2020   MCV 95.4 12/01/2020   PLT 305.0 12/01/2020   Lab Results  Component Value Date   NA 137 12/01/2020   K 4.2 12/01/2020   CO2 31 12/01/2020   GLUCOSE 93 12/01/2020   BUN 6 12/01/2020   CREATININE 0.54 12/01/2020   BILITOT 0.8 12/01/2020   ALKPHOS 124 (H) 12/01/2020   AST 38 (H) 12/01/2020   ALT 19 12/01/2020   PROT 8.3 12/01/2020   ALBUMIN 4.2 12/01/2020   CALCIUM 9.8 12/01/2020   ANIONGAP 6 06/15/2020   GFR 100.04 12/01/2020   Lab Results  Component Value Date   CHOL 250 (H) 03/08/2020   Lab Results  Component Value Date   HDL 65 03/08/2020   Lab Results  Component Value Date   LDLCALC 166 (H) 03/08/2020   Lab Results  Component Value Date   TRIG 107 03/08/2020   Lab Results  Component Value Date   CHOLHDL 3.8 03/08/2020   No results found for: HGBA1C

## 2021-05-03 LAB — LIPID PANEL
Chol/HDL Ratio: 3.3 ratio (ref 0.0–4.4)
Cholesterol, Total: 226 mg/dL — ABNORMAL HIGH (ref 100–199)
HDL: 69 mg/dL (ref >39)
LDL Chol Calc (NIH): 137 mg/dL — ABNORMAL HIGH (ref 0–99)
Triglycerides: 113 mg/dL (ref 0–149)
VLDL Cholesterol Cal: 20 mg/dL (ref 5–40)

## 2021-05-03 LAB — CMP14+EGFR
ALT: 30 IU/L (ref 0–32)
AST: 75 IU/L — ABNORMAL HIGH (ref 0–40)
Albumin/Globulin Ratio: 1.2 (ref 1.2–2.2)
Albumin: 4.3 g/dL (ref 3.8–4.9)
Alkaline Phosphatase: 163 IU/L — ABNORMAL HIGH (ref 44–121)
BUN/Creatinine Ratio: 7 — ABNORMAL LOW (ref 12–28)
BUN: 4 mg/dL — ABNORMAL LOW (ref 8–27)
Bilirubin Total: 0.4 mg/dL (ref 0.0–1.2)
CO2: 20 mmol/L (ref 20–29)
Calcium: 9.2 mg/dL (ref 8.7–10.3)
Chloride: 91 mmol/L — ABNORMAL LOW (ref 96–106)
Creatinine, Ser: 0.56 mg/dL — ABNORMAL LOW (ref 0.57–1.00)
Globulin, Total: 3.5 g/dL (ref 1.5–4.5)
Glucose: 116 mg/dL — ABNORMAL HIGH (ref 70–99)
Potassium: 4 mmol/L (ref 3.5–5.2)
Sodium: 129 mmol/L — ABNORMAL LOW (ref 134–144)
Total Protein: 7.8 g/dL (ref 6.0–8.5)
eGFR: 104 mL/min/{1.73_m2} (ref >59)

## 2021-05-03 LAB — CBC WITH DIFFERENTIAL/PLATELET
Basophils Absolute: 0.2 10*3/uL (ref 0.0–0.2)
Basos: 2 %
EOS (ABSOLUTE): 0.2 10*3/uL (ref 0.0–0.4)
Eos: 2 %
Hematocrit: 36 % (ref 34.0–46.6)
Hemoglobin: 12.9 g/dL (ref 11.1–15.9)
Immature Grans (Abs): 0.1 10*3/uL (ref 0.0–0.1)
Immature Granulocytes: 1 %
Lymphocytes Absolute: 3.5 10*3/uL — ABNORMAL HIGH (ref 0.7–3.1)
Lymphs: 33 %
MCH: 31.9 pg (ref 26.6–33.0)
MCHC: 35.8 g/dL — ABNORMAL HIGH (ref 31.5–35.7)
MCV: 89 fL (ref 79–97)
Monocytes Absolute: 0.8 10*3/uL (ref 0.1–0.9)
Monocytes: 7 %
Neutrophils Absolute: 5.9 10*3/uL (ref 1.4–7.0)
Neutrophils: 55 %
Platelets: 346 10*3/uL (ref 150–450)
RBC: 4.04 x10E6/uL (ref 3.77–5.28)
RDW: 11.9 % (ref 11.7–15.4)
WBC: 10.7 10*3/uL (ref 3.4–10.8)

## 2021-05-03 LAB — TSH: TSH: 0.791 u[IU]/mL (ref 0.450–4.500)

## 2021-05-03 LAB — MAGNESIUM: Magnesium: 1.8 mg/dL (ref 1.6–2.3)

## 2021-05-04 ENCOUNTER — Encounter: Payer: Self-pay | Admitting: Family Medicine

## 2021-05-08 DIAGNOSIS — K7030 Alcoholic cirrhosis of liver without ascites: Secondary | ICD-10-CM | POA: Insufficient documentation

## 2021-05-08 NOTE — Progress Notes (Deleted)
Cardiology Office Note:    Date:  05/08/2021   ID:  Regina Richardson, DOB Jan 02, 1961, MRN [id number]  PCP:  Elem Brooklyn, FNP   [facility] Providers Cardiologist:  None { Click to update primary MD,subspecialty MD or APP then REFRESH:1}    Referring MD: Dickmann Brooklyn, FNP   CC: *** Consulted for the evaluation of palpitations at the behest of Ishikawa Brooklyn, FNP   History of Present Illness:    Regina Richardson is a 60 y.o. female with a hx of HTN, Hepatic Cirrhosis, Alcohol Abuse, Squamous Cell Carcinoma, Esophagitis, active tobacco and alcohol use who presents 05/09/21.  Patient notes that (s)he is feeling ***.  Has had no chest pain, chest pressure, chest tightness, chest stinging ***.  Discomfort occurs with ***, worsens with ***, and improves with ***.  Patient exertion notable for *** with *** and feels no symptoms.  No shortness of breath, DOE ***.  No PND or orthopnea***.  No weight gain***, leg swelling ***, or abdominal swelling***.  No syncope or near syncope ***. Notes *** no palpitations or funny heart beats.   No leg claudication.  Patient reports prior cardiac testing including *** echo, *** stress test, *** heart catheterizations, *** cardioversion, *** ablations.  No history of ***pre-eclampsia, gestation HTN or gestational DM.  No Fen-Phen or drug use***.  Ambulatory BP ***.   Past Medical History:  Diagnosis Date   Alcoholism ([place])    Anxiety    Arrhythmia    heart   Arthritis    Cancer ([place])    cervical cancer in the 80s   Cirrhosis ([place]) 00/9233   alcoholic;    Depression    Dysrhythmia    GERD (gastroesophageal reflux disease)    Hypertension    Osteoporosis    Scoliosis    Urinary incontinence     Past Surgical History:  Procedure Laterality Date   ABDOMINAL HYSTERECTOMY     still has ovaries   BIOPSY  10/11/2020   Procedure: BIOPSY;  Surgeon: Eloise Harman, DO;  [facility];  Service: Endoscopy;;   BIOPSY   12/12/2020   Procedure: BIOPSY;  Surgeon: Irving Copas., MD;  Location: [facility];  Service: Gastroenterology;;   CERVICAL BIOPSY  W/ LOOP ELECTRODE EXCISION     CESAREAN SECTION     COLONOSCOPY  08/2017   Dr. Fuller Plan;  prominent and moderately lipomatous ileocecal valve, 9 mm sessile polyp in hepatic flexure, 8 mm sessile polyp in the descending colon, otherwise normal exam.  Pathology with sessile serrated polyp and hyperplastic polyp.  Recommended repeat colonoscopy in 5 years.   COLONOSCOPY N/A 06/15/2020   Procedure: COLONOSCOPY;  Surgeon: Rogene Houston, MD; 2 polyps resected and retrieved, 1 small polyp in the distal sigmoid biopsy, external hemorrhoids.  Pathology with 1 tubular adenoma and 1 hyperplastic polyp.    COLPOSCOPY     EGD with mucosal resection  03/16/2021   [facility]: 2 large plaques in the proximal-mid third of the esophagus s/p endoscopic submucosal resection and retrieval, placement of 4 MR conditional clips.  Normal stomach, normal examined duodenum.  Pathology revealed squamous cell carcinoma in situ focally extending to the peripheral inked margin.  No invasive carcinoma. Recommended repeat EGD in 3 weeks.   ESOPHAGEAL DILATION N/A 06/15/2020   Procedure: ESOPHAGEAL DILATION;  Surgeon: Rogene Houston, MD;  [facility];  Service: Endoscopy;  Laterality: N/A;   ESOPHAGOGASTRODUODENOSCOPY  08/2017   Dr. Fuller Plan; LA  grade B esophagitis without bleeding s/p biopsied, diffuse moderate inflammation and granularity in the entire examined stomach s/p biopsied, normal examined duodenum s/p biopsy. Gastric biopsy with chronic inactive gastritis without H. pylori, esophageal biopsy with mildly inflamed squamous mucosa, duodenal biopsy benign.   ESOPHAGOGASTRODUODENOSCOPY N/A 06/15/2020   Procedure: ESOPHAGOGASTRODUODENOSCOPY (EGD);  Surgeon: Rogene Houston, MD;  Proximal esophageal web, focal esophagitis at proximal esophagus s/p biopsy, benign-appearing  esophageal stenosis s/p dilated, portal hypertensive gastropathy.  Esophageal biopsy with moderate squamous dysplasia.     ESOPHAGOGASTRODUODENOSCOPY (EGD) WITH PROPOFOL N/A 10/11/2020   Surgeon: Eloise Harman, DO; Eroded, inflamed mucosa in the esophagus. Biopsied. Portal hypertensive gastropathy.  Recommended PPI twice daily.  Pathology with moderate to severe squamous dysplasia.   ESOPHAGOGASTRODUODENOSCOPY (EGD) WITH PROPOFOL N/A 12/12/2020   Surgeon: Irving Copas., MD; Dionisio David, granular, mucosa in the esophagus at 23-26 cm.  Lesion 3 cm in length and encompassed 30-40% of esophageal wall s/p quick biopsy.  Erythematous mucosa in the stomach biopsied, portal hypertensive gastropathy.  Gastric biopsy with mild chronic inflammation, negative for H. pylori.  Esophageal biopsy with moderate to severe squamous dysplasia.   FINGER SURGERY     HEMORRHOID SURGERY     left collar bone surgery     had fx, pin placed and then complitcations caused them to remove most of the bone   left forearm surgery     from left elbow down   UPPER ESOPHAGEAL ENDOSCOPIC ULTRASOUND (EUS) N/A 12/12/2020   Surgeon: Irving Copas., MD;without esophageal wall thickening, 1 lymph node in the middle paraesophageal mediastinum that was suggestive of benign inflammatory changes, pancreatic lobularity in pancreatic body, queried possibility of chronic pancreatitis though complete EUS not performed and no history of pancreatitis.    Current Medications: No outpatient medications have been marked as taking for the 05/09/21 encounter (Appointment) with Werner Lean, MD.     Allergies:   Patient has no known allergies.   Social History   Socioeconomic History   Marital status: Married    Spouse name: Not on file   Number of children: 1   Years of education: Not on file   Highest education level: Not on file  Occupational History   Occupation: retired  Tobacco Use   Smoking status: Every  Day    Packs/day: 1.50    Years: 40.00    Pack years: 60.00    Types: Cigarettes   Smokeless tobacco: Never  Vaping Use   Vaping Use: Never used  Substance and Sexual Activity   Alcohol use: Yes    Alcohol/week: 12.0 standard drinks    Types: 12 Cans of beer per week    Comment: 12 cans of beer daily   Drug use: No    Comment: Tried cocaine and marijuana in the 60s.    Sexual activity: Not Currently    Birth control/protection: None  Other Topics Concern   Not on file  Social History Narrative   Not on file   Social Determinants of Health   Financial Resource Strain: Not on file  Food Insecurity: Not on file  Transportation Needs: Not on file  Physical Activity: Not on file  Stress: Not on file  Social Connections: Not on file     Family History: The patient's ***family history includes Alcohol abuse in her father and mother; Cirrhosis in her brother; Diabetes in her cousin, maternal aunt, and sister; Heart disease in her father; Kidney disease in her maternal aunt; Liver disease in her  brother, mother, and sister; Lung cancer in her father. There is no history of Breast cancer, Colon cancer, Esophageal cancer, Stomach cancer, Rectal cancer, Pancreatic cancer, or Inflammatory bowel disease.  ROS:   Please see the history of present illness.    *** All other systems reviewed and are negative.  EKGs/Labs/Other Studies Reviewed:    The following studies were reviewed today: ***  EKG:  EKG is *** ordered today.  The ekg ordered today demonstrates *** 05/09/21: ***  Recent Labs: 05/02/2021: ALT 30; BUN 4; Creatinine, Ser 0.56; Hemoglobin 12.9; Magnesium 1.8; Platelets 346; Potassium 4.0; Sodium 129; TSH 0.791  Recent Lipid Panel    Component Value Date/Time   CHOL 226 (H) 05/02/2021 1542   TRIG 113 05/02/2021 1542   HDL 69 05/02/2021 1542   CHOLHDL 3.3 05/02/2021 1542   CHOLHDL 4.0 05/04/2019 1108   VLDL 16 09/07/2015 1110   LDLCALC 137 (H) 05/02/2021 1542    LDLCALC 145 (H) 05/04/2019 1108     Risk Assessment/Calculations:   {Does this patient have ATRIAL FIBRILLATION?[phone]}       Physical Exam:    VS:  There were no vitals taken for this visit.    Wt Readings from Last 3 Encounters:  05/02/21 163 lb 9.6 oz (74.2 kg)  12/12/20 160 lb (72.6 kg)  12/01/20 166 lb 4 oz (75.4 kg)     GEN: *** Well nourished, well developed in no acute distress HEENT: Normal NECK: No JVD; No carotid bruits LYMPHATICS: No lymphadenopathy CARDIAC: ***RRR, no murmurs, rubs, gallops RESPIRATORY:  Clear to auscultation without rales, wheezing or rhonchi  ABDOMEN: Soft, non-tender, non-distended MUSCULOSKELETAL:  No edema; No deformity  SKIN: Warm and dry NEUROLOGIC:  Alert and oriented x 3 PSYCHIATRIC:  Normal affect   ASSESSMENT:    No diagnosis found. PLAN:    Palpitations; possible SVT History of alcohol and tobacco use Histry of GI bleed, cirrhosis, and multiple EGDs - will obtain ***-day live/non live heart monitor (ZioPatch/Pre[employer]ice) - AV Nodal Therapy: *** - AAD: - EP evaluation:      {Are you ordering a CV Procedure (e.g. stress test, cath, DCCV, TEE, etc)?   Press F2        :[id number]}    Medication Adjustments/Labs and Tests Ordered: Current medicines are reviewed at length with the patient today.  Concerns regarding medicines are outlined above.  No orders of the defined types were placed in this encounter.  No orders of the defined types were placed in this encounter.   There are no Patient Instructions on file for this visit.   Signed, Werner Lean, MD  05/08/2021 12:13 PM    [place]

## 2021-05-09 ENCOUNTER — Ambulatory Visit: Payer: Medicare HMO | Admitting: Internal Medicine

## 2021-05-15 DIAGNOSIS — Z79899 Other long term (current) drug therapy: Secondary | ICD-10-CM | POA: Diagnosis not present

## 2021-05-15 DIAGNOSIS — Z8719 Personal history of other diseases of the digestive system: Secondary | ICD-10-CM | POA: Diagnosis not present

## 2021-05-15 DIAGNOSIS — F418 Other specified anxiety disorders: Secondary | ICD-10-CM | POA: Diagnosis not present

## 2021-05-15 DIAGNOSIS — F17210 Nicotine dependence, cigarettes, uncomplicated: Secondary | ICD-10-CM | POA: Diagnosis not present

## 2021-05-15 DIAGNOSIS — K7030 Alcoholic cirrhosis of liver without ascites: Secondary | ICD-10-CM | POA: Diagnosis not present

## 2021-05-15 DIAGNOSIS — R1310 Dysphagia, unspecified: Secondary | ICD-10-CM | POA: Diagnosis not present

## 2021-05-15 DIAGNOSIS — Z9889 Other specified postprocedural states: Secondary | ICD-10-CM | POA: Diagnosis not present

## 2021-05-15 DIAGNOSIS — K2289 Other specified disease of esophagus: Secondary | ICD-10-CM | POA: Diagnosis not present

## 2021-05-15 DIAGNOSIS — K222 Esophageal obstruction: Secondary | ICD-10-CM | POA: Diagnosis not present

## 2021-05-15 DIAGNOSIS — I10 Essential (primary) hypertension: Secondary | ICD-10-CM | POA: Diagnosis not present

## 2021-05-24 ENCOUNTER — Other Ambulatory Visit: Payer: Self-pay | Admitting: Family Medicine

## 2021-05-24 DIAGNOSIS — F332 Major depressive disorder, recurrent severe without psychotic features: Secondary | ICD-10-CM

## 2021-06-08 ENCOUNTER — Encounter: Payer: Self-pay | Admitting: Family Medicine

## 2021-06-08 ENCOUNTER — Other Ambulatory Visit: Payer: Self-pay

## 2021-06-08 ENCOUNTER — Ambulatory Visit (INDEPENDENT_AMBULATORY_CARE_PROVIDER_SITE_OTHER): Payer: Medicare HMO | Admitting: Family Medicine

## 2021-06-08 VITALS — BP 126/69 | HR 62 | Temp 97.9°F | Ht 61.0 in | Wt 165.8 lb

## 2021-06-08 DIAGNOSIS — R0602 Shortness of breath: Secondary | ICD-10-CM | POA: Diagnosis not present

## 2021-06-08 DIAGNOSIS — I10 Essential (primary) hypertension: Secondary | ICD-10-CM | POA: Diagnosis not present

## 2021-06-08 DIAGNOSIS — F411 Generalized anxiety disorder: Secondary | ICD-10-CM | POA: Diagnosis not present

## 2021-06-08 DIAGNOSIS — E790 Hyperuricemia without signs of inflammatory arthritis and tophaceous disease: Secondary | ICD-10-CM

## 2021-06-08 DIAGNOSIS — S39012A Strain of muscle, fascia and tendon of lower back, initial encounter: Secondary | ICD-10-CM

## 2021-06-08 DIAGNOSIS — F332 Major depressive disorder, recurrent severe without psychotic features: Secondary | ICD-10-CM | POA: Diagnosis not present

## 2021-06-08 DIAGNOSIS — R002 Palpitations: Secondary | ICD-10-CM

## 2021-06-08 DIAGNOSIS — F5101 Primary insomnia: Secondary | ICD-10-CM

## 2021-06-08 DIAGNOSIS — R0681 Apnea, not elsewhere classified: Secondary | ICD-10-CM | POA: Diagnosis not present

## 2021-06-08 MED ORDER — TRAZODONE HCL 100 MG PO TABS: 100.0000 mg | ORAL_TABLET | Freq: Every day | ORAL | 1 refills | Status: DC

## 2021-06-08 MED ORDER — PREDNISONE 10 MG (21) PO TBPK: ORAL_TABLET | ORAL | 0 refills | Status: DC

## 2021-06-08 MED ORDER — ALLOPURINOL 300 MG PO TABS: 300.0000 mg | ORAL_TABLET | Freq: Every day | ORAL | 1 refills | Status: DC

## 2021-06-08 MED ORDER — METOPROLOL SUCCINATE ER 50 MG PO TB24: 50.0000 mg | ORAL_TABLET | Freq: Every day | ORAL | 1 refills | Status: DC

## 2021-06-08 NOTE — Progress Notes (Signed)
Assessment & Plan:  1. Major depressive disorder, recurrent severe without psychotic features ([place]) Uncontrolled. Gene Sight testing collected today.  - traZODone (DESYREL) 100 MG tablet; Take 1 tablet (100 mg total) by mouth at bedtime.  Dispense: 90 tablet; Refill: 1  2. Generalized anxiety disorder Uncontrolled. Gene Sight testing collected today.  - traZODone (DESYREL) 100 MG tablet; Take 1 tablet (100 mg total) by mouth at bedtime.  Dispense: 90 tablet; Refill: 1  3. Primary insomnia Well controlled on current regimen.  - traZODone (DESYREL) 100 MG tablet; Take 1 tablet (100 mg total) by mouth at bedtime.  Dispense: 90 tablet; Refill: 1  4. Low back strain, initial encounter Education provided on low back strain exercises. - predniSONE (STERAPRED UNI-PAK 21 TAB) 10 MG (21) TBPK tablet; As directed x 6 days  Dispense: 21 tablet; Refill: 0  5. Palpitations Patient did not schedule appointment with cardiology. Will address again once we get depression under control.   6. Exertional shortness of breath Patient did not schedule appointment with cardiology. Will address again once we get depression under control.   7. Apnea Patient to keep appointment with pulmonology next month.  8. Essential hypertension Well controlled on current regimen.  - metoprolol succinate (TOPROL-XL) 50 MG 24 hr tablet; Take 1 tablet (50 mg total) by mouth daily.  Dispense: 90 tablet; Refill: 1  9. Hyperuricemia Well controlled on current regimen.  - allopurinol (ZYLOPRIM) 300 MG tablet; Take 1 tablet (300 mg total) by mouth daily.  Dispense: 90 tablet; Refill: 1   Return in about 2 months (around 08/08/2021) for Anxiety/Depression.  Hendricks Limes, MSN, APRN, FNP-C [facility]  Subjective:    Patient ID: Regina Richardson, female    DOB: 08/12/1960, 60 y.o.   MRN: [id number]  Patient Care Team: Conyer Brooklyn, FNP as PCP - General (Family Medicine) Eloise Harman, DO as  Consulting Physician (Internal Medicine)   Chief Complaint:  Chief Complaint  Patient presents with   Hypertension   Depression    6 week follow up of chronic medical conditions . Patient states that her depression is worse.    Back Pain    Has not gotten better with muscle relaxer.     HPI: Regina Richardson is a 60 y.o. female presenting on 06/08/2021 for Hypertension, Depression (6 week follow up of chronic medical conditions . Patient states that her depression is worse. ), and Back Pain (Has not gotten better with muscle relaxer. )  Depression/Anxiety: patient has been back on Celexa and Wellbutrin. She does not feel controlled at all. She doesn't not feel they have been helpful at all. In the past she has also failed treatment with Cymbalta and Effexor.   Depression screen [facility] 2/9 06/08/2021 12/21/2020 03/08/2020  Decreased Interest 3 3 3   Down, Depressed, Hopeless 3 3 3   PHQ - 2 Score 6 6 6   Altered sleeping 3 3 3   Tired, decreased energy 3 3 2   Change in appetite 3 2 0  Feeling bad or failure about yourself  3 3 2   Trouble concentrating 3 3 2   Moving slowly or fidgety/restless 3 1 1   Suicidal thoughts 3 0 0  PHQ-9 Score 27 21 16   Difficult doing work/chores Extremely dIfficult Very difficult Somewhat difficult   GAD 7 : Generalized Anxiety Score 06/08/2021 12/21/2020 03/08/2020  Nervous, Anxious, on Edge 3 3 2   Control/stop worrying 3 3 2   Worry too much - different things 3 3 2  Trouble relaxing 3 3 2   Restless 0 3 1  Easily annoyed or irritable 3 3 2   Afraid - awful might happen 3 3 1   Total GAD 7 Score 18 21 12   Anxiety Difficulty Extremely difficult Somewhat difficult -    Back pain: Her back pain is constant and has been present for years. She has scoliosis and DDD. States the pain is made worse by prolonged sitting or standing. She reports in the past she has had injections in her spine that only have her an hour of relief. She does not find muscle relaxers helpful.  States she was told in the past she needed surgery, but does not wish to do so. However, she reports today that the pain has been worse for the past three weeks after she got her Christmas decorations out.   Palpitations/SOB: patient was previously referred to cardiology. She did not schedule this appointment. She is really struggling with depression currently and not wanting to take care of everything.   Apnea: she did schedule an appointment with pulmonology to discuss a sleep study. This appointment will be on 07/12/2021.  Hyperuricemia: patient has been on allopurinol since 2019 at which time this was prescribed by her orthopedic.  New complaints: None   Social history:  Relevant past medical, surgical, family and social history reviewed and updated as indicated. Interim medical history since our last visit reviewed.  Allergies and medications reviewed and updated.  DATA REVIEWED: CHART IN EPIC  ROS: Negative unless specifically indicated above in HPI.    Current Outpatient Medications:    allopurinol (ZYLOPRIM) 300 MG tablet, Take 1 tablet (300 mg total) by mouth daily., Disp: 90 tablet, Rfl: 1   buPROPion (WELLBUTRIN SR) 150 MG 12 hr tablet, TAKE 1 TABLET BY MOUTH TWICE A DAY, Disp: 180 tablet, Rfl: 0   citalopram (CELEXA) 20 MG tablet, TAKE 1 TABLET BY MOUTH EVERY DAY, Disp: 90 tablet, Rfl: 0   diclofenac (VOLTAREN) 75 MG EC tablet, Take 1 tablet (75 mg total) by mouth 2 (two) times daily as needed., Disp: 60 tablet, Rfl: 2   GARLIC PO, Take 1 capsule by mouth daily., Disp: , Rfl:    methocarbamol (ROBAXIN) 500 MG tablet, Take 1 tablet (500 mg total) by mouth every 8 (eight) hours as needed for muscle spasms., Disp: 60 tablet, Rfl: 2   metoprolol succinate (TOPROL-XL) 50 MG 24 hr tablet, Take 1 tablet (50 mg total) by mouth daily., Disp: 90 tablet, Rfl: 1   pantoprazole (PROTONIX) 40 MG tablet, Take 1 tablet (40 mg total) by mouth daily before breakfast., Disp: 90 tablet, Rfl:  1   traZODone (DESYREL) 100 MG tablet, Take 1 tablet (100 mg total) by mouth at bedtime., Disp: 90 tablet, Rfl: 1   hydrOXYzine (ATARAX/VISTARIL) 25 MG tablet, Take 25 mg by mouth every 8 (eight) hours as needed for anxiety. (Patient not taking: Reported on 06/08/2021), Disp: , Rfl:    No Known Allergies Past Medical History:  Diagnosis Date   Alcoholism ([place])    Anxiety    Arrhythmia    heart   Arthritis    Cancer ([place])    cervical cancer in the 80s   Cirrhosis ([place]) 69/6295   alcoholic;    Depression    Dysrhythmia    GERD (gastroesophageal reflux disease)    Hypertension    Osteoporosis    Scoliosis    Urinary incontinence     Past Surgical History:  Procedure Laterality Date   ABDOMINAL  HYSTERECTOMY     still has ovaries   BIOPSY  10/11/2020   Procedure: BIOPSY;  Surgeon: Eloise Harman, DO;  [facility];  Service: Endoscopy;;   BIOPSY  12/12/2020   Procedure: BIOPSY;  Surgeon: Irving Copas., MD;  Location: [facility];  Service: Gastroenterology;;   CERVICAL BIOPSY  W/ LOOP ELECTRODE EXCISION     CESAREAN SECTION     COLONOSCOPY  08/2017   Dr. Fuller Plan;  prominent and moderately lipomatous ileocecal valve, 9 mm sessile polyp in hepatic flexure, 8 mm sessile polyp in the descending colon, otherwise normal exam.  Pathology with sessile serrated polyp and hyperplastic polyp.  Recommended repeat colonoscopy in 5 years.   COLONOSCOPY N/A 06/15/2020   Procedure: COLONOSCOPY;  Surgeon: Rogene Houston, MD; 2 polyps resected and retrieved, 1 small polyp in the distal sigmoid biopsy, external hemorrhoids.  Pathology with 1 tubular adenoma and 1 hyperplastic polyp.    COLPOSCOPY     EGD with mucosal resection  03/16/2021   [facility]: 2 large plaques in the proximal-mid third of the esophagus s/p endoscopic submucosal resection and retrieval, placement of 4 MR conditional clips.  Normal stomach, normal examined duodenum.  Pathology revealed squamous cell carcinoma  in situ focally extending to the peripheral inked margin.  No invasive carcinoma. Recommended repeat EGD in 3 weeks.   ESOPHAGEAL DILATION N/A 06/15/2020   Procedure: ESOPHAGEAL DILATION;  Surgeon: Rogene Houston, MD;  [facility];  Service: Endoscopy;  Laterality: N/A;   ESOPHAGOGASTRODUODENOSCOPY  08/2017   Dr. Fuller Plan; LA grade B esophagitis without bleeding s/p biopsied, diffuse moderate inflammation and granularity in the entire examined stomach s/p biopsied, normal examined duodenum s/p biopsy. Gastric biopsy with chronic inactive gastritis without H. pylori, esophageal biopsy with mildly inflamed squamous mucosa, duodenal biopsy benign.   ESOPHAGOGASTRODUODENOSCOPY N/A 06/15/2020   Procedure: ESOPHAGOGASTRODUODENOSCOPY (EGD);  Surgeon: Rogene Houston, MD;  Proximal esophageal web, focal esophagitis at proximal esophagus s/p biopsy, benign-appearing esophageal stenosis s/p dilated, portal hypertensive gastropathy.  Esophageal biopsy with moderate squamous dysplasia.     ESOPHAGOGASTRODUODENOSCOPY (EGD) WITH PROPOFOL N/A 10/11/2020   Surgeon: Eloise Harman, DO; Eroded, inflamed mucosa in the esophagus. Biopsied. Portal hypertensive gastropathy.  Recommended PPI twice daily.  Pathology with moderate to severe squamous dysplasia.   ESOPHAGOGASTRODUODENOSCOPY (EGD) WITH PROPOFOL N/A 12/12/2020   Surgeon: Irving Copas., MD; Dionisio David, granular, mucosa in the esophagus at 23-26 cm.  Lesion 3 cm in length and encompassed 30-40% of esophageal wall s/p quick biopsy.  Erythematous mucosa in the stomach biopsied, portal hypertensive gastropathy.  Gastric biopsy with mild chronic inflammation, negative for H. pylori.  Esophageal biopsy with moderate to severe squamous dysplasia.   FINGER SURGERY     HEMORRHOID SURGERY     left collar bone surgery     had fx, pin placed and then complitcations caused them to remove most of the bone   left forearm surgery     from left elbow down    UPPER ESOPHAGEAL ENDOSCOPIC ULTRASOUND (EUS) N/A 12/12/2020   Surgeon: Irving Copas., MD;without esophageal wall thickening, 1 lymph node in the middle paraesophageal mediastinum that was suggestive of benign inflammatory changes, pancreatic lobularity in pancreatic body, queried possibility of chronic pancreatitis though complete EUS not performed and no history of pancreatitis.    Social History   Socioeconomic History   Marital status: Married    Spouse name: Not on file   Number of children: 1   Years of  education: Not on file   Highest education level: Not on file  Occupational History   Occupation: retired  Tobacco Use   Smoking status: Every Day    Packs/day: 1.50    Years: 40.00    Pack years: 60.00    Types: Cigarettes   Smokeless tobacco: Never  Vaping Use   Vaping Use: Never used  Substance and Sexual Activity   Alcohol use: Yes    Alcohol/week: 12.0 standard drinks    Types: 12 Cans of beer per week    Comment: 12 cans of beer daily   Drug use: No    Comment: Tried cocaine and marijuana in the 60s.    Sexual activity: Not Currently    Birth control/protection: None  Other Topics Concern   Not on file  Social History Narrative   Not on file   Social Determinants of Health   Financial Resource Strain: Not on file  Food Insecurity: Not on file  Transportation Needs: Not on file  Physical Activity: Not on file  Stress: Not on file  Social Connections: Not on file  Intimate Partner Violence: Not on file        Objective:    BP 126/69   Pulse 62   Temp 97.9 F (36.6 C) (Temporal)   Ht 5' 1"  (1.549 m)   Wt 165 lb 12.8 oz (75.2 kg)   SpO2 98%   BMI 31.33 kg/m   Wt Readings from Last 3 Encounters:  06/08/21 165 lb 12.8 oz (75.2 kg)  05/02/21 163 lb 9.6 oz (74.2 kg)  12/12/20 160 lb (72.6 kg)    Physical Exam Vitals reviewed.  Constitutional:      General: She is not in acute distress.    Appearance: Normal appearance. She is obese.  She is not ill-appearing, toxic-appearing or diaphoretic.  HENT:     Head: Normocephalic and atraumatic.  Eyes:     General: No scleral icterus.       Right eye: No discharge.        Left eye: No discharge.     Conjunctiva/sclera: Conjunctivae normal.  Cardiovascular:     Rate and Rhythm: Normal rate and regular rhythm.     Heart sounds: Normal heart sounds. No murmur heard.   No friction rub. No gallop.  Pulmonary:     Effort: Pulmonary effort is normal. No respiratory distress.     Breath sounds: Normal breath sounds. No stridor. No wheezing, rhonchi or rales.  Musculoskeletal:        General: Normal range of motion.     Cervical back: Normal range of motion.  Skin:    General: Skin is warm and dry.     Capillary Refill: Capillary refill takes less than 2 seconds.  Neurological:     General: No focal deficit present.     Mental Status: She is alert and oriented to person, place, and time. Mental status is at baseline.  Psychiatric:        Mood and Affect: Mood normal. Affect is tearful.        Behavior: Behavior normal.        Thought Content: Thought content normal.        Judgment: Judgment normal.    Lab Results  Component Value Date   TSH 0.791 05/02/2021   Lab Results  Component Value Date   WBC 10.7 05/02/2021   HGB 12.9 05/02/2021   HCT 36.0 05/02/2021   MCV 89 05/02/2021   PLT 346  05/02/2021   Lab Results  Component Value Date   NA 129 (L) 05/02/2021   K 4.0 05/02/2021   CO2 20 05/02/2021   GLUCOSE 116 (H) 05/02/2021   BUN 4 (L) 05/02/2021   CREATININE 0.56 (L) 05/02/2021   BILITOT 0.4 05/02/2021   ALKPHOS 163 (H) 05/02/2021   AST 75 (H) 05/02/2021   ALT 30 05/02/2021   PROT 7.8 05/02/2021   ALBUMIN 4.3 05/02/2021   CALCIUM 9.2 05/02/2021   ANIONGAP 6 06/15/2020   EGFR 104 05/02/2021   GFR 100.04 12/01/2020   Lab Results  Component Value Date   CHOL 226 (H) 05/02/2021   Lab Results  Component Value Date   HDL 69 05/02/2021   Lab Results   Component Value Date   LDLCALC 137 (H) 05/02/2021   Lab Results  Component Value Date   TRIG 113 05/02/2021   Lab Results  Component Value Date   CHOLHDL 3.3 05/02/2021   No results found for: HGBA1C

## 2021-06-09 MED ORDER — PREDNISONE 10 MG (21) PO TBPK: ORAL_TABLET | ORAL | 0 refills | Status: DC

## 2021-06-11 ENCOUNTER — Encounter: Payer: Self-pay | Admitting: Family Medicine

## 2021-06-11 DIAGNOSIS — E790 Hyperuricemia without signs of inflammatory arthritis and tophaceous disease: Secondary | ICD-10-CM | POA: Insufficient documentation

## 2021-06-11 DIAGNOSIS — R002 Palpitations: Secondary | ICD-10-CM | POA: Insufficient documentation

## 2021-06-11 DIAGNOSIS — R0681 Apnea, not elsewhere classified: Secondary | ICD-10-CM

## 2021-06-11 DIAGNOSIS — R0602 Shortness of breath: Secondary | ICD-10-CM | POA: Insufficient documentation

## 2021-06-11 HISTORY — DX: Apnea, not elsewhere classified: R06.81

## 2021-06-21 ENCOUNTER — Other Ambulatory Visit: Payer: Self-pay

## 2021-06-21 ENCOUNTER — Telehealth: Payer: Self-pay | Admitting: Family Medicine

## 2021-06-21 ENCOUNTER — Ambulatory Visit
Admission: RE | Admit: 2021-06-21 | Discharge: 2021-06-21 | Disposition: A | Discharge status: Home / Self Care | Payer: Medicare HMO | Source: Ambulatory Visit | Attending: Family Medicine | Admitting: Family Medicine

## 2021-06-21 DIAGNOSIS — Z1231 Encounter for screening mammogram for malignant neoplasm of breast: Secondary | ICD-10-CM

## 2021-06-21 DIAGNOSIS — F411 Generalized anxiety disorder: Secondary | ICD-10-CM

## 2021-06-21 DIAGNOSIS — F332 Major depressive disorder, recurrent severe without psychotic features: Secondary | ICD-10-CM

## 2021-06-21 MED ORDER — DESVENLAFAXINE SUCCINATE ER 25 MG PO TB24: 25.0000 mg | ORAL_TABLET | Freq: Every day | ORAL | 2 refills | Status: DC

## 2021-06-21 NOTE — Telephone Encounter (Signed)
Please let patient know I received her Gene Sight test results. I would like her to try Pristiq. She can stop Celexa and start the new medication.

## 2021-06-22 NOTE — Telephone Encounter (Signed)
Lmtcb.

## 2021-06-26 NOTE — Telephone Encounter (Signed)
Pt aware.

## 2021-07-06 ENCOUNTER — Telehealth: Payer: Self-pay | Admitting: Family Medicine

## 2021-07-12 ENCOUNTER — Encounter: Payer: Self-pay | Admitting: Pulmonary Disease

## 2021-07-12 ENCOUNTER — Ambulatory Visit: Payer: Medicare HMO | Admitting: Pulmonary Disease

## 2021-07-12 ENCOUNTER — Other Ambulatory Visit: Payer: Self-pay

## 2021-07-12 VITALS — BP 134/88 | HR 65 | Temp 98.0°F | Ht 60.0 in | Wt 166.0 lb

## 2021-07-12 DIAGNOSIS — F5105 Insomnia due to other mental disorder: Secondary | ICD-10-CM

## 2021-07-12 DIAGNOSIS — F10982 Alcohol use, unspecified with alcohol-induced sleep disorder: Secondary | ICD-10-CM

## 2021-07-12 DIAGNOSIS — F418 Other specified anxiety disorders: Secondary | ICD-10-CM | POA: Diagnosis not present

## 2021-07-12 DIAGNOSIS — R0683 Snoring: Secondary | ICD-10-CM

## 2021-07-12 DIAGNOSIS — Z720 Tobacco use: Secondary | ICD-10-CM

## 2021-07-12 NOTE — Progress Notes (Signed)
[facility]  Chief Complaint  Patient presents with   Consult    Has trouble sleeping. Ref by PCP for witnessed apneas.     Past Surgical History:  She  has a past surgical history that includes Abdominal hysterectomy; left collar bone surgery; left forearm surgery; Colonoscopy (08/2017); Cesarean section; Colposcopy; Cervical biopsy w/ loop electrode excision; Hemorrhoid surgery; Esophagogastroduodenoscopy (08/2017); Finger surgery; Esophagogastroduodenoscopy (N/A, 06/15/2020); Esophageal dilation (N/A, 06/15/2020); Colonoscopy (N/A, 06/15/2020); Esophagogastroduodenoscopy (egd) with propofol (N/A, 10/11/2020); biopsy (10/11/2020); Upper esophageal endoscopic ultrasound (eus) (N/A, 12/12/2020); biopsy (12/12/2020); Esophagogastroduodenoscopy (egd) with propofol (N/A, 12/12/2020); and EGD with mucosal resection (03/16/2021).  Past Medical History:  HTN, GERD, Depression, Anxiety, Back pain, Scoliosis, ETOH with cirrhosis, Cervical cancer, Osteoporosis  Constitutional:  BP 134/88 (BP Location: Left Arm, Patient Position: Sitting)    Pulse 65    Temp 98 F (36.7 C) (Temporal)    Ht 5' (1.524 m)    Wt 166 lb (75.3 kg)    SpO2 97% Comment: ra   BMI 32.42 kg/m   Brief Summary:  Regina Richardson is a 60 y.o. female smoker with snoring.       Subjective:   She had an EGD at [facility].  After procedure the providers told her that she had trouble with snoring and low oxygen level, and she should have assessment for sleep apnea.  She wakes up hearing herself snore sometimes.  She has to sleep in an adjustable bed.  She has trouble falling asleep, and has lots of stuff on her mind.  She drinks 3 cans of beer per day, and has used alcohol to try and sleep but not recently.  She used to drink 12 cans of beer per day.  She used to smoke a pack per day, and is now down to 4 cigarettes per day.  She goes to bed whenever she feels sleepy.  It can take an hour to  fall asleep.  She has been using trazodone, but this no longer helps.  She sleeps for about 3 hours then wakes up.  She sometimes can fall back to sleep.  She denies sleep walking, sleep talking, bruxism, or nightmares.  There is no history of restless legs.  She denies sleep hallucinations, sleep paralysis, or cataplexy.  The Epworth score is 6 out of 24.   Physical Exam:   Appearance - well kempt   ENMT - no sinus tenderness, no oral exudate, no LAN, Mallampati 3 airway, no stridor  Respiratory - equal breath sounds bilaterally, no wheezing or rales  CV - s1s2 regular rate and rhythm, no murmurs  Ext - no clubbing, no edema  Skin - no rashes  Psych - normal mood and affect   Sleep Tests:    Social History:  She  reports that she has been smoking cigarettes. She has a 60.00 pack-year smoking history. She has never used smokeless tobacco. She reports current alcohol use of about 12.0 standard drinks per week. She reports that she does not use drugs.  Family History:  Her family history includes Alcohol abuse in her father and mother; Cirrhosis in her brother; Diabetes in her cousin, maternal aunt, and sister; Heart disease in her father; Kidney disease in her maternal aunt; Liver disease in her brother, mother, and sister; Lung cancer in her father.    Discussion:  She has snoring, sleep disruption, apnea, and daytime sleepiness.  She has history of hypertension and depression.  I am concerned she  could have obstructive sleep apnea.  She also has symptoms of insomnia associated with depression.  She has chronic alcohol consumption which could be contributing to sleep disruption.  Assessment/Plan:   Snoring with excessive daytime sleepiness. - will need to arrange for a home sleep study  Tobacco abuse. - reviewed options to help with smoking cessation - she will continue to gradually quit on her own  Insomnia with depression. - explained how sleep apnea can sometimes  mimic symptoms of insomnia  Alcoholic cirrhosis. - explained how chronic alcohol use can contribute to sleep disruption and encouraged her to continue her efforts to stop drinking alcohol  Obesity. - discussed how weight can impact sleep and risk for sleep disordered breathing - discussed options to assist with weight loss: combination of diet modification, cardiovascular and strength training exercises  Cardiovascular risk. - had an extensive discussion regarding the adverse health consequences related to untreated sleep disordered breathing - specifically discussed the risks for hypertension, coronary artery disease, cardiac dysrhythmias, cerebrovascular disease, and diabetes - lifestyle modification discussed  Safe driving practices. - discussed how sleep disruption can increase risk of accidents, particularly when driving - safe driving practices were discussed  Therapies for obstructive sleep apnea. - if the sleep study shows significant sleep apnea, then various therapies for treatment were reviewed: CPAP, oral appliance, and surgical interventions  Time Spent Involved in Patient Care on Day of Examination:  48 minutes  Follow up:   Patient Instructions  Will arrange for home sleep study Will call to arrange for follow up after sleep study reviewed   Medication List:   Allergies as of 07/12/2021   No Known Allergies      Medication List        Accurate as of July 12, 2021  1:24 PM. If you have any questions, ask your nurse or doctor.          STOP taking these medications    GARLIC PO Stopped by: Chesley Mires, MD       TAKE these medications    allopurinol 300 MG tablet Commonly known as: ZYLOPRIM Take 1 tablet (300 mg total) by mouth daily.   buPROPion 150 MG 12 hr tablet Commonly known as: WELLBUTRIN SR TAKE 1 TABLET BY MOUTH TWICE A DAY   desvenlafaxine 25 MG 24 hr tablet Commonly known as: PRISTIQ Take 1 tablet (25 mg total) by mouth  daily.   diclofenac 75 MG EC tablet Commonly known as: VOLTAREN Take 1 tablet (75 mg total) by mouth 2 (two) times daily as needed.   methocarbamol 500 MG tablet Commonly known as: Robaxin Take 1 tablet (500 mg total) by mouth every 8 (eight) hours as needed for muscle spasms.   metoprolol succinate 50 MG 24 hr tablet Commonly known as: TOPROL-XL Take 1 tablet (50 mg total) by mouth daily.   pantoprazole 40 MG tablet Commonly known as: PROTONIX Take 1 tablet (40 mg total) by mouth daily before breakfast.   predniSONE 10 MG (21) Tbpk tablet Commonly known as: STERAPRED UNI-PAK 21 TAB As directed x 6 days   traZODone 100 MG tablet Commonly known as: DESYREL Take 1 tablet (100 mg total) by mouth at bedtime.        Signature:  Chesley Mires, MD [place] Pager - [phone] 07/12/2021, 1:24 PM

## 2021-07-12 NOTE — Patient Instructions (Signed)
Will arrange for home sleep study Will call to arrange for follow up after sleep study reviewed  

## 2021-07-19 ENCOUNTER — Other Ambulatory Visit: Payer: Self-pay | Admitting: Family Medicine

## 2021-07-19 DIAGNOSIS — F332 Major depressive disorder, recurrent severe without psychotic features: Secondary | ICD-10-CM

## 2021-07-19 DIAGNOSIS — F411 Generalized anxiety disorder: Secondary | ICD-10-CM

## 2021-07-24 ENCOUNTER — Encounter: Payer: Self-pay | Admitting: Family Medicine

## 2021-07-26 ENCOUNTER — Telehealth: Payer: Self-pay | Admitting: Family Medicine

## 2021-07-26 NOTE — Telephone Encounter (Signed)
Called pt to schedule AWV, unable to leave message

## 2021-08-07 ENCOUNTER — Other Ambulatory Visit: Payer: Self-pay | Admitting: Family Medicine

## 2021-08-07 DIAGNOSIS — M4126 Other idiopathic scoliosis, lumbar region: Secondary | ICD-10-CM

## 2021-08-07 DIAGNOSIS — G8929 Other chronic pain: Secondary | ICD-10-CM

## 2021-08-08 ENCOUNTER — Encounter: Payer: Self-pay | Admitting: Family Medicine

## 2021-08-08 ENCOUNTER — Ambulatory Visit (INDEPENDENT_AMBULATORY_CARE_PROVIDER_SITE_OTHER): Payer: Medicare HMO | Admitting: Family Medicine

## 2021-08-08 VITALS — BP 139/63 | HR 66 | Temp 97.8°F | Ht 61.0 in | Wt 168.2 lb

## 2021-08-08 DIAGNOSIS — M545 Low back pain, unspecified: Secondary | ICD-10-CM

## 2021-08-08 DIAGNOSIS — Z23 Encounter for immunization: Secondary | ICD-10-CM

## 2021-08-08 DIAGNOSIS — F411 Generalized anxiety disorder: Secondary | ICD-10-CM | POA: Diagnosis not present

## 2021-08-08 DIAGNOSIS — R002 Palpitations: Secondary | ICD-10-CM

## 2021-08-08 DIAGNOSIS — G8929 Other chronic pain: Secondary | ICD-10-CM

## 2021-08-08 DIAGNOSIS — I10 Essential (primary) hypertension: Secondary | ICD-10-CM | POA: Diagnosis not present

## 2021-08-08 DIAGNOSIS — R0602 Shortness of breath: Secondary | ICD-10-CM | POA: Diagnosis not present

## 2021-08-08 DIAGNOSIS — M4126 Other idiopathic scoliosis, lumbar region: Secondary | ICD-10-CM | POA: Diagnosis not present

## 2021-08-08 DIAGNOSIS — E871 Hypo-osmolality and hyponatremia: Secondary | ICD-10-CM | POA: Diagnosis not present

## 2021-08-08 DIAGNOSIS — F332 Major depressive disorder, recurrent severe without psychotic features: Secondary | ICD-10-CM

## 2021-08-08 MED ORDER — METHOCARBAMOL 500 MG PO TABS: 500.0000 mg | ORAL_TABLET | Freq: Three times a day (TID) | ORAL | 2 refills | Status: DC | PRN

## 2021-08-08 MED ORDER — BUPROPION HCL ER (SR) 150 MG PO TB12: 150.0000 mg | ORAL_TABLET | Freq: Two times a day (BID) | ORAL | 1 refills | Status: DC

## 2021-08-08 MED ORDER — METOPROLOL SUCCINATE ER 50 MG PO TB24: 50.0000 mg | ORAL_TABLET | Freq: Every day | ORAL | 1 refills | Status: DC

## 2021-08-08 MED ORDER — DESVENLAFAXINE SUCCINATE ER 50 MG PO TB24: 50.0000 mg | ORAL_TABLET | Freq: Every day | ORAL | 2 refills | Status: DC

## 2021-08-08 NOTE — Progress Notes (Signed)
Assessment & Plan:  1. Major depressive disorder, recurrent severe without psychotic features ([place]) Uncontrolled. Pristiq increased from 25 mg to 50 mg daily.  - buPROPion (WELLBUTRIN SR) 150 MG 12 hr tablet; Take 1 tablet (150 mg total) by mouth 2 (two) times daily.  Dispense: 180 tablet; Refill: 1 - desvenlafaxine (PRISTIQ) 50 MG 24 hr tablet; Take 1 tablet (50 mg total) by mouth daily.  Dispense: 30 tablet; Refill: 2  2. Generalized anxiety disorder Uncontrolled. Pristiq increased from 25 mg to 50 mg daily.  - desvenlafaxine (PRISTIQ) 50 MG 24 hr tablet; Take 1 tablet (50 mg total) by mouth daily.  Dispense: 30 tablet; Refill: 2  3. Hyponatremia - BMP8+EGFR  4. Palpitations - Ambulatory referral to Cardiology  5. Exertional shortness of breath - Ambulatory referral to Cardiology  6. Immunization due Shingrix and Prevnar 20 given in office today.    Return in about 6 weeks (around 09/19/2021) for Depression/Anxiety.  Hendricks Limes, MSN, APRN, FNP-C [facility]  Subjective:    Patient ID: Regina Richardson, female    DOB: 1961/05/21, 61 y.o.   MRN: [id number]  Patient Care Team: Deprey Brooklyn, FNP as PCP - General (Family Medicine) Eloise Harman, DO as Consulting Physician (Internal Medicine)   Chief Complaint:  Chief Complaint  Patient presents with   Anxiety   Depression    2 month follow up. Patient states it is no better.     HPI: Regina Richardson is a 61 y.o. female presenting on 08/08/2021 for Anxiety and Depression (2 month follow up. Patient states it is no better. )  Depression/Anxiety: patient was started on Pristiq after [facility] Sight testing was completed. She does not feel any better. In the past she has failed treatment with Cymbalta, Effexor, and Celexa.   Depression screen [facility] 2/9 08/08/2021 06/08/2021 12/21/2020  Decreased Interest _0 Down, Depressed, Hopeless _1 PHQ - 2 Score _2 Altered sleeping _3 Tired,  decreased energy _4 Change in appetite _5 Feeling bad or failure about yourself  _6 Trouble concentrating _7 Moving slowly or fidgety/restless _8 Suicidal thoughts 2 3 0  PHQ-9 Score _9 Difficult doing work/chores Very difficult Extremely dIfficult Very difficult   GAD 7 : Generalized Anxiety Score 08/08/2021 06/08/2021 12/21/2020 03/08/2020  Nervous, Anxious, on Edge _10 Control/stop worrying _11 Worry too much - different things _12 Trouble relaxing _13 Restless 3 0 3 1  Easily annoyed or irritable _14 Afraid - awful might happen _15 Total GAD 7 Score _16 Anxiety Difficulty Extremely difficult Extremely difficult Somewhat difficult -    New complaints: Patient reports she is ready to see cardiology now for palpitations and exertional shortness of breath. The referral was previously closed as they were unable to reach her.    Social history:  Relevant past medical, surgical, family and social history reviewed and updated as indicated. Interim medical history since our last visit reviewed.  Allergies and medications reviewed and updated.  DATA REVIEWED: CHART IN EPIC  ROS: Negative unless specifically indicated above in HPI.    Current Outpatient Medications:    allopurinol (ZYLOPRIM) 300 MG tablet, Take 1 tablet (300 mg total)  by mouth daily., Disp: 90 tablet, Rfl: 1   buPROPion (WELLBUTRIN SR) 150 MG 12 hr tablet, TAKE 1 TABLET BY MOUTH TWICE A DAY, Disp: 180 tablet, Rfl: 0   desvenlafaxine (PRISTIQ) 25 MG 24 hr tablet, TAKE 1 TABLET (25 MG TOTAL) BY MOUTH DAILY., Disp: 90 tablet, Rfl: 0   diclofenac (VOLTAREN) 75 MG EC tablet, TAKE 1 TABLET BY MOUTH 2 TIMES DAILY AS NEEDED., Disp: 60 tablet, Rfl: 0   methocarbamol (ROBAXIN) 500 MG tablet, Take 1 tablet (500 mg total) by mouth every 8 (eight) hours as needed for muscle spasms., Disp: 60 tablet, Rfl: 2   metoprolol succinate (TOPROL-XL) 50 MG 24 hr tablet, Take 1  tablet (50 mg total) by mouth daily., Disp: 90 tablet, Rfl: 1   pantoprazole (PROTONIX) 40 MG tablet, Take 1 tablet (40 mg total) by mouth daily before breakfast., Disp: 90 tablet, Rfl: 1   traZODone (DESYREL) 100 MG tablet, Take 1 tablet (100 mg total) by mouth at bedtime., Disp: 90 tablet, Rfl: 1   No Known Allergies Past Medical History:  Diagnosis Date   Alcoholism ([place])    Anxiety    Arrhythmia    heart   Arthritis    Cancer ([place])    cervical cancer in the 80s   Cirrhosis ([place]) 68/1157   alcoholic;    Depression    Dysrhythmia    GERD (gastroesophageal reflux disease)    Hypertension    Osteoporosis    Scoliosis    Urinary incontinence     Past Surgical History:  Procedure Laterality Date   BIOPSY  10/11/2020   Procedure: BIOPSY;  Surgeon: Eloise Harman, DO;  Location: [facility];  Service: Endoscopy;;   BIOPSY  12/12/2020   Procedure: BIOPSY;  Surgeon: Irving Copas., MD;  Location: [facility];  Service: Gastroenterology;;   CERVICAL BIOPSY  W/ LOOP ELECTRODE EXCISION     CESAREAN SECTION     COLONOSCOPY  08/2017   Dr. Fuller Plan;  prominent and moderately lipomatous ileocecal valve, 9 mm sessile polyp in hepatic flexure, 8 mm sessile polyp in the descending colon, otherwise normal exam.  Pathology with sessile serrated polyp and hyperplastic polyp.  Recommended repeat colonoscopy in 5 years.   COLONOSCOPY N/A 06/15/2020   Procedure: COLONOSCOPY;  Surgeon: Rogene Houston, MD; 2 polyps resected and retrieved, 1 small polyp in the distal sigmoid biopsy, external hemorrhoids.  Pathology with 1 tubular adenoma and 1 hyperplastic polyp.    COLPOSCOPY     EGD with mucosal resection  03/16/2021   [facility]: 2 large plaques in the proximal-mid third of the esophagus s/p endoscopic submucosal resection and retrieval, placement of 4 MR conditional clips.  Normal stomach, normal examined duodenum.  Pathology revealed squamous cell carcinoma in situ focally extending to the  peripheral inked margin.  No invasive carcinoma. Recommended repeat EGD in 3 weeks.   ESOPHAGEAL DILATION N/A 06/15/2020   Procedure: ESOPHAGEAL DILATION;  Surgeon: Rogene Houston, MD;  Location: [facility];  Service: Endoscopy;  Laterality: N/A;   ESOPHAGOGASTRODUODENOSCOPY  08/2017   Dr. Fuller Plan; LA grade B esophagitis without bleeding s/p biopsied, diffuse moderate inflammation and granularity in the entire examined stomach s/p biopsied, normal examined duodenum s/p biopsy. Gastric biopsy with chronic inactive gastritis without H. pylori, esophageal biopsy with mildly inflamed squamous mucosa, duodenal biopsy benign.   ESOPHAGOGASTRODUODENOSCOPY N/A 06/15/2020   Procedure: ESOPHAGOGASTRODUODENOSCOPY (EGD);  Surgeon: Rogene Houston, MD;  Proximal esophageal web, focal esophagitis at proximal esophagus s/p  biopsy, benign-appearing esophageal stenosis s/p dilated, portal hypertensive gastropathy.  Esophageal biopsy with moderate squamous dysplasia.     ESOPHAGOGASTRODUODENOSCOPY (EGD) WITH PROPOFOL N/A 10/11/2020   Surgeon: Eloise Harman, DO; Eroded, inflamed mucosa in the esophagus. Biopsied. Portal hypertensive gastropathy.  Recommended PPI twice daily.  Pathology with moderate to severe squamous dysplasia.   ESOPHAGOGASTRODUODENOSCOPY (EGD) WITH PROPOFOL N/A 12/12/2020   Surgeon: Irving Copas., MD; Dionisio David, granular, mucosa in the esophagus at 23-26 cm.  Lesion 3 cm in length and encompassed 30-40% of esophageal wall s/p quick biopsy.  Erythematous mucosa in the stomach biopsied, portal hypertensive gastropathy.  Gastric biopsy with mild chronic inflammation, negative for H. pylori.  Esophageal biopsy with moderate to severe squamous dysplasia.   FINGER SURGERY     HEMORRHOID SURGERY     left collar bone surgery     had fx, pin placed and then complitcations caused them to remove most of the bone   left forearm surgery     from left elbow down   TOTAL ABDOMINAL HYSTERECTOMY      still has ovaries   UPPER ESOPHAGEAL ENDOSCOPIC ULTRASOUND (EUS) N/A 12/12/2020   Surgeon: Irving Copas., MD;without esophageal wall thickening, 1 lymph node in the middle paraesophageal mediastinum that was suggestive of benign inflammatory changes, pancreatic lobularity in pancreatic body, queried possibility of chronic pancreatitis though complete EUS not performed and no history of pancreatitis.    Social History   Socioeconomic History   Marital status: Married    Spouse name: Not on file   Number of children: 1   Years of education: Not on file   Highest education level: Not on file  Occupational History   Occupation: retired  Tobacco Use   Smoking status: Every Day    Packs/day: 1.50    Years: 40.00    Pack years: 60.00    Types: Cigarettes   Smokeless tobacco: Never  Vaping Use   Vaping Use: Never used  Substance and Sexual Activity   Alcohol use: Yes    Alcohol/week: 12.0 standard drinks    Types: 12 Cans of beer per week    Comment: 12 cans of beer daily   Drug use: No    Comment: Tried cocaine and marijuana in the 60s.    Sexual activity: Not Currently    Birth control/protection: None  Other Topics Concern   Not on file  Social History Narrative   Not on file   Social Determinants of Health   Financial Resource Strain: Not on file  Food Insecurity: Not on file  Transportation Needs: Not on file  Physical Activity: Not on file  Stress: Not on file  Social Connections: Not on file  Intimate Partner Violence: Not on file        Objective:    BP 139/63    Pulse 66    Temp 97.8 F (36.6 C) (Temporal)    Ht _0  (1.549 m)    Wt 168 lb 3.2 oz (76.3 kg)    SpO2 97%    BMI 31.78 kg/m   Wt Readings from Last 3 Encounters:  08/08/21 168 lb 3.2 oz (76.3 kg)  07/12/21 166 lb (75.3 kg)  06/08/21 165 lb 12.8 oz (75.2 kg)    Physical Exam Vitals reviewed.  Constitutional:      General: She is not in acute distress.    Appearance: Normal  appearance. She is obese. She is not ill-appearing, toxic-appearing or diaphoretic.  HENT:  Head: Normocephalic and atraumatic.  Eyes:     General: No scleral icterus.       Right eye: No discharge.        Left eye: No discharge.     Conjunctiva/sclera: Conjunctivae normal.  Cardiovascular:     Rate and Rhythm: Normal rate and regular rhythm.     Heart sounds: Normal heart sounds. No murmur heard.   No friction rub. No gallop.  Pulmonary:     Effort: Pulmonary effort is normal. No respiratory distress.     Breath sounds: Normal breath sounds. No stridor. No wheezing, rhonchi or rales.  Musculoskeletal:        General: Normal range of motion.     Cervical back: Normal range of motion.  Skin:    General: Skin is warm and dry.     Capillary Refill: Capillary refill takes less than 2 seconds.  Neurological:     General: No focal deficit present.     Mental Status: She is alert and oriented to person, place, and time. Mental status is at baseline.  Psychiatric:        Mood and Affect: Mood and affect normal.        Behavior: Behavior normal.        Thought Content: Thought content normal.        Judgment: Judgment normal.    Lab Results  Component Value Date   TSH 0.791 05/02/2021   Lab Results  Component Value Date   WBC 10.7 05/02/2021   HGB 12.9 05/02/2021   HCT 36.0 05/02/2021   MCV 89 05/02/2021   PLT 346 05/02/2021   Lab Results  Component Value Date   NA 129 (L) 05/02/2021   K 4.0 05/02/2021   CO2 20 05/02/2021   GLUCOSE 116 (H) 05/02/2021   BUN 4 (L) 05/02/2021   CREATININE 0.56 (L) 05/02/2021   BILITOT 0.4 05/02/2021   ALKPHOS 163 (H) 05/02/2021   AST 75 (H) 05/02/2021   ALT 30 05/02/2021   PROT 7.8 05/02/2021   ALBUMIN 4.3 05/02/2021   CALCIUM 9.2 05/02/2021   ANIONGAP 6 06/15/2020   EGFR 104 05/02/2021   GFR 100.04 12/01/2020   Lab Results  Component Value Date   CHOL 226 (H) 05/02/2021   Lab Results  Component Value Date   HDL 69  05/02/2021   Lab Results  Component Value Date   LDLCALC 137 (H) 05/02/2021   Lab Results  Component Value Date   TRIG 113 05/02/2021   Lab Results  Component Value Date   CHOLHDL 3.3 05/02/2021   No results found for: HGBA1C

## 2021-08-09 LAB — BMP8+EGFR
BUN/Creatinine Ratio: 10 — ABNORMAL LOW (ref 12–28)
BUN: 5 mg/dL — ABNORMAL LOW (ref 8–27)
CO2: 23 mmol/L (ref 20–29)
Calcium: 9.5 mg/dL (ref 8.7–10.3)
Chloride: 92 mmol/L — ABNORMAL LOW (ref 96–106)
Creatinine, Ser: 0.5 mg/dL — ABNORMAL LOW (ref 0.57–1.00)
Glucose: 92 mg/dL (ref 70–99)
Potassium: 4.6 mmol/L (ref 3.5–5.2)
Sodium: 130 mmol/L — ABNORMAL LOW (ref 134–144)
eGFR: 107 mL/min/{1.73_m2} (ref >59)

## 2021-09-07 ENCOUNTER — Ambulatory Visit (INDEPENDENT_AMBULATORY_CARE_PROVIDER_SITE_OTHER): Payer: Medicare HMO | Admitting: Family Medicine

## 2021-09-07 ENCOUNTER — Encounter: Payer: Self-pay | Admitting: Family Medicine

## 2021-09-07 VITALS — BP 124/65 | HR 72 | Temp 97.6°F | Ht 61.0 in | Wt 163.6 lb

## 2021-09-07 DIAGNOSIS — L509 Urticaria, unspecified: Secondary | ICD-10-CM | POA: Diagnosis not present

## 2021-09-07 NOTE — Patient Instructions (Signed)
Benadryl for hives.

## 2021-09-07 NOTE — Progress Notes (Signed)
Assessment & Plan:  1. Urticaria Education provided on hives. Encouraged to take Benadryl if this happens again.    Follow up plan: Return if symptoms worsen or fail to improve.  Hendricks Limes, MSN, APRN, FNP-C [facility]  Subjective:   Patient ID: Regina Richardson, female    DOB: 1961-06-27, 61 y.o.   MRN: [id number]  HPI: Regina Richardson is a 61 y.o. female presenting on 09/07/2021 for Urticaria  Patient reports she was broke out in hives yesterday, which have resolved today. The rash was on her arms, side, and back. No new soaps, food, detergent, lotions, etc.    ROS: Negative unless specifically indicated above in HPI.   Relevant past medical history reviewed and updated as indicated.   Allergies and medications reviewed and updated.   Current Outpatient Medications:    allopurinol (ZYLOPRIM) 300 MG tablet, Take 1 tablet (300 mg total) by mouth daily., Disp: 90 tablet, Rfl: 1   buPROPion (WELLBUTRIN SR) 150 MG 12 hr tablet, Take 1 tablet (150 mg total) by mouth 2 (two) times daily., Disp: 180 tablet, Rfl: 1   desvenlafaxine (PRISTIQ) 50 MG 24 hr tablet, Take 1 tablet (50 mg total) by mouth daily., Disp: 30 tablet, Rfl: 2   diclofenac (VOLTAREN) 75 MG EC tablet, TAKE 1 TABLET BY MOUTH 2 TIMES DAILY AS NEEDED., Disp: 60 tablet, Rfl: 0   methocarbamol (ROBAXIN) 500 MG tablet, Take 1 tablet (500 mg total) by mouth every 8 (eight) hours as needed for muscle spasms., Disp: 60 tablet, Rfl: 2   metoprolol succinate (TOPROL-XL) 50 MG 24 hr tablet, Take 1 tablet (50 mg total) by mouth daily., Disp: 90 tablet, Rfl: 1   pantoprazole (PROTONIX) 40 MG tablet, Take 1 tablet (40 mg total) by mouth daily before breakfast., Disp: 90 tablet, Rfl: 1   traZODone (DESYREL) 100 MG tablet, Take 1 tablet (100 mg total) by mouth at bedtime., Disp: 90 tablet, Rfl: 1  No Known Allergies  Objective:   BP 124/65    Pulse 72    Temp 97.6 F (36.4 C)    Ht 5\' 1"  (1.549 m)    Wt 163  lb 9.6 oz (74.2 kg)    SpO2 98%    BMI 30.91 kg/m    Physical Exam Vitals reviewed.  Constitutional:      General: She is not in acute distress.    Appearance: Normal appearance. She is not ill-appearing, toxic-appearing or diaphoretic.  HENT:     Head: Normocephalic and atraumatic.  Eyes:     General: No scleral icterus.       Right eye: No discharge.        Left eye: No discharge.     Conjunctiva/sclera: Conjunctivae normal.  Cardiovascular:     Rate and Rhythm: Normal rate.  Pulmonary:     Effort: Pulmonary effort is normal. No respiratory distress.  Musculoskeletal:        General: Normal range of motion.     Cervical back: Normal range of motion.  Skin:    General: Skin is warm and dry.     Capillary Refill: Capillary refill takes less than 2 seconds.  Neurological:     General: No focal deficit present.     Mental Status: She is alert and oriented to person, place, and time. Mental status is at baseline.  Psychiatric:        Mood and Affect: Mood normal.        Behavior: Behavior  normal.        Thought Content: Thought content normal.        Judgment: Judgment normal.

## 2021-09-11 ENCOUNTER — Other Ambulatory Visit: Payer: Self-pay | Admitting: Family Medicine

## 2021-09-11 DIAGNOSIS — G8929 Other chronic pain: Secondary | ICD-10-CM

## 2021-09-11 DIAGNOSIS — M4126 Other idiopathic scoliosis, lumbar region: Secondary | ICD-10-CM

## 2021-09-11 DIAGNOSIS — M545 Low back pain, unspecified: Secondary | ICD-10-CM

## 2021-09-12 ENCOUNTER — Other Ambulatory Visit: Payer: Self-pay | Admitting: Family Medicine

## 2021-09-12 DIAGNOSIS — F332 Major depressive disorder, recurrent severe without psychotic features: Secondary | ICD-10-CM

## 2021-09-12 DIAGNOSIS — F411 Generalized anxiety disorder: Secondary | ICD-10-CM

## 2021-09-19 ENCOUNTER — Encounter: Payer: Self-pay | Admitting: Family Medicine

## 2021-09-19 ENCOUNTER — Ambulatory Visit (INDEPENDENT_AMBULATORY_CARE_PROVIDER_SITE_OTHER): Payer: Medicare HMO | Admitting: Family Medicine

## 2021-09-19 VITALS — BP 123/71 | HR 82 | Temp 98.0°F | Ht 61.0 in | Wt 160.6 lb

## 2021-09-19 DIAGNOSIS — Z23 Encounter for immunization: Secondary | ICD-10-CM

## 2021-09-19 DIAGNOSIS — F411 Generalized anxiety disorder: Secondary | ICD-10-CM | POA: Diagnosis not present

## 2021-09-19 DIAGNOSIS — F332 Major depressive disorder, recurrent severe without psychotic features: Secondary | ICD-10-CM | POA: Diagnosis not present

## 2021-09-19 DIAGNOSIS — H524 Presbyopia: Secondary | ICD-10-CM | POA: Diagnosis not present

## 2021-09-19 MED ORDER — VIIBRYD STARTER PACK 10 & 20 MG PO KIT: PACK | ORAL | 0 refills | Status: DC

## 2021-09-19 MED ORDER — VILAZODONE HCL 20 MG PO TABS: 20.0000 mg | ORAL_TABLET | Freq: Every day | ORAL | 2 refills | Status: DC

## 2021-09-19 NOTE — Patient Instructions (Signed)
Decrease Pristiq to 25 mg every day x2 weeks, then every other day x2 weeks, then stop. Go ahead and start the new medication (Viibryd).

## 2021-09-19 NOTE — Progress Notes (Signed)
Assessment & Plan:  1-2. Major depressive disorder, recurrent severe without psychotic features (HCC)/Generalized anxiety disorder Uncontrolled.  Patient to wean off Pristiq by decreasing to 25 mg every day x2 weeks, then every other day x2 weeks, then stop.  She will go ahead and start Viibryd. - Vilazodone HCl (VIIBRYD STARTER PACK) 10 & 20 MG KIT; Take 10 mg by mouth daily for 7 days, THEN 20 mg daily for 23 days.  Dispense: 1 kit; Refill: 0 - Vilazodone HCl 20 MG TABS; Take 1 tablet (20 mg total) by mouth daily.  Dispense: 30 tablet; Refill: 2  3. Immunization due - Tdap vaccine greater than or equal to 7yo IM   Return in about 6 weeks (around 10/31/2021) for depression/anxiety.  Hendricks Limes, MSN, APRN, FNP-C [facility]  Subjective:    Patient ID: Regina Richardson, female    DOB: 07/26/1960, 61 y.o.   MRN: [id number]  Patient Care Team: Beckett Brooklyn, FNP as PCP - General (Family Medicine) Eloise Harman, DO as Consulting Physician (Internal Medicine)   Chief Complaint:  Chief Complaint  Patient presents with   Depression   Anxiety    6 week follow up. Patient states it is no better.     HPI: Regina Richardson is a 61 y.o. female presenting on 09/19/2021 for Depression and Anxiety (6 week follow up. Patient states it is no better. )  Depression/Anxiety: patient was started on Pristiq after [facility] testing was completed. She does not feel any better, even on the increased dosage. She reports her son is driving her crazy. In the past she has failed treatment with Cymbalta, Effexor, and Celexa.   Depression screen [facility] 2/9 09/19/2021 09/07/2021 09/07/2021  Decreased Interest 3 - 3  Down, Depressed, Hopeless 3 - 3  PHQ - 2 Score 6 - 6  Altered sleeping 3 - 3  Tired, decreased energy 3 - 3  Change in appetite 3 - 3  Feeling bad or failure about yourself  3 - 3  Trouble concentrating 3 - 3  Moving slowly or fidgety/restless 2 - 0  Suicidal thoughts  1 (No Data) 1  PHQ-9 Score 24 - 22  Difficult doing work/chores Extremely dIfficult - Extremely dIfficult  Some recent data might be hidden   GAD 7 : Generalized Anxiety Score 09/19/2021 09/07/2021 08/08/2021 06/08/2021  Nervous, Anxious, on Edge _0 Control/stop worrying _1 Worry too much - different things _2 Trouble relaxing _3 Restless _4 0  Easily annoyed or irritable _5 Afraid - awful might happen _6 Total GAD 7 Score _7 Anxiety Difficulty Extremely difficult Extremely difficult Extremely difficult Extremely difficult    New complaints: None   Social history:  Relevant past medical, surgical, family and social history reviewed and updated as indicated. Interim medical history since our last visit reviewed.  Allergies and medications reviewed and updated.  DATA REVIEWED: CHART IN EPIC  ROS: Negative unless specifically indicated above in HPI.    Current Outpatient Medications:    allopurinol (ZYLOPRIM) 300 MG tablet, Take 1 tablet (300 mg total) by mouth daily., Disp: 90 tablet, Rfl: 1   buPROPion (WELLBUTRIN SR) 150 MG 12 hr tablet, Take 1 tablet (150 mg total) by mouth 2 (two) times daily., Disp: 180 tablet, Rfl: 1   desvenlafaxine (PRISTIQ) 50 MG  24 hr tablet, Take 1 tablet (50 mg total) by mouth daily., Disp: 30 tablet, Rfl: 2   diclofenac (VOLTAREN) 75 MG EC tablet, TAKE 1 TABLET BY MOUTH TWICE A DAY AS NEEDED, Disp: 60 tablet, Rfl: 1   methocarbamol (ROBAXIN) 500 MG tablet, Take 1 tablet (500 mg total) by mouth every 8 (eight) hours as needed for muscle spasms., Disp: 60 tablet, Rfl: 2   metoprolol succinate (TOPROL-XL) 50 MG 24 hr tablet, Take 1 tablet (50 mg total) by mouth daily., Disp: 90 tablet, Rfl: 1   pantoprazole (PROTONIX) 40 MG tablet, Take 1 tablet (40 mg total) by mouth daily before breakfast., Disp: 90 tablet, Rfl: 1   traZODone (DESYREL) 100 MG tablet, Take 1 tablet (100 mg total) by mouth at bedtime.,  Disp: 90 tablet, Rfl: 1   No Known Allergies Past Medical History:  Diagnosis Date   Alcoholism ([place])    Anxiety    Arrhythmia    heart   Arthritis    Cancer ([place])    cervical cancer in the 80s   Cirrhosis ([place]) 04/2110   alcoholic;    Depression    Dysrhythmia    GERD (gastroesophageal reflux disease)    Hypertension    Osteoporosis    Scoliosis    Urinary incontinence     Past Surgical History:  Procedure Laterality Date   BIOPSY  10/11/2020   Procedure: BIOPSY;  Surgeon: Eloise Harman, DO;  [facility];  Service: Endoscopy;;   BIOPSY  12/12/2020   Procedure: BIOPSY;  Surgeon: Irving Copas., MD;  Location: [facility];  Service: Gastroenterology;;   CERVICAL BIOPSY  W/ LOOP ELECTRODE EXCISION     CESAREAN SECTION     COLONOSCOPY  08/2017   Dr. Fuller Plan;  prominent and moderately lipomatous ileocecal valve, 9 mm sessile polyp in hepatic flexure, 8 mm sessile polyp in the descending colon, otherwise normal exam.  Pathology with sessile serrated polyp and hyperplastic polyp.  Recommended repeat colonoscopy in 5 years.   COLONOSCOPY N/A 06/15/2020   Procedure: COLONOSCOPY;  Surgeon: Rogene Houston, MD; 2 polyps resected and retrieved, 1 small polyp in the distal sigmoid biopsy, external hemorrhoids.  Pathology with 1 tubular adenoma and 1 hyperplastic polyp.    COLPOSCOPY     EGD with mucosal resection  03/16/2021   [facility]: 2 large plaques in the proximal-mid third of the esophagus s/p endoscopic submucosal resection and retrieval, placement of 4 MR conditional clips.  Normal stomach, normal examined duodenum.  Pathology revealed squamous cell carcinoma in situ focally extending to the peripheral inked margin.  No invasive carcinoma. Recommended repeat EGD in 3 weeks.   ESOPHAGEAL DILATION N/A 06/15/2020   Procedure: ESOPHAGEAL DILATION;  Surgeon: Rogene Houston, MD;  [facility];  Service: Endoscopy;  Laterality: N/A;    ESOPHAGOGASTRODUODENOSCOPY  08/2017   Dr. Fuller Plan; LA grade B esophagitis without bleeding s/p biopsied, diffuse moderate inflammation and granularity in the entire examined stomach s/p biopsied, normal examined duodenum s/p biopsy. Gastric biopsy with chronic inactive gastritis without H. pylori, esophageal biopsy with mildly inflamed squamous mucosa, duodenal biopsy benign.   ESOPHAGOGASTRODUODENOSCOPY N/A 06/15/2020   Procedure: ESOPHAGOGASTRODUODENOSCOPY (EGD);  Surgeon: Rogene Houston, MD;  Proximal esophageal web, focal esophagitis at proximal esophagus s/p biopsy, benign-appearing esophageal stenosis s/p dilated, portal hypertensive gastropathy.  Esophageal biopsy with moderate squamous dysplasia.     ESOPHAGOGASTRODUODENOSCOPY (EGD) WITH PROPOFOL N/A 10/11/2020   Surgeon: Eloise Harman, DO; Eroded, inflamed mucosa in  the esophagus. Biopsied. Portal hypertensive gastropathy.  Recommended PPI twice daily.  Pathology with moderate to severe squamous dysplasia.   ESOPHAGOGASTRODUODENOSCOPY (EGD) WITH PROPOFOL N/A 12/12/2020   Surgeon: Irving Copas., MD; Dionisio David, granular, mucosa in the esophagus at 23-26 cm.  Lesion 3 cm in length and encompassed 30-40% of esophageal wall s/p quick biopsy.  Erythematous mucosa in the stomach biopsied, portal hypertensive gastropathy.  Gastric biopsy with mild chronic inflammation, negative for H. pylori.  Esophageal biopsy with moderate to severe squamous dysplasia.   FINGER SURGERY     HEMORRHOID SURGERY     left collar bone surgery     had fx, pin placed and then complitcations caused them to remove most of the bone   left forearm surgery     from left elbow down   TOTAL ABDOMINAL HYSTERECTOMY     still has ovaries   UPPER ESOPHAGEAL ENDOSCOPIC ULTRASOUND (EUS) N/A 12/12/2020   Surgeon: Irving Copas., MD;without esophageal wall thickening, 1 lymph node in the middle paraesophageal mediastinum that was suggestive of benign inflammatory  changes, pancreatic lobularity in pancreatic body, queried possibility of chronic pancreatitis though complete EUS not performed and no history of pancreatitis.    Social History   Socioeconomic History   Marital status: Married    Spouse name: Not on file   Number of children: 1   Years of education: Not on file   Highest education level: Not on file  Occupational History   Occupation: retired  Tobacco Use   Smoking status: Every Day    Packs/day: 1.50    Years: 40.00    Pack years: 60.00    Types: Cigarettes   Smokeless tobacco: Never  Vaping Use   Vaping Use: Never used  Substance and Sexual Activity   Alcohol use: Yes    Alcohol/week: 12.0 standard drinks    Types: 12 Cans of beer per week    Comment: 12 cans of beer daily   Drug use: No    Comment: Tried cocaine and marijuana in the 60s.    Sexual activity: Not Currently    Birth control/protection: None  Other Topics Concern   Not on file  Social History Narrative   Not on file   Social Determinants of Health   Financial Resource Strain: Not on file  Food Insecurity: Not on file  Transportation Needs: Not on file  Physical Activity: Not on file  Stress: Not on file  Social Connections: Not on file  Intimate Partner Violence: Not on file        Objective:    BP 123/71    Pulse 82    Temp 98 F (36.7 C) (Temporal)    Ht 5' 1" (1.549 m)    Wt 160 lb 9.6 oz (72.8 kg)    SpO2 96%    BMI 30.35 kg/m   Wt Readings from Last 3 Encounters:  09/19/21 160 lb 9.6 oz (72.8 kg)  09/07/21 163 lb 9.6 oz (74.2 kg)  08/08/21 168 lb 3.2 oz (76.3 kg)    Physical Exam Vitals reviewed.  Constitutional:      General: She is not in acute distress.    Appearance: Normal appearance. She is obese. She is not ill-appearing, toxic-appearing or diaphoretic.  HENT:     Head: Normocephalic and atraumatic.  Eyes:     General: No scleral icterus.       Right eye: No discharge.        Left eye: No discharge.  Conjunctiva/sclera: Conjunctivae normal.  Cardiovascular:     Rate and Rhythm: Normal rate and regular rhythm.     Heart sounds: Normal heart sounds. No murmur heard.   No friction rub. No gallop.  Pulmonary:     Effort: Pulmonary effort is normal. No respiratory distress.     Breath sounds: Normal breath sounds. No stridor. No wheezing, rhonchi or rales.  Musculoskeletal:        General: Normal range of motion.     Cervical back: Normal range of motion.  Skin:    General: Skin is warm and dry.     Capillary Refill: Capillary refill takes less than 2 seconds.  Neurological:     General: No focal deficit present.     Mental Status: She is alert and oriented to person, place, and time. Mental status is at baseline.  Psychiatric:        Mood and Affect: Mood and affect normal.        Behavior: Behavior normal.        Thought Content: Thought content normal.        Judgment: Judgment normal.    Lab Results  Component Value Date   TSH 0.791 05/02/2021   Lab Results  Component Value Date   WBC 10.7 05/02/2021   HGB 12.9 05/02/2021   HCT 36.0 05/02/2021   MCV 89 05/02/2021   PLT 346 05/02/2021   Lab Results  Component Value Date   NA 130 (L) 08/08/2021   K 4.6 08/08/2021   CO2 23 08/08/2021   GLUCOSE 92 08/08/2021   BUN 5 (L) 08/08/2021   CREATININE 0.50 (L) 08/08/2021   BILITOT 0.4 05/02/2021   ALKPHOS 163 (H) 05/02/2021   AST 75 (H) 05/02/2021   ALT 30 05/02/2021   PROT 7.8 05/02/2021   ALBUMIN 4.3 05/02/2021   CALCIUM 9.5 08/08/2021   ANIONGAP 6 06/15/2020   EGFR 107 08/08/2021   GFR 100.04 12/01/2020   Lab Results  Component Value Date   CHOL 226 (H) 05/02/2021   Lab Results  Component Value Date   HDL 69 05/02/2021   Lab Results  Component Value Date   LDLCALC 137 (H) 05/02/2021   Lab Results  Component Value Date   TRIG 113 05/02/2021   Lab Results  Component Value Date   CHOLHDL 3.3 05/02/2021   No results found for: HGBA1C

## 2021-09-20 DIAGNOSIS — H5213 Myopia, bilateral: Secondary | ICD-10-CM | POA: Diagnosis not present

## 2021-09-22 ENCOUNTER — Telehealth: Payer: Self-pay | Admitting: Family Medicine

## 2021-09-22 NOTE — Telephone Encounter (Signed)
(  Key: [id number]) ? ?This request has been approved. ? ?Please note any additional information provided by [facility] at the bottom of your screen. ?

## 2021-10-08 ENCOUNTER — Other Ambulatory Visit: Payer: Self-pay | Admitting: Family Medicine

## 2021-10-08 DIAGNOSIS — F332 Major depressive disorder, recurrent severe without psychotic features: Secondary | ICD-10-CM

## 2021-10-08 DIAGNOSIS — F411 Generalized anxiety disorder: Secondary | ICD-10-CM

## 2021-10-15 ENCOUNTER — Other Ambulatory Visit: Payer: Self-pay | Admitting: Family Medicine

## 2021-10-15 DIAGNOSIS — F332 Major depressive disorder, recurrent severe without psychotic features: Secondary | ICD-10-CM

## 2021-10-15 DIAGNOSIS — F411 Generalized anxiety disorder: Secondary | ICD-10-CM

## 2021-11-08 ENCOUNTER — Ambulatory Visit: Payer: Medicare HMO | Admitting: Family Medicine

## 2021-11-11 ENCOUNTER — Other Ambulatory Visit: Payer: Self-pay | Admitting: Family Medicine

## 2021-11-11 DIAGNOSIS — M4126 Other idiopathic scoliosis, lumbar region: Secondary | ICD-10-CM

## 2021-11-11 DIAGNOSIS — G8929 Other chronic pain: Secondary | ICD-10-CM

## 2021-11-15 ENCOUNTER — Ambulatory Visit (INDEPENDENT_AMBULATORY_CARE_PROVIDER_SITE_OTHER): Payer: Medicare HMO | Admitting: Family Medicine

## 2021-11-15 ENCOUNTER — Encounter: Payer: Self-pay | Admitting: Family Medicine

## 2021-11-15 VITALS — BP 162/78 | HR 89 | Wt 163.0 lb

## 2021-11-15 DIAGNOSIS — Z23 Encounter for immunization: Secondary | ICD-10-CM

## 2021-11-15 DIAGNOSIS — K219 Gastro-esophageal reflux disease without esophagitis: Secondary | ICD-10-CM | POA: Diagnosis not present

## 2021-11-15 DIAGNOSIS — E790 Hyperuricemia without signs of inflammatory arthritis and tophaceous disease: Secondary | ICD-10-CM | POA: Diagnosis not present

## 2021-11-15 DIAGNOSIS — F5101 Primary insomnia: Secondary | ICD-10-CM | POA: Diagnosis not present

## 2021-11-15 DIAGNOSIS — M545 Low back pain, unspecified: Secondary | ICD-10-CM | POA: Diagnosis not present

## 2021-11-15 DIAGNOSIS — F411 Generalized anxiety disorder: Secondary | ICD-10-CM

## 2021-11-15 DIAGNOSIS — F332 Major depressive disorder, recurrent severe without psychotic features: Secondary | ICD-10-CM | POA: Diagnosis not present

## 2021-11-15 DIAGNOSIS — G8929 Other chronic pain: Secondary | ICD-10-CM | POA: Diagnosis not present

## 2021-11-15 DIAGNOSIS — M4126 Other idiopathic scoliosis, lumbar region: Secondary | ICD-10-CM | POA: Diagnosis not present

## 2021-11-15 MED ORDER — PANTOPRAZOLE SODIUM 40 MG PO TBEC: 40.0000 mg | DELAYED_RELEASE_TABLET | Freq: Every day | ORAL | 1 refills | Status: DC

## 2021-11-15 MED ORDER — VILAZODONE HCL 20 MG PO TABS: 20.0000 mg | ORAL_TABLET | Freq: Every day | ORAL | 1 refills | Status: DC

## 2021-11-15 MED ORDER — METHOCARBAMOL 500 MG PO TABS: 500.0000 mg | ORAL_TABLET | Freq: Three times a day (TID) | ORAL | 5 refills | Status: DC | PRN

## 2021-11-15 MED ORDER — TRAZODONE HCL 100 MG PO TABS: 200.0000 mg | ORAL_TABLET | Freq: Every day | ORAL | 1 refills | Status: DC

## 2021-11-15 MED ORDER — ALLOPURINOL 300 MG PO TABS: 300.0000 mg | ORAL_TABLET | Freq: Every day | ORAL | 1 refills | Status: DC

## 2021-11-15 NOTE — Progress Notes (Signed)
? ?Assessment & Plan:  ?1. Major depressive disorder, recurrent severe without psychotic features ([place]) ?Uncontrolled, but patient does not wish to make any changes. ?- Vilazodone HCl 20 MG TABS; Take 1 tablet (20 mg total) by mouth daily.  Dispense: 90 tablet; Refill: 1 ?- traZODone (DESYREL) 100 MG tablet; Take 2 tablets (200 mg total) by mouth at bedtime.  Dispense: 90 tablet; Refill: 1 ? ?2. Generalized anxiety disorder ?Uncontrolled, but patient does not wish to make any changes. ?- Vilazodone HCl 20 MG TABS; Take 1 tablet (20 mg total) by mouth daily.  Dispense: 90 tablet; Refill: 1 ?- traZODone (DESYREL) 100 MG tablet; Take 2 tablets (200 mg total) by mouth at bedtime.  Dispense: 90 tablet; Refill: 1 ? ?3. Primary insomnia ?Uncontrolled. Increasing Trazodone from 100 mg to 200 mg nightly.  ?- traZODone (DESYREL) 100 MG tablet; Take 2 tablets (200 mg total) by mouth at bedtime.  Dispense: 90 tablet; Refill: 1 ? ?4-5. Chronic midline low back pain without sciatica/Other idiopathic scoliosis, lumbar region ?Encouraged to take Robaxin more frequently. Continue diclofenac. Patient declined referral to pain management.  ?- methocarbamol (ROBAXIN) 500 MG tablet; Take 1 tablet (500 mg total) by mouth every 8 (eight) hours as needed for muscle spasms.  Dispense: 90 tablet; Refill: 5 ? ?6. Need for shingles vaccine ?- Varicella-zoster vaccine IM (Shingrix) ? ? ?Return in about 6 weeks (around 12/27/2021) for annual physical. ? ?Hendricks Limes, MSN, APRN, FNP-C ?[place] ? ?Subjective:  ? ? Patient ID: Regina Richardson, female    DOB: 1961-01-21, 61 y.o.   MRN: [id number] ? ?Patient Care Team: ?Castrillon Brooklyn, FNP as PCP - General (Family Medicine) ?Eloise Harman, DO as Consulting Physician (Internal Medicine)  ? ?Chief Complaint:  ?Chief Complaint  ?Patient presents with  ? Medical Management of Chronic Issues  ? Depression  ? ? ?HPI: ?Regina Richardson is a 61 y.o. female presenting on 11/15/2021  for Medical Management of Chronic Issues and Depression ? ?Depression/Anxiety: patient was started on Viibryd at our last visit. She reports today she cannot tell a difference with the medication, but would like to stay on it. She reports her son is driving her crazy. In the past she has failed treatment with Cymbalta, Effexor, Pristiq, and Celexa.  ? ? ?  11/15/2021  ?  4:03 PM 09/19/2021  ?  2:35 PM 09/07/2021  ? 10:42 AM  ?Depression screen PHQ 2/9  ?Decreased Interest '3 3 3  '$ ?Down, Depressed, Hopeless '3 3 3  '$ ?PHQ - 2 Score 6 6   ?Altered sleeping '3 3 3  '$ ?Tired, decreased energy '3 3 3  '$ ?Change in appetite '3 3 3  '$ ?Feeling bad or failure about yourself  '3 3 3  '$ ?Trouble concentrating '3 3 3  '$ ?Moving slowly or fidgety/restless 3 2 0  ?Suicidal thoughts 0 1   ?PHQ-9 Score 24 24   ?Difficult doing work/chores  Extremely dIfficult   ? ? ?  11/15/2021  ?  4:04 PM 09/19/2021  ?  2:35 PM 09/07/2021  ? 10:43 AM 08/08/2021  ? 11:58 AM  ?GAD 7 : Generalized Anxiety Score  ?Nervous, Anxious, on Edge '3 3 3 3  '$ ?Control/stop worrying '3 3 3 3  '$ ?Worry too much - different things '3 3 3 3  '$ ?Trouble relaxing '3 3 3 3  '$ ?Restless '3 1 2 3  '$ ?Easily annoyed or irritable '3 3 3 3  '$ ?Afraid - awful might happen '1 3 3 3  '$ ?  Total GAD 7 Score '19 19 20 21  '$ ?Anxiety Difficulty  Extremely difficult Extremely difficult Extremely difficult  ? ? ?New complaints: ?Back pain: Her back pain is constant and has been present for years. She has scoliosis and DDD. States the pain is made worse by prolonged sitting or standing. She reports in the past she has had injections in her spine that only have her an hour of relief. She finds Robaxin helpful. States she was told in the past she needed surgery, but does not wish to do so. She also does not wish to see pain management.  ? ?Patient does not sleep well and would like to try to increase her Trazodone. ? ? ?Social history: ? ?Relevant past medical, surgical, family and social history reviewed and updated as indicated.  Interim medical history since our last visit reviewed. ? ?Allergies and medications reviewed and updated. ? ?DATA REVIEWED: CHART IN EPIC ? ?ROS: Negative unless specifically indicated above in HPI.  ? ? ?Current Outpatient Medications:  ?  allopurinol (ZYLOPRIM) 300 MG tablet, Take 1 tablet (300 mg total) by mouth daily., Disp: 90 tablet, Rfl: 1 ?  buPROPion (WELLBUTRIN SR) 150 MG 12 hr tablet, Take 1 tablet (150 mg total) by mouth 2 (two) times daily., Disp: 180 tablet, Rfl: 1 ?  diclofenac (VOLTAREN) 75 MG EC tablet, TAKE 1 TABLET BY MOUTH TWICE A DAY AS NEEDED, Disp: 60 tablet, Rfl: 0 ?  methocarbamol (ROBAXIN) 500 MG tablet, Take 1 tablet (500 mg total) by mouth every 8 (eight) hours as needed for muscle spasms., Disp: 60 tablet, Rfl: 2 ?  metoprolol succinate (TOPROL-XL) 50 MG 24 hr tablet, Take 1 tablet (50 mg total) by mouth daily., Disp: 90 tablet, Rfl: 1 ?  traZODone (DESYREL) 100 MG tablet, Take 1 tablet (100 mg total) by mouth at bedtime., Disp: 90 tablet, Rfl: 1 ?  Vilazodone HCl 20 MG TABS, Take 1 tablet (20 mg total) by mouth daily., Disp: 30 tablet, Rfl: 2 ?  pantoprazole (PROTONIX) 40 MG tablet, Take 1 tablet (40 mg total) by mouth daily before breakfast., Disp: 90 tablet, Rfl: 1  ? ?No Known Allergies ?Past Medical History:  ?Diagnosis Date  ? Alcoholism ([place])   ? Anxiety   ? Arrhythmia   ? heart  ? Arthritis   ? Cancer [facility])   ? cervical cancer in the 80s  ? Cirrhosis ([place]) 04/2020  ? alcoholic;   ? Depression   ? Dysrhythmia   ? GERD (gastroesophageal reflux disease)   ? Hypertension   ? Osteoporosis   ? Scoliosis   ? Urinary incontinence   ?  ?Past Surgical History:  ?Procedure Laterality Date  ? BIOPSY  10/11/2020  ? Procedure: BIOPSY;  Surgeon: Eloise Harman, DO;  [facility];  Service: Endoscopy;;  ? BIOPSY  12/12/2020  ? Procedure: BIOPSY;  Surgeon: Irving Copas., MD;  Location: [facility];  Service: Gastroenterology;;  ? CERVICAL BIOPSY  W/ LOOP ELECTRODE  EXCISION    ? CESAREAN SECTION    ? COLONOSCOPY  08/2017  ? Dr. Fuller Plan;  prominent and moderately lipomatous ileocecal valve, 9 mm sessile polyp in hepatic flexure, 8 mm sessile polyp in the descending colon, otherwise normal exam.  Pathology with sessile serrated polyp and hyperplastic polyp.  Recommended repeat colonoscopy in 5 years.  ? COLONOSCOPY N/A 06/15/2020  ? Procedure: COLONOSCOPY;  Surgeon: Rogene Houston, MD; 2 polyps resected and retrieved, 1 small polyp in the distal sigmoid biopsy, external  hemorrhoids.  Pathology with 1 tubular adenoma and 1 hyperplastic polyp.   ? COLPOSCOPY    ? EGD with mucosal resection  03/16/2021  ? [facility]: 2 large plaques in the proximal-mid third of the esophagus s/p endoscopic submucosal resection and retrieval, placement of 4 MR conditional clips.  Normal stomach, normal examined duodenum.  Pathology revealed squamous cell carcinoma in situ focally extending to the peripheral inked margin.  No invasive carcinoma. Recommended repeat EGD in 3 weeks.  ? ESOPHAGEAL DILATION N/A 06/15/2020  ? Procedure: ESOPHAGEAL DILATION;  Surgeon: Rogene Houston, MD;  [facility];  Service: Endoscopy;  Laterality: N/A;  ? ESOPHAGOGASTRODUODENOSCOPY  08/2017  ? Dr. Fuller Plan; LA grade B esophagitis without bleeding s/p biopsied, diffuse moderate inflammation and granularity in the entire examined stomach s/p biopsied, normal examined duodenum s/p biopsy. Gastric biopsy with chronic inactive gastritis without H. pylori, esophageal biopsy with mildly inflamed squamous mucosa, duodenal biopsy benign.  ? ESOPHAGOGASTRODUODENOSCOPY N/A 06/15/2020  ? Procedure: ESOPHAGOGASTRODUODENOSCOPY (EGD);  Surgeon: Rogene Houston, MD;  Proximal esophageal web, focal esophagitis at proximal esophagus s/p biopsy, benign-appearing esophageal stenosis s/p dilated, portal hypertensive gastropathy.  Esophageal biopsy with moderate squamous dysplasia.    ? ESOPHAGOGASTRODUODENOSCOPY (EGD) WITH PROPOFOL  N/A 10/11/2020  ? Surgeon: Eloise Harman, DO; Eroded, inflamed mucosa in the esophagus. Biopsied. Portal hypertensive gastropathy.  Recommended PPI twice daily.  Pathology with moderate to severe squamous

## 2021-12-08 ENCOUNTER — Other Ambulatory Visit: Payer: Self-pay | Admitting: Family Medicine

## 2021-12-08 DIAGNOSIS — F5101 Primary insomnia: Secondary | ICD-10-CM

## 2021-12-08 DIAGNOSIS — F411 Generalized anxiety disorder: Secondary | ICD-10-CM

## 2021-12-08 DIAGNOSIS — F332 Major depressive disorder, recurrent severe without psychotic features: Secondary | ICD-10-CM

## 2021-12-13 ENCOUNTER — Other Ambulatory Visit: Payer: Self-pay | Admitting: Family Medicine

## 2021-12-13 DIAGNOSIS — M545 Low back pain, unspecified: Secondary | ICD-10-CM

## 2021-12-13 DIAGNOSIS — F411 Generalized anxiety disorder: Secondary | ICD-10-CM

## 2021-12-13 DIAGNOSIS — M4126 Other idiopathic scoliosis, lumbar region: Secondary | ICD-10-CM

## 2021-12-13 DIAGNOSIS — F332 Major depressive disorder, recurrent severe without psychotic features: Secondary | ICD-10-CM

## 2021-12-27 ENCOUNTER — Encounter: Payer: Self-pay | Admitting: Pulmonary Disease

## 2022-01-04 ENCOUNTER — Ambulatory Visit (INDEPENDENT_AMBULATORY_CARE_PROVIDER_SITE_OTHER): Payer: Medicare HMO | Admitting: Family Medicine

## 2022-01-04 ENCOUNTER — Encounter: Payer: Self-pay | Admitting: Family Medicine

## 2022-01-04 VITALS — BP 136/75 | HR 75 | Temp 97.8°F | Ht 61.0 in | Wt 164.0 lb

## 2022-01-04 DIAGNOSIS — F10982 Alcohol use, unspecified with alcohol-induced sleep disorder: Secondary | ICD-10-CM

## 2022-01-04 DIAGNOSIS — F1010 Alcohol abuse, uncomplicated: Secondary | ICD-10-CM | POA: Diagnosis not present

## 2022-01-04 DIAGNOSIS — Z0001 Encounter for general adult medical examination with abnormal findings: Secondary | ICD-10-CM

## 2022-01-04 DIAGNOSIS — R112 Nausea with vomiting, unspecified: Secondary | ICD-10-CM

## 2022-01-04 DIAGNOSIS — Z0000 Encounter for general adult medical examination without abnormal findings: Secondary | ICD-10-CM | POA: Diagnosis not present

## 2022-01-04 DIAGNOSIS — R1011 Right upper quadrant pain: Secondary | ICD-10-CM | POA: Diagnosis not present

## 2022-01-04 DIAGNOSIS — R160 Hepatomegaly, not elsewhere classified: Secondary | ICD-10-CM | POA: Diagnosis not present

## 2022-01-04 DIAGNOSIS — Z720 Tobacco use: Secondary | ICD-10-CM | POA: Diagnosis not present

## 2022-01-04 DIAGNOSIS — Z596 Low income: Secondary | ICD-10-CM

## 2022-01-04 DIAGNOSIS — F411 Generalized anxiety disorder: Secondary | ICD-10-CM | POA: Diagnosis not present

## 2022-01-04 DIAGNOSIS — F332 Major depressive disorder, recurrent severe without psychotic features: Secondary | ICD-10-CM

## 2022-01-04 DIAGNOSIS — R8290 Unspecified abnormal findings in urine: Secondary | ICD-10-CM | POA: Diagnosis not present

## 2022-01-04 DIAGNOSIS — G4700 Insomnia, unspecified: Secondary | ICD-10-CM

## 2022-01-04 LAB — URINALYSIS, ROUTINE W REFLEX MICROSCOPIC
Bilirubin, UA: NEGATIVE
Glucose, UA: NEGATIVE
Ketones, UA: NEGATIVE
Leukocytes,UA: NEGATIVE
Nitrite, UA: NEGATIVE
Protein,UA: NEGATIVE
RBC, UA: NEGATIVE
Specific Gravity, UA: <1.005 — ABNORMAL LOW (ref 1.005–1.030)
Urobilinogen, Ur: 0.2 mg/dL (ref 0.2–1.0)
pH, UA: 5.5 (ref 5.0–7.5)

## 2022-01-04 MED ORDER — ONDANSETRON 4 MG PO TBDP: 4.0000 mg | ORAL_TABLET | Freq: Three times a day (TID) | ORAL | 0 refills | Status: DC | PRN

## 2022-01-04 NOTE — Progress Notes (Unsigned)
Assessment & Plan:  Well adult exam Discussed health benefits of physical activity, and encouraged her to engage in regular exercise appropriate for her age and condition. Preventive health education provided. Patient declined a COVID booster.  Immunization History  Administered Date(s) Administered   Hep A / Hep B 05/10/2020   Influenza Inj Mdck Quad Pf 04/24/2017   Influenza,inj,Quad PF,6+ Mos 05/25/2015, 05/09/2016, 04/22/2017, 04/10/2018, 05/04/2019, 05/10/2020, 05/02/2021   Janssen (J&J) SARS-COV-2 Vaccination 12/10/2019   PNEUMOCOCCAL CONJUGATE-20 08/08/2021   Pneumococcal Polysaccharide-23 09/07/2015   Tdap 09/19/2021   Zoster Recombinat (Shingrix) 08/08/2021, 11/15/2021   Health Maintenance  Topic Date Due   COVID-19 Vaccine (2 - Janssen risk series) 12/03/2022 (Originally 01/07/2020)   INFLUENZA VACCINE  02/20/2022   MAMMOGRAM  06/22/2023   COLONOSCOPY (Pts 45-58yr Insurance coverage will need to be confirmed)  06/15/2025   TETANUS/TDAP  09/20/2031   Hepatitis C Screening  Completed   HIV Screening  Completed   Zoster Vaccines- Shingrix  Completed   HPV VACCINES  Aged Out   - CBC with Differential/Platelet - CMP14+EGFR - Lipid panel - TSH  2. Tobacco use Encouraged smoking cessation. - CT CHEST LUNG CA SCREEN LOW DOSE W/O CM; Future  3. Patient cannot afford medications Patient to set up an account with [facility] when she does she will let me know and I will send her prescription of Viibryd there.  4. Major depressive disorder, recurrent severe without psychotic features ([place] Uncontrolled, but patient does not want to change any medications.  - CMP14+EGFR  5. Generalized anxiety disorder Uncontrolled, but patient does not want to change any medications.  - CMP14+EGFR  6. Alcohol-induced insomnia ([place] Encouraged abstinence from alcohol. She did not increase Trazodone from one to two tablets as previously advised, so she will do  this tonight.  7. Hepatomegaly - CMP14+EGFR - Ambulatory referral to Gastroenterology - [place] RU[place] (LIVER/GB); Future  8. Alcohol abuse - CMP14+EGFR - Ambulatory referral to Gastroenterology - [place] RUQ (LIVER/GB); Future  9. RUQ abdominal pain - Ambulatory referral to Gastroenterology - [place] RUQ (LIVER/GB); Future  10. Nausea and vomiting in adult - Lipase - Amylase - ondansetron (ZOFRAN-ODT) 4 MG disintegrating tablet; Take 1 tablet (4 mg total) by mouth every 8 (eight) hours as needed for nausea or vomiting.  Dispense: 30 tablet; Refill: 0 - Ambulatory referral to Gastroenterology - [place] RU[place] (LIVER/GB); Future  11. Abnormal urine odor - Urinalysis, Routine w reflex microscopic   Follow-up: Return in about 3 months (around 04/06/2022) for follow-up of chronic medication conditions.   BHendricks Limes MSN, APRN, FNP-C [facility]  Subjective:  Patient ID: Regina Richardson female    DOB: 118-Oct-1962 Age: 61y.o. MRN: [id number] Patient Care Team: JLoman Brooklyn FNP as PCP - General (Family Medicine) CEloise Harman DO as Consulting Physician (Internal Medicine)   CC:  Chief Complaint  Patient presents with   Annual Exam    HPI Regina KLINGELis a 61y.o. female who presents today for a complete physical exam. She reports consuming a general diet. The patient does not participate in regular exercise at present. She generally feels poorly. She reports sleeping poorly. She does have additional problems to discuss today.   Vision:Within last year Dental:Receives regular dental care  Lung Cancer Screening with low-dose Chest CT: never, but agreeable  Depression/Anxiety: patient reports Viibryd's cost has increased to $96 per 30  days and she cannot afford this.     11/15/2021    4:03 PM 09/19/2021    2:35 PM 09/07/2021   10:42 AM  Depression screen PHQ 2/9  Decreased Interest 3 3 3   Down,  Depressed, Hopeless 3 3 3   PHQ - 2 Score 6 6 6   Altered sleeping 3 3 3   Tired, decreased energy 3 3 3   Change in appetite 3 3 3   Feeling bad or failure about yourself  3 3 3   Trouble concentrating 3 3 3   Moving slowly or fidgety/restless 3 2 0  Suicidal thoughts 0 1 1  PHQ-9 Score 24 24 22   Difficult doing work/chores  Extremely dIfficult Extremely dIfficult      11/15/2021    4:04 PM 09/19/2021    2:35 PM 09/07/2021   10:43 AM 08/08/2021   11:58 AM  GAD 7 : Generalized Anxiety Score  Nervous, Anxious, on Edge 3 3 3 3   Control/stop worrying 3 3 3 3   Worry too much - different things 3 3 3 3   Trouble relaxing 3 3 3 3   Restless 3 1 2 3   Easily annoyed or irritable 3 3 3 3   Afraid - awful might happen 1 3 3 3   Total GAD 7 Score 19 19 20 21   Anxiety Difficulty  Extremely difficult Extremely difficult Extremely difficult    Patient reports she throws up every day for the past month. It hits her out of nowhere. Occurring once daily. Emesis consists of whatever she just ate or drank. Denies blood or dark black substance. Denies any acid reflux.    Review of Systems  Constitutional:  Negative for chills, fever, malaise/fatigue and weight loss.  HENT:  Negative for congestion, ear discharge, ear pain, nosebleeds, sinus pain, sore throat and tinnitus.   Eyes:  Negative for blurred vision, double vision, pain, discharge and redness.  Respiratory:  Negative for cough, shortness of breath and wheezing.   Cardiovascular:  Negative for chest pain, palpitations and leg swelling.  Gastrointestinal:  Positive for abdominal pain, nausea and vomiting. Negative for constipation, diarrhea and heartburn.  Genitourinary:  Negative for dysuria, flank pain, frequency, hematuria and urgency.       Reports strong odor to her urine.  Musculoskeletal:  Negative for myalgias.  Skin:  Negative for rash.  Neurological:  Negative for dizziness, seizures, weakness and headaches.  Psychiatric/Behavioral:   Positive for depression. Negative for substance abuse and suicidal ideas. The patient is nervous/anxious and has insomnia.      Current Outpatient Medications:    allopurinol (ZYLOPRIM) 300 MG tablet, Take 1 tablet (300 mg total) by mouth daily., Disp: 90 tablet, Rfl: 1   buPROPion (WELLBUTRIN SR) 150 MG 12 hr tablet, Take 1 tablet (150 mg total) by mouth 2 (two) times daily., Disp: 180 tablet, Rfl: 1   diclofenac (VOLTAREN) 75 MG EC tablet, TAKE 1 TABLET BY MOUTH TWICE A DAY AS NEEDED, Disp: 60 tablet, Rfl: 0   methocarbamol (ROBAXIN) 500 MG tablet, Take 1 tablet (500 mg total) by mouth every 8 (eight) hours as needed for muscle spasms., Disp: 90 tablet, Rfl: 5   metoprolol succinate (TOPROL-XL) 50 MG 24 hr tablet, Take 1 tablet (50 mg total) by mouth daily., Disp: 90 tablet, Rfl: 1   pantoprazole (PROTONIX) 40 MG tablet, Take 1 tablet (40 mg total) by mouth daily before breakfast., Disp: 90 tablet, Rfl: 1   traZODone (DESYREL) 100 MG tablet, Take 2 tablets (200 mg total) by mouth at  bedtime., Disp: 180 tablet, Rfl: 1   Vilazodone HCl 20 MG TABS, Take 1 tablet (20 mg total) by mouth daily., Disp: 90 tablet, Rfl: 1  No Known Allergies  Past Medical History:  Diagnosis Date   Alcoholism ([place])    Anxiety    Arrhythmia    heart   Arthritis    Cancer ([place])    cervical cancer in the 80s   Cirrhosis ([place]) 07/7492   alcoholic;    Depression    Dysrhythmia    GERD (gastroesophageal reflux disease)    Hypertension    Osteoporosis    Scoliosis    Urinary incontinence     Past Surgical History:  Procedure Laterality Date   BIOPSY  10/11/2020   Procedure: BIOPSY;  Surgeon: Eloise Harman, DO;  [facility];  Service: Endoscopy;;   BIOPSY  12/12/2020   Procedure: BIOPSY;  Surgeon: Irving Copas., MD;  Location: [facility];  Service: Gastroenterology;;   CERVICAL BIOPSY  W/ LOOP ELECTRODE EXCISION     CESAREAN SECTION     COLONOSCOPY  08/2017   Dr. Fuller Plan;   prominent and moderately lipomatous ileocecal valve, 9 mm sessile polyp in hepatic flexure, 8 mm sessile polyp in the descending colon, otherwise normal exam.  Pathology with sessile serrated polyp and hyperplastic polyp.  Recommended repeat colonoscopy in 5 years.   COLONOSCOPY N/A 06/15/2020   Procedure: COLONOSCOPY;  Surgeon: Rogene Houston, MD; 2 polyps resected and retrieved, 1 small polyp in the distal sigmoid biopsy, external hemorrhoids.  Pathology with 1 tubular adenoma and 1 hyperplastic polyp.    COLPOSCOPY     EGD with mucosal resection  03/16/2021   [facility]: 2 large plaques in the proximal-mid third of the esophagus s/p endoscopic submucosal resection and retrieval, placement of 4 MR conditional clips.  Normal stomach, normal examined duodenum.  Pathology revealed squamous cell carcinoma in situ focally extending to the peripheral inked margin.  No invasive carcinoma. Recommended repeat EGD in 3 weeks.   ESOPHAGEAL DILATION N/A 06/15/2020   Procedure: ESOPHAGEAL DILATION;  Surgeon: Rogene Houston, MD;  Location: [facility];  Service: Endoscopy;  Laterality: N/A;   ESOPHAGOGASTRODUODENOSCOPY  08/2017   Dr. Fuller Plan; LA grade B esophagitis without bleeding s/p biopsied, diffuse moderate inflammation and granularity in the entire examined stomach s/p biopsied, normal examined duodenum s/p biopsy. Gastric biopsy with chronic inactive gastritis without H. pylori, esophageal biopsy with mildly inflamed squamous mucosa, duodenal biopsy benign.   ESOPHAGOGASTRODUODENOSCOPY N/A 06/15/2020   Procedure: ESOPHAGOGASTRODUODENOSCOPY (EGD);  Surgeon: Rogene Houston, MD;  Proximal esophageal web, focal esophagitis at proximal esophagus s/p biopsy, benign-appearing esophageal stenosis s/p dilated, portal hypertensive gastropathy.  Esophageal biopsy with moderate squamous dysplasia.     ESOPHAGOGASTRODUODENOSCOPY (EGD) WITH PROPOFOL N/A 10/11/2020   Surgeon: Eloise Harman, DO; Eroded, inflamed mucosa  in the esophagus. Biopsied. Portal hypertensive gastropathy.  Recommended PPI twice daily.  Pathology with moderate to severe squamous dysplasia.   ESOPHAGOGASTRODUODENOSCOPY (EGD) WITH PROPOFOL N/A 12/12/2020   Surgeon: Irving Copas., MD; Dionisio David, granular, mucosa in the esophagus at 23-26 cm.  Lesion 3 cm in length and encompassed 30-40% of esophageal wall s/p quick biopsy.  Erythematous mucosa in the stomach biopsied, portal hypertensive gastropathy.  Gastric biopsy with mild chronic inflammation, negative for H. pylori.  Esophageal biopsy with moderate to severe squamous dysplasia.   FINGER SURGERY     HEMORRHOID SURGERY     left collar bone surgery     had  fx, pin placed and then complitcations caused them to remove most of the bone   left forearm surgery     from left elbow down   TOTAL ABDOMINAL HYSTERECTOMY     still has ovaries   UPPER ESOPHAGEAL ENDOSCOPIC ULTRASOUND (EUS) N/A 12/12/2020   Surgeon: Irving Copas., MD;without esophageal wall thickening, 1 lymph node in the middle paraesophageal mediastinum that was suggestive of benign inflammatory changes, pancreatic lobularity in pancreatic body, queried possibility of chronic pancreatitis though complete EUS not performed and no history of pancreatitis.    Family History  Problem Relation Age of Onset   Alcohol abuse Mother    Liver disease Mother        alcoholic cirrhosis   Heart disease Father    Alcohol abuse Father    Lung cancer Father    Diabetes Sister    Cirrhosis Brother        secondary to alcohol.    Liver disease Brother    Liver disease Sister        Alcoholic cirrhosis   Diabetes Maternal Aunt    Kidney disease Maternal Aunt    Diabetes Cousin        mat cousin   Breast cancer Neg Hx    Colon cancer Neg Hx    Esophageal cancer Neg Hx    Stomach cancer Neg Hx    Rectal cancer Neg Hx    Pancreatic cancer Neg Hx    Inflammatory bowel disease Neg Hx     Social History    Socioeconomic History   Marital status: Married    Spouse name: Not on file   Number of children: 1   Years of education: Not on file   Highest education level: Not on file  Occupational History   Occupation: retired  Tobacco Use   Smoking status: Every Day    Packs/day: 1.50    Years: 40.00    Total pack years: 60.00    Types: Cigarettes   Smokeless tobacco: Never  Vaping Use   Vaping Use: Never used  Substance and Sexual Activity   Alcohol use: Yes    Alcohol/week: 12.0 standard drinks of alcohol    Types: 12 Cans of beer per week    Comment: 12 cans of beer daily   Drug use: No    Comment: Tried cocaine and marijuana in the 60s.    Sexual activity: Not Currently    Birth control/protection: None  Other Topics Concern   Not on file  Social History Narrative   Not on file   Social Determinants of Health   Financial Resource Strain: Not on file  Food Insecurity: Not on file  Transportation Needs: Not on file  Physical Activity: Not on file  Stress: Not on file  Social Connections: Not on file  Intimate Partner Violence: Not on file      Objective:    BP 136/75   Pulse 75   Temp 97.8 F (36.6 C) (Temporal)   Ht 5' 1"  (1.549 m)   Wt 164 lb (74.4 kg)   SpO2 98%   BMI 30.99 kg/m   Wt Readings from Last 3 Encounters:  01/04/22 164 lb (74.4 kg)  11/15/21 163 lb (73.9 kg)  09/19/21 160 lb 9.6 oz (72.8 kg)    Physical Exam Vitals reviewed.  Constitutional:      General: She is not in acute distress.    Appearance: Normal appearance. She is obese. She is not ill-appearing, toxic-appearing or  diaphoretic.  HENT:     Head: Normocephalic and atraumatic.     Right Ear: Tympanic membrane, ear canal and external ear normal. There is no impacted cerumen.     Left Ear: Tympanic membrane, ear canal and external ear normal. There is no impacted cerumen.     Nose: Nose normal. No congestion or rhinorrhea.     Mouth/Throat:     Mouth: Mucous membranes are moist.      Pharynx: Oropharynx is clear. No oropharyngeal exudate or posterior oropharyngeal erythema.  Eyes:     General: No scleral icterus.       Right eye: No discharge.        Left eye: No discharge.     Conjunctiva/sclera: Conjunctivae normal.     Pupils: Pupils are equal, round, and reactive to light.  Cardiovascular:     Rate and Rhythm: Normal rate and regular rhythm.     Heart sounds: Normal heart sounds. No murmur heard.    No friction rub. No gallop.  Pulmonary:     Effort: Pulmonary effort is normal. No respiratory distress.     Breath sounds: Normal breath sounds. No stridor. No wheezing, rhonchi or rales.  Abdominal:     General: Abdomen is flat. Bowel sounds are normal. There is no distension.     Palpations: Abdomen is soft. There is hepatomegaly. There is no splenomegaly or mass.     Tenderness: There is abdominal tenderness in the right upper quadrant. There is no guarding or rebound.     Hernia: No hernia is present.  Musculoskeletal:        General: Normal range of motion.     Cervical back: Normal range of motion and neck supple. No rigidity. No muscular tenderness.  Lymphadenopathy:     Cervical: No cervical adenopathy.  Skin:    General: Skin is warm and dry.     Capillary Refill: Capillary refill takes less than 2 seconds.  Neurological:     General: No focal deficit present.     Mental Status: She is alert and oriented to person, place, and time. Mental status is at baseline.  Psychiatric:        Mood and Affect: Mood normal.        Behavior: Behavior normal.        Thought Content: Thought content normal.        Judgment: Judgment normal.     Lab Results  Component Value Date   TSH 0.791 05/02/2021   Lab Results  Component Value Date   WBC 10.7 05/02/2021   HGB 12.9 05/02/2021   HCT 36.0 05/02/2021   MCV 89 05/02/2021   PLT 346 05/02/2021   Lab Results  Component Value Date   NA 130 (L) 08/08/2021   K 4.6 08/08/2021   CO2 23 08/08/2021    GLUCOSE 92 08/08/2021   BUN 5 (L) 08/08/2021   CREATININE 0.50 (L) 08/08/2021   BILITOT 0.4 05/02/2021   ALKPHOS 163 (H) 05/02/2021   AST 75 (H) 05/02/2021   ALT 30 05/02/2021   PROT 7.8 05/02/2021   ALBUMIN 4.3 05/02/2021   CALCIUM 9.5 08/08/2021   ANIONGAP 6 06/15/2020   EGFR 107 08/08/2021   GFR 100.04 12/01/2020   Lab Results  Component Value Date   CHOL 226 (H) 05/02/2021   Lab Results  Component Value Date   HDL 69 05/02/2021   Lab Results  Component Value Date   LDLCALC 137 (H) 05/02/2021   Lab Results  Component Value Date   TRIG 113 05/02/2021   Lab Results  Component Value Date   CHOLHDL 3.3 05/02/2021   No results found for: "HGBA1C"

## 2022-01-04 NOTE — Patient Instructions (Signed)
[place] - create an account.

## 2022-01-05 LAB — LIPID PANEL
Chol/HDL Ratio: 2.5 ratio (ref 0.0–4.4)
Cholesterol, Total: 220 mg/dL — ABNORMAL HIGH (ref 100–199)
HDL: 89 mg/dL (ref >39)
LDL Chol Calc (NIH): 114 mg/dL — ABNORMAL HIGH (ref 0–99)
Triglycerides: 100 mg/dL (ref 0–149)
VLDL Cholesterol Cal: 17 mg/dL (ref 5–40)

## 2022-01-05 LAB — CBC WITH DIFFERENTIAL/PLATELET
Basophils Absolute: 0.2 10*3/uL (ref 0.0–0.2)
Basos: 2 %
EOS (ABSOLUTE): 0.2 10*3/uL (ref 0.0–0.4)
Eos: 2 %
Hematocrit: 33 % — ABNORMAL LOW (ref 34.0–46.6)
Hemoglobin: 11.3 g/dL (ref 11.1–15.9)
Immature Grans (Abs): 0.1 10*3/uL (ref 0.0–0.1)
Immature Granulocytes: 1 %
Lymphocytes Absolute: 2.3 10*3/uL (ref 0.7–3.1)
Lymphs: 22 %
MCH: 32.7 pg (ref 26.6–33.0)
MCHC: 34.2 g/dL (ref 31.5–35.7)
MCV: 95 fL (ref 79–97)
Monocytes Absolute: 1.1 10*3/uL — ABNORMAL HIGH (ref 0.1–0.9)
Monocytes: 10 %
Neutrophils Absolute: 6.7 10*3/uL (ref 1.4–7.0)
Neutrophils: 63 %
Platelets: 310 10*3/uL (ref 150–450)
RBC: 3.46 x10E6/uL — ABNORMAL LOW (ref 3.77–5.28)
RDW: 12.3 % (ref 11.7–15.4)
WBC: 10.6 10*3/uL (ref 3.4–10.8)

## 2022-01-05 LAB — CMP14+EGFR
ALT: 30 IU/L (ref 0–32)
AST: 65 IU/L — ABNORMAL HIGH (ref 0–40)
Albumin/Globulin Ratio: 1.1 — ABNORMAL LOW (ref 1.2–2.2)
Albumin: 4.1 g/dL (ref 3.8–4.8)
Alkaline Phosphatase: 227 IU/L — ABNORMAL HIGH (ref 44–121)
BUN/Creatinine Ratio: 9 — ABNORMAL LOW (ref 12–28)
BUN: 5 mg/dL — ABNORMAL LOW (ref 8–27)
Bilirubin Total: 0.4 mg/dL (ref 0.0–1.2)
CO2: 21 mmol/L (ref 20–29)
Calcium: 9.2 mg/dL (ref 8.7–10.3)
Chloride: 94 mmol/L — ABNORMAL LOW (ref 96–106)
Creatinine, Ser: 0.56 mg/dL — ABNORMAL LOW (ref 0.57–1.00)
Globulin, Total: 3.9 g/dL (ref 1.5–4.5)
Glucose: 97 mg/dL (ref 70–99)
Potassium: 4.3 mmol/L (ref 3.5–5.2)
Sodium: 130 mmol/L — ABNORMAL LOW (ref 134–144)
Total Protein: 8 g/dL (ref 6.0–8.5)
eGFR: 104 mL/min/{1.73_m2} (ref >59)

## 2022-01-05 LAB — LIPASE: Lipase: 43 U/L (ref 14–72)

## 2022-01-05 LAB — AMYLASE: Amylase: 31 U/L (ref 31–110)

## 2022-01-05 LAB — TSH: TSH: 1.04 u[IU]/mL (ref 0.450–4.500)

## 2022-01-07 ENCOUNTER — Encounter: Payer: Self-pay | Admitting: Family Medicine

## 2022-01-08 ENCOUNTER — Ambulatory Visit: Payer: Medicare HMO | Admitting: Internal Medicine

## 2022-01-08 ENCOUNTER — Encounter: Payer: Self-pay | Admitting: Internal Medicine

## 2022-01-08 VITALS — BP 132/62 | HR 70 | Ht 60.0 in | Wt 167.4 lb

## 2022-01-08 DIAGNOSIS — K7030 Alcoholic cirrhosis of liver without ascites: Secondary | ICD-10-CM

## 2022-01-08 DIAGNOSIS — R072 Precordial pain: Secondary | ICD-10-CM | POA: Insufficient documentation

## 2022-01-08 DIAGNOSIS — R0602 Shortness of breath: Secondary | ICD-10-CM | POA: Diagnosis not present

## 2022-01-08 DIAGNOSIS — C159 Malignant neoplasm of esophagus, unspecified: Secondary | ICD-10-CM

## 2022-01-08 DIAGNOSIS — R079 Chest pain, unspecified: Secondary | ICD-10-CM | POA: Diagnosis not present

## 2022-01-08 DIAGNOSIS — I10 Essential (primary) hypertension: Secondary | ICD-10-CM

## 2022-01-08 DIAGNOSIS — F17200 Nicotine dependence, unspecified, uncomplicated: Secondary | ICD-10-CM | POA: Diagnosis not present

## 2022-01-08 NOTE — Patient Instructions (Signed)
Medication Instructions:  Your physician recommends that you continue on your current medications as directed. Please refer to the Current Medication list given to you today.   Labwork: None  Testing/Procedures: Your physician has requested that you have an echocardiogram. Echocardiography is a painless test that uses sound waves to create images of your heart. It provides your doctor with information about the size and shape of your heart and how well your heart's chambers and valves are working. This procedure takes approximately one hour. There are no restrictions for this procedure.  Your physician has requested that you have a lexiscan myoview. For further information please visit [email or web address]. Please follow instruction sheet, as given.  Your physician has recommended that you have a pulmonary function test. Pulmonary Function Tests are a group of tests that measure how well air moves in and out of your lungs.   Follow-Up: Follow up in 3 months with Dr. Gasper Sells or [facility]  Any Other Special Instructions Will Be Listed Below (If Applicable).     If you need a refill on your cardiac medications before your next appointment, please call your pharmacy.

## 2022-01-08 NOTE — Progress Notes (Signed)
Cardiology Office Note:    Date:  01/08/2022   ID:  Regina Richardson, DOB September 11, 1960, MRN [id number]  PCP:  Regina Brooklyn, FNP   [facility] Providers Cardiologist:  None     Referring MD: Regina Brooklyn, FNP   CC: Chest pain and SOB Consulted for the evaluation of palpitations at the behest of Regina Richardson  History of Present Illness:    Regina Richardson is a 61 y.o. female with a hx of HTN, Smoking history Alcohol use with Cirrhosis, Squamous cell carcinoma of the esophagus s/p three surgery (no chemoradiation) who presents for evaluation.  Patient notes that she is feeling poorly.   Was last feeling well when living in [place], since retiring and doing well Has chest pain when she lays down and tries to go to sleep. Has anxiety. Doesn't sleep well. Has cut back in on smoking because her   Unable to exert her self.  Notes some shortness of breath .  No PND or orthopnea.  No weight gain, leg swelling, or abdominal swelling.  No syncope or near syncope. Notes no palpitations or funny heart beats unless she is active.      Past Medical History:  Diagnosis Date   Alcoholism (Regina Richardson)    Anxiety    Arrhythmia    heart   Arthritis    Cancer (Mariano Colon)    cervical cancer in the 80s   Cirrhosis ([place]) 33/2951   alcoholic;    Depression    Dysrhythmia    GERD (gastroesophageal reflux disease)    Hypertension    Osteoporosis    Scoliosis    Urinary incontinence     Past Surgical History:  Procedure Laterality Date   BIOPSY  10/11/2020   Procedure: BIOPSY;  Surgeon: Regina Harman, DO;  Location: [facility];  Service: Endoscopy;;   BIOPSY  12/12/2020   Procedure: BIOPSY;  Surgeon: Regina Copas., MD;  Location: [facility];  Service: Gastroenterology;;   CERVICAL BIOPSY  W/ LOOP ELECTRODE EXCISION     CESAREAN SECTION     COLONOSCOPY  08/2017   Dr. Fuller Richardson;  prominent and moderately lipomatous ileocecal valve, 9 mm sessile polyp in hepatic flexure, 8 mm  sessile polyp in the descending colon, otherwise normal exam.  Pathology with sessile serrated polyp and hyperplastic polyp.  Recommended repeat colonoscopy in 5 years.   COLONOSCOPY N/A 06/15/2020   Procedure: COLONOSCOPY;  Surgeon: Regina Houston, MD; 2 polyps resected and retrieved, 1 small polyp in the distal sigmoid biopsy, external hemorrhoids.  Pathology with 1 tubular adenoma and 1 hyperplastic polyp.    COLPOSCOPY     EGD with mucosal resection  03/16/2021   Regina Richardson: 2 large plaques in the proximal-mid third of the esophagus s/p endoscopic submucosal resection and retrieval, placement of 4 MR conditional clips.  Normal stomach, normal examined duodenum.  Pathology revealed squamous cell carcinoma in situ focally extending to the peripheral inked margin.  No invasive carcinoma. Recommended repeat EGD in 3 weeks.   ESOPHAGEAL DILATION N/A 06/15/2020   Procedure: ESOPHAGEAL DILATION;  Surgeon: Regina Houston, MD;  Location: [facility];  Service: Endoscopy;  Laterality: N/A;   ESOPHAGOGASTRODUODENOSCOPY  08/2017   Dr. Fuller Richardson; LA grade B esophagitis without bleeding s/p biopsied, diffuse moderate inflammation and granularity in the entire examined stomach s/p biopsied, normal examined duodenum s/p biopsy. Gastric biopsy with chronic inactive gastritis without H. pylori, esophageal biopsy with mildly inflamed squamous mucosa, duodenal biopsy benign.  ESOPHAGOGASTRODUODENOSCOPY N/A 06/15/2020   Procedure: ESOPHAGOGASTRODUODENOSCOPY (EGD);  Surgeon: Regina Houston, MD;  Proximal esophageal web, focal esophagitis at proximal esophagus s/p biopsy, benign-appearing esophageal stenosis s/p dilated, portal hypertensive gastropathy.  Esophageal biopsy with moderate squamous dysplasia.     ESOPHAGOGASTRODUODENOSCOPY (EGD) WITH PROPOFOL N/A 10/11/2020   Surgeon: Regina Harman, DO; Eroded, inflamed mucosa in the esophagus. Biopsied. Portal hypertensive gastropathy.  Recommended PPI twice daily.   Pathology with moderate to severe squamous dysplasia.   ESOPHAGOGASTRODUODENOSCOPY (EGD) WITH PROPOFOL N/A 12/12/2020   Surgeon: Regina Copas., MD; Dionisio David, granular, mucosa in the esophagus at 23-26 cm.  Lesion 3 cm in length and encompassed 30-40% of esophageal wall s/p quick biopsy.  Erythematous mucosa in the stomach biopsied, portal hypertensive gastropathy.  Gastric biopsy with mild chronic inflammation, negative for H. pylori.  Esophageal biopsy with moderate to severe squamous dysplasia.   FINGER SURGERY     HEMORRHOID SURGERY     left collar bone surgery     had fx, pin placed and then complitcations caused them to remove most of the bone   left forearm surgery     from left elbow down   TOTAL ABDOMINAL HYSTERECTOMY     still has ovaries   UPPER ESOPHAGEAL ENDOSCOPIC ULTRASOUND (EUS) N/A 12/12/2020   Surgeon: Regina Copas., MD;without esophageal wall thickening, 1 lymph node in the middle paraesophageal mediastinum that was suggestive of benign inflammatory changes, pancreatic lobularity in pancreatic body, queried possibility of chronic pancreatitis though complete EUS not performed and no history of pancreatitis.    Current Medications: Current Meds  Medication Sig   allopurinol (ZYLOPRIM) 300 MG tablet Take 1 tablet (300 mg total) by mouth daily.   buPROPion (WELLBUTRIN SR) 150 MG 12 hr tablet Take 1 tablet (150 mg total) by mouth 2 (two) times daily.   diclofenac (VOLTAREN) 75 MG EC tablet TAKE 1 TABLET BY MOUTH TWICE A DAY AS NEEDED   methocarbamol (ROBAXIN) 500 MG tablet Take 1 tablet (500 mg total) by mouth every 8 (eight) hours as needed for muscle spasms.   metoprolol succinate (TOPROL-XL) 50 MG 24 hr tablet Take 1 tablet (50 mg total) by mouth daily.   ondansetron (ZOFRAN-ODT) 4 MG disintegrating tablet Take 1 tablet (4 mg total) by mouth every 8 (eight) hours as needed for nausea or vomiting.   pantoprazole (PROTONIX) 40 MG tablet Take 1 tablet (40 mg  total) by mouth daily before breakfast.   traZODone (DESYREL) 100 MG tablet Take 2 tablets (200 mg total) by mouth at bedtime.   Vilazodone HCl 20 MG TABS Take 1 tablet (20 mg total) by mouth daily.     Allergies:   Patient has no known allergies.   Social History   Socioeconomic History   Marital status: Married    Spouse name: Not on file   Number of children: 1   Years of education: Not on file   Highest education level: Not on file  Occupational History   Occupation: retired  Tobacco Use   Smoking status: Every Day    Packs/day: 1.50    Years: 40.00    Total pack years: 60.00    Types: Cigarettes   Smokeless tobacco: Never  Vaping Use   Vaping Use: Never used  Substance and Sexual Activity   Alcohol use: Yes    Alcohol/week: 12.0 standard drinks of alcohol    Types: 12 Cans of beer per week    Comment: 12 cans of beer daily  Drug use: No    Comment: Tried cocaine and marijuana in the 60s.    Sexual activity: Not Currently    Birth control/protection: None  Other Topics Concern   Not on file  Social History Narrative   Not on file   Social Determinants of Health   Financial Resource Strain: Not on file  Food Insecurity: Not on file  Transportation Needs: Not on file  Physical Activity: Not on file  Stress: Not on file  Social Connections: Not on file   Social: from [place] (was form here), has a baby (not a dog) named Glass blower/designer), has a son  Family History: The patient's family history includes Alcohol abuse in her father and mother; Cirrhosis in her brother; Diabetes in her cousin, maternal aunt, and sister; Heart disease in her father; Kidney disease in her maternal aunt; Liver disease in her brother, mother, and sister; Lung cancer in her father. There is no history of Breast cancer, Colon cancer, Esophageal cancer, Stomach cancer, Rectal cancer, Pancreatic cancer, or Inflammatory bowel disease.  ROS:   Please see the history of present illness.     All  other systems reviewed and are negative.  EKGs/Labs/Other Studies Reviewed:    The following studies were reviewed today:  EKG:  EKG is  ordered today.  The ekg ordered today demonstrates  01/08/22: Sinus rhythm with 1st HB  Recent Labs: 05/02/2021: Magnesium 1.8 01/04/2022: ALT 30; BUN 5; Creatinine, Ser 0.56; Hemoglobin 11.3; Platelets 310; Potassium 4.3; Sodium 130; TSH 1.040  Recent Lipid Panel    Component Value Date/Time   CHOL 220 (H) 01/04/2022 1407   TRIG 100 01/04/2022 1407   HDL 89 01/04/2022 1407   CHOLHDL 2.5 01/04/2022 1407   CHOLHDL 4.0 05/04/2019 1108   VLDL 16 09/07/2015 1110   LDLCALC 114 (H) 01/04/2022 1407   LDLCALC 145 (H) 05/04/2019 1108      Physical Exam:    VS:  BP 132/62   Pulse 70   Ht 5' (1.524 m)   Wt 167 lb 6.4 oz (75.9 kg)   SpO2 95%   BMI 32.69 kg/m     Wt Readings from Last 3 Encounters:  01/08/22 167 lb 6.4 oz (75.9 kg)  01/04/22 164 lb (74.4 kg)  11/15/21 163 lb (73.9 kg)    Gen: No distress   Neck: No JVD Cardiac: No Rubs or Gallops, no murmur RRR +2 radial pulses Respiratory: Clear to auscultation bilaterally, normal effort, normal  respiratory rate GI: Soft, nontender, non-distended  MS: No edema;  moves all extremities Integument: Skin feels warm Neuro:  At time of evaluation, alert and oriented to person/place/time/situation  Psych: Normal affect, patient feels warm  ASSESSMENT:    1. Essential hypertension   2. SOB (shortness of breath)   3. Chest pain of uncertain etiology   4. Chest pain, unspecified type   5. Precordial chest pain   6. Smoker   7. Squamous cell carcinoma of esophagus (HCC)   8. Alcoholic cirrhosis of liver without ascites (HCC)    Richardson:    New CP New SOB HTN Tobacco Abuse- discussing smoking cessation and will get PFTs (also had heat intolerance) Alcohol abuse and cirrhosis Hf of squamous cell carcinoma of the throat - we will continue her current medications - I would like to get a  cardiac CT, but given her travel limitations she presently can really only get testing in [place]: will get Lexiscan and echo in [place]; if abnormal will get CCTA or  LHC in [place]   Fall f/u me or APP       Medication Adjustments/Labs and Tests Ordered: Current medicines are reviewed at length with the patient today.  Concerns regarding medicines are outlined above.  Orders Placed This Encounter  Procedures   NM Myocar Multi W/Spect W/Wall Motion / EF   EKG 12-Lead   ECHOCARDIOGRAM COMPLETE   Pulmonary function test   No orders of the defined types were placed in this encounter.   Patient Instructions  Medication Instructions:  Your physician recommends that you continue on your current medications as directed. Please refer to the Current Medication list given to you today.   Labwork: None  Testing/Procedures: Your physician has requested that you have an echocardiogram. Echocardiography is a painless test that uses sound waves to create images of your heart. It provides your doctor with information about the size and shape of your heart and how well your heart's chambers and valves are working. This procedure takes approximately one hour. There are no restrictions for this procedure.  Your physician has requested that you have a lexiscan myoview. For further information please visit [email or web address]. Please follow instruction sheet, as given.  Your physician has recommended that you have a pulmonary function test. Pulmonary Function Tests are a group of tests that measure how well air moves in and out of your lungs.   Follow-Up: Follow up in 3 months with Dr. Gasper Sells or APP  Any Other Special Instructions Will Be Listed Below (If Applicable).     If you need a refill on your cardiac medications before your next appointment, please call your pharmacy.    Signed, Werner Lean, MD  01/08/2022 10:35 AM    [place]

## 2022-01-12 ENCOUNTER — Encounter (HOSPITAL_COMMUNITY)
Admission: RE | Admit: 2022-01-12 | Discharge: 2022-01-12 | Disposition: A | Discharge status: Home / Self Care | Payer: Medicare HMO | Source: Ambulatory Visit | Attending: Internal Medicine | Admitting: Internal Medicine

## 2022-01-12 ENCOUNTER — Ambulatory Visit (HOSPITAL_COMMUNITY)
Admission: RE | Admit: 2022-01-12 | Discharge: 2022-01-12 | Disposition: A | Discharge status: Home / Self Care | Payer: Medicare HMO | Source: Ambulatory Visit | Attending: Internal Medicine | Admitting: Internal Medicine

## 2022-01-12 ENCOUNTER — Encounter (HOSPITAL_COMMUNITY): Payer: Self-pay

## 2022-01-12 DIAGNOSIS — R079 Chest pain, unspecified: Secondary | ICD-10-CM | POA: Insufficient documentation

## 2022-01-12 LAB — NM MYOCAR MULTI W/SPECT W/WALL MOTION / EF
LV dias vol: 88 mL (ref 46–106)
LV sys vol: 28 mL
Nuc Stress EF: 69 %
Peak HR: 91 {beats}/min
RATE: 0.4
Rest HR: 66 {beats}/min
Rest Nuclear Isotope Dose: 11 mCi
SDS: 0
SRS: 0
SSS: 0
ST Depression (mm): 0 mm
Stress Nuclear Isotope Dose: 29.9 mCi
TID: 1.11

## 2022-01-12 MED ORDER — TECHNETIUM TC 99M TETROFOSMIN IV KIT
30.0000 | PACK | Freq: Once | INTRAVENOUS | Status: AC | PRN
  Administered 2022-01-12: 29.9 via INTRAVENOUS

## 2022-01-12 MED ORDER — TECHNETIUM TC 99M TETROFOSMIN IV KIT
10.0000 | PACK | Freq: Once | INTRAVENOUS | Status: AC | PRN
  Administered 2022-01-12: 11 via INTRAVENOUS

## 2022-01-12 MED ORDER — SODIUM CHLORIDE FLUSH 0.9 % IV SOLN
INTRAVENOUS | Status: AC
  Administered 2022-01-12: 10 mL via INTRAVENOUS
  Filled 2022-01-12: qty 10

## 2022-01-12 MED ORDER — REGADENOSON 0.4 MG/5ML IV SOLN
INTRAVENOUS | Status: AC
  Administered 2022-01-12: 0.4 mg via INTRAVENOUS
  Filled 2022-01-12: qty 5

## 2022-01-14 ENCOUNTER — Other Ambulatory Visit: Payer: Self-pay | Admitting: Family Medicine

## 2022-01-14 DIAGNOSIS — G8929 Other chronic pain: Secondary | ICD-10-CM

## 2022-01-14 DIAGNOSIS — M4126 Other idiopathic scoliosis, lumbar region: Secondary | ICD-10-CM

## 2022-01-15 ENCOUNTER — Inpatient Hospital Stay (HOSPITAL_COMMUNITY): Admission: RE | Admit: 2022-01-15 | Payer: Medicare HMO | Source: Ambulatory Visit

## 2022-01-16 ENCOUNTER — Encounter: Payer: Self-pay | Admitting: Internal Medicine

## 2022-01-18 ENCOUNTER — Ambulatory Visit (HOSPITAL_COMMUNITY): Payer: Medicare HMO

## 2022-01-24 ENCOUNTER — Other Ambulatory Visit: Payer: Self-pay | Admitting: Family Medicine

## 2022-01-24 DIAGNOSIS — R112 Nausea with vomiting, unspecified: Secondary | ICD-10-CM

## 2022-02-19 ENCOUNTER — Other Ambulatory Visit: Payer: Self-pay | Admitting: Family Medicine

## 2022-02-19 DIAGNOSIS — R112 Nausea with vomiting, unspecified: Secondary | ICD-10-CM

## 2022-02-25 ENCOUNTER — Other Ambulatory Visit: Payer: Self-pay | Admitting: Family Medicine

## 2022-02-25 DIAGNOSIS — F332 Major depressive disorder, recurrent severe without psychotic features: Secondary | ICD-10-CM

## 2022-03-08 ENCOUNTER — Ambulatory Visit: Payer: Medicare HMO | Admitting: Gastroenterology

## 2022-03-14 ENCOUNTER — Other Ambulatory Visit: Payer: Self-pay | Admitting: Family Medicine

## 2022-03-14 DIAGNOSIS — I10 Essential (primary) hypertension: Secondary | ICD-10-CM

## 2022-03-29 ENCOUNTER — Telehealth: Payer: Self-pay | Admitting: Gastroenterology

## 2022-03-29 NOTE — Telephone Encounter (Signed)
Pt left a message to call her back.  When I called her she wanted to schedule an appt but she said it had to be after 3.  I looked but offered 3:00 since that was the latest I had.  She said she couldn't take it knowing she would not be able to get here because her ride doesn't get off work until 3:00.

## 2022-04-10 ENCOUNTER — Telehealth: Payer: Self-pay

## 2022-04-10 ENCOUNTER — Encounter: Payer: Self-pay | Admitting: Family Medicine

## 2022-04-10 ENCOUNTER — Ambulatory Visit (INDEPENDENT_AMBULATORY_CARE_PROVIDER_SITE_OTHER): Payer: Medicare HMO | Admitting: Family Medicine

## 2022-04-10 VITALS — BP 135/68 | HR 70 | Temp 97.7°F | Resp 20 | Ht 60.0 in | Wt 161.0 lb

## 2022-04-10 DIAGNOSIS — Z23 Encounter for immunization: Secondary | ICD-10-CM | POA: Diagnosis not present

## 2022-04-10 DIAGNOSIS — K219 Gastro-esophageal reflux disease without esophagitis: Secondary | ICD-10-CM

## 2022-04-10 DIAGNOSIS — I10 Essential (primary) hypertension: Secondary | ICD-10-CM | POA: Diagnosis not present

## 2022-04-10 DIAGNOSIS — F332 Major depressive disorder, recurrent severe without psychotic features: Secondary | ICD-10-CM

## 2022-04-10 DIAGNOSIS — F411 Generalized anxiety disorder: Secondary | ICD-10-CM | POA: Diagnosis not present

## 2022-04-10 DIAGNOSIS — F17200 Nicotine dependence, unspecified, uncomplicated: Secondary | ICD-10-CM

## 2022-04-10 DIAGNOSIS — R160 Hepatomegaly, not elsewhere classified: Secondary | ICD-10-CM

## 2022-04-10 DIAGNOSIS — F10982 Alcohol use, unspecified with alcohol-induced sleep disorder: Secondary | ICD-10-CM

## 2022-04-10 DIAGNOSIS — K7030 Alcoholic cirrhosis of liver without ascites: Secondary | ICD-10-CM | POA: Diagnosis not present

## 2022-04-10 DIAGNOSIS — R112 Nausea with vomiting, unspecified: Secondary | ICD-10-CM | POA: Diagnosis not present

## 2022-04-10 DIAGNOSIS — Z5982 Transportation insecurity: Secondary | ICD-10-CM

## 2022-04-10 MED ORDER — ONDANSETRON 4 MG PO TBDP: 4.0000 mg | ORAL_TABLET | Freq: Three times a day (TID) | ORAL | 2 refills | Status: DC | PRN

## 2022-04-10 NOTE — Telephone Encounter (Signed)
   Telephone encounter was:  Unsuccessful.  04/10/2022 Name: Regina Richardson MRN: [id number] DOB: 03-Sep-1960  Unsuccessful outbound call made today to assist with:  Unable to leave message, voicemail does not pick-up.  Outreach Attempt:  1st Attempt  A HIPAA compliant voice message was left requesting a return call.  Instructed patient to call back at [phone].  [place] [facility] Care Guide   [name]  ?? [phone]   Website: [email or web address]  [email or web address]  "We don't say no, we SHOW how!"         [facility]

## 2022-04-10 NOTE — Progress Notes (Signed)
Assessment & Plan:  1. Major depressive disorder, recurrent severe without psychotic features ([place]) Uncontrolled, but patient declines changes in medications. - CMP14+EGFR  2. Generalized anxiety disorder Uncontrolled, but patient declines changes in medications. - CMP14+EGFR  3. Essential hypertension Well controlled on current regimen.  - CBC with Differential/Platelet - CMP14+EGFR - Lipid panel  4. Gastro-esophageal reflux disease without esophagitis Encouraged patient to schedule an appointment with GI. - CMP14+EGFR  5-7. Nausea and vomiting in adult/Alcoholic cirrhosis, unspecified whether ascites present (HCC)/Hepatomegaly Encouraged patient to schedule an appointment with GI.  Abdominal ultrasound reordered.  Referral to [place] entered to help with transportation issues. - ondansetron (ZOFRAN-ODT) 4 MG disintegrating tablet; Take 1 tablet (4 mg total) by mouth every 8 (eight) hours as needed for nausea or vomiting.  Dispense: 60 tablet; Refill: 2 - US Abdomen Limited RUQ (LIVER/GB); Future - CMP14+EGFR  8. Alcohol-induced insomnia ([place]) Patient continues to drink excessively. - CMP14+EGFR - US Abdomen Limited RUQ (LIVER/GB); Future  9. Smoker - CT CHEST LUNG CA SCREEN LOW DOSE W/O CM; Future  10. Lack of access to transportation - AMB Referral to [place]  11. Need for immunization against influenza Influenza given in office today.   Return in about 4 months (around 08/10/2022) for follow-up of chronic medication conditions with Dr. Livia Snellen.  Hendricks Limes, MSN, APRN, FNP-C [facility]  Subjective:    Patient ID: Regina Richardson, female    DOB: [birth year], 61 y.o.   MRN: [id number]  Patient Care Team: Landgrebe Brooklyn, FNP as PCP - General (Family Medicine) Eloise Harman, DO as Consulting Physician (Internal Medicine)   Chief Complaint:  Chief Complaint  Patient presents with   Medical  Management of Chronic Issues    3 mo     HPI: Regina Richardson is a 61 y.o. female presenting on 04/10/2022 for Medical Management of Chronic Issues (3 mo )  Depression/Anxiety: patient was started on Viibryd which she previously reported she was unable to afford. She was advised to create an account with [employer] which she did not do, however it appears she has been getting regular refills of the medication. In the past she has failed treatment with Cymbalta, Effexor, Pristiq, and Celexa.  She does not wish to make any changes to her regimen at this time.     04/10/2022   10:08 AM 01/04/2022    2:25 PM 11/15/2021    4:03 PM  Depression screen PHQ 2/9  Decreased Interest _0 Down, Depressed, Hopeless _1 PHQ - 2 Score _2 Altered sleeping _3 Tired, decreased energy _4 Change in appetite _5 Feeling bad or failure about yourself  _6 Trouble concentrating _7 Moving slowly or fidgety/restless 3 0 3  Suicidal thoughts 3 0 0  PHQ-9 Score _8 Difficult doing work/chores Somewhat difficult Extremely dIfficult       04/10/2022   10:08 AM 01/04/2022    2:25 PM 11/15/2021    4:04 PM 09/19/2021    2:35 PM  GAD 7 : Generalized Anxiety Score  Nervous, Anxious, on Edge _9 Control/stop worrying _10 Worry too much - different things _11 Trouble relaxing _12 Restless _13 Easily annoyed or irritable 3  _0 Afraid - awful might happen _1 Total GAD 7 Score _2 Anxiety Difficulty Somewhat difficult Extremely difficult  Extremely difficult   Tobacco use: Patient was previously ordered a low-dose lung cancer screening CT, which has not been scheduled.  She does continue to smoke.  RUQ abdominal pain/Nausea and Vomiting/Hepatomegaly/Alcohol Abuse: Patient was previously referred to gastroenterology.  An appointment has not been scheduled as patient states she needs to be seen after 3 PM and their last  appointment time is at 3 PM; this is due to transportation issues.  An abdominal ultrasound was also ordered, which was not completed due to lack of transportation the day it was scheduled.  New complaints: None   Social history:  Relevant past medical, surgical, family and social history reviewed and updated as indicated. Interim medical history since our last visit reviewed.  Allergies and medications reviewed and updated.  DATA REVIEWED: CHART IN EPIC  ROS: Negative unless specifically indicated above in HPI.    Current Outpatient Medications:    allopurinol (ZYLOPRIM) 300 MG tablet, Take 1 tablet (300 mg total) by mouth daily., Disp: 90 tablet, Rfl: 1   buPROPion (WELLBUTRIN SR) 150 MG 12 hr tablet, TAKE 1 TABLET BY MOUTH TWICE A DAY, Disp: 180 tablet, Rfl: 0   diclofenac (VOLTAREN) 75 MG EC tablet, TAKE 1 TABLET BY MOUTH TWICE A DAY AS NEEDED, Disp: 60 tablet, Rfl: 2   methocarbamol (ROBAXIN) 500 MG tablet, Take 1 tablet (500 mg total) by mouth every 8 (eight) hours as needed for muscle spasms., Disp: 90 tablet, Rfl: 5   metoprolol succinate (TOPROL-XL) 50 MG 24 hr tablet, TAKE 1 TABLET BY MOUTH EVERY DAY, Disp: 90 tablet, Rfl: 0   pantoprazole (PROTONIX) 40 MG tablet, Take 1 tablet (40 mg total) by mouth daily before breakfast., Disp: 90 tablet, Rfl: 1   traZODone (DESYREL) 100 MG tablet, Take 2 tablets (200 mg total) by mouth at bedtime., Disp: 180 tablet, Rfl: 1   Vilazodone HCl 20 MG TABS, Take 1 tablet (20 mg total) by mouth daily., Disp: 90 tablet, Rfl: 1   ondansetron (ZOFRAN-ODT) 4 MG disintegrating tablet, TAKE 1 TABLET BY MOUTH EVERY 8 HOURS AS NEEDED FOR NAUSEA AND VOMITING (Patient not taking: Reported on 04/10/2022), Disp: 60 tablet, Rfl: 2   No Known Allergies Past Medical History:  Diagnosis Date   Alcoholism ([place])    Anxiety    Arrhythmia    heart   Arthritis    Cancer ([place])    cervical cancer in the 80s   Cirrhosis ([place]) 16/1096   alcoholic;    Depression     Dysrhythmia    GERD (gastroesophageal reflux disease)    Hypertension    Osteoporosis    Scoliosis    Urinary incontinence     Past Surgical History:  Procedure Laterality Date   BIOPSY  10/11/2020   Procedure: BIOPSY;  Surgeon: Eloise Harman, DO;  [facility];  Service: Endoscopy;;   BIOPSY  12/12/2020   Procedure: BIOPSY;  Surgeon: Irving Copas., MD;  Location: [facility];  Service: Gastroenterology;;   CERVICAL BIOPSY  W/ LOOP ELECTRODE EXCISION     CESAREAN SECTION     COLONOSCOPY  08/2017   Dr. Fuller Plan;  prominent and moderately lipomatous ileocecal valve, 9 mm sessile polyp in hepatic flexure, 8 mm sessile polyp in the descending colon, otherwise normal exam.  Pathology with sessile serrated polyp and hyperplastic  polyp.  Recommended repeat colonoscopy in 5 years.   COLONOSCOPY N/A 06/15/2020   Procedure: COLONOSCOPY;  Surgeon: Rogene Houston, MD; 2 polyps resected and retrieved, 1 small polyp in the distal sigmoid biopsy, external hemorrhoids.  Pathology with 1 tubular adenoma and 1 hyperplastic polyp.    COLPOSCOPY     EGD with mucosal resection  03/16/2021   [facility]: 2 large plaques in the proximal-mid third of the esophagus s/p endoscopic submucosal resection and retrieval, placement of 4 MR conditional clips.  Normal stomach, normal examined duodenum.  Pathology revealed squamous cell carcinoma in situ focally extending to the peripheral inked margin.  No invasive carcinoma. Recommended repeat EGD in 3 weeks.   ESOPHAGEAL DILATION N/A 06/15/2020   Procedure: ESOPHAGEAL DILATION;  Surgeon: Rogene Houston, MD;  Location: [facility];  Service: Endoscopy;  Laterality: N/A;   ESOPHAGOGASTRODUODENOSCOPY  08/2017   Dr. Fuller Plan; LA grade B esophagitis without bleeding s/p biopsied, diffuse moderate inflammation and granularity in the entire examined stomach s/p biopsied, normal examined duodenum s/p biopsy. Gastric biopsy with chronic inactive gastritis  without H. pylori, esophageal biopsy with mildly inflamed squamous mucosa, duodenal biopsy benign.   ESOPHAGOGASTRODUODENOSCOPY N/A 06/15/2020   Procedure: ESOPHAGOGASTRODUODENOSCOPY (EGD);  Surgeon: Rogene Houston, MD;  Proximal esophageal web, focal esophagitis at proximal esophagus s/p biopsy, benign-appearing esophageal stenosis s/p dilated, portal hypertensive gastropathy.  Esophageal biopsy with moderate squamous dysplasia.     ESOPHAGOGASTRODUODENOSCOPY (EGD) WITH PROPOFOL N/A 10/11/2020   Surgeon: Eloise Harman, DO; Eroded, inflamed mucosa in the esophagus. Biopsied. Portal hypertensive gastropathy.  Recommended PPI twice daily.  Pathology with moderate to severe squamous dysplasia.   ESOPHAGOGASTRODUODENOSCOPY (EGD) WITH PROPOFOL N/A 12/12/2020   Surgeon: Irving Copas., MD; Dionisio David, granular, mucosa in the esophagus at 23-26 cm.  Lesion 3 cm in length and encompassed 30-40% of esophageal wall s/p quick biopsy.  Erythematous mucosa in the stomach biopsied, portal hypertensive gastropathy.  Gastric biopsy with mild chronic inflammation, negative for H. pylori.  Esophageal biopsy with moderate to severe squamous dysplasia.   FINGER SURGERY     HEMORRHOID SURGERY     left collar bone surgery     had fx, pin placed and then complitcations caused them to remove most of the bone   left forearm surgery     from left elbow down   TOTAL ABDOMINAL HYSTERECTOMY     still has ovaries   UPPER ESOPHAGEAL ENDOSCOPIC ULTRASOUND (EUS) N/A 12/12/2020   Surgeon: Irving Copas., MD;without esophageal wall thickening, 1 lymph node in the middle paraesophageal mediastinum that was suggestive of benign inflammatory changes, pancreatic lobularity in pancreatic body, queried possibility of chronic pancreatitis though complete EUS not performed and no history of pancreatitis.    Social History   Socioeconomic History   Marital status: Married    Spouse name: Not on file   Number of  children: 1   Years of education: Not on file   Highest education level: Not on file  Occupational History   Occupation: retired  Tobacco Use   Smoking status: Every Day    Packs/day: 1.50    Years: 40.00    Total pack years: 60.00    Types: Cigarettes   Smokeless tobacco: Never  Vaping Use   Vaping Use: Never used  Substance and Sexual Activity   Alcohol use: Yes    Alcohol/week: 12.0 standard drinks of alcohol    Types: 12 Cans of beer per week    Comment: 12 cans of  beer daily   Drug use: No    Comment: Tried cocaine and marijuana in the 60s.    Sexual activity: Not Currently    Birth control/protection: None  Other Topics Concern   Not on file  Social History Narrative   Not on file   Social Determinants of Health   Financial Resource Strain: Not on file  Food Insecurity: Not on file  Transportation Needs: Not on file  Physical Activity: Not on file  Stress: Not on file  Social Connections: Not on file  Intimate Partner Violence: Not on file        Objective:    BP 135/68   Pulse 70   Temp 97.7 F (36.5 C)   Resp 20   Ht 5' (1.524 m)   Wt 161 lb (73 kg)   SpO2 99%   BMI 31.44 kg/m   Wt Readings from Last 3 Encounters:  04/10/22 161 lb (73 kg)  01/08/22 167 lb 6.4 oz (75.9 kg)  01/04/22 164 lb (74.4 kg)    Physical Exam Vitals reviewed.  Constitutional:      General: She is not in acute distress.    Appearance: Normal appearance. She is obese. She is not ill-appearing, toxic-appearing or diaphoretic.  HENT:     Head: Normocephalic and atraumatic.  Eyes:     General: No scleral icterus.       Right eye: No discharge.        Left eye: No discharge.     Conjunctiva/sclera: Conjunctivae normal.  Cardiovascular:     Rate and Rhythm: Normal rate and regular rhythm.     Heart sounds: Normal heart sounds. No murmur heard.    No friction rub. No gallop.  Pulmonary:     Effort: Pulmonary effort is normal. No respiratory distress.     Breath  sounds: Normal breath sounds. No stridor. No wheezing, rhonchi or rales.  Musculoskeletal:        General: Normal range of motion.     Cervical back: Normal range of motion.  Skin:    General: Skin is warm and dry.     Capillary Refill: Capillary refill takes less than 2 seconds.  Neurological:     General: No focal deficit present.     Mental Status: She is alert and oriented to person, place, and time. Mental status is at baseline.  Psychiatric:        Mood and Affect: Mood and affect normal.        Behavior: Behavior normal.        Thought Content: Thought content normal.        Judgment: Judgment normal.    Lab Results  Component Value Date   TSH 1.040 01/04/2022   Lab Results  Component Value Date   WBC 10.6 01/04/2022   HGB 11.3 01/04/2022   HCT 33.0 (L) 01/04/2022   MCV 95 01/04/2022   PLT 310 01/04/2022   Lab Results  Component Value Date   NA 130 (L) 01/04/2022   K 4.3 01/04/2022   CO2 21 01/04/2022   GLUCOSE 97 01/04/2022   BUN 5 (L) 01/04/2022   CREATININE 0.56 (L) 01/04/2022   BILITOT 0.4 01/04/2022   ALKPHOS 227 (H) 01/04/2022   AST 65 (H) 01/04/2022   ALT 30 01/04/2022   PROT 8.0 01/04/2022   ALBUMIN 4.1 01/04/2022   CALCIUM 9.2 01/04/2022   ANIONGAP 6 06/15/2020   EGFR 104 01/04/2022   GFR 100.04 12/01/2020   Lab Results  Component Value Date   CHOL 220 (H) 01/04/2022   Lab Results  Component Value Date   HDL 89 01/04/2022   Lab Results  Component Value Date   LDLCALC 114 (H) 01/04/2022   Lab Results  Component Value Date   TRIG 100 01/04/2022   Lab Results  Component Value Date   CHOLHDL 2.5 01/04/2022   No results found for: "HGBA1C"

## 2022-04-11 ENCOUNTER — Other Ambulatory Visit: Payer: Self-pay | Admitting: Family Medicine

## 2022-04-11 ENCOUNTER — Telehealth: Payer: Self-pay

## 2022-04-11 DIAGNOSIS — E782 Mixed hyperlipidemia: Secondary | ICD-10-CM

## 2022-04-11 LAB — CBC WITH DIFFERENTIAL/PLATELET
Basophils Absolute: 0.2 10*3/uL (ref 0.0–0.2)
Basos: 1 %
EOS (ABSOLUTE): 0.2 10*3/uL (ref 0.0–0.4)
Eos: 2 %
Hematocrit: 31.8 % — ABNORMAL LOW (ref 34.0–46.6)
Hemoglobin: 10.4 g/dL — ABNORMAL LOW (ref 11.1–15.9)
Immature Grans (Abs): 0.1 10*3/uL (ref 0.0–0.1)
Immature Granulocytes: 1 %
Lymphocytes Absolute: 2.4 10*3/uL (ref 0.7–3.1)
Lymphs: 20 %
MCH: 29.9 pg (ref 26.6–33.0)
MCHC: 32.7 g/dL (ref 31.5–35.7)
MCV: 91 fL (ref 79–97)
Monocytes Absolute: 1.1 10*3/uL — ABNORMAL HIGH (ref 0.1–0.9)
Monocytes: 9 %
Neutrophils Absolute: 8.1 10*3/uL — ABNORMAL HIGH (ref 1.4–7.0)
Neutrophils: 67 %
Platelets: 298 10*3/uL (ref 150–450)
RBC: 3.48 x10E6/uL — ABNORMAL LOW (ref 3.77–5.28)
RDW: 13.2 % (ref 11.7–15.4)
WBC: 12 10*3/uL — ABNORMAL HIGH (ref 3.4–10.8)

## 2022-04-11 LAB — CMP14+EGFR
ALT: 31 IU/L (ref 0–32)
AST: 64 IU/L — ABNORMAL HIGH (ref 0–40)
Albumin/Globulin Ratio: 1 — ABNORMAL LOW (ref 1.2–2.2)
Albumin: 4 g/dL (ref 3.9–4.9)
Alkaline Phosphatase: 207 IU/L — ABNORMAL HIGH (ref 44–121)
BUN/Creatinine Ratio: 8 — ABNORMAL LOW (ref 12–28)
BUN: 5 mg/dL — ABNORMAL LOW (ref 8–27)
Bilirubin Total: 0.4 mg/dL (ref 0.0–1.2)
CO2: 23 mmol/L (ref 20–29)
Calcium: 9.6 mg/dL (ref 8.7–10.3)
Chloride: 95 mmol/L — ABNORMAL LOW (ref 96–106)
Creatinine, Ser: 0.66 mg/dL (ref 0.57–1.00)
Globulin, Total: 4 g/dL (ref 1.5–4.5)
Glucose: 109 mg/dL — ABNORMAL HIGH (ref 70–99)
Potassium: 4.4 mmol/L (ref 3.5–5.2)
Sodium: 133 mmol/L — ABNORMAL LOW (ref 134–144)
Total Protein: 8 g/dL (ref 6.0–8.5)
eGFR: 100 mL/min/{1.73_m2} (ref >59)

## 2022-04-11 LAB — LIPID PANEL
Chol/HDL Ratio: 3.3 ratio (ref 0.0–4.4)
Cholesterol, Total: 205 mg/dL — ABNORMAL HIGH (ref 100–199)
HDL: 63 mg/dL (ref >39)
LDL Chol Calc (NIH): 120 mg/dL — ABNORMAL HIGH (ref 0–99)
Triglycerides: 122 mg/dL (ref 0–149)
VLDL Cholesterol Cal: 22 mg/dL (ref 5–40)

## 2022-04-11 MED ORDER — ROSUVASTATIN CALCIUM 5 MG PO TABS: 5.0000 mg | ORAL_TABLET | Freq: Every day | ORAL | 2 refills | Status: DC

## 2022-04-11 NOTE — Telephone Encounter (Signed)
   Telephone encounter was:  Unsuccessful.  04/11/2022 Name: PERL FOLMAR MRN: [id number] DOB: 09-14-1960  Unsuccessful outbound call made today to assist with:  Transportation Needs   Outreach Attempt:  2nd Attempt  Unable to leave message, voicemail full.  [place] Resource Care Guide   ??[email or web address]  ?? [phone]   Website: [email or web address]  [email or web address]  "We don't say no, we SHOW how!"         [facility]

## 2022-04-16 ENCOUNTER — Telehealth: Payer: Self-pay

## 2022-04-16 NOTE — Telephone Encounter (Signed)
   Telephone encounter was:  Unsuccessful.  04/16/2022 Name: Regina Richardson MRN: [id number] DOB: [birth year]  Unsuccessful outbound call made today to assist with:  Transportation Needs   Outreach Attempt:  3rd Attempt.  Referral closed unable to contact patient.  A HIPAA compliant voice message was left requesting a return call.  Instructed patient to call back at Unable to leave message, voicemail does not pickup.   [place] Resource Care Guide   [email or web address]  ?? [phone]   Website: [email or web address]  [email or web address]  "We don't say no, we SHOW how!"         [facility]

## 2022-04-19 ENCOUNTER — Ambulatory Visit (HOSPITAL_COMMUNITY)
Admission: RE | Admit: 2022-04-19 | Discharge: 2022-04-19 | Disposition: A | Discharge status: Home / Self Care | Payer: Medicare HMO | Source: Ambulatory Visit | Attending: Family Medicine | Admitting: Family Medicine

## 2022-04-19 DIAGNOSIS — F10982 Alcohol use, unspecified with alcohol-induced sleep disorder: Secondary | ICD-10-CM | POA: Diagnosis not present

## 2022-04-19 DIAGNOSIS — R112 Nausea with vomiting, unspecified: Secondary | ICD-10-CM | POA: Diagnosis not present

## 2022-04-19 DIAGNOSIS — R160 Hepatomegaly, not elsewhere classified: Secondary | ICD-10-CM | POA: Diagnosis not present

## 2022-04-19 DIAGNOSIS — K8020 Calculus of gallbladder without cholecystitis without obstruction: Secondary | ICD-10-CM | POA: Diagnosis not present

## 2022-04-19 DIAGNOSIS — K7460 Unspecified cirrhosis of liver: Secondary | ICD-10-CM | POA: Diagnosis not present

## 2022-04-20 ENCOUNTER — Other Ambulatory Visit: Payer: Self-pay | Admitting: Family Medicine

## 2022-04-20 DIAGNOSIS — M545 Low back pain, unspecified: Secondary | ICD-10-CM

## 2022-04-20 DIAGNOSIS — M4126 Other idiopathic scoliosis, lumbar region: Secondary | ICD-10-CM

## 2022-04-25 ENCOUNTER — Emergency Department (HOSPITAL_COMMUNITY): Payer: Medicare HMO

## 2022-04-25 ENCOUNTER — Encounter (HOSPITAL_COMMUNITY): Payer: Self-pay

## 2022-04-25 ENCOUNTER — Other Ambulatory Visit: Payer: Self-pay

## 2022-04-25 ENCOUNTER — Inpatient Hospital Stay (HOSPITAL_COMMUNITY)
Admission: EM | Admit: 2022-04-25 | Discharge: 2022-04-28 | DRG: 378 | Disposition: A | Discharge status: Home / Self Care | Payer: Medicare HMO | Attending: Family Medicine | Admitting: Family Medicine

## 2022-04-25 DIAGNOSIS — I10 Essential (primary) hypertension: Secondary | ICD-10-CM | POA: Diagnosis present

## 2022-04-25 DIAGNOSIS — F332 Major depressive disorder, recurrent severe without psychotic features: Secondary | ICD-10-CM

## 2022-04-25 DIAGNOSIS — R1111 Vomiting without nausea: Secondary | ICD-10-CM | POA: Diagnosis not present

## 2022-04-25 DIAGNOSIS — K7030 Alcoholic cirrhosis of liver without ascites: Secondary | ICD-10-CM | POA: Diagnosis present

## 2022-04-25 DIAGNOSIS — K766 Portal hypertension: Secondary | ICD-10-CM | POA: Diagnosis present

## 2022-04-25 DIAGNOSIS — K7460 Unspecified cirrhosis of liver: Secondary | ICD-10-CM

## 2022-04-25 DIAGNOSIS — E871 Hypo-osmolality and hyponatremia: Secondary | ICD-10-CM | POA: Diagnosis present

## 2022-04-25 DIAGNOSIS — Z79899 Other long term (current) drug therapy: Secondary | ICD-10-CM

## 2022-04-25 DIAGNOSIS — I700 Atherosclerosis of aorta: Secondary | ICD-10-CM | POA: Diagnosis present

## 2022-04-25 DIAGNOSIS — E440 Moderate protein-calorie malnutrition: Secondary | ICD-10-CM | POA: Diagnosis present

## 2022-04-25 DIAGNOSIS — F1010 Alcohol abuse, uncomplicated: Secondary | ICD-10-CM | POA: Diagnosis present

## 2022-04-25 DIAGNOSIS — R7401 Elevation of levels of liver transaminase levels: Secondary | ICD-10-CM | POA: Diagnosis not present

## 2022-04-25 DIAGNOSIS — Z8541 Personal history of malignant neoplasm of cervix uteri: Secondary | ICD-10-CM

## 2022-04-25 DIAGNOSIS — R140 Abdominal distension (gaseous): Secondary | ICD-10-CM | POA: Diagnosis not present

## 2022-04-25 DIAGNOSIS — R1084 Generalized abdominal pain: Secondary | ICD-10-CM | POA: Diagnosis not present

## 2022-04-25 DIAGNOSIS — M810 Age-related osteoporosis without current pathological fracture: Secondary | ICD-10-CM | POA: Diagnosis present

## 2022-04-25 DIAGNOSIS — D72829 Elevated white blood cell count, unspecified: Secondary | ICD-10-CM | POA: Diagnosis present

## 2022-04-25 DIAGNOSIS — E782 Mixed hyperlipidemia: Secondary | ICD-10-CM | POA: Diagnosis present

## 2022-04-25 DIAGNOSIS — R7989 Other specified abnormal findings of blood chemistry: Secondary | ICD-10-CM | POA: Diagnosis not present

## 2022-04-25 DIAGNOSIS — F419 Anxiety disorder, unspecified: Secondary | ICD-10-CM | POA: Diagnosis present

## 2022-04-25 DIAGNOSIS — Z8501 Personal history of malignant neoplasm of esophagus: Secondary | ICD-10-CM

## 2022-04-25 DIAGNOSIS — Z716 Tobacco abuse counseling: Secondary | ICD-10-CM

## 2022-04-25 DIAGNOSIS — Z79891 Long term (current) use of opiate analgesic: Secondary | ICD-10-CM

## 2022-04-25 DIAGNOSIS — R160 Hepatomegaly, not elsewhere classified: Secondary | ICD-10-CM | POA: Diagnosis present

## 2022-04-25 DIAGNOSIS — M1990 Unspecified osteoarthritis, unspecified site: Secondary | ICD-10-CM | POA: Diagnosis present

## 2022-04-25 DIAGNOSIS — E669 Obesity, unspecified: Secondary | ICD-10-CM | POA: Diagnosis present

## 2022-04-25 DIAGNOSIS — F32A Depression, unspecified: Secondary | ICD-10-CM | POA: Diagnosis present

## 2022-04-25 DIAGNOSIS — K2980 Duodenitis without bleeding: Secondary | ICD-10-CM | POA: Diagnosis present

## 2022-04-25 DIAGNOSIS — K260 Acute duodenal ulcer with hemorrhage: Secondary | ICD-10-CM | POA: Diagnosis present

## 2022-04-25 DIAGNOSIS — R1013 Epigastric pain: Secondary | ICD-10-CM | POA: Diagnosis not present

## 2022-04-25 DIAGNOSIS — D649 Anemia, unspecified: Secondary | ICD-10-CM | POA: Diagnosis present

## 2022-04-25 DIAGNOSIS — E876 Hypokalemia: Secondary | ICD-10-CM | POA: Diagnosis present

## 2022-04-25 DIAGNOSIS — R197 Diarrhea, unspecified: Secondary | ICD-10-CM | POA: Diagnosis not present

## 2022-04-25 DIAGNOSIS — K319 Disease of stomach and duodenum, unspecified: Secondary | ICD-10-CM | POA: Diagnosis not present

## 2022-04-25 DIAGNOSIS — C159 Malignant neoplasm of esophagus, unspecified: Secondary | ICD-10-CM | POA: Diagnosis not present

## 2022-04-25 DIAGNOSIS — K2289 Other specified disease of esophagus: Secondary | ICD-10-CM | POA: Diagnosis not present

## 2022-04-25 DIAGNOSIS — D3502 Benign neoplasm of left adrenal gland: Secondary | ICD-10-CM | POA: Diagnosis present

## 2022-04-25 DIAGNOSIS — Z8249 Family history of ischemic heart disease and other diseases of the circulatory system: Secondary | ICD-10-CM

## 2022-04-25 DIAGNOSIS — R112 Nausea with vomiting, unspecified: Secondary | ICD-10-CM | POA: Diagnosis not present

## 2022-04-25 DIAGNOSIS — E46 Unspecified protein-calorie malnutrition: Secondary | ICD-10-CM | POA: Diagnosis not present

## 2022-04-25 DIAGNOSIS — R109 Unspecified abdominal pain: Secondary | ICD-10-CM | POA: Diagnosis present

## 2022-04-25 DIAGNOSIS — E162 Hypoglycemia, unspecified: Secondary | ICD-10-CM | POA: Diagnosis not present

## 2022-04-25 DIAGNOSIS — E8809 Other disorders of plasma-protein metabolism, not elsewhere classified: Secondary | ICD-10-CM | POA: Diagnosis present

## 2022-04-25 DIAGNOSIS — K269 Duodenal ulcer, unspecified as acute or chronic, without hemorrhage or perforation: Secondary | ICD-10-CM | POA: Diagnosis not present

## 2022-04-25 DIAGNOSIS — F17200 Nicotine dependence, unspecified, uncomplicated: Secondary | ICD-10-CM | POA: Diagnosis present

## 2022-04-25 DIAGNOSIS — R110 Nausea: Secondary | ICD-10-CM | POA: Diagnosis not present

## 2022-04-25 DIAGNOSIS — Z6831 Body mass index (BMI) 31.0-31.9, adult: Secondary | ICD-10-CM

## 2022-04-25 DIAGNOSIS — Z811 Family history of alcohol abuse and dependence: Secondary | ICD-10-CM

## 2022-04-25 DIAGNOSIS — F17210 Nicotine dependence, cigarettes, uncomplicated: Secondary | ICD-10-CM | POA: Diagnosis present

## 2022-04-25 DIAGNOSIS — K219 Gastro-esophageal reflux disease without esophagitis: Secondary | ICD-10-CM | POA: Diagnosis present

## 2022-04-25 DIAGNOSIS — R933 Abnormal findings on diagnostic imaging of other parts of digestive tract: Secondary | ICD-10-CM | POA: Diagnosis not present

## 2022-04-25 DIAGNOSIS — Z90710 Acquired absence of both cervix and uterus: Secondary | ICD-10-CM

## 2022-04-25 DIAGNOSIS — Z6830 Body mass index (BMI) 30.0-30.9, adult: Secondary | ICD-10-CM

## 2022-04-25 DIAGNOSIS — F418 Other specified anxiety disorders: Secondary | ICD-10-CM | POA: Diagnosis not present

## 2022-04-25 LAB — CBC WITH DIFFERENTIAL/PLATELET
Abs Immature Granulocytes: 0.14 10*3/uL — ABNORMAL HIGH (ref 0.00–0.07)
Basophils Absolute: 0.1 10*3/uL (ref 0.0–0.1)
Basophils Relative: 1 %
Eosinophils Absolute: 0 10*3/uL (ref 0.0–0.5)
Eosinophils Relative: 0 %
HCT: 30.4 % — ABNORMAL LOW (ref 36.0–46.0)
Hemoglobin: 10 g/dL — ABNORMAL LOW (ref 12.0–15.0)
Immature Granulocytes: 1 %
Lymphocytes Relative: 9 %
Lymphs Abs: 1.6 10*3/uL (ref 0.7–4.0)
MCH: 30.3 pg (ref 26.0–34.0)
MCHC: 32.9 g/dL (ref 30.0–36.0)
MCV: 92.1 fL (ref 80.0–100.0)
Monocytes Absolute: 1.5 10*3/uL — ABNORMAL HIGH (ref 0.1–1.0)
Monocytes Relative: 8 %
Neutro Abs: 14.5 10*3/uL — ABNORMAL HIGH (ref 1.7–7.7)
Neutrophils Relative %: 81 %
Platelets: 330 10*3/uL (ref 150–400)
RBC: 3.3 MIL/uL — ABNORMAL LOW (ref 3.87–5.11)
RDW: 15.2 % (ref 11.5–15.5)
WBC: 17.9 10*3/uL — ABNORMAL HIGH (ref 4.0–10.5)
nRBC: 0 % (ref 0.0–0.2)

## 2022-04-25 LAB — COMPREHENSIVE METABOLIC PANEL
ALT: 52 U/L — ABNORMAL HIGH (ref 0–44)
AST: 59 U/L — ABNORMAL HIGH (ref 15–41)
Albumin: 2.8 g/dL — ABNORMAL LOW (ref 3.5–5.0)
Alkaline Phosphatase: 168 U/L — ABNORMAL HIGH (ref 38–126)
Anion gap: 10 (ref 5–15)
BUN: 13 mg/dL (ref 8–23)
CO2: 24 mmol/L (ref 22–32)
Calcium: 8.4 mg/dL — ABNORMAL LOW (ref 8.9–10.3)
Chloride: 95 mmol/L — ABNORMAL LOW (ref 98–111)
Creatinine, Ser: 0.47 mg/dL (ref 0.44–1.00)
GFR, Estimated: >60 mL/min (ref >60)
Glucose, Bld: 105 mg/dL — ABNORMAL HIGH (ref 70–99)
Potassium: 3.1 mmol/L — ABNORMAL LOW (ref 3.5–5.1)
Sodium: 129 mmol/L — ABNORMAL LOW (ref 135–145)
Total Bilirubin: 1.1 mg/dL (ref 0.3–1.2)
Total Protein: 7.1 g/dL (ref 6.5–8.1)

## 2022-04-25 LAB — LIPASE, BLOOD: Lipase: 30 U/L (ref 11–51)

## 2022-04-25 MED ORDER — SODIUM CHLORIDE 0.9 % IV BOLUS
500.0000 mL | Freq: Once | INTRAVENOUS | Status: AC
  Administered 2022-04-25: 500 mL via INTRAVENOUS

## 2022-04-25 MED ORDER — ONDANSETRON HCL 4 MG/2ML IJ SOLN
4.0000 mg | Freq: Once | INTRAMUSCULAR | Status: AC
  Administered 2022-04-25: 4 mg via INTRAVENOUS
  Filled 2022-04-25: qty 2

## 2022-04-25 MED ORDER — IOHEXOL 300 MG/ML  SOLN
100.0000 mL | Freq: Once | INTRAMUSCULAR | Status: AC | PRN
Start: 2022-04-25 — End: 2022-04-25
  Administered 2022-04-25: 100 mL via INTRAVENOUS

## 2022-04-25 NOTE — ED Provider Notes (Signed)
[facility] EMERGENCY DEPARTMENT Provider Note   CSN: [id number] Arrival date & time: 04/25/22  2119     History  Chief Complaint  Patient presents with   Abdominal Pain    Regina Richardson is a 61 y.o. female with history of alcoholic cirrhosis, hepatomegaly, GERD presents to the ED today complaining of abdominal pain, nausea, vomiting, diarrhea since last Wednesday.  States her symptoms were sudden onset.  She also reports generalized abdominal pain.  Patient had ultrasound on Thursday that showed findings consistent with history of cirrhosis and mild cholelithiasis and sludge.  Denies melena, hematochezia, hematemesis, fever, chills, alcohol or drug use.  Patient has been unable to keep solids or liquids down today.  Reports no sick contacts.     Home Medications Prior to Admission medications   Medication Sig Start Date End Date Taking? Authorizing Provider  allopurinol (ZYLOPRIM) 300 MG tablet Take 1 tablet (300 mg total) by mouth daily. 11/15/21   Geiman Brooklyn, FNP  buPROPion [facility] SR) 150 MG 12 hr tablet TAKE 1 TABLET BY MOUTH TWICE A DAY 02/26/22   Hendricks Limes F, FNP  diclofenac (VOLTAREN) 75 MG EC tablet TAKE 1 TABLET BY MOUTH TWICE A DAY AS NEEDED 04/20/22   [name], MD  methocarbamol (ROBAXIN) 500 MG tablet Take 1 tablet (500 mg total) by mouth every 8 (eight) hours as needed for muscle spasms. 11/15/21   Maynes Brooklyn, FNP  metoprolol succinate (TOPROL-XL) 50 MG 24 hr tablet TAKE 1 TABLET BY MOUTH EVERY DAY 03/15/22   Delahunt Brooklyn, FNP  ondansetron (ZOFRAN-ODT) 4 MG disintegrating tablet Take 1 tablet (4 mg total) by mouth every 8 (eight) hours as needed for nausea or vomiting. 04/10/22   Gieger Brooklyn, FNP  pantoprazole (PROTONIX) 40 MG tablet Take 1 tablet (40 mg total) by mouth daily before breakfast. 11/15/21 05/14/22  Froelich Brooklyn, FNP  rosuvastatin (CRESTOR) 5 MG tablet Take 1 tablet (5 mg total) by mouth daily. 04/11/22   Beidler Brooklyn, FNP   traZODone (DESYREL) 100 MG tablet Take 2 tablets (200 mg total) by mouth at bedtime. 12/08/21   Mucci Brooklyn, FNP  Vilazodone HCl 20 MG TABS Take 1 tablet (20 mg total) by mouth daily. 11/15/21   Cabezas Brooklyn, FNP      Allergies    Patient has no known allergies.    Review of Systems   Review of Systems  Constitutional:  Negative for chills and fever.  Gastrointestinal:  Positive for abdominal pain, diarrhea, nausea and vomiting.    Physical Exam Updated Vital Signs BP (!) 171/71   Pulse 69   Temp 98.7 F (37.1 C)   Resp 16   Ht 5' (1.524 m)   Wt 72.6 kg   SpO2 96%   BMI 31.25 kg/m  Physical Exam Vitals and nursing note reviewed.  Constitutional:      General: She is not in acute distress.    Appearance: She is obese. She is not ill-appearing.  HENT:     Mouth/Throat:     Mouth: Mucous membranes are moist.     Pharynx: Oropharynx is clear.  Cardiovascular:     Rate and Rhythm: Normal rate and regular rhythm.     Pulses: Normal pulses.     Heart sounds: Normal heart sounds.  Pulmonary:     Effort: Pulmonary effort is normal. No respiratory distress.     Breath sounds: Normal breath sounds.  Abdominal:  General: Abdomen is flat. Bowel sounds are normal. There is no distension.     Palpations: Abdomen is soft. There is no mass.     Tenderness: There is generalized abdominal tenderness and tenderness in the epigastric area. There is no guarding. Negative signs include Murphy's sign.  Skin:    General: Skin is warm and dry.     Capillary Refill: Capillary refill takes less than 2 seconds.  Neurological:     Mental Status: She is alert. Mental status is at baseline.  Psychiatric:        Mood and Affect: Mood normal.        Behavior: Behavior normal.     ED Results / Procedures / Treatments   Labs (all labs ordered are listed, but only abnormal results are displayed) Labs Reviewed  CBC WITH DIFFERENTIAL/PLATELET - Abnormal; Notable for the following  components:      Result Value   WBC 17.9 (*)    RBC 3.30 (*)    Hemoglobin 10.0 (*)    HCT 30.4 (*)    Neutro Abs 14.5 (*)    Monocytes Absolute 1.5 (*)    Abs Immature Granulocytes 0.14 (*)    All other components within normal limits  COMPREHENSIVE METABOLIC PANEL  LIPASE, BLOOD  URINALYSIS, ROUTINE W REFLEX MICROSCOPIC    EKG None  Radiology No results found.  Procedures Procedures    Medications Ordered in ED Medications  ondansetron (ZOFRAN) injection 4 mg (4 mg Intravenous Given 04/25/22 2233)  sodium chloride 0.9 % bolus 500 mL (500 mLs Intravenous New Bag/Given 04/25/22 2235)    ED Course/ Medical Decision Making/ A&P                           Medical Decision Making Amount and/or Complexity of Data Reviewed Labs: ordered.  Risk Prescription drug management.   This patient presents to the ED for concern of abdominal pain with nausea and vomiting, this involves an extensive number of treatment options, and is a complaint that carries with it a high risk of complications and morbidity.  The differential diagnosis includes cirrhosis, cholecystitis, pancreatitis, gastritis.   Co morbidities that complicate the patient evaluation  Alcoholic cirrhosis, hepatomegaly   Additional history obtained:  External records from outside source obtained and reviewed including US abdomen from 04/19/2022.   Lab Tests:  I Ordered, and personally interpreted labs.  The pertinent results include: WBC 17.9.    Imaging Studies ordered:  I ordered imaging studies including CT abdomen pelvis with contrast.     Problem List / ED Course / Critical interventions / Medication management  Abdominal pain, n/v/d, leukocytosis  I ordered medication including NS bolus and zofran  for n/v.  Reevaluation of the patient after these medicines showed that the patient stayed the same  Discussed assessment and plan with Dr. Sabra Heck who agreed.  Patient has longstanding history of  alcoholic cirrhosis and hepatic disease.   11:29 PM Care of '@PATIENTNAME'$ @ transferred to and Dr. Betsey Holiday at the end of my shift as the patient will require reassessment once labs/imaging have resulted. Patient presentation, ED course, and plan of care discussed with review of all pertinent labs and imaging. Please see his/her note for further details regarding further ED course and disposition. Plan at time of handoff is CT abdomen pelvis and likely admit to hospital. This may be altered or completely changed at the discretion of the oncoming team pending results of further workup.  Final Clinical Impression(s) / ED Diagnoses Final diagnoses:  None    Rx / DC Orders ED Discharge Orders     None         Pat Kocher, [place] 04/25/22 2329    Orpah Greek, MD 04/26/22 [phone]

## 2022-04-25 NOTE — ED Triage Notes (Signed)
Pt via [facility] complaining of abdominal pain since last Thursday. Pt has had trouble eating and drinking and endorses N/V. Pt abdomen is tender to the touch, tender and she reports pain all across.   IV 20 in [facility]

## 2022-04-25 NOTE — ED Notes (Signed)
Patient transported to CT 

## 2022-04-26 ENCOUNTER — Encounter (HOSPITAL_COMMUNITY): Payer: Self-pay | Admitting: Internal Medicine

## 2022-04-26 DIAGNOSIS — D72829 Elevated white blood cell count, unspecified: Secondary | ICD-10-CM | POA: Diagnosis present

## 2022-04-26 DIAGNOSIS — R7989 Other specified abnormal findings of blood chemistry: Secondary | ICD-10-CM | POA: Diagnosis not present

## 2022-04-26 DIAGNOSIS — R160 Hepatomegaly, not elsewhere classified: Secondary | ICD-10-CM | POA: Diagnosis present

## 2022-04-26 DIAGNOSIS — R112 Nausea with vomiting, unspecified: Secondary | ICD-10-CM | POA: Diagnosis not present

## 2022-04-26 DIAGNOSIS — E782 Mixed hyperlipidemia: Secondary | ICD-10-CM | POA: Diagnosis present

## 2022-04-26 DIAGNOSIS — E440 Moderate protein-calorie malnutrition: Secondary | ICD-10-CM | POA: Diagnosis present

## 2022-04-26 DIAGNOSIS — E8809 Other disorders of plasma-protein metabolism, not elsewhere classified: Secondary | ICD-10-CM | POA: Diagnosis present

## 2022-04-26 DIAGNOSIS — F1010 Alcohol abuse, uncomplicated: Secondary | ICD-10-CM | POA: Diagnosis present

## 2022-04-26 DIAGNOSIS — R1013 Epigastric pain: Secondary | ICD-10-CM | POA: Diagnosis not present

## 2022-04-26 DIAGNOSIS — K260 Acute duodenal ulcer with hemorrhage: Secondary | ICD-10-CM | POA: Diagnosis not present

## 2022-04-26 DIAGNOSIS — M810 Age-related osteoporosis without current pathological fracture: Secondary | ICD-10-CM | POA: Diagnosis present

## 2022-04-26 DIAGNOSIS — E46 Unspecified protein-calorie malnutrition: Secondary | ICD-10-CM

## 2022-04-26 DIAGNOSIS — F419 Anxiety disorder, unspecified: Secondary | ICD-10-CM | POA: Diagnosis present

## 2022-04-26 DIAGNOSIS — D649 Anemia, unspecified: Secondary | ICD-10-CM | POA: Diagnosis present

## 2022-04-26 DIAGNOSIS — R7401 Elevation of levels of liver transaminase levels: Secondary | ICD-10-CM | POA: Diagnosis not present

## 2022-04-26 DIAGNOSIS — K2289 Other specified disease of esophagus: Secondary | ICD-10-CM | POA: Diagnosis not present

## 2022-04-26 DIAGNOSIS — M1990 Unspecified osteoarthritis, unspecified site: Secondary | ICD-10-CM | POA: Diagnosis present

## 2022-04-26 DIAGNOSIS — D3502 Benign neoplasm of left adrenal gland: Secondary | ICD-10-CM | POA: Diagnosis present

## 2022-04-26 DIAGNOSIS — R109 Unspecified abdominal pain: Principal | ICD-10-CM | POA: Diagnosis present

## 2022-04-26 DIAGNOSIS — F418 Other specified anxiety disorders: Secondary | ICD-10-CM | POA: Diagnosis not present

## 2022-04-26 DIAGNOSIS — E669 Obesity, unspecified: Secondary | ICD-10-CM | POA: Diagnosis present

## 2022-04-26 DIAGNOSIS — E871 Hypo-osmolality and hyponatremia: Secondary | ICD-10-CM | POA: Diagnosis present

## 2022-04-26 DIAGNOSIS — K269 Duodenal ulcer, unspecified as acute or chronic, without hemorrhage or perforation: Secondary | ICD-10-CM | POA: Diagnosis not present

## 2022-04-26 DIAGNOSIS — I10 Essential (primary) hypertension: Secondary | ICD-10-CM

## 2022-04-26 DIAGNOSIS — K7030 Alcoholic cirrhosis of liver without ascites: Secondary | ICD-10-CM

## 2022-04-26 DIAGNOSIS — C159 Malignant neoplasm of esophagus, unspecified: Secondary | ICD-10-CM | POA: Diagnosis not present

## 2022-04-26 DIAGNOSIS — R933 Abnormal findings on diagnostic imaging of other parts of digestive tract: Secondary | ICD-10-CM | POA: Diagnosis not present

## 2022-04-26 DIAGNOSIS — K2980 Duodenitis without bleeding: Secondary | ICD-10-CM | POA: Diagnosis present

## 2022-04-26 DIAGNOSIS — E876 Hypokalemia: Secondary | ICD-10-CM

## 2022-04-26 DIAGNOSIS — R1084 Generalized abdominal pain: Secondary | ICD-10-CM | POA: Diagnosis not present

## 2022-04-26 DIAGNOSIS — I700 Atherosclerosis of aorta: Secondary | ICD-10-CM | POA: Diagnosis present

## 2022-04-26 DIAGNOSIS — K219 Gastro-esophageal reflux disease without esophagitis: Secondary | ICD-10-CM

## 2022-04-26 DIAGNOSIS — F17200 Nicotine dependence, unspecified, uncomplicated: Secondary | ICD-10-CM

## 2022-04-26 DIAGNOSIS — F17210 Nicotine dependence, cigarettes, uncomplicated: Secondary | ICD-10-CM | POA: Diagnosis present

## 2022-04-26 DIAGNOSIS — F32A Depression, unspecified: Secondary | ICD-10-CM | POA: Diagnosis present

## 2022-04-26 DIAGNOSIS — K766 Portal hypertension: Secondary | ICD-10-CM | POA: Diagnosis present

## 2022-04-26 LAB — CBC
HCT: 30.1 % — ABNORMAL LOW (ref 36.0–46.0)
Hemoglobin: 9.9 g/dL — ABNORMAL LOW (ref 12.0–15.0)
MCH: 30.7 pg (ref 26.0–34.0)
MCHC: 32.9 g/dL (ref 30.0–36.0)
MCV: 93.2 fL (ref 80.0–100.0)
Platelets: 296 10*3/uL (ref 150–400)
RBC: 3.23 MIL/uL — ABNORMAL LOW (ref 3.87–5.11)
RDW: 15.1 % (ref 11.5–15.5)
WBC: 15.9 10*3/uL — ABNORMAL HIGH (ref 4.0–10.5)
nRBC: 0 % (ref 0.0–0.2)

## 2022-04-26 LAB — LIPID PANEL
Cholesterol: 117 mg/dL (ref 0–200)
HDL: 30 mg/dL — ABNORMAL LOW (ref >40)
LDL Cholesterol: 70 mg/dL (ref 0–99)
Total CHOL/HDL Ratio: 3.9 RATIO
Triglycerides: 85 mg/dL (ref <150)
VLDL: 17 mg/dL (ref 0–40)

## 2022-04-26 LAB — URINALYSIS, ROUTINE W REFLEX MICROSCOPIC
Bilirubin Urine: NEGATIVE
Glucose, UA: NEGATIVE mg/dL
Ketones, ur: NEGATIVE mg/dL
Leukocytes,Ua: NEGATIVE
Nitrite: NEGATIVE
Protein, ur: NEGATIVE mg/dL
Specific Gravity, Urine: >1.046 — ABNORMAL HIGH (ref 1.005–1.030)
pH: 7 (ref 5.0–8.0)

## 2022-04-26 LAB — HEPATITIS PANEL, ACUTE
HCV Ab: NONREACTIVE
Hep A IgM: NONREACTIVE
Hep B C IgM: NONREACTIVE
Hepatitis B Surface Ag: NONREACTIVE

## 2022-04-26 LAB — COMPREHENSIVE METABOLIC PANEL
ALT: 55 U/L — ABNORMAL HIGH (ref 0–44)
AST: 58 U/L — ABNORMAL HIGH (ref 15–41)
Albumin: 2.7 g/dL — ABNORMAL LOW (ref 3.5–5.0)
Alkaline Phosphatase: 171 U/L — ABNORMAL HIGH (ref 38–126)
Anion gap: 9 (ref 5–15)
BUN: 13 mg/dL (ref 8–23)
CO2: 24 mmol/L (ref 22–32)
Calcium: 8.2 mg/dL — ABNORMAL LOW (ref 8.9–10.3)
Chloride: 97 mmol/L — ABNORMAL LOW (ref 98–111)
Creatinine, Ser: 0.48 mg/dL (ref 0.44–1.00)
GFR, Estimated: >60 mL/min (ref >60)
Glucose, Bld: 85 mg/dL (ref 70–99)
Potassium: 3.2 mmol/L — ABNORMAL LOW (ref 3.5–5.1)
Sodium: 130 mmol/L — ABNORMAL LOW (ref 135–145)
Total Bilirubin: 0.9 mg/dL (ref 0.3–1.2)
Total Protein: 6.8 g/dL (ref 6.5–8.1)

## 2022-04-26 LAB — OSMOLALITY: Osmolality: 269 mOsm/kg — ABNORMAL LOW (ref 275–295)

## 2022-04-26 LAB — BASIC METABOLIC PANEL
Anion gap: 8 (ref 5–15)
BUN: 10 mg/dL (ref 8–23)
CO2: 22 mmol/L (ref 22–32)
Calcium: 8.1 mg/dL — ABNORMAL LOW (ref 8.9–10.3)
Chloride: 99 mmol/L (ref 98–111)
Creatinine, Ser: 0.5 mg/dL (ref 0.44–1.00)
GFR, Estimated: >60 mL/min (ref >60)
Glucose, Bld: 160 mg/dL — ABNORMAL HIGH (ref 70–99)
Potassium: 2.9 mmol/L — ABNORMAL LOW (ref 3.5–5.1)
Sodium: 129 mmol/L — ABNORMAL LOW (ref 135–145)

## 2022-04-26 LAB — HIV ANTIBODY (ROUTINE TESTING W REFLEX): HIV Screen 4th Generation wRfx: NONREACTIVE

## 2022-04-26 LAB — FOLATE: Folate: 11.4 ng/mL (ref >5.9)

## 2022-04-26 LAB — MAGNESIUM: Magnesium: 1.5 mg/dL — ABNORMAL LOW (ref 1.7–2.4)

## 2022-04-26 MED ORDER — LORAZEPAM 2 MG/ML IJ SOLN: 1.0000 mg | INTRAMUSCULAR | Status: DC | PRN

## 2022-04-26 MED ORDER — ONDANSETRON HCL 4 MG/2ML IJ SOLN: 4.0000 mg | Freq: Four times a day (QID) | INTRAMUSCULAR | Status: DC | PRN

## 2022-04-26 MED ORDER — FOLIC ACID 1 MG PO TABS
1.0000 mg | ORAL_TABLET | Freq: Every day | ORAL | Status: DC
  Administered 2022-04-26 – 2022-04-28 (×3): 1 mg via ORAL
  Filled 2022-04-26 (×4): qty 1

## 2022-04-26 MED ORDER — ENOXAPARIN SODIUM 40 MG/0.4ML IJ SOSY
40.0000 mg | PREFILLED_SYRINGE | INTRAMUSCULAR | Status: DC
  Administered 2022-04-26: 40 mg via SUBCUTANEOUS
  Filled 2022-04-26: qty 0.4

## 2022-04-26 MED ORDER — HYDRALAZINE HCL 20 MG/ML IJ SOLN: 10.0000 mg | Freq: Four times a day (QID) | INTRAMUSCULAR | Status: DC | PRN

## 2022-04-26 MED ORDER — POTASSIUM CHLORIDE 10 MEQ/100ML IV SOLN
10.0000 meq | INTRAVENOUS | Status: AC
  Administered 2022-04-26 (×3): 10 meq via INTRAVENOUS
  Filled 2022-04-26 (×3): qty 100

## 2022-04-26 MED ORDER — THIAMINE HCL 100 MG/ML IJ SOLN
100.0000 mg | Freq: Every day | INTRAMUSCULAR | Status: DC
  Filled 2022-04-26: qty 2

## 2022-04-26 MED ORDER — POTASSIUM CHLORIDE CRYS ER 20 MEQ PO TBCR
40.0000 meq | EXTENDED_RELEASE_TABLET | Freq: Once | ORAL | Status: AC
  Administered 2022-04-26: 40 meq via ORAL
  Filled 2022-04-26: qty 2

## 2022-04-26 MED ORDER — MORPHINE SULFATE (PF) 2 MG/ML IV SOLN: 2.0000 mg | INTRAVENOUS | Status: DC | PRN

## 2022-04-26 MED ORDER — DIAZEPAM 2 MG PO TABS
2.0000 mg | ORAL_TABLET | Freq: Three times a day (TID) | ORAL | Status: AC
  Administered 2022-04-26 – 2022-04-27 (×6): 2 mg via ORAL
  Filled 2022-04-26 (×6): qty 1

## 2022-04-26 MED ORDER — ENSURE ENLIVE PO LIQD
237.0000 mL | Freq: Two times a day (BID) | ORAL | Status: DC
  Administered 2022-04-27 – 2022-04-28 (×4): 237 mL via ORAL

## 2022-04-26 MED ORDER — METOPROLOL SUCCINATE ER 50 MG PO TB24
50.0000 mg | ORAL_TABLET | Freq: Every day | ORAL | Status: DC
  Administered 2022-04-26 – 2022-04-28 (×3): 50 mg via ORAL
  Filled 2022-04-26 (×4): qty 1

## 2022-04-26 MED ORDER — PANTOPRAZOLE 80MG IVPB - SIMPLE MED
80.0000 mg | Freq: Once | INTRAVENOUS | Status: AC
  Administered 2022-04-26: 80 mg via INTRAVENOUS
  Filled 2022-04-26: qty 100

## 2022-04-26 MED ORDER — THIAMINE MONONITRATE 100 MG PO TABS
100.0000 mg | ORAL_TABLET | Freq: Every day | ORAL | Status: DC
  Administered 2022-04-26 – 2022-04-28 (×3): 100 mg via ORAL
  Filled 2022-04-26 (×4): qty 1

## 2022-04-26 MED ORDER — FENTANYL CITRATE PF 50 MCG/ML IJ SOSY: 50.0000 ug | PREFILLED_SYRINGE | INTRAMUSCULAR | Status: DC | PRN

## 2022-04-26 MED ORDER — PANTOPRAZOLE INFUSION (NEW) - SIMPLE MED
8.0000 mg/h | INTRAVENOUS | Status: DC
  Administered 2022-04-26 – 2022-04-28 (×6): 8 mg/h via INTRAVENOUS
  Filled 2022-04-26: qty 80
  Filled 2022-04-26: qty 100
  Filled 2022-04-26 (×3): qty 80
  Filled 2022-04-26: qty 100
  Filled 2022-04-26: qty 80
  Filled 2022-04-26: qty 100

## 2022-04-26 MED ORDER — LORAZEPAM 1 MG PO TABS: 1.0000 mg | ORAL_TABLET | ORAL | Status: DC | PRN

## 2022-04-26 MED ORDER — HYDROMORPHONE HCL 1 MG/ML IJ SOLN
1.0000 mg | Freq: Once | INTRAMUSCULAR | Status: AC
  Administered 2022-04-26: 1 mg via INTRAVENOUS
  Filled 2022-04-26: qty 1

## 2022-04-26 MED ORDER — SODIUM CHLORIDE 0.9 % IV SOLN: INTRAVENOUS | Status: AC

## 2022-04-26 MED ORDER — ENOXAPARIN SODIUM 40 MG/0.4ML IJ SOSY
40.0000 mg | PREFILLED_SYRINGE | INTRAMUSCULAR | Status: DC
  Administered 2022-04-28: 40 mg via SUBCUTANEOUS
  Filled 2022-04-26: qty 0.4

## 2022-04-26 MED ORDER — ADULT MULTIVITAMIN W/MINERALS CH
1.0000 | ORAL_TABLET | Freq: Every day | ORAL | Status: DC
  Administered 2022-04-26 – 2022-04-28 (×3): 1 via ORAL
  Filled 2022-04-26 (×4): qty 1

## 2022-04-26 MED ORDER — PANTOPRAZOLE SODIUM 40 MG IV SOLR: 40.0000 mg | Freq: Two times a day (BID) | INTRAVENOUS | Status: DC

## 2022-04-26 MED ORDER — MAGNESIUM SULFATE 4 GM/100ML IV SOLN
4.0000 g | Freq: Once | INTRAVENOUS | Status: AC
  Administered 2022-04-26: 4 g via INTRAVENOUS
  Filled 2022-04-26: qty 100

## 2022-04-26 NOTE — H&P (Signed)
History and Physical    Patient: Regina Richardson MBW:[id number] DOB: 02/04/1961 DOA: 04/25/2022 DOS: the patient was seen and examined on 04/26/2022 PCP: Stepka Brooklyn, FNP  Patient coming from: Home  Chief Complaint:  Chief Complaint  Patient presents with   Abdominal Pain   HPI: Regina Richardson is a 61 y.o. female with medical history significant of essential hypertension, GERD, alcoholic cirrhosis, tobacco abuse who presents emergency department due to 1 week onset of abdominal pain, nausea, vomiting.  Abdominal pain was generalized and she has been having difficulty in being able to keep anything down.  Ultrasound done on Thursday (9/28) was consistent with cirrhosis and mild cholelithiasis and sludge.  She endorsed stopping alcohol about 2 weeks ago, she denies chest pain, shortness of breath, fever, chills, blood in stool, vomiting of blood.  ED Course:  In the emergency department, she was hemodynamically stable, BP was 160/70 and other vital signs are within normal range.  Work-up in the ED showed leukocytosis and normocytic anemia.  BMP showed hyponatremia, hypokalemia, transaminitis, albumin 2.8.  Lipase 30, urinalysis was unimpressive for UTI. CT abdomen and pelvis with contrast showed: 1. Moderate severity duodenitis. 2. Hepatic cirrhosis. 3. Stable, benign, left adrenal adenoma. No additional follow-up imaging is recommended 4.  Aortic atherosclerosis Patient was treated with Dilaudid, Zofran, Protonix and IV hydration was provided.  Hospitalist was asked to admit patient for further evaluation and management.  Review of Systems: Review of systems as noted in the HPI. All other systems reviewed and are negative.   Past Medical History:  Diagnosis Date   Alcoholism ([place])    Anxiety    Arrhythmia    heart   Arthritis    Cancer ([place])    cervical cancer in the 80s   Cirrhosis ([place]) 07/7791   alcoholic;    Depression    Dysrhythmia    GERD (gastroesophageal reflux  disease)    Hypertension    Osteoporosis    Scoliosis    Urinary incontinence    Past Surgical History:  Procedure Laterality Date   BIOPSY  10/11/2020   Procedure: BIOPSY;  Surgeon: Eloise Harman, DO;  [facility];  Service: Endoscopy;;   BIOPSY  12/12/2020   Procedure: BIOPSY;  Surgeon: Irving Copas., MD;  Location: [facility];  Service: Gastroenterology;;   CERVICAL BIOPSY  W/ LOOP ELECTRODE EXCISION     CESAREAN SECTION     COLONOSCOPY  08/2017   Dr. Fuller Plan;  prominent and moderately lipomatous ileocecal valve, 9 mm sessile polyp in hepatic flexure, 8 mm sessile polyp in the descending colon, otherwise normal exam.  Pathology with sessile serrated polyp and hyperplastic polyp.  Recommended repeat colonoscopy in 5 years.   COLONOSCOPY N/A 06/15/2020   Procedure: COLONOSCOPY;  Surgeon: Rogene Houston, MD; 2 polyps resected and retrieved, 1 small polyp in the distal sigmoid biopsy, external hemorrhoids.  Pathology with 1 tubular adenoma and 1 hyperplastic polyp.    COLPOSCOPY     EGD with mucosal resection  03/16/2021   [facility]: 2 large plaques in the proximal-mid third of the esophagus s/p endoscopic submucosal resection and retrieval, placement of 4 MR conditional clips.  Normal stomach, normal examined duodenum.  Pathology revealed squamous cell carcinoma in situ focally extending to the peripheral inked margin.  No invasive carcinoma. Recommended repeat EGD in 3 weeks.   ESOPHAGEAL DILATION N/A 06/15/2020   Procedure: ESOPHAGEAL DILATION;  Surgeon: Rogene Houston, MD;  Location: [facility];  Service: Endoscopy;  Laterality: N/A;   ESOPHAGOGASTRODUODENOSCOPY  08/2017   Dr. Fuller Plan; LA grade B esophagitis without bleeding s/p biopsied, diffuse moderate inflammation and granularity in the entire examined stomach s/p biopsied, normal examined duodenum s/p biopsy. Gastric biopsy with chronic inactive gastritis without H. pylori, esophageal biopsy with mildly  inflamed squamous mucosa, duodenal biopsy benign.   ESOPHAGOGASTRODUODENOSCOPY N/A 06/15/2020   Procedure: ESOPHAGOGASTRODUODENOSCOPY (EGD);  Surgeon: Rogene Houston, MD;  Proximal esophageal web, focal esophagitis at proximal esophagus s/p biopsy, benign-appearing esophageal stenosis s/p dilated, portal hypertensive gastropathy.  Esophageal biopsy with moderate squamous dysplasia.     ESOPHAGOGASTRODUODENOSCOPY (EGD) WITH PROPOFOL N/A 10/11/2020   Surgeon: Eloise Harman, DO; Eroded, inflamed mucosa in the esophagus. Biopsied. Portal hypertensive gastropathy.  Recommended PPI twice daily.  Pathology with moderate to severe squamous dysplasia.   ESOPHAGOGASTRODUODENOSCOPY (EGD) WITH PROPOFOL N/A 12/12/2020   Surgeon: Irving Copas., MD; Dionisio David, granular, mucosa in the esophagus at 23-26 cm.  Lesion 3 cm in length and encompassed 30-40% of esophageal wall s/p quick biopsy.  Erythematous mucosa in the stomach biopsied, portal hypertensive gastropathy.  Gastric biopsy with mild chronic inflammation, negative for H. pylori.  Esophageal biopsy with moderate to severe squamous dysplasia.   FINGER SURGERY     HEMORRHOID SURGERY     left collar bone surgery     had fx, pin placed and then complitcations caused them to remove most of the bone   left forearm surgery     from left elbow down   TOTAL ABDOMINAL HYSTERECTOMY     still has ovaries   UPPER ESOPHAGEAL ENDOSCOPIC ULTRASOUND (EUS) N/A 12/12/2020   Surgeon: Irving Copas., MD;without esophageal wall thickening, 1 lymph node in the middle paraesophageal mediastinum that was suggestive of benign inflammatory changes, pancreatic lobularity in pancreatic body, queried possibility of chronic pancreatitis though complete EUS not performed and no history of pancreatitis.    Social History:  reports that she has been smoking cigarettes. She has a 60.00 pack-year smoking history. She has never used smokeless tobacco. She reports  current alcohol use of about 12.0 standard drinks of alcohol per week. She reports that she does not use drugs.   No Known Allergies  Family History  Problem Relation Age of Onset   Alcohol abuse Mother    Liver disease Mother        alcoholic cirrhosis   Heart disease Father    Alcohol abuse Father    Lung cancer Father    Diabetes Sister    Cirrhosis Brother        secondary to alcohol.    Liver disease Brother    Liver disease Sister        Alcoholic cirrhosis   Diabetes Maternal Aunt    Kidney disease Maternal Aunt    Diabetes Cousin        mat cousin   Breast cancer Neg Hx    Colon cancer Neg Hx    Esophageal cancer Neg Hx    Stomach cancer Neg Hx    Rectal cancer Neg Hx    Pancreatic cancer Neg Hx    Inflammatory bowel disease Neg Hx      Prior to Admission medications   Medication Sig Start Date End Date Taking? Authorizing Provider  allopurinol (ZYLOPRIM) 300 MG tablet Take 1 tablet (300 mg total) by mouth daily. 11/15/21   Szilagyi Brooklyn, FNP  buPROPion (WELLBUTRIN SR) 150 MG 12 hr tablet TAKE 1 TABLET BY MOUTH TWICE A  DAY 02/26/22   Giarratano Brooklyn, FNP  diclofenac (VOLTAREN) 75 MG EC tablet TAKE 1 TABLET BY MOUTH TWICE A DAY AS NEEDED 04/20/22   [name], MD  methocarbamol (ROBAXIN) 500 MG tablet Take 1 tablet (500 mg total) by mouth every 8 (eight) hours as needed for muscle spasms. 11/15/21   Leask Brooklyn, FNP  metoprolol succinate (TOPROL-XL) 50 MG 24 hr tablet TAKE 1 TABLET BY MOUTH EVERY DAY 03/15/22   Barham Brooklyn, FNP  ondansetron (ZOFRAN-ODT) 4 MG disintegrating tablet Take 1 tablet (4 mg total) by mouth every 8 (eight) hours as needed for nausea or vomiting. 04/10/22   Sime Brooklyn, FNP  pantoprazole (PROTONIX) 40 MG tablet Take 1 tablet (40 mg total) by mouth daily before breakfast. 11/15/21 05/14/22  Krog Brooklyn, FNP  rosuvastatin (CRESTOR) 5 MG tablet Take 1 tablet (5 mg total) by mouth daily. 04/11/22   Kazmierski Brooklyn, FNP   traZODone (DESYREL) 100 MG tablet Take 2 tablets (200 mg total) by mouth at bedtime. 12/08/21   Laforte Brooklyn, FNP  Vilazodone HCl 20 MG TABS Take 1 tablet (20 mg total) by mouth daily. 11/15/21   Hemann Brooklyn, FNP    Physical Exam: BP (!) 149/68 (BP Location: Right Arm)   Pulse 63   Temp 98.3 F (36.8 C) (Oral)   Resp 16   Ht 5' (1.524 m)   Wt 69.9 kg   SpO2 98%   BMI 30.08 kg/m   General: 61 y.o. year-old female well developed well nourished in no acute distress.  Alert and oriented x3. HEENT: NCAT, EOMI Neck: Supple, trachea medial Cardiovascular: Regular rate and rhythm with no rubs or gallops.  No thyromegaly or JVD noted.  No lower extremity edema. 2/4 pulses in all 4 extremities. Respiratory: Clear to auscultation with no wheezes or rales. Good inspiratory effort. Abdomen: Soft, tender to palpation without guarding. Normal bowel sounds x4 quadrants. Muskuloskeletal: No cyanosis, clubbing or edema noted bilaterally Neuro: CN II-XII intact, strength 5/5 x 4, sensation, reflexes intact Skin: No ulcerative lesions noted or rashes Psychiatry: Judgement and insight appear normal. Mood is appropriate for condition and setting          Labs on Admission:  Basic Metabolic Panel: Recent Labs  Lab 04/25/22 2253  NA 129*  K 3.1*  CL 95*  CO2 24  GLUCOSE 105*  BUN 13  CREATININE 0.47  CALCIUM 8.4*   Liver Function Tests: Recent Labs  Lab 04/25/22 2253  AST 59*  ALT 52*  ALKPHOS 168*  BILITOT 1.1  PROT 7.1  ALBUMIN 2.8*   Recent Labs  Lab 04/25/22 2253  LIPASE 30   No results for input(s): "AMMONIA" in the last 168 hours. CBC: Recent Labs  Lab 04/25/22 2253  WBC 17.9*  NEUTROABS 14.5*  HGB 10.0*  HCT 30.4*  MCV 92.1  PLT 330   Cardiac Enzymes: No results for input(s): "CKTOTAL", "CKMB", "CKMBINDEX", "TROPONINI" in the last 168 hours.  BNP (last 3 results) No results for input(s): "BNP" in the last 8760 hours.  ProBNP (last 3 results) No  results for input(s): "PROBNP" in the last 8760 hours.  CBG: No results for input(s): "GLUCAP" in the last 168 hours.  Radiological Exams on Admission: CT ABDOMEN PELVIS W CONTRAST  Result Date: 04/26/2022 CLINICAL DATA:  Abdominal pain. EXAM: CT ABDOMEN AND PELVIS WITH CONTRAST TECHNIQUE: Multidetector CT imaging of the abdomen and pelvis was performed using the standard protocol following bolus administration  of intravenous contrast. RADIATION DOSE REDUCTION: This exam was performed according to the departmental dose-optimization program which includes automated exposure control, adjustment of the mA and/or kV according to patient size and/or use of iterative reconstruction technique. CONTRAST:  157m OMNIPAQUE IOHEXOL 300 MG/ML  SOLN COMPARISON:  April 26, 2020 FINDINGS: Lower chest: No acute abnormality. Hepatobiliary: The liver is cirrhotic in appearance. No focal liver abnormality is seen. No gallstones, gallbladder wall thickening, or biliary dilatation. Pancreas: Unremarkable. No pancreatic ductal dilatation or surrounding inflammatory changes. Spleen: Normal in size without focal abnormality. Adrenals/Urinary Tract: A 2.6 cm diameter low-attenuation (approximately 65.64 Hounsfield units) left adrenal mass is seen. The right adrenal gland is unremarkable. Kidneys are normal, without renal calculi, focal lesion, or hydronephrosis. The urinary bladder is poorly distended and subsequently limited in evaluation. Stomach/Bowel: Stomach is within normal limits. Appendix appears normal. The proximal duodenum is moderately thickened and inflamed with a moderate amount of peri-duodenal inflammatory fat stranding. No evidence of bowel dilatation. Vascular/Lymphatic: Aortic atherosclerosis. 13 mm and 11 mm mesenteric lymph nodes are seen above the junction of the pancreatic body and head. Reproductive: Status post hysterectomy. No adnexal masses. Other: No abdominal wall hernia or abnormality. No  abdominopelvic ascites. Musculoskeletal: No acute or significant osseous findings. IMPRESSION: 1. Moderate severity duodenitis. 2. Hepatic cirrhosis. 3. Stable, benign, left adrenal adenoma. No additional follow-up imaging is recommended. This recommendation follows ACR consensus guidelines: Management of Incidental Adrenal Masses: A White Paper of the ACR Incidental Findings Committee. J Am Coll Radiol 2017;14:1038-1044. 4. Aortic atherosclerosis. Aortic Atherosclerosis (ICD10-I70.0). Electronically Signed   By: TVirgina NorfolkM.D.   On: 04/26/2022 00:17    EKG: I independently viewed the EKG done and my findings are as followed: EKG was not done in the ED  Assessment/Plan Present on Admission:  Abdominal pain  Gastro-esophageal reflux disease without esophagitis  Essential hypertension  Hyponatremia  Smoker  Principal Problem:   Abdominal pain Active Problems:   Essential hypertension   Smoker   Gastro-esophageal reflux disease without esophagitis   Hyponatremia   Hepatic cirrhosis (HCC)   Nausea & vomiting   Hypokalemia   Hypoalbuminemia due to protein-calorie malnutrition (HCC)   Transaminitis   Obesity (BMI 30-39.9)  Abdominal pain, nausea, vomiting due to duodenitis CT abdomen and pelvis was suggestive of duodenitis Continue IV NS at 75 mLs/Hr Continue IV morphine 2 mg q.4h p.r.n. for moderate to severe pain Continue Protonix Continue IV Zofran p.r.n. Continue clear liquid diet with plan to advanced diet as tolerated  Hepatic cirrhosis Continue management as current above Patient has not seen any gastroenterologist since being diagnosed with hepatic cirrhosis, gastroenterology will be consulted  Hyponatremia possibly secondary to beer potomania Na 129, continue IV hydration Continue to monitor sodium with serial BMPs Urine osmolality, serum osmolality and urine sodium will be checked  Hypokalemia K+ 3.1, this will be replenished  Transaminitis possibly related  to patient's history of alcohol abuse and cirrhosis AST 59, ALT 52, ALP 168 Continue to monitor liver enzymes  Leukocytosis possibly reactive WBC 17.9, continue to monitor WBC with morning labs  Hypoalbuminemia secondary to moderate protein calorie malnutrition Albumin 2.8, protein supplement to be provided  Obesity (BMI 30.08) Diet and lifestyle modification  Essential hypertension Continue metoprolol  Mixed hyperlipidemia Crestor will be held at this time due to elevated liver enzymes  GERD Continue Protonix  Tobacco abuse Patient was counseled on tobacco abuse cessation  DVT prophylaxis: Lovenox  Code Status: Full code  Consults: Gastroenterology  Family  Communication: None at bedside  Severity of Illness: The appropriate patient status for this patient is INPATIENT. Inpatient status is judged to be reasonable and necessary in order to provide the required intensity of service to ensure the patient's safety. The patient's presenting symptoms, physical exam findings, and initial radiographic and laboratory data in the context of their chronic comorbidities is felt to place them at high risk for further clinical deterioration. Furthermore, it is not anticipated that the patient will be medically stable for discharge from the hospital within 2 midnights of admission.   * I certify that at the point of admission it is my clinical judgment that the patient will require inpatient hospital care spanning beyond 2 midnights from the point of admission due to high intensity of service, high risk for further deterioration and high frequency of surveillance required.*  Author: Bernadette Hoit, DO 04/26/2022 5:52 AM  For on call review [email or web address].

## 2022-04-26 NOTE — TOC Initial Note (Signed)
Transition of Care [facility]) - Initial/Assessment Note    Patient Details  Name: Regina Richardson MRN: [id number] Date of Birth: [birth year]  [facility]) CM/SW Contact:    Shade Flood, LCSW Phone Number: 04/26/2022, 11:11 AM  Clinical Narrative:                  Pt admitted from home. Received TOC consults for SA treatment resources and possible APS related concern. Spoke with pt to assess. Pt reports she lives alone and is independent in ADLs and she plans to keep it that way. Pt plans to return home at dc. Community resources offered to address [facility] consults and pt stated she does not need anything at this time.  TOC will follow and assist if needs arise.  Expected Discharge Plan: Home/Self Care Barriers to Discharge: Continued Medical Work up   Patient Goals and CMS Choice Patient states their goals for this hospitalization and ongoing recovery are:: go home      Expected Discharge Plan and Services Expected Discharge Plan: Home/Self Care In-house Referral: Clinical Social Work     Living arrangements for the past 2 months: Single Family Home                                      Prior Living Arrangements/Services Living arrangements for the past 2 months: Single Family Home Lives with:: Self Patient language and need for interpreter reviewed:: Yes        Need for Family Participation in Patient Care: No (Comment)     Criminal Activity/Legal Involvement Pertinent to Current Situation/Hospitalization: No - Comment as needed  Activities of Daily Living Home Assistive Devices/Equipment: Eyeglasses ADL Screening (condition at time of admission) Patient's cognitive ability adequate to safely complete daily activities?: Yes Is the patient deaf or have difficulty hearing?: No Does the patient have difficulty seeing, even when wearing glasses/contacts?: No Does the patient have difficulty concentrating, remembering, or making decisions?: Yes Patient able  to express need for assistance with ADLs?: Yes Does the patient have difficulty dressing or bathing?: No Independently performs ADLs?: Yes (appropriate for developmental age) Does the patient have difficulty walking or climbing stairs?: Yes Weakness of Legs: Both Weakness of Arms/Hands: Both  Permission Sought/Granted                  Emotional Assessment     Affect (typically observed): Blunt Orientation: : Oriented to Self, Oriented to Place, Oriented to  Time, Oriented to Situation Alcohol / Substance Use: Alcohol Use Psych Involvement: No (comment)  Admission diagnosis:  Abdominal pain [R10.9] Patient Active Problem List   Diagnosis Date Noted   Abdominal pain 04/26/2022   Nausea & vomiting 04/26/2022   Hypokalemia 04/26/2022   Hypoalbuminemia due to protein-calorie malnutrition ([place]) 04/26/2022   Transaminitis 04/26/2022   Obesity (BMI 30-39.9) 04/26/2022   Precordial chest pain 01/08/2022   Hyperuricemia 06/11/2021   Apnea 06/11/2021   SOB (shortness of breath) 06/11/2021   Palpitations 99/83/3825   Alcoholic cirrhosis ([place]) 05/39/7673   Major depressive disorder, recurrent severe without psychotic features ([place]) 05/02/2021   Generalized anxiety disorder 05/02/2021   Abnormal findings on esophagogastroduodenoscopy (EGD) 12/07/2020   Squamous cell carcinoma of esophagus ([place]) 12/07/2020   Hepatic cirrhosis ([place]) 07/07/2020   Esophagitis 07/07/2020   Hyponatremia 06/14/2020   Alcohol abuse 05/13/2020   Gastro-esophageal reflux disease without esophagitis 04/11/2020   Dysphagia 04/11/2020  Rectal bleeding 04/11/2020   Elevated LFTs 04/11/2020   Other intervertebral disc degeneration, lumbar region 05/08/2017   Midline low back pain 11/22/2016   Scoliosis 11/22/2016   Smoker 09/07/2015   Essential hypertension 05/25/2015   Insomnia 05/25/2015   PCP:  Ficken Brooklyn, FNP Pharmacy:   [id number] [place] [id number]: [phone]: [phone]    Social Determinants of Health (SDOH) Interventions    Readmission Risk Interventions     No data to display

## 2022-04-26 NOTE — Progress Notes (Addendum)
PROGRESS NOTE     Regina Richardson, is a 61 y.o. female, DOB - 1960-08-10, VEL:[id number]  Admit date - 04/25/2022   Admitting Physician Bernadette Hoit, DO  Outpatient Primary MD for the patient is Kuznicki Brooklyn, FNP  LOS - 0  Chief Complaint  Patient presents with   Abdominal Pain        Brief Narrative:  61 y.o. female with medical history significant of essential hypertension, GERD, alcoholic cirrhosis, tobacco abuse admitted on 04/26/2022 with abdominal pain nausea and vomiting with imaging studies suggesting duodenitis    -Assessment and Plan: 1)Duodenitis----in an alcoholic female with abdominal pain nausea vomiting -CT abdomen and pelvis consistent with moderate severity duodenitis -Denies NSAID use -Continue IV Protonix -GI consult requested for possible EGD  2)Hypokalemia/Hypomagnesemia--- in an alcoholic female with emesis -Replace and recheck  3)Hyponatremia----suspect beer potomania -IV fluids as ordered  4) alcohol abuse----it is unclear when patient had a last alcoholic drink -Lorazepam per CIWA protocol -Folic acid and multivitamin as ordered- --patient declines resources for alcohol rehab  5)- liver cirrhosis with elevated LFTs---patient with history of alcohol abuse, dyslipidemia and obesity -AST to ALT ratio is not consistent with alcoholic liver disease -Check viral hepatitis profile -Check fasting lipid profile  6) tobacco abuse--- patient is not interested in smoking cessation  7) leukocytosis--- suspect reactive in the setting of #1 above  8)HTN--continue metoprolol -IV hydralazine as needed  9) class II obesity- -Low calorie diet, portion control and increase physical activity discussed with patient -Body mass index is 30.08 kg/m.  -Total care time today is about 57 minutes  Disposition/Need for in-Hospital Stay- patient unable to be discharged at this time due to -severe abdominal pain with duodenitis and persistent emesis requiring IV  fluids, IV Protonix and IV antiemetics pending further GI evaluation and possible EGD  Status is: Inpatient   Disposition: The patient is from: Home              Anticipated d/c is to: Home              Anticipated d/c date is: 2 days              Patient currently is not medically stable to d/c. Barriers: Not Clinically Stable-  Code Status :  -  Code Status: Full Code   Family Communication:    NA (patient is alert, awake and coherent)   DVT Prophylaxis  :   - SCDs  enoxaparin (LOVENOX) injection 40 mg Start: 04/26/22 1000 SCDs Start: 04/26/22 0722   Lab Results  Component Value Date   PLT 296 04/26/2022    Inpatient Medications  Scheduled Meds:  diazepam  2 mg Oral TID   enoxaparin (LOVENOX) injection  40 mg Subcutaneous Q24H   feeding supplement  237 mL Oral BID BM   folic acid  1 mg Oral Daily   metoprolol succinate  50 mg Oral Daily   multivitamin with minerals  1 tablet Oral Daily   [START ON 04/29/2022] pantoprazole  40 mg Intravenous Q12H   thiamine  100 mg Oral Daily   Or   thiamine  100 mg Intravenous Daily   Continuous Infusions:  sodium chloride 75 mL/hr at 04/26/22 0651   pantoprazole 8 mg/hr (04/26/22 0936)   PRN Meds:.LORazepam **OR** LORazepam, morphine injection, ondansetron (ZOFRAN) IV   Anti-infectives (From admission, onward)    None       Subjective: Tamelia Critz today has no fevers, no further emesis,  No chest  pain,   - --Tolerating clear liquid diet okay -Denies melena --Total care time today is about 57 minutes  Objective: Vitals:   04/26/22 0301 04/26/22 0311 04/26/22 0700 04/26/22 0722  BP:  (!) 149/68 (!) 150/64   Pulse:  63 (!) 59   Resp:  16 18   Temp:  98.3 F (36.8 C) 98.6 F (37 C)   TempSrc:  Oral Oral   SpO2:  98% 98% 98%  Weight: 69.9 kg     Height: 5' (1.524 m)       Intake/Output Summary (Last 24 hours) at 04/26/2022 1036 Last data filed at 04/26/2022 0936 Gross per 24 hour  Intake 1145.29 ml  Output --   Net 1145.29 ml   Filed Weights   04/25/22 2134 04/26/22 0301  Weight: 72.6 kg 69.9 kg    Physical Exam  Gen:- Awake Alert, no acute distress HEENT:- [place].AT, No sclera icterus Neck-Supple Neck,No JVD,.  Lungs-  CTAB , fair symmetrical air movement CV- S1, S2 normal, regular  Abd-  +ve B.Sounds, Abd Soft, epigastric and upper quadrant mild tenderness no rebound no guarding Extremity/Skin:- No  edema, pedal pulses present  Psych-affect is slightly anxious, oriented x3 Neuro-no new focal deficits, no tremors  Data Reviewed: I have personally reviewed following labs and imaging studies  CBC: Recent Labs  Lab 04/25/22 2253 04/26/22 0741  WBC 17.9* 15.9*  NEUTROABS 14.5*  --   HGB 10.0* 9.9*  HCT 30.4* 30.1*  MCV 92.1 93.2  PLT 330 563   Basic Metabolic Panel: Recent Labs  Lab 04/25/22 2253 04/26/22 0741  NA 129* 130*  K 3.1* 3.2*  CL 95* 97*  CO2 24 24  GLUCOSE 105* 85  BUN 13 13  CREATININE 0.47 0.48  CALCIUM 8.4* 8.2*  MG  --  1.5*   GFR: Estimated Creatinine Clearance: 64.5 mL/min (by C-G formula based on SCr of 0.48 mg/dL). Liver Function Tests: Recent Labs  Lab 04/25/22 2253 04/26/22 0741  AST 59* 58*  ALT 52* 55*  ALKPHOS 168* 171*  BILITOT 1.1 0.9  PROT 7.1 6.8  ALBUMIN 2.8* 2.7*   Radiology Studies: CT ABDOMEN PELVIS W CONTRAST  Result Date: 04/26/2022 CLINICAL DATA:  Abdominal pain. EXAM: CT ABDOMEN AND PELVIS WITH CONTRAST TECHNIQUE: Multidetector CT imaging of the abdomen and pelvis was performed using the standard protocol following bolus administration of intravenous contrast. RADIATION DOSE REDUCTION: This exam was performed according to the departmental dose-optimization program which includes automated exposure control, adjustment of the mA and/or kV according to patient size and/or use of iterative reconstruction technique. CONTRAST:  170m OMNIPAQUE IOHEXOL 300 MG/ML  SOLN COMPARISON:  April 26, 2020 FINDINGS: Lower chest: No acute  abnormality. Hepatobiliary: The liver is cirrhotic in appearance. No focal liver abnormality is seen. No gallstones, gallbladder wall thickening, or biliary dilatation. Pancreas: Unremarkable. No pancreatic ductal dilatation or surrounding inflammatory changes. Spleen: Normal in size without focal abnormality. Adrenals/Urinary Tract: A 2.6 cm diameter low-attenuation (approximately 65.64 Hounsfield units) left adrenal mass is seen. The right adrenal gland is unremarkable. Kidneys are normal, without renal calculi, focal lesion, or hydronephrosis. The urinary bladder is poorly distended and subsequently limited in evaluation. Stomach/Bowel: Stomach is within normal limits. Appendix appears normal. The proximal duodenum is moderately thickened and inflamed with a moderate amount of peri-duodenal inflammatory fat stranding. No evidence of bowel dilatation. Vascular/Lymphatic: Aortic atherosclerosis. 13 mm and 11 mm mesenteric lymph nodes are seen above the junction of the pancreatic body and head. Reproductive: Status  post hysterectomy. No adnexal masses. Other: No abdominal wall hernia or abnormality. No abdominopelvic ascites. Musculoskeletal: No acute or significant osseous findings. IMPRESSION: 1. Moderate severity duodenitis. 2. Hepatic cirrhosis. 3. Stable, benign, left adrenal adenoma. No additional follow-up imaging is recommended. This recommendation follows ACR consensus guidelines: Management of Incidental Adrenal Masses: A White Paper of the ACR Incidental Findings Committee. J Am Coll Radiol 2017;14:1038-1044. 4. Aortic atherosclerosis. Aortic Atherosclerosis (ICD10-I70.0). Electronically Signed   By: Virgina Norfolk M.D.   On: 04/26/2022 00:17     Scheduled Meds:  diazepam  2 mg Oral TID   enoxaparin (LOVENOX) injection  40 mg Subcutaneous Q24H   feeding supplement  237 mL Oral BID BM   folic acid  1 mg Oral Daily   metoprolol succinate  50 mg Oral Daily   multivitamin with minerals  1 tablet  Oral Daily   [START ON 04/29/2022] pantoprazole  40 mg Intravenous Q12H   thiamine  100 mg Oral Daily   Or   thiamine  100 mg Intravenous Daily   Continuous Infusions:  sodium chloride 75 mL/hr at 04/26/22 0651   pantoprazole 8 mg/hr (04/26/22 0936)     LOS: 0 days   Roxan Hockey M.D on 04/26/2022 at 10:36 AM  Go to [email or web address] - for contact info  [facility] - Office  [phone]  If 7PM-7AM, please contact night-coverage [email or web address] 04/26/2022, 10:36 AM

## 2022-04-26 NOTE — Consult Note (Signed)
Gastroenterology Consult   Referring Provider: No ref. provider found Primary Care Physician:  Zipper Brooklyn, FNP Primary Gastroenterologist:  Elon Alas. Abbey Chatters, DO Patient ID: Regina Richardson; [id number]; [birth year]   Admit date: 04/25/2022  LOS: 0 days   Date of Consultation: 04/26/2022  Reason for Consultation: Hepatic cirrhosis (newly diagnosed)   History of Present Illness   Regina Richardson is a 61 y.o. female with HTN, GERD, known alcoholic cirrhosis, squamous cell carcinoma of the esophagus resected at [facility], presents to ED with one week history of abdominal pain, vomiting.   In the ED: White blood cell count 17,900, hemoglobin 10, MCV 92.1, platelets 330,000, sodium 129, potassium 3.1, glucose 105, BUN 13, creatinine 0.47, albumin 2.8, total bilirubin 1.1, alk phos 168, AST 59, ALT 52.  Lipase 30.  Today her white blood cell count is down to 15,900, hemoglobin 9.9, sodium 130, potassium 3.2, alk phos 171, AST 58, ALT 85, total bilirubin 0.9.  Magnesium 1.5.  CT A/P with contrast: moderately severe duodenitits, hepatic cirrhosis, stable benign left adrenal adenoma.  U/s 04/19/22 consistent with cirrhosis and mild cholelithiasis.   Today: Patient is a difficult historian. She is also frustrated and resistant to questions.  Extensive review of medical record with details as outlined.  Patient seen by our practice previously. She was told of cirrhosis in 04/2020. She had work up at that time. Hemochromatosis DNA negative for C282Y and H63D, ferritin returned to normal at 91, alk phos 195, AST 66, ALT 31, ASMA negative, ANA negative, ANA negative, IgG elevated at 1702, IgA elevated at 783.  Suspected IgA and IgG elevation may be secondary to alcohol.  AFP 3.5.hepatitis C antibody negative, hepatitis B surface antigen negative.  Advise she continue to work towards alcohol cessation.  She was lost to follow-up.  She has been followed at [facility] for history of squamous cell carcinoma of the  esophagus, due for 1 year surveillance EGD this month.  She has a history of heavy alcohol abuse.  She states her last drink was about 10 days ago.  She has had abdominal pain, "been a while".  About 1 week ago her abdominal pain worsened and she developed intractable vomiting.  This occurred about 3 to 4 days after last alcoholic beverage.  Pain starts in the chest area and radiates down into the epigastrium and further into the lower abdomen.  Every time she vomits she has a BM.  Stools are watery.  Denies any black or bloody stools.  No hematemesis.  She tolerated some clear liquids today.  Patient had colonoscopy in February 2019 with Dr. Fuller Plan with [facility] for abnormal cecum on CT.  Findings included prominent and moderately lipomatous ileocecal valve, 9 mm sessile polyp in hepatic flexure, 8 mm sessile polyp in the descending colon, otherwise normal exam.  Pathology with sessile serrated polyp and hyperplastic polyp. Recommended repeat colonoscopy in 5 years.  EGD at the same time as colonoscopy in February 2019 for anemia and epigastric abdominal pain.  She was found to have LA grade B esophagitis without bleeding s/p biopsied, diffuse moderate inflammation and granularity in the entire examined stomach s/p biopsied, normal examined duodenum s/p biopsy. Gastric biopsy with chronic inactive gastritis without H. pylori, esophageal biopsy with mildly inflamed squamous mucosa, duodenal biopsy benign. H.pylori breast test positive 07/2017. Patient denies treatment at that time.  Procedures 06/15/2020 performed by Dr. Laural Golden: Colonoscopy: 2 polyps resected and retrieved, 1 small polyp in the distal sigmoid biopsy,  external hemorrhoids.  Pathology with 1 tubular adenoma and 1 hyperplastic polyp.  EGD: Proximal esophageal web, focal esophagitis at proximal esophagus s/p biopsy, benign-appearing esophageal stenosis s/p dilated, portal hypertensive gastropathy.  Esophageal biopsy with moderate squamous  dysplasia.  Dr. Laural Golden recommended repeat EGD in about 8 weeks.  EGD March 2022: Eroded, inflamed mucosa in the esophagus, portal hypertension gastropathy, esophageal biopsy showed moderate to severe squamous dysplasia   EGD/EUS with Dr. Rush Landmark May 2022:  Dionisio David (with contact bleeding), granular, smooth, texture changed mucosa in the esophagus at 23-26 cm was encountered. Under NBI imaging and then spraying with Lugol's stain I could visualize a lesion that was 3 cm in length and encompassed 30-40% of the esophageal wall. Biopsied quickly but not as in depth as I would have like due to increased coughing and also 2 prior EGDs with Squamous dysplasia already being noted. - Erythematous mucosa in the stomach. Biopsied. - Possible mild portal hypertensive gastropathy in proximal stomach (though again, normal spleen size and only hepatomegaly appreciated on most recent CT). - Endosonographic imaging in the esophagus showed no wall thickening throughout. - One benign lymph node was visualized in the middle paraesophageal mediastinum (level 18M). Tissue has not been obtained. However, the endosonographic appearance is suggestive of benign inflammatory changes. - Pancreatic parenchymal abnormalities consisting of lobularity were noted in the pancreatic     Biopsy showed mild chronic gastric inflammation, negative for H. pylori, distal esophageal biopsy with slight inflammation, biopsies from 23 to 36 cm showed moderate to severe squamous dysplasia.  August 2022: At [facility]    Impression: - Two large plaques in the prox - middle third of the  esophagus. Clips (MR conditional) were placed. - Normal stomach. - Normal examined duodenum. - Endoscopic submucosal dissection was performed.  Resection and retrieval were complete. -Two foci of squamous cell carcinoma in situ, focally extending to the peripheral inked margin.No invasive carcinoma is seen.Multiple levels are examined  October 2022: At  [facility].   Impression: - Scar in the proximal esophagus and in the mid  esophagus. Biopsied. Dilated. - Normal stomach. - Normal examined duodenum. -if path normal, repeat EGD in 1 year -path showed Esophageal squamous mucosa with reactive change. Negative for dysplasia or carcinoma.   Prior to Admission medications   Medication Sig Start Date End Date Taking? Authorizing Provider  allopurinol (ZYLOPRIM) 300 MG tablet Take 1 tablet (300 mg total) by mouth daily. 11/15/21   Duzan Brooklyn, FNP  buPROPion [facility] SR) 150 MG 12 hr tablet TAKE 1 TABLET BY MOUTH TWICE A DAY 02/26/22   Hendricks Limes F, FNP  diclofenac (VOLTAREN) 75 MG EC tablet TAKE 1 TABLET BY MOUTH TWICE A DAY AS NEEDED 04/20/22   [name], MD  methocarbamol (ROBAXIN) 500 MG tablet Take 1 tablet (500 mg total) by mouth every 8 (eight) hours as needed for muscle spasms. 11/15/21   Pavlik Brooklyn, FNP  metoprolol succinate (TOPROL-XL) 50 MG 24 hr tablet TAKE 1 TABLET BY MOUTH EVERY DAY 03/15/22   Woodin Brooklyn, FNP  ondansetron (ZOFRAN-ODT) 4 MG disintegrating tablet Take 1 tablet (4 mg total) by mouth every 8 (eight) hours as needed for nausea or vomiting. 04/10/22   Baumgart Brooklyn, FNP  pantoprazole (PROTONIX) 40 MG tablet Take 1 tablet (40 mg total) by mouth daily before breakfast. 11/15/21 05/14/22  Fussell Brooklyn, FNP  rosuvastatin (CRESTOR) 5 MG tablet Take 1 tablet (5 mg total) by mouth daily. 04/11/22   Hendricks Limes  F, FNP  traZODone (DESYREL) 100 MG tablet Take 2 tablets (200 mg total) by mouth at bedtime. 12/08/21   Hust Brooklyn, FNP  Vilazodone HCl 20 MG TABS Take 1 tablet (20 mg total) by mouth daily. 11/15/21   Claw Brooklyn, FNP    Current Facility-Administered Medications  Medication Dose Route Frequency Provider Last Rate Last Admin   0.9 %  sodium chloride infusion   Intravenous Continuous Adefeso, Oladapo, DO 75 mL/hr at 04/26/22 0651 New Bag at 04/26/22 0651   enoxaparin (LOVENOX) injection 40  mg  40 mg Subcutaneous Q24H Adefeso, Oladapo, DO   40 mg at 04/26/22 8366   feeding supplement (ENSURE ENLIVE / ENSURE PLUS) liquid 237 mL  237 mL Oral BID BM Adefeso, Oladapo, DO       metoprolol succinate (TOPROL-XL) 24 hr tablet 50 mg  50 mg Oral Daily Adefeso, Oladapo, DO   50 mg at 04/26/22 2947   morphine (PF) 2 MG/ML injection 2 mg  2 mg Intravenous Q4H PRN Adefeso, Oladapo, DO       ondansetron (ZOFRAN) injection 4 mg  4 mg Intravenous Q6H PRN Adefeso, Oladapo, DO       [START ON 04/29/2022] pantoprazole (PROTONIX) injection 40 mg  40 mg Intravenous Q12H Adefeso, Oladapo, DO       pantoprozole (PROTONIX) 80 mg /NS 100 mL infusion  8 mg/hr Intravenous Continuous Adefeso, Oladapo, DO 10 mL/hr at 04/26/22 0135 8 mg/hr at 04/26/22 0135   potassium chloride 10 mEq in 100 mL IVPB  10 mEq Intravenous Q1 Hr x 3 Adefeso, Oladapo, DO 100 mL/hr at 04/26/22 0808 10 mEq at 04/26/22 0808    Allergies as of 04/25/2022   (No Known Allergies)    Past Medical History:  Diagnosis Date   Alcoholism ([place])    Anxiety    Arrhythmia    heart   Arthritis    Cancer ([place])    cervical cancer in the 80s   Cirrhosis ([place]) 65/4650   alcoholic;    Depression    Dysrhythmia    GERD (gastroesophageal reflux disease)    Hypertension    Osteoporosis    Scoliosis    Urinary incontinence     Past Surgical History:  Procedure Laterality Date   BIOPSY  10/11/2020   Procedure: BIOPSY;  Surgeon: Eloise Harman, DO;  [facility];  Service: Endoscopy;;   BIOPSY  12/12/2020   Procedure: BIOPSY;  Surgeon: Irving Copas., MD;  Location: [facility];  Service: Gastroenterology;;   CERVICAL BIOPSY  W/ LOOP ELECTRODE EXCISION     CESAREAN SECTION     COLONOSCOPY  08/2017   Dr. Fuller Plan;  prominent and moderately lipomatous ileocecal valve, 9 mm sessile polyp in hepatic flexure, 8 mm sessile polyp in the descending colon, otherwise normal exam.  Pathology with sessile serrated polyp and  hyperplastic polyp.  Recommended repeat colonoscopy in 5 years.   COLONOSCOPY N/A 06/15/2020   Procedure: COLONOSCOPY;  Surgeon: Rogene Houston, MD; 2 polyps resected and retrieved, 1 small polyp in the distal sigmoid biopsy, external hemorrhoids.  Pathology with 1 tubular adenoma and 1 hyperplastic polyp.    COLPOSCOPY     EGD with mucosal resection  03/16/2021   [facility]: 2 large plaques in the proximal-mid third of the esophagus s/p endoscopic submucosal resection and retrieval, placement of 4 MR conditional clips.  Normal stomach, normal examined duodenum.  Pathology revealed squamous cell carcinoma in situ focally extending to the peripheral inked  margin.  No invasive carcinoma. Recommended repeat EGD in 3 weeks.   ESOPHAGEAL DILATION N/A 06/15/2020   Procedure: ESOPHAGEAL DILATION;  Surgeon: Rogene Houston, MD;  Location: [facility];  Service: Endoscopy;  Laterality: N/A;   ESOPHAGOGASTRODUODENOSCOPY  08/2017   Dr. Fuller Plan; LA grade B esophagitis without bleeding s/p biopsied, diffuse moderate inflammation and granularity in the entire examined stomach s/p biopsied, normal examined duodenum s/p biopsy. Gastric biopsy with chronic inactive gastritis without H. pylori, esophageal biopsy with mildly inflamed squamous mucosa, duodenal biopsy benign.   ESOPHAGOGASTRODUODENOSCOPY N/A 06/15/2020   Procedure: ESOPHAGOGASTRODUODENOSCOPY (EGD);  Surgeon: Rogene Houston, MD;  Proximal esophageal web, focal esophagitis at proximal esophagus s/p biopsy, benign-appearing esophageal stenosis s/p dilated, portal hypertensive gastropathy.  Esophageal biopsy with moderate squamous dysplasia.     ESOPHAGOGASTRODUODENOSCOPY (EGD) WITH PROPOFOL N/A 10/11/2020   Surgeon: Eloise Harman, DO; Eroded, inflamed mucosa in the esophagus. Biopsied. Portal hypertensive gastropathy.  Recommended PPI twice daily.  Pathology with moderate to severe squamous dysplasia.   ESOPHAGOGASTRODUODENOSCOPY (EGD) WITH PROPOFOL N/A  12/12/2020   Surgeon: Irving Copas., MD; Dionisio David, granular, mucosa in the esophagus at 23-26 cm.  Lesion 3 cm in length and encompassed 30-40% of esophageal wall s/p quick biopsy.  Erythematous mucosa in the stomach biopsied, portal hypertensive gastropathy.  Gastric biopsy with mild chronic inflammation, negative for H. pylori.  Esophageal biopsy with moderate to severe squamous dysplasia.   FINGER SURGERY     HEMORRHOID SURGERY     left collar bone surgery     had fx, pin placed and then complitcations caused them to remove most of the bone   left forearm surgery     from left elbow down   TOTAL ABDOMINAL HYSTERECTOMY     still has ovaries   UPPER ESOPHAGEAL ENDOSCOPIC ULTRASOUND (EUS) N/A 12/12/2020   Surgeon: Irving Copas., MD;without esophageal wall thickening, 1 lymph node in the middle paraesophageal mediastinum that was suggestive of benign inflammatory changes, pancreatic lobularity in pancreatic body, queried possibility of chronic pancreatitis though complete EUS not performed and no history of pancreatitis.    Family History  Problem Relation Age of Onset   Alcohol abuse Mother    Liver disease Mother        alcoholic cirrhosis   Heart disease Father    Alcohol abuse Father    Lung cancer Father    Diabetes Sister    Cirrhosis Brother        secondary to alcohol.    Liver disease Brother    Liver disease Sister        Alcoholic cirrhosis   Diabetes Maternal Aunt    Kidney disease Maternal Aunt    Diabetes Cousin        mat cousin   Breast cancer Neg Hx    Colon cancer Neg Hx    Esophageal cancer Neg Hx    Stomach cancer Neg Hx    Rectal cancer Neg Hx    Pancreatic cancer Neg Hx    Inflammatory bowel disease Neg Hx     Social History   Socioeconomic History   Marital status: Married    Spouse name: Not on file   Number of children: 1   Years of education: Not on file   Highest education level: Not on file  Occupational History    Occupation: retired  Tobacco Use   Smoking status: Every Day    Packs/day: 1.50    Years: 40.00    Total  pack years: 60.00    Types: Cigarettes   Smokeless tobacco: Never  Vaping Use   Vaping Use: Never used  Substance and Sexual Activity   Alcohol use: Yes    Alcohol/week: 12.0 standard drinks of alcohol    Types: 12 Cans of beer per week    Comment: 12 cans of beer daily   Drug use: No    Comment: Tried cocaine and marijuana in the 60s.    Sexual activity: Not Currently    Birth control/protection: None  Other Topics Concern   Not on file  Social History Narrative   Not on file   Social Determinants of Health   Financial Resource Strain: Not on file  Food Insecurity: No Food Insecurity (04/26/2022)   Hunger Vital Sign    Worried About Running Out of Food in the Last Year: Never true    Ran Out of Food in the Last Year: Never true  Transportation Needs: No Transportation Needs (04/26/2022)   PRAPARE - Hydrologist (Medical): No    Lack of Transportation (Non-Medical): No  Physical Activity: Not on file  Stress: Not on file  Social Connections: Not on file  Intimate Partner Violence: Not At Risk (04/26/2022)   Humiliation, Afraid, Rape, and Kick questionnaire    Fear of Current or Ex-Partner: No    Emotionally Abused: No    Physically Abused: No    Sexually Abused: No     Review of System:   General: Negative for  weight loss, fever, chills, fatigue, weakness.  See HPI Eyes: Negative for vision changes.  ENT: Negative for hoarseness, difficulty swallowing , nasal congestion. CV: Negative for chest pain, angina, palpitations, dyspnea on exertion, peripheral edema.  Respiratory: Negative for dyspnea at rest, dyspnea on exertion, cough, sputum, wheezing.  GI: See history of present illness. GU:  Negative for dysuria, hematuria, urinary incontinence, urinary frequency, nocturnal urination.  MS: Negative for joint pain, low back pain.  Derm:  Negative for rash or itching.  Neuro: Negative for weakness, abnormal sensation, seizure, frequent headaches, memory loss, confusion.  Psych: Negative for anxiety, depression, suicidal ideation, hallucinations.  Endo: Negative for unusual weight change.  Heme: Negative for bruising or bleeding. Allergy: Negative for rash or hives.      Physical Examination:   Vital signs in last 24 hours: Temp:  [98.3 F (36.8 C)-98.7 F (37.1 C)] 98.6 F (37 C) (10/05 0700) Pulse Rate:  [59-74] 59 (10/05 0700) Resp:  [12-19] 18 (10/05 0700) BP: (149-185)/(64-97) 150/64 (10/05 0700) SpO2:  [96 %-99 %] 98 % (10/05 0722) FiO2 (%):  [0 %] 0 % (10/05 0722) Weight:  [69.9 kg-72.6 kg] 69.9 kg (10/05 0301) Last BM Date : 04/26/22  General: Appears older than stated age.  She is quite frustrated today.  Reports not enough sleep, IV going off all the time. no acute distress.  Head: Normocephalic, atraumatic.   Eyes: Conjunctiva pink, no icterus. Mouth: Oropharyngeal mucosa moist and pink , no lesions erythema or exudate. Neck: Supple without thyromegaly, masses, or lymphadenopathy.  Lungs: Clear to auscultation bilaterally.  Heart: Regular rate and rhythm, no murmurs rubs or gallops.  Abdomen: Bowel sounds are normal, nondistended, no hepatosplenomegaly or masses, no abdominal bruits or hernia , no rebound or guarding.  Mild upper abdominal tenderness Rectal: Not performed Extremities: No lower extremity edema, clubbing, deformity.  Neuro: Alert and oriented x 4 , grossly normal neurologically.  Skin: Warm and dry, no rash or jaundice.  Psych: Alert and cooperative, normal affect.  See general        Intake/Output from previous day: 10/04 0701 - 10/05 0700 In: 1080 [P.O.:480; IV Piggyback:600] Out: -  Intake/Output this shift: No intake/output data recorded.  Lab Results:   CBC Recent Labs    04/25/22 2253 04/26/22 0741  WBC 17.9* 15.9*  HGB 10.0* 9.9*  HCT 30.4* 30.1*  MCV 92.1 93.2   PLT 330 296   BMET Recent Labs    04/25/22 2253 04/26/22 0741  NA 129* 130*  K 3.1* 3.2*  CL 95* 97*  CO2 24 24  GLUCOSE 105* 85  BUN 13 13  CREATININE 0.47 0.48  CALCIUM 8.4* 8.2*   LFT Recent Labs    04/25/22 2253 04/26/22 0741  BILITOT 1.1 0.9  ALKPHOS 168* 171*  AST 59* 58*  ALT 52* 55*  PROT 7.1 6.8  ALBUMIN 2.8* 2.7*    Lipase Recent Labs    04/25/22 2253  LIPASE 30    PT/INR No results for input(s): "LABPROT", "INR" in the last 72 hours.   Hepatitis Panel No results for input(s): "HEPBSAG", "HCVAB", "HEPAIGM", "HEPBIGM" in the last 72 hours.   Imaging Studies:   CT ABDOMEN PELVIS W CONTRAST  Result Date: 04/26/2022 CLINICAL DATA:  Abdominal pain. EXAM: CT ABDOMEN AND PELVIS WITH CONTRAST TECHNIQUE: Multidetector CT imaging of the abdomen and pelvis was performed using the standard protocol following bolus administration of intravenous contrast. RADIATION DOSE REDUCTION: This exam was performed according to the departmental dose-optimization program which includes automated exposure control, adjustment of the mA and/or kV according to patient size and/or use of iterative reconstruction technique. CONTRAST:  138m OMNIPAQUE IOHEXOL 300 MG/ML  SOLN COMPARISON:  April 26, 2020 FINDINGS: Lower chest: No acute abnormality. Hepatobiliary: The liver is cirrhotic in appearance. No focal liver abnormality is seen. No gallstones, gallbladder wall thickening, or biliary dilatation. Pancreas: Unremarkable. No pancreatic ductal dilatation or surrounding inflammatory changes. Spleen: Normal in size without focal abnormality. Adrenals/Urinary Tract: A 2.6 cm diameter low-attenuation (approximately 65.64 Hounsfield units) left adrenal mass is seen. The right adrenal gland is unremarkable. Kidneys are normal, without renal calculi, focal lesion, or hydronephrosis. The urinary bladder is poorly distended and subsequently limited in evaluation. Stomach/Bowel: Stomach is within  normal limits. Appendix appears normal. The proximal duodenum is moderately thickened and inflamed with a moderate amount of peri-duodenal inflammatory fat stranding. No evidence of bowel dilatation. Vascular/Lymphatic: Aortic atherosclerosis. 13 mm and 11 mm mesenteric lymph nodes are seen above the junction of the pancreatic body and head. Reproductive: Status post hysterectomy. No adnexal masses. Other: No abdominal wall hernia or abnormality. No abdominopelvic ascites. Musculoskeletal: No acute or significant osseous findings. IMPRESSION: 1. Moderate severity duodenitis. 2. Hepatic cirrhosis. 3. Stable, benign, left adrenal adenoma. No additional follow-up imaging is recommended. This recommendation follows ACR consensus guidelines: Management of Incidental Adrenal Masses: A White Paper of the ACR Incidental Findings Committee. J Am Coll Radiol 2017;14:1038-1044. 4. Aortic atherosclerosis. Aortic Atherosclerosis (ICD10-I70.0). Electronically Signed   By: TVirgina NorfolkM.D.   On: 04/26/2022 00:17   [place]domen Limited RUQ (LIVER/GB)  Result Date: 04/19/2022 CLINICAL DATA:  Hepatomegaly.  Cirrhosis. EXAM: ULTRASOUND ABDOMEN LIMITED RIGHT UPPER QUADRANT COMPARISON:  Abdominal ultrasound April 26, 2020. CT of the abdomen and pelvis April 26, 2020. FINDINGS: Gallbladder: Sludge and a few stones in the gallbladder. No wall thickening, pericholecystic fluid, or Murphy's sign. Common bile duct: Diameter: 2.7 mm Liver: Increased heterogeneous echogenicity. Mildly nodular contour. No  liver mass. Portal vein is patent on color Doppler imaging with normal direction of blood flow towards the liver. Other: None. IMPRESSION: 1. A mildly nodular contour and increased heterogeneous echogenicity associated with the liver are consistent with reported history of cirrhosis. 2. Mild cholelithiasis and sludge in an otherwise normal appearing gallbladder. 3. No other abnormalities. Electronically Signed   By: Dorise Bullion  III M.D.   On: 04/19/2022 14:04  [4 week]  Assessment:   61 year old female with history of alcohol abuse,known alcoholic cirrhosis, squamous cell carcinoma of the esophagus resected at [facility], hypertension presents to ED with one week history of abdominal pain, vomiting.  Symptoms began a few days after her last alcoholic beverage.  GI consulted for new diagnosis of cirrhosis.  Cirrhosis: Well-documented cirrhosis in 2021, patient had work-up with [place] but unfortunately she was lost to follow-up.  Cirrhosis suspected to be due to alcohol use.  She is not immune to hepatitis a or B, vaccinations encouraged previously.  Recently stopped drinking about 10 days ago.  Spleen normal in size.  Multiple upper endoscopies without esophageal varices although there has been concern for portal hypertensive gastropathy previously.  Thus far disease has been compensated.  Abdominal pain, vomiting: CT with moderate severity of duodenitis.  She has cholelithiasis but no acute cholecystitis on imaging.  Raplon phosphatase has been elevated multiple times in the past few months, more recently, and AST/ALT.  We will need to follow.  Upper GI symptoms are likely multifactorial, cannot exclude element of alcohol withdrawal contributing.  Duodenitis likely playing a role.   Plan:   Recommend ongoing alcohol cessation. Agree with IV PPI every 12 hours.  IV PPI infusion not needed. Consider upper endoscopy this admission. Labs for MELD.   LOS: 0 days   We would like to thank you for the opportunity to participate in the care of Regina Richardson.  Laureen Ochs. Bernarda Caffey [facility] [phone] 10/5/20238:38 AM

## 2022-04-27 ENCOUNTER — Inpatient Hospital Stay (HOSPITAL_COMMUNITY): Payer: Medicare HMO | Admitting: Anesthesiology

## 2022-04-27 ENCOUNTER — Encounter (HOSPITAL_COMMUNITY)
Admission: EM | Disposition: A | Discharge status: Home / Self Care | Payer: Self-pay | Source: Home / Self Care | Attending: Family Medicine

## 2022-04-27 ENCOUNTER — Encounter (HOSPITAL_COMMUNITY): Payer: Self-pay | Admitting: Internal Medicine

## 2022-04-27 DIAGNOSIS — K269 Duodenal ulcer, unspecified as acute or chronic, without hemorrhage or perforation: Secondary | ICD-10-CM

## 2022-04-27 DIAGNOSIS — F17210 Nicotine dependence, cigarettes, uncomplicated: Secondary | ICD-10-CM

## 2022-04-27 DIAGNOSIS — R933 Abnormal findings on diagnostic imaging of other parts of digestive tract: Secondary | ICD-10-CM

## 2022-04-27 DIAGNOSIS — K2289 Other specified disease of esophagus: Secondary | ICD-10-CM

## 2022-04-27 DIAGNOSIS — R1013 Epigastric pain: Secondary | ICD-10-CM | POA: Diagnosis not present

## 2022-04-27 DIAGNOSIS — R7989 Other specified abnormal findings of blood chemistry: Secondary | ICD-10-CM | POA: Diagnosis not present

## 2022-04-27 DIAGNOSIS — F418 Other specified anxiety disorders: Secondary | ICD-10-CM

## 2022-04-27 DIAGNOSIS — I10 Essential (primary) hypertension: Secondary | ICD-10-CM | POA: Diagnosis not present

## 2022-04-27 DIAGNOSIS — K219 Gastro-esophageal reflux disease without esophagitis: Secondary | ICD-10-CM | POA: Diagnosis not present

## 2022-04-27 HISTORY — PX: ESOPHAGOGASTRODUODENOSCOPY (EGD) WITH PROPOFOL: SHX5813

## 2022-04-27 HISTORY — PX: BIOPSY: SHX5522

## 2022-04-27 LAB — COMPREHENSIVE METABOLIC PANEL
ALT: 49 U/L — ABNORMAL HIGH (ref 0–44)
AST: 53 U/L — ABNORMAL HIGH (ref 15–41)
Albumin: 2.8 g/dL — ABNORMAL LOW (ref 3.5–5.0)
Alkaline Phosphatase: 180 U/L — ABNORMAL HIGH (ref 38–126)
Anion gap: 10 (ref 5–15)
BUN: 6 mg/dL — ABNORMAL LOW (ref 8–23)
CO2: 22 mmol/L (ref 22–32)
Calcium: 8.4 mg/dL — ABNORMAL LOW (ref 8.9–10.3)
Chloride: 98 mmol/L (ref 98–111)
Creatinine, Ser: 0.46 mg/dL (ref 0.44–1.00)
GFR, Estimated: >60 mL/min (ref >60)
Glucose, Bld: 62 mg/dL — ABNORMAL LOW (ref 70–99)
Potassium: 3.6 mmol/L (ref 3.5–5.1)
Sodium: 130 mmol/L — ABNORMAL LOW (ref 135–145)
Total Bilirubin: 0.8 mg/dL (ref 0.3–1.2)
Total Protein: 6.8 g/dL (ref 6.5–8.1)

## 2022-04-27 LAB — CBC
HCT: 29.9 % — ABNORMAL LOW (ref 36.0–46.0)
Hemoglobin: 9.7 g/dL — ABNORMAL LOW (ref 12.0–15.0)
MCH: 30.7 pg (ref 26.0–34.0)
MCHC: 32.4 g/dL (ref 30.0–36.0)
MCV: 94.6 fL (ref 80.0–100.0)
Platelets: 318 10*3/uL (ref 150–400)
RBC: 3.16 MIL/uL — ABNORMAL LOW (ref 3.87–5.11)
RDW: 15 % (ref 11.5–15.5)
WBC: 15.5 10*3/uL — ABNORMAL HIGH (ref 4.0–10.5)
nRBC: 0 % (ref 0.0–0.2)

## 2022-04-27 LAB — GLUCOSE, CAPILLARY: Glucose-Capillary: 93 mg/dL (ref 70–99)

## 2022-04-27 LAB — PROTIME-INR
INR: 1.3 — ABNORMAL HIGH (ref 0.8–1.2)
Prothrombin Time: 16 seconds — ABNORMAL HIGH (ref 11.4–15.2)

## 2022-04-27 LAB — APTT: aPTT: 35 seconds (ref 24–36)

## 2022-04-27 SURGERY — ESOPHAGOGASTRODUODENOSCOPY (EGD) WITH PROPOFOL
Anesthesia: General

## 2022-04-27 MED ORDER — PROPOFOL 10 MG/ML IV BOLUS
INTRAVENOUS | Status: DC | PRN
  Administered 2022-04-27: 50 mg via INTRAVENOUS
  Administered 2022-04-27 (×2): 30 mg via INTRAVENOUS
  Administered 2022-04-27: 50 mg via INTRAVENOUS
  Administered 2022-04-27: 100 mg via INTRAVENOUS
  Administered 2022-04-27: 50 mg via INTRAVENOUS

## 2022-04-27 MED ORDER — LACTATED RINGERS IV SOLN: INTRAVENOUS | Status: DC

## 2022-04-27 MED ORDER — LIDOCAINE HCL (CARDIAC) PF 100 MG/5ML IV SOSY
PREFILLED_SYRINGE | INTRAVENOUS | Status: DC | PRN
  Administered 2022-04-27: 50 mg via INTRAVENOUS

## 2022-04-27 MED ORDER — SODIUM CHLORIDE 0.9 % IV SOLN: INTRAVENOUS | Status: DC

## 2022-04-27 NOTE — Progress Notes (Signed)
We will proceed with EGD as scheduled.  I thoroughly discussed with the patient his procedure, including the risks involved. Patient understands what the procedure involves including the benefits and any risks. Patient understands alternatives to the proposed procedure. Risks including (but not limited to) bleeding, tearing of the lining (perforation), rupture of adjacent organs, problems with heart and lung function, infection, and medication reactions. A small percentage of complications may require surgery, hospitalization, repeat endoscopic procedure, and/or transfusion.  Patient understood and agreed.  Ixchel Duck Castaneda, MD Gastroenterology and Hepatology  [facility]  

## 2022-04-27 NOTE — Progress Notes (Addendum)
PROGRESS NOTE     Regina Richardson, is a 61 y.o. female, DOB - Mar 09, 1961, VQM:[id number]  Admit date - 04/25/2022   Admitting Physician Bernadette Hoit, DO  Outpatient Primary MD for the patient is Slocumb Brooklyn, FNP  LOS - 1  Chief Complaint  Patient presents with   Abdominal Pain        Brief Narrative:  61 y.o. female with medical history significant of essential hypertension, GERD, alcoholic cirrhosis, tobacco abuse admitted on 04/26/2022 with abdominal pain nausea and vomiting with imaging studies suggesting duodenitis    -Assessment and Plan: 1)Duodenitis----in an alcoholic female with abdominal pain nausea vomiting -CT abdomen and pelvis consistent with moderate severity duodenitis EGD on 04/27/22 showed-A 10 mm post mucosectomy scar was found in the middle third of the esophagus, 22 cm from the incisors.  Mucous like fluid was found in the entire examined stomach.  Multiple non-bleeding cratered and superficial duodenal ulcers with a clean ulcer base (Forrest Class III) were found in the first portion of the duodenum and in the second portion of the duodenum.  The largest lesion was 30 mm in largest dimension, which was located in the duodenal bulb, there was also significant assocaited edema in the duodenal sweep due to a large 2 cm ulcer. Diffuse mild inflammation characterized by congestion (edema), erythema and mucus was found in the entire duodenum.  -GI service recommends Protonix twice daily -Repeat EGD to be scheduled after pathology results is available -Possible discharge in a.m. if H&H stable and tolerating oral intake  2)Hypokalemia/Hypomagnesemia--- in an alcoholic female with emesis -Replace and recheck  3)Hyponatremia----suspect beer potomania -IV fluids as ordered  4) alcohol abuse----it is unclear when patient had a last alcoholic drink -Lorazepam per CIWA protocol -Folic acid and multivitamin as ordered- --patient declines resources for alcohol  rehab  5)- liver cirrhosis with elevated LFTs---patient with history of alcohol abuse, dyslipidemia and obesity -AST to ALT ratio is not consistent with alcoholic liver disease - viral hepatitis profile negative -lipid profile with LDL of 70, total cholesterol of 117 and HDL of 30 with triglycerides of 85  6)Tobacco abuse--- patient is not interested in smoking cessation  7)Leukocytosis--- suspect reactive in the setting of #1 above  8)HTN--continue metoprolol -IV hydralazine as needed  9) class II obesity- -Low calorie diet, portion control and increase physical activity discussed with patient -Body mass index is 30.08 kg/m.  Disposition/Need for in-Hospital Stay- patient unable to be discharged at this time due to -severe abdominal pain with duodenitis and persistent emesis requiring IV fluids, IV Protonix and IV antiemetics   --Possible discharge in a.m. if H&H stable and tolerating oral intake  Status is: Inpatient   Disposition: The patient is from: Home              Anticipated d/c is to: Home              Anticipated d/c date is: 1 day              Patient currently is not medically stable to d/c. Barriers: Not Clinically Stable-  Code Status :  -  Code Status: Full Code   Family Communication:    NA (patient is alert, awake and coherent)   DVT Prophylaxis  :   - SCDs  enoxaparin (LOVENOX) injection 40 mg Start: 04/28/22 1000 SCDs Start: 04/26/22 9326   Lab Results  Component Value Date   PLT 318 04/27/2022   Inpatient Medications  Scheduled Meds:  diazepam  2 mg Oral TID   [START ON 04/28/2022] enoxaparin (LOVENOX) injection  40 mg Subcutaneous Q24H   feeding supplement  237 mL Oral BID BM   folic acid  1 mg Oral Daily   metoprolol succinate  50 mg Oral Daily   multivitamin with minerals  1 tablet Oral Daily   [START ON 04/29/2022] pantoprazole  40 mg Intravenous Q12H   thiamine  100 mg Oral Daily   Or   thiamine  100 mg Intravenous Daily   Continuous  Infusions:  pantoprazole 8 mg/hr (04/27/22 1153)   PRN Meds:.fentaNYL (SUBLIMAZE) injection, hydrALAZINE, LORazepam **OR** LORazepam, ondansetron (ZOFRAN) IV   Anti-infectives (From admission, onward)    None      Subjective: Regina Richardson today has no fevers, no further emesis,  No chest pain,   - Tolerated EGD well -Hypoglycemic episode noted -Hungry and wanting to eat  Objective: Vitals:   04/27/22 1219 04/27/22 1224 04/27/22 1226 04/27/22 1230  BP: (!) 84/69 (!) 86/56 (!) 97/47 94/75  Pulse: 76 74 75 74  Resp: (!) 25 (!) '24 16 16  '$ Temp: 98.4 F (36.9 C)   98.4 F (36.9 C)  TempSrc:      SpO2: 95% 96% 94% 98%  Weight:      Height:        Intake/Output Summary (Last 24 hours) at 04/27/2022 1629 Last data filed at 04/27/2022 1210 Gross per 24 hour  Intake 400.6 ml  Output 0 ml  Net 400.6 ml   Filed Weights   04/25/22 2134 04/26/22 0301  Weight: 72.6 kg 69.9 kg   Physical Exam  Gen:- Awake Alert, no acute distress HEENT:- [place].AT, No sclera icterus Neck-Supple Neck,No JVD,.  Lungs-  CTAB , fair symmetrical air movement CV- S1, S2 normal, regular  Abd-  +ve B.Sounds, Abd Soft, epigastric and upper quadrant mild tenderness no rebound no guarding Extremity/Skin:- No  edema, pedal pulses present  Psych-affect is appropriate, oriented x3 Neuro-no new focal deficits, no tremors  Data Reviewed: I have personally reviewed following labs and imaging studies  CBC: Recent Labs  Lab 04/25/22 2253 04/26/22 0741 04/27/22 0331  WBC 17.9* 15.9* 15.5*  NEUTROABS 14.5*  --   --   HGB 10.0* 9.9* 9.7*  HCT 30.4* 30.1* 29.9*  MCV 92.1 93.2 94.6  PLT 330 296 294   Basic Metabolic Panel: Recent Labs  Lab 04/25/22 2253 04/26/22 0741 04/26/22 1352 04/27/22 0331  NA 129* 130* 129* 130*  K 3.1* 3.2* 2.9* 3.6  CL 95* 97* 99 98  CO2 '24 24 22 22  '$ GLUCOSE 105* 85 160* 62*  BUN '13 13 10 '$ 6*  CREATININE 0.47 0.48 0.50 0.46  CALCIUM 8.4* 8.2* 8.1* 8.4*  MG  --  1.5*  --    --    GFR: Estimated Creatinine Clearance: 64.5 mL/min (by C-G formula based on SCr of 0.46 mg/dL). Liver Function Tests: Recent Labs  Lab 04/25/22 2253 04/26/22 0741 04/27/22 0331  AST 59* 58* 53*  ALT 52* 55* 49*  ALKPHOS 168* 171* 180*  BILITOT 1.1 0.9 0.8  PROT 7.1 6.8 6.8  ALBUMIN 2.8* 2.7* 2.8*   Radiology Studies: CT ABDOMEN PELVIS W CONTRAST  Result Date: 04/26/2022 CLINICAL DATA:  Abdominal pain. EXAM: CT ABDOMEN AND PELVIS WITH CONTRAST TECHNIQUE: Multidetector CT imaging of the abdomen and pelvis was performed using the standard protocol following bolus administration of intravenous contrast. RADIATION DOSE REDUCTION: This exam was performed according to the departmental dose-optimization program which includes automated  exposure control, adjustment of the mA and/or kV according to patient size and/or use of iterative reconstruction technique. CONTRAST:  148m OMNIPAQUE IOHEXOL 300 MG/ML  SOLN COMPARISON:  April 26, 2020 FINDINGS: Lower chest: No acute abnormality. Hepatobiliary: The liver is cirrhotic in appearance. No focal liver abnormality is seen. No gallstones, gallbladder wall thickening, or biliary dilatation. Pancreas: Unremarkable. No pancreatic ductal dilatation or surrounding inflammatory changes. Spleen: Normal in size without focal abnormality. Adrenals/Urinary Tract: A 2.6 cm diameter low-attenuation (approximately 65.64 Hounsfield units) left adrenal mass is seen. The right adrenal gland is unremarkable. Kidneys are normal, without renal calculi, focal lesion, or hydronephrosis. The urinary bladder is poorly distended and subsequently limited in evaluation. Stomach/Bowel: Stomach is within normal limits. Appendix appears normal. The proximal duodenum is moderately thickened and inflamed with a moderate amount of peri-duodenal inflammatory fat stranding. No evidence of bowel dilatation. Vascular/Lymphatic: Aortic atherosclerosis. 13 mm and 11 mm mesenteric lymph nodes  are seen above the junction of the pancreatic body and head. Reproductive: Status post hysterectomy. No adnexal masses. Other: No abdominal wall hernia or abnormality. No abdominopelvic ascites. Musculoskeletal: No acute or significant osseous findings. IMPRESSION: 1. Moderate severity duodenitis. 2. Hepatic cirrhosis. 3. Stable, benign, left adrenal adenoma. No additional follow-up imaging is recommended. This recommendation follows ACR consensus guidelines: Management of Incidental Adrenal Masses: A White Paper of the ACR Incidental Findings Committee. J Am Coll Radiol 2017;14:1038-1044. 4. Aortic atherosclerosis. Aortic Atherosclerosis (ICD10-I70.0). Electronically Signed   By: TVirgina NorfolkM.D.   On: 04/26/2022 00:17    Scheduled Meds:  diazepam  2 mg Oral TID   [START ON 04/28/2022] enoxaparin (LOVENOX) injection  40 mg Subcutaneous Q24H   feeding supplement  237 mL Oral BID BM   folic acid  1 mg Oral Daily   metoprolol succinate  50 mg Oral Daily   multivitamin with minerals  1 tablet Oral Daily   [START ON 04/29/2022] pantoprazole  40 mg Intravenous Q12H   thiamine  100 mg Oral Daily   Or   thiamine  100 mg Intravenous Daily   Continuous Infusions:  pantoprazole 8 mg/hr (04/27/22 1153)     LOS: 1 day   CRoxan HockeyM.D on 04/27/2022 at 4:29 PM  Go to [email or web address] - for contact info  [facility] - Office  [phone] If 7PM-7AM, please contact night-coverage [email or web address] 04/27/2022, 4:29 PM

## 2022-04-27 NOTE — Anesthesia Postprocedure Evaluation (Signed)
Anesthesia Post Note  Patient: Regina Richardson  Procedure(s) Performed: ESOPHAGOGASTRODUODENOSCOPY (EGD) WITH PROPOFOL BIOPSY  Patient location during evaluation: PACU Anesthesia Type: General Level of consciousness: awake and alert and oriented Pain management: pain level controlled Vital Signs Assessment: post-procedure vital signs reviewed and stable Respiratory status: spontaneous breathing, nonlabored ventilation and respiratory function stable Cardiovascular status: blood pressure returned to baseline and stable Postop Assessment: no apparent nausea or vomiting Anesthetic complications: no   No notable events documented.   Last Vitals:  Vitals:   04/27/22 1226 04/27/22 1230  BP: (!) 97/47 94/75  Pulse: 75 74  Resp: 16 16  Temp:  36.9 C  SpO2: 94% 98%    Last Pain:  Vitals:   04/27/22 1230  TempSrc:   PainSc: 2                  Fady Stamps C Medrith Veillon

## 2022-04-27 NOTE — Care Management Important Message (Signed)
Important Message  Patient Details  Name: Regina Richardson MRN: [id number] Date of Birth: Sep 14, 1960   Medicare Important Message Given:  Yes (copy left on bedside table)     Tommy Medal 04/27/2022, 11:54 AM

## 2022-04-27 NOTE — Anesthesia Preprocedure Evaluation (Signed)
Anesthesia Evaluation  Patient identified by MRN, date of birth, ID band Patient awake    Reviewed: Allergy & Precautions, NPO status , Patient's Chart, lab work & pertinent test results, reviewed documented beta blocker date and time   Airway Mallampati: II  TM Distance: >3 FB Neck ROM: Full    Dental  (+) Dental Advisory Given, Missing   Pulmonary Current Smoker and Patient abstained from smoking.,    Pulmonary exam normal breath sounds clear to auscultation       Cardiovascular Exercise Tolerance: Good hypertension, Pt. on medications and Pt. on home beta blockers Normal cardiovascular exam+ dysrhythmias  Rhythm:Regular Rate:Normal     Neuro/Psych PSYCHIATRIC DISORDERS Anxiety Depression negative neurological ROS     GI/Hepatic GERD  Medicated and Poorly Controlled,(+) Cirrhosis     substance abuse  alcohol use,   Endo/Other  negative endocrine ROS  Renal/GU negative Renal ROS  Female GU complaint (cervical cancer)     Musculoskeletal  (+) Arthritis ,   Abdominal   Peds negative pediatric ROS (+)  Hematology negative hematology ROS (+)   Anesthesia Other Findings   Reproductive/Obstetrics negative OB ROS                            Anesthesia Physical Anesthesia Plan  ASA: 3  Anesthesia Plan: General   Post-op Pain Management: Minimal or no pain anticipated   Induction: Intravenous  PONV Risk Score and Plan: Propofol infusion  Airway Management Planned: Nasal Cannula and Natural Airway  Additional Equipment:   Intra-op Plan:   Post-operative Plan:   Informed Consent: I have reviewed the patients History and Physical, chart, labs and discussed the procedure including the risks, benefits and alternatives for the proposed anesthesia with the patient or authorized representative who has indicated his/her understanding and acceptance.     Dental advisory given  Plan  Discussed with: CRNA and Surgeon  Anesthesia Plan Comments:         Anesthesia Quick Evaluation

## 2022-04-27 NOTE — Transfer of Care (Signed)
Immediate Anesthesia Transfer of Care Note  Patient: Regina Richardson  Procedure(s) Performed: ESOPHAGOGASTRODUODENOSCOPY (EGD) WITH PROPOFOL BIOPSY  Patient Location: PACU  Anesthesia Type:General  Level of Consciousness: drowsy  Airway & Oxygen Therapy: Patient Spontanous Breathing  Post-op Assessment: Report given to RN and Post -op Vital signs reviewed and stable  Post vital signs: Reviewed and stable  Last Vitals:  Vitals Value Taken Time  BP 84/69   Temp 98.4   Pulse 76 04/27/22 1220  Resp 25 04/27/22 1220  SpO2 96 % 04/27/22 1220  Vitals shown include unvalidated device data.  Last Pain:  Vitals:   04/27/22 1154  TempSrc:   PainSc: 0-No pain      Patients Stated Pain Goal: 0 (24/11/46 4314)  Complications: No notable events documented.

## 2022-04-27 NOTE — Op Note (Signed)
[facility] Patient Name: Regina Richardson Procedure Date: 04/27/2022 11:49 AM MRN: [id number] Date of Birth: Dec 16, 1960 Attending MD: Maylon Peppers ,  CSN: [id number] Age: 61 Admit Type: Outpatient Procedure:                Upper GI endoscopy Indications:              Epigastric abdominal pain, Abnormal CT of the GI                            tract Providers:                Maylon Peppers, Rosina Lowenstein, RN, Everardo Pacific Referring MD:              Medicines:                Monitored Anesthesia Care Complications:            No immediate complications. Estimated Blood Loss:     Estimated blood loss: none. Procedure:                Pre-Anesthesia Assessment:                           - Prior to the procedure, a History and Physical                            was performed, and patient medications, allergies                            and sensitivities were reviewed. The patient's                            tolerance of previous anesthesia was reviewed.                           - The risks and benefits of the procedure and the                            sedation options and risks were discussed with the                            patient. All questions were answered and informed                            consent was obtained.                           - ASA Grade Assessment: III - A patient with severe                            systemic disease.                           After obtaining informed consent, the endoscope was                            passed under direct vision. Throughout  the                            procedure, the patient's blood pressure, pulse, and                            oxygen saturations were monitored continuously. The                            GIF-H190 ([id number]) scope was introduced through the                            mouth, and advanced to the second part of duodenum.                            The upper GI endoscopy was accomplished without                             difficulty. The patient tolerated the procedure                            well. Scope In: 11:59:32 AM Scope Out: 23:55:73 PM Total Procedure Duration: 0 hours 16 minutes 35 seconds  Findings:      A 10 mm post mucosectomy scar was found in the middle third of the       esophagus, 22 cm from the incisors. The scar tissue was healthy in       appearance. Imaging was performed using white light and narrow band       imaging to visualize the mucosa.      A single 5 mm mucosal nodule was found in the middle third of the       esophagus, 25 cm from the incisors. Imaging was performed using white       light and narrow band imaging to visualize the mucosa. Biopsies were       taken with a cold forceps for histology.      Mucous like fluid was found in the entire examined stomach. Fluid       aspiration was performed. No other abnormalities were seen in the rest       of the stomach, folds had normal appearance. Biopsies were taken with a       cold forceps for Helicobacter pylori testing.      Multiple non-bleeding cratered and superficial duodenal ulcers with a       clean ulcer base (Forrest Class III) were found in the first portion of       the duodenum and in the second portion of the duodenum. The largest       lesion was 30 mm in largest dimension, which was located in the duodenal       bulb, there was also significant assocaited edema in the duodenal sweep       due to a large 2 cm ulcer..      Diffuse mild inflammation characterized by congestion (edema), erythema       and mucus was found in the entire duodenum. Impression:               - Scar in the middle third of the esophagus.                           -  Mucosal nodule found in the esophagus. Biopsied.                           - Mucous like gastric fluid. Fluid aspiration                            performed. Normal stomach biopsied.                           - Multiple non-bleeding duodenal ulcers  with a                            clean ulcer base (Forrest Class III).                           - Duodenitis Moderate Sedation:      Per Anesthesia Care Recommendation:           - Return patient to hospital ward for ongoing care.                           - Soft diet.                           - Pantoprazole 40 mg PO twice a day.                           - Await pathology results.                           - Sucralfate 1 g TID for one month.                           - Timeframe to repeat EGD will depend on pathology                            results - I explained to the patient that given her                            type of cancer (SCC), she may need to be evaluated                            again at [facility].                           - Check H. pylori serology and AM gastrin given                            multiple ulcers and duodenitis. Procedure Code(s):        --- Professional ---                           [phone], Esophagogastroduodenoscopy, flexible,                            transoral; with biopsy, single  or multiple Diagnosis Code(s):        --- Professional ---                           K22.8, Other specified diseases of esophagus                           K26.9, Duodenal ulcer, unspecified as acute or                            chronic, without hemorrhage or perforation                           R10.13, Epigastric pain                           R93.3, Abnormal findings on diagnostic imaging of                            other parts of digestive tract CPT copyright 2019 American Medical Association. All rights reserved. The codes documented in this report are preliminary and upon coder review may  be revised to meet current compliance requirements. Maylon Peppers, MD Maylon Peppers,  04/27/2022 12:46:13 PM This report has been signed electronically. Number of Addenda: 0

## 2022-04-27 NOTE — Progress Notes (Signed)
TOC provided pt with [place] List in response to SDOH screening.

## 2022-04-27 NOTE — Progress Notes (Signed)
Spoke with patient this morning. NPO for procedure. Discussed again risks and benefits of EGD. Stated understanding. No questions.   Annitta Needs, PhD, ANP-BC [facility] Gastroenterology

## 2022-04-27 NOTE — Brief Op Note (Signed)
04/25/2022 - 04/27/2022  12:40 PM  PATIENT:  Regina Richardson  61 y.o. female  PRE-OPERATIVE DIAGNOSIS:  abdominal pain, vomiting, duodenitis  POST-OPERATIVE DIAGNOSIS:  multiple small bowel ulcers , duodenitis,scar at 22 cm in esophagus  PROCEDURE:  Procedure(s): ESOPHAGOGASTRODUODENOSCOPY (EGD) WITH PROPOFOL (N/A) BIOPSY  SURGEON:  Surgeon(s) and Role:    * Harvel Quale, MD - Primary  Patient underwent EGD under propofol sedation.  Tolerated the procedure adequately.  A 10 mm post mucosectomy scar was found in the middle third of the esophagus, 22 cm from the incisors.  The scar tissue was healthy in appearance.  Imaging was performed using white light and narrow band imaging to visualize the mucosa. A single 5 mm mucosal nodule was found in the middle third of the esophagus, 25 cm from the incisors.  Imaging was performed using white light and narrow band imaging to visualize the mucosa.  Biopsies were taken with a cold forceps for histology. Mucous like fluid was found in the entire examined stomach.  Fluid aspiration was performed. No other abnormalities were seen in the rest of the stomach, folds had normal appearance. Biopsies were taken with a cold forceps for Helicobacter pylori testing. Multiple non-bleeding cratered and superficial duodenal ulcers with a clean ulcer base (Forrest Class III) were found in the first portion of the duodenum and in the second portion of the duodenum.  The largest lesion was 30 mm in largest dimension, which was located in the duodenal bulb, there was also significant assocaited edema in the duodenal sweep due to a large 2 cm ulcer. Diffuse mild inflammation characterized by congestion (edema), erythema and mucus was found in the entire duodenum.   RECOMMENDATIONS - Return patient to hospital ward for ongoing care.  - Soft diet.  - Pantoprazole 40 mg PO twice a day. - Await pathology results.  - Sucralfate 1 g TID for one month. - Timeframe to  repeat EGD will depend on pathology results - I explained to the patient that given her type of cancer (SCC), she may need to be evaluated [facility] at [facility]. - Check H. pylori serology and AM gastrin given multiple ulcers and duodenitis.  Maylon Peppers, MD Gastroenterology and Hepatology [facility] Gastroenterology

## 2022-04-28 ENCOUNTER — Telehealth (INDEPENDENT_AMBULATORY_CARE_PROVIDER_SITE_OTHER): Payer: Self-pay | Admitting: Gastroenterology

## 2022-04-28 DIAGNOSIS — C159 Malignant neoplasm of esophagus, unspecified: Secondary | ICD-10-CM

## 2022-04-28 DIAGNOSIS — K260 Acute duodenal ulcer with hemorrhage: Secondary | ICD-10-CM

## 2022-04-28 LAB — COMPREHENSIVE METABOLIC PANEL
ALT: 46 U/L — ABNORMAL HIGH (ref 0–44)
AST: 48 U/L — ABNORMAL HIGH (ref 15–41)
Albumin: 2.7 g/dL — ABNORMAL LOW (ref 3.5–5.0)
Alkaline Phosphatase: 180 U/L — ABNORMAL HIGH (ref 38–126)
Anion gap: 9 (ref 5–15)
BUN: 7 mg/dL — ABNORMAL LOW (ref 8–23)
CO2: 25 mmol/L (ref 22–32)
Calcium: 8.5 mg/dL — ABNORMAL LOW (ref 8.9–10.3)
Chloride: 97 mmol/L — ABNORMAL LOW (ref 98–111)
Creatinine, Ser: 0.52 mg/dL (ref 0.44–1.00)
GFR, Estimated: >60 mL/min (ref >60)
Glucose, Bld: 84 mg/dL (ref 70–99)
Potassium: 3 mmol/L — ABNORMAL LOW (ref 3.5–5.1)
Sodium: 131 mmol/L — ABNORMAL LOW (ref 135–145)
Total Bilirubin: 0.7 mg/dL (ref 0.3–1.2)
Total Protein: 6.8 g/dL (ref 6.5–8.1)

## 2022-04-28 LAB — CBC
HCT: 26.4 % — ABNORMAL LOW (ref 36.0–46.0)
Hemoglobin: 8.7 g/dL — ABNORMAL LOW (ref 12.0–15.0)
MCH: 30.3 pg (ref 26.0–34.0)
MCHC: 33 g/dL (ref 30.0–36.0)
MCV: 92 fL (ref 80.0–100.0)
Platelets: 319 10*3/uL (ref 150–400)
RBC: 2.87 MIL/uL — ABNORMAL LOW (ref 3.87–5.11)
RDW: 14.8 % (ref 11.5–15.5)
WBC: 12.5 10*3/uL — ABNORMAL HIGH (ref 4.0–10.5)
nRBC: 0 % (ref 0.0–0.2)

## 2022-04-28 LAB — GLUCOSE, CAPILLARY
Glucose-Capillary: 126 mg/dL — ABNORMAL HIGH (ref 70–99)
Glucose-Capillary: 83 mg/dL (ref 70–99)

## 2022-04-28 MED ORDER — PANTOPRAZOLE SODIUM 40 MG PO TBEC: 40.0000 mg | DELAYED_RELEASE_TABLET | Freq: Two times a day (BID) | ORAL | 2 refills | Status: DC

## 2022-04-28 MED ORDER — BUPROPION HCL ER (SR) 150 MG PO TB12: 150.0000 mg | ORAL_TABLET | Freq: Two times a day (BID) | ORAL | 2 refills | Status: DC

## 2022-04-28 MED ORDER — FOLIC ACID 1 MG PO TABS: 1.0000 mg | ORAL_TABLET | Freq: Every day | ORAL | 3 refills | Status: DC

## 2022-04-28 MED ORDER — POTASSIUM CHLORIDE CRYS ER 20 MEQ PO TBCR
40.0000 meq | EXTENDED_RELEASE_TABLET | ORAL | Status: AC
  Administered 2022-04-28 (×2): 40 meq via ORAL
  Filled 2022-04-28 (×2): qty 2

## 2022-04-28 MED ORDER — METOPROLOL SUCCINATE ER 50 MG PO TB24: 50.0000 mg | ORAL_TABLET | Freq: Every day | ORAL | 3 refills | Status: DC

## 2022-04-28 NOTE — Discharge Instructions (Signed)
1)Avoid ibuprofen/Advil/Aleve/Motrin/Goody Powders/Naproxen/BC powders/Meloxicam/Diclofenac/Indomethacin and other Nonsteroidal anti-inflammatory medications as these will make you more likely to bleed and can cause stomach ulcers, can also cause Kidney problems.  Stop Diclofenac  2) Please avoid coffee/caffeinated beverages, Tea, chocolate, carbonated drinks/soda,  spicy food, Milk,  Alcohol, acidic foods- such as citrus (Lemon, oranges), juices  and tomatoes, Meats with a high fat content. Please Avoid High-fat condiments and  Fried foods  3)Please Follow up with Gastroenterologist Dr. Jenetta Downer-- in 4 weeks for recheck and reevaluation especially given your history of esophageal cancer as well as history of alcoholic liver cirrhosis and duodenal peptic ulcers -address [address], [address], [place] [address],,POLID Number [phone]     4)follow up with Bua Brooklyn, FNP (your primary care physician) for repeat CBC and CMP blood test within a week  5) complete abstinence from alcohol and tobacco strongly advised

## 2022-04-28 NOTE — Telephone Encounter (Signed)
Hi Mitzie,  Can you please schedule a follow up appointment for this patient in 2-3 weeks with Dr. Abbey Chatters or one of the APPs?  Thanks,  Maylon Peppers, MD Gastroenterology and Hepatology [facility] Gastroenter[facility]

## 2022-04-28 NOTE — Progress Notes (Signed)
Regina Richardson, M.D. Gastroenterology & Hepatology   Interval History:  No acute events overnight. Patient reports feeling well and states that her abdominal pain has much more subsided, only has some throbbing abdominal discomfort occasionally.  States her stool was dark but no melena, no hematochezia. Notably, her hemoglobin went down to 8.7 but her white blood cell count also decreased to 12.5.  BUN has remained low at 7.  Potassium was 3.0 and sodium was 131.  AST and ALT have slowly decreased.  Inpatient Medications:  Current Facility-Administered Medications:    enoxaparin (LOVENOX) injection 40 mg, 40 mg, Subcutaneous, Q24H, Mahala Menghini, PA-C, 40 mg at 04/28/22 1012   feeding supplement (ENSURE ENLIVE / ENSURE PLUS) liquid 237 mL, 237 mL, Oral, BID BM, Adefeso, Oladapo, DO, 237 mL at 04/28/22 1008   fentaNYL (SUBLIMAZE) injection 50 mcg, 50 mcg, Intravenous, Q2H PRN, Denton Brick, Courage, MD   folic acid (FOLVITE) tablet 1 mg, 1 mg, Oral, Daily, Emokpae, Courage, MD, 1 mg at 04/28/22 1006   hydrALAZINE (APRESOLINE) injection 10 mg, 10 mg, Intravenous, Q6H PRN, Emokpae, Courage, MD   LORazepam (ATIVAN) tablet 1-4 mg, 1-4 mg, Oral, Q1H PRN **OR** LORazepam (ATIVAN) injection 1-4 mg, 1-4 mg, Intravenous, Q1H PRN, Denton Brick, Courage, MD   metoprolol succinate (TOPROL-XL) 24 hr tablet 50 mg, 50 mg, Oral, Daily, Adefeso, Oladapo, DO, 50 mg at 04/28/22 1010   multivitamin with minerals tablet 1 tablet, 1 tablet, Oral, Daily, Emokpae, Courage, MD, 1 tablet at 04/28/22 1006   ondansetron (ZOFRAN) injection 4 mg, 4 mg, Intravenous, Q6H PRN, Adefeso, Oladapo, DO   [START ON 04/29/2022] pantoprazole (PROTONIX) injection 40 mg, 40 mg, Intravenous, Q12H, Adefeso, Oladapo, DO   pantoprozole (PROTONIX) 80 mg /NS 100 mL infusion, 8 mg/hr, Intravenous, Continuous, Adefeso, Oladapo, DO, Last Rate: 10 mL/hr at 04/28/22 0643, 8 mg/hr at 04/28/22 0643   potassium chloride SA (KLOR-CON M) CR tablet 40 mEq, 40  mEq, Oral, Q3H, Denton Brick, Courage, MD, 40 mEq at 04/28/22 1004   thiamine (VITAMIN B1) tablet 100 mg, 100 mg, Oral, Daily, 100 mg at 04/28/22 1005 **OR** thiamine (VITAMIN B1) injection 100 mg, 100 mg, Intravenous, Daily, Emokpae, Courage, MD   I/O    Intake/Output Summary (Last 24 hours) at 04/28/2022 1121 Last data filed at 04/28/2022 0900 Gross per 24 hour  Intake 1160 ml  Output 0 ml  Net 1160 ml     Physical Exam: Temp:  [97.2 F (36.2 C)-98.4 F (36.9 C)] 97.6 F (36.4 C) (10/07 0420) Pulse Rate:  [74-93] 93 (10/07 0420) Resp:  [16-25] 16 (10/07 0420) BP: (84-160)/(47-95) 160/66 (10/07 0420) SpO2:  [94 %-99 %] 99 % (10/07 0420)  Temp (24hrs), Avg:97.9 F (36.6 C), Min:97.2 F (36.2 C), Max:98.4 F (36.9 C) GENERAL: The patient is AO x3, in no acute distress.  Obese. HEENT: Head is normocephalic and atraumatic. EOMI are intact. Mouth is well hydrated and without lesions. NECK: Supple. No masses LUNGS: Clear to auscultation. No presence of rhonchi/wheezing/rales. Adequate chest expansion HEART: RRR, normal s1 and s2. ABDOMEN: Soft, nontender, no guarding, no peritoneal signs, and nondistended. BS +. No masses. EXTREMITIES: Without any cyanosis, clubbing, rash, lesions or edema.  Has extensive bruising in upper extremities. NEUROLOGIC: AOx3, no focal motor deficit. SKIN: no jaundice, no rashes  Laboratory Data: CBC:     Component Value Date/Time   WBC 12.5 (H) 04/28/2022 0651   RBC 2.87 (L) 04/28/2022 0651   HGB 8.7 (L) 04/28/2022 0651   HGB 10.4 (L) 04/10/2022  1046   HCT 26.4 (L) 04/28/2022 0651   HCT 31.8 (L) 04/10/2022 1046   PLT 319 04/28/2022 0651   PLT 298 04/10/2022 1046   MCV 92.0 04/28/2022 0651   MCV 91 04/10/2022 1046   MCH 30.3 04/28/2022 0651   MCHC 33.0 04/28/2022 0651   RDW 14.8 04/28/2022 0651   RDW 13.2 04/10/2022 1046   LYMPHSABS 1.6 04/25/2022 2253   LYMPHSABS 2.4 04/10/2022 1046   MONOABS 1.5 (H) 04/25/2022 2253   EOSABS 0.0 04/25/2022 2253    EOSABS 0.2 04/10/2022 1046   BASOSABS 0.1 04/25/2022 2253   BASOSABS 0.2 04/10/2022 1046   COAG:  Lab Results  Component Value Date   INR 1.3 (H) 04/27/2022   INR 1.2 (H) 12/01/2020   INR 1.2 06/14/2020    BMP:     Latest Ref Rng & Units 04/28/2022    6:51 AM 04/27/2022    3:31 AM 04/26/2022    1:52 PM  BMP  Glucose 70 - 99 mg/dL 84  62  160   BUN 8 - 23 mg/dL _0 Creatinine 0.44 - 1.00 mg/dL 0.52  0.46  0.50   Sodium 135 - 145 mmol/L 131  130  129   Potassium 3.5 - 5.1 mmol/L 3.0  3.6  2.9   Chloride 98 - 111 mmol/L 97  98  99   CO2 22 - 32 mmol/L _1 Calcium 8.9 - 10.3 mg/dL 8.5  8.4  8.1     HEPATIC:     Latest Ref Rng & Units 04/28/2022    6:51 AM 04/27/2022    3:31 AM 04/26/2022    7:41 AM  Hepatic Function  Total Protein 6.5 - 8.1 g/dL 6.8  6.8  6.8   Albumin 3.5 - 5.0 g/dL 2.7  2.8  2.7   AST 15 - 41 U/L 48  53  58   ALT 0 - 44 U/L 46  49  55   Alk Phosphatase 38 - 126 U/L 180  180  171   Total Bilirubin 0.3 - 1.2 mg/dL 0.7  0.8  0.9     CARDIAC:  Lab Results  Component Value Date   CKTOTAL 52 01/17/2017      Imaging: I personally reviewed and interpreted the available labs, imaging and endoscopic files.   Assessment/Plan: 61 year old female with past medical history of hypertension, GERD, alcoholic cirrhosis, squamous cell carcinoma of the esophagus status post endoscopic submucosal dissection, who came to the hospital after presenting for 1 week abdominal pain, vomiting and discomfort.  Patient was found to have changes on CT scan concerning for moderate to severe duodenitis as well as mild elevation of aminotransferases and leukocytosis.  Due to this the patient underwent an EGD yesterday showing multiple cratered and superficial duodenal ulcers with largest of 30 mm but without stigmata of recent bleeding, there was significant associated edema in the duodenal sweep, gastric biopsies were obtained.  Notably, inspection of the esophagus showed  a normal scar but there was a duodenal nodule which was sampled.  Patient has presented improvement of her symptoms after being started on pantoprazole 40 mg twice a day and Carafate.  She has tolerated diet adequately and has not presented any overt gastrointestinal bleeding although she had a mild drop in her hemoglobin today.  Gastrin result is pending (this was obtained given the presence of multiple ulcers in her duodenum).  She denies taking any NSAIDs or  high-dose aspirin compounds.  We will reorder H. pylori serology as this order was discontinued twice.  We will also need to follow results of gastric biopsies and treat H. pylori if possible staining.  Timeframe of repeat EGD will depend on pathology report.  Finally, I strongly advised the patient to avoid alcohol consumption as this has led to cirrhosis and episode of mild alcoholic hepatitis.  She understood and agreed.  - Soft diet.  - Pantoprazole 40 mg PO twice a day. -Follow-up pathology results.  - Sucralfate 1 g TID for one month. - Timeframe to repeat EGD will depend on pathology results - I explained to the patient that given her type of cancer (SCC), she may need to be evaluated again at [facility]. -Follow-up H. pylori serology and AM gastrin given multiple ulcers and duodenitis. -Alcohol cessation - Patient will follow up in GI clinic in 2-3 weeks.  Regina Peppers, MD Gastroenterology and Hepatology [facility]

## 2022-04-28 NOTE — Progress Notes (Signed)
Patient being discharged home, Brandie RN reviewed discharge paper work. Patient wheeled out via wheelchair by NT.

## 2022-04-28 NOTE — Discharge Summary (Signed)
Regina Richardson, is a 61 y.o. female  DOB 1961-03-04  MRN [id number].  Admission date:  04/25/2022  Admitting Physician  Bernadette Hoit, DO  Discharge Date:  04/28/2022   Primary MD  Laton Brooklyn, FNP  Recommendations for primary care physician for things to follow:   1)Avoid ibuprofen/Advil/Aleve/Motrin/Goody Powders/Naproxen/BC powders/Meloxicam/Diclofenac/Indomethacin and other Nonsteroidal anti-inflammatory medications as these will make you more likely to bleed and can cause stomach ulcers, can also cause Kidney problems.  Stop Diclofenac  2) Please avoid coffee/caffeinated beverages, Tea, chocolate, carbonated drinks/soda,  spicy food, Milk,  Alcohol, acidic foods- such as citrus (Lemon, oranges), juices  and tomatoes, Meats with a high fat content. Please Avoid High-fat condiments and  Fried foods  3)Please Follow up with Gastroenterologist Dr. Jenetta Downer-- in 4 weeks for recheck and reevaluation especially given your history of esophageal cancer as well as history of alcoholic liver cirrhosis and duodenal peptic ulcers -address [address], [place] [address],,PVVZS Number [phone]     4)follow up with Majka Brooklyn, FNP (your primary care physician) for repeat CBC and CMP blood test within a week  5) complete abstinence from alcohol and tobacco strongly advised  Admission Diagnosis  Abdominal pain [R10.9]   Discharge Diagnosis  Abdominal pain [R10.9]    Principal Problem:   Acute duodenal ulcer with bleeding/Duodenitis Active Problems:   Hepatic cirrhosis ([facility])   Squamous cell carcinoma of esophagus/Moderate to severe squamous dysplasia.- March 2022   Essential hypertension   Smoker   Gastro-esophageal reflux disease without esophagitis   Elevated LFTs   Alcohol abuse   Hyponatremia   Alcoholic cirrhosis ([facility])   Abdominal pain   Nausea & vomiting    Hypokalemia   Hypoalbuminemia due to protein-calorie malnutrition ([facility])   Transaminitis   Obesity (BMI 30-39.9)      Past Medical History:  Diagnosis Date   Alcoholism ([place])    Anxiety    Arrhythmia    heart   Arthritis    Cancer ([facility])    cervical cancer in the 80s   Cirrhosis ([facility]) 49/2010   alcoholic;    Depression    Dysrhythmia    GERD (gastroesophageal reflux disease)    Hypertension    Osteoporosis    Scoliosis    Urinary incontinence     Past Surgical History:  Procedure Laterality Date   BIOPSY  10/11/2020   Procedure: BIOPSY;  Surgeon: Eloise Harman, DO;  [facility];  Service: Endoscopy;;   BIOPSY  12/12/2020   Procedure: BIOPSY;  Surgeon: Irving Copas., MD;  Location: [facility];  Service: Gastroenterology;;   CERVICAL BIOPSY  W/ LOOP ELECTRODE EXCISION     CESAREAN SECTION     COLONOSCOPY  08/2017   Dr. Fuller Plan;  prominent and moderately lipomatous ileocecal valve, 9 mm sessile polyp in hepatic flexure, 8 mm sessile polyp in the descending colon, otherwise normal exam.  Pathology with sessile serrated polyp and hyperplastic polyp.  Recommended repeat colonoscopy in 5 years.  COLONOSCOPY N/A 06/15/2020   Procedure: COLONOSCOPY;  Surgeon: Rogene Houston, MD; 2 polyps resected and retrieved, 1 small polyp in the distal sigmoid biopsy, external hemorrhoids.  Pathology with 1 tubular adenoma and 1 hyperplastic polyp.    COLPOSCOPY     EGD with mucosal resection  03/16/2021   [facility]: 2 large plaques in the proximal-mid third of the esophagus s/p endoscopic submucosal resection and retrieval, placement of 4 MR conditional clips.  Normal stomach, normal examined duodenum.  Pathology revealed squamous cell carcinoma in situ focally extending to the peripheral inked margin.  No invasive carcinoma. Recommended repeat EGD in 3 weeks.   ESOPHAGEAL DILATION N/A 06/15/2020   Procedure: ESOPHAGEAL DILATION;  Surgeon: Rogene Houston, MD;  Location: [facility];  Service: Endoscopy;  Laterality: N/A;   ESOPHAGOGASTRODUODENOSCOPY  08/2017   Dr. Fuller Plan; LA grade B esophagitis without bleeding s/p biopsied, diffuse moderate inflammation and granularity in the entire examined stomach s/p biopsied, normal examined duodenum s/p biopsy. Gastric biopsy with chronic inactive gastritis without H. pylori, esophageal biopsy with mildly inflamed squamous mucosa, duodenal biopsy benign.   ESOPHAGOGASTRODUODENOSCOPY N/A 06/15/2020   Procedure: ESOPHAGOGASTRODUODENOSCOPY (EGD);  Surgeon: Rogene Houston, MD;  Proximal esophageal web, focal esophagitis at proximal esophagus s/p biopsy, benign-appearing esophageal stenosis s/p dilated, portal hypertensive gastropathy.  Esophageal biopsy with moderate squamous dysplasia.     ESOPHAGOGASTRODUODENOSCOPY (EGD) WITH PROPOFOL N/A 10/11/2020   Surgeon: Eloise Harman, DO; Eroded, inflamed mucosa in the esophagus. Biopsied. Portal hypertensive gastropathy.  Recommended PPI twice daily.  Pathology with moderate to severe squamous dysplasia.   ESOPHAGOGASTRODUODENOSCOPY (EGD) WITH PROPOFOL N/A 12/12/2020   Surgeon: Irving Copas., MD; Dionisio David, granular, mucosa in the esophagus at 23-26 cm.  Lesion 3 cm in length and encompassed 30-40% of esophageal wall s/p quick biopsy.  Erythematous mucosa in the stomach biopsied, portal hypertensive gastropathy.  Gastric biopsy with mild chronic inflammation, negative for H. pylori.  Esophageal biopsy with moderate to severe squamous dysplasia.   FINGER SURGERY     HEMORRHOID SURGERY     left collar bone surgery     had fx, pin placed and then complitcations caused them to remove most of the bone   left forearm surgery     from left elbow down   TOTAL ABDOMINAL HYSTERECTOMY     still has ovaries   UPPER ESOPHAGEAL ENDOSCOPIC ULTRASOUND (EUS) N/A 12/12/2020   Surgeon: Irving Copas., MD;without esophageal wall thickening, 1 lymph node in the middle paraesophageal  mediastinum that was suggestive of benign inflammatory changes, pancreatic lobularity in pancreatic body, queried possibility of chronic pancreatitis though complete EUS not performed and no history of pancreatitis.     HPI  from the history and physical done on the day of admission:    HPI: Regina Richardson is a 61 y.o. female with medical history significant of essential hypertension, GERD, alcoholic cirrhosis, tobacco abuse who presents emergency department due to 1 week onset of abdominal pain, nausea, vomiting.  Abdominal pain was generalized and she has been having difficulty in being able to keep anything down.  Ultrasound done on Thursday (9/28) was consistent with cirrhosis and mild cholelithiasis and sludge.  She endorsed stopping alcohol about 2 weeks ago, she denies chest pain, shortness of breath, fever, chills, blood in stool, vomiting of blood.   ED Course:  In the emergency department, she was hemodynamically stable, BP was 160/70 and other vital signs are within normal range.  Work-up in the ED showed  leukocytosis and normocytic anemia.  BMP showed hyponatremia, hypokalemia, transaminitis, albumin 2.8.  Lipase 30, urinalysis was unimpressive for UTI. CT abdomen and pelvis with contrast showed: 1. Moderate severity duodenitis. 2. Hepatic cirrhosis. 3. Stable, benign, left adrenal adenoma. No additional follow-up imaging is recommended 4.  Aortic atherosclerosis Patient was treated with Dilaudid, Zofran, Protonix and IV hydration was provided.  Hospitalist was asked to admit patient for further evaluation and management.   Review of Systems: Review of systems as noted in the HPI. All other systems reviewed and are negative.    Hospital Course:   Brief Narrative:  61 y.o. female with medical history significant of essential hypertension, GERD, alcoholic cirrhosis, tobacco abuse admitted on 04/26/2022 with abdominal pain nausea and vomiting with imaging studies suggesting duodenitis      -Assessment and Plan: 1) duodenal ulcers with acute duodenitis----in an alcoholic female with abdominal pain nausea vomiting -CT abdomen and pelvis consistent with moderate severity duodenitis EGD on 04/27/22 showed-A 10 mm post mucosectomy scar was found in the middle third of the esophagus, 22 cm from the incisors.  Mucous like fluid was found in the entire examined stomach.  Multiple non-bleeding cratered and superficial duodenal ulcers with a clean ulcer base (Forrest Class III) were found in the first portion of the duodenum and in the second portion of the duodenum.  The largest lesion was 30 mm in largest dimension, which was located in the duodenal bulb, there was also significant assocaited edema in the duodenal sweep due to a large 2 cm ulcer. Diffuse mild inflammation characterized by congestion (edema), erythema and mucus was found in the entire duodenum.  -GI service recommends Protonix twice daily -Repeat EGD to be scheduled after pathology results is available -Hemoglobin currently down to 8.7 suspect some component of hemodilution -No further GI bleed concerns at this time -Patient is advised to stop diclofenac, avoid NSAIDs overall -Also advised to abstain from alcohol and tobacco   2)Hypokalemia/Hypomagnesemia--- in an alcoholic female with emesis -Replaced   3)Hyponatremia----suspect beer potomania related -Patient with history of chronic hyponatremia in the setting of chronic/ongoing alcohol use -Sodium is up to 131 from 129   4) alcohol abuse----it is unclear when patient had a last alcoholic drink -Patient received benzos prophylactically to prevent DTs -No DTs at this time -Abstinence from alcohol advised --patient declines resources for alcohol rehab   5)- liver cirrhosis with elevated LFTs---patient with history of alcohol abuse, dyslipidemia and obesity -AST to ALT ratio is not consistent with alcoholic liver disease - viral hepatitis profile negative -lipid  profile with LDL of 70, total cholesterol of 117 and HDL of 30 with triglycerides of 85 -Outpatient follow-up with gastroenterology as advised   6)Tobacco abuse--- patient is not interested in smoking cessation   7)Leukocytosis--- suspect reactive in the setting of #1 above, WBC trending down from 17.9-12.5   8)HTN--continue metoprolol   9) class II obesity- -Low calorie diet, portion control and increase physical activity discussed with patient -Body mass index is 30.08 kg/m.  10)H/o Squamous cell carcinoma of esophagus/Moderate to severe squamous dysplasia.-Diagnosed in March 2022 -GD on 04/27/22 showed-A 10 mm post mucosectomy scar was found in the middle third of the esophagus, 22 cm from the incisors. -Abstinence from tobacco, and alcohol advised -Outpatient follow-up with GI advised   Disposition: The patient is from: Home              Anticipated d/c is to: Home  Discharge Condition: stable  Follow UP  Follow-up Information     Montez Morita, Quillian Quince, MD. Schedule an appointment as soon as possible for a visit in 1 month(s).   Specialty: Gastroenterology Contact information: [address] [place] [address] [phone]         Goldsmith Brooklyn, FNP Follow up in 1 week(s).   Specialty: Family Medicine Why: Repeat CBC and CMP Contact information: [place] [address] [phone]                 Consults obtained -GI  Diet and Activity recommendation:  As advised  Discharge Instructions    Discharge Instructions     Call MD for:  difficulty breathing, headache or visual disturbances   Complete by: As directed    Call MD for:  persistant dizziness or light-headedness   Complete by: As directed    Call MD for:  persistant nausea and vomiting   Complete by: As directed    Call MD for:  temperature >100.4   Complete by: As directed    Diet - low sodium heart healthy   Complete by: As directed    Discharge  instructions   Complete by: As directed    1)Avoid ibuprofen/Advil/Aleve/Motrin/Goody Powders/Naproxen/BC powders/Meloxicam/Diclofenac/Indomethacin and other Nonsteroidal anti-inflammatory medications as these will make you more likely to bleed and can cause stomach ulcers, can also cause Kidney problems.  Stop Diclofenac  2) Please avoid coffee/caffeinated beverages, Tea, chocolate, carbonated drinks/soda,  spicy food, Milk,  Alcohol, acidic foods- such as citrus (Lemon, oranges), juices  and tomatoes, Meats with a high fat content. Please Avoid High-fat condiments and  Fried foods  3)Please Follow up with Gastroenterologist Dr. Jenetta Downer-- in 4 weeks for recheck and reevaluation especially given your history of esophageal cancer as well as history of alcoholic liver cirrhosis and duodenal peptic ulcers -address [address], [address], [place] [address],,CNOBS Number [phone]     4)follow up with Tamburri Brooklyn, FNP (your primary care physician) for repeat CBC and CMP blood test within a week  5) complete abstinence from alcohol and tobacco strongly advised   Increase activity slowly   Complete by: As directed          Discharge Medications     Allergies as of 04/28/2022   No Known Allergies      Medication List     STOP taking these medications    diclofenac 75 MG EC tablet Commonly known as: VOLTAREN   traZODone 100 MG tablet Commonly known as: DESYREL       TAKE these medications    allopurinol 300 MG tablet Commonly known as: ZYLOPRIM Take 1 tablet (300 mg total) by mouth daily.   buPROPion 150 MG 12 hr tablet Commonly known as: WELLBUTRIN SR Take 1 tablet (150 mg total) by mouth 2 (two) times daily.   folic acid 1 MG tablet Commonly known as: FOLVITE Take 1 tablet (1 mg total) by mouth daily. Start taking on: April 29, 2022   methocarbamol 500 MG tablet Commonly known as: Robaxin Take 1 tablet (500 mg total) by mouth every 8 (eight)  hours as needed for muscle spasms.   metoprolol succinate 50 MG 24 hr tablet Commonly known as: TOPROL-XL Take 1 tablet (50 mg total) by mouth daily.   ondansetron 4 MG disintegrating tablet Commonly known as: ZOFRAN-ODT Take 1 tablet (4 mg total) by mouth every 8 (eight) hours as needed for nausea or vomiting.   pantoprazole 40 MG tablet  Commonly known as: PROTONIX Take 1 tablet (40 mg total) by mouth 2 (two) times daily. What changed: when to take this   rosuvastatin 5 MG tablet Commonly known as: Crestor Take 1 tablet (5 mg total) by mouth daily.   Vilazodone HCl 20 MG Tabs Take 1 tablet (20 mg total) by mouth daily.        Major procedures and Radiology Reports - PLEASE review detailed and final reports for all details, in brief -   CT ABDOMEN PELVIS W CONTRAST  Result Date: 04/26/2022 CLINICAL DATA:  Abdominal pain. EXAM: CT ABDOMEN AND PELVIS WITH CONTRAST TECHNIQUE: Multidetector CT imaging of the abdomen and pelvis was performed using the standard protocol following bolus administration of intravenous contrast. RADIATION DOSE REDUCTION: This exam was performed according to the departmental dose-optimization program which includes automated exposure control, adjustment of the mA and/or kV according to patient size and/or use of iterative reconstruction technique. CONTRAST:  161m OMNIPAQUE IOHEXOL 300 MG/ML  SOLN COMPARISON:  April 26, 2020 FINDINGS: Lower chest: No acute abnormality. Hepatobiliary: The liver is cirrhotic in appearance. No focal liver abnormality is seen. No gallstones, gallbladder wall thickening, or biliary dilatation. Pancreas: Unremarkable. No pancreatic ductal dilatation or surrounding inflammatory changes. Spleen: Normal in size without focal abnormality. Adrenals/Urinary Tract: A 2.6 cm diameter low-attenuation (approximately 65.64 Hounsfield units) left adrenal mass is seen. The right adrenal gland is unremarkable. Kidneys are normal, without renal  calculi, focal lesion, or hydronephrosis. The urinary bladder is poorly distended and subsequently limited in evaluation. Stomach/Bowel: Stomach is within normal limits. Appendix appears normal. The proximal duodenum is moderately thickened and inflamed with a moderate amount of peri-duodenal inflammatory fat stranding. No evidence of bowel dilatation. Vascular/Lymphatic: Aortic atherosclerosis. 13 mm and 11 mm mesenteric lymph nodes are seen above the junction of the pancreatic body and head. Reproductive: Status post hysterectomy. No adnexal masses. Other: No abdominal wall hernia or abnormality. No abdominopelvic ascites. Musculoskeletal: No acute or significant osseous findings. IMPRESSION: 1. Moderate severity duodenitis. 2. Hepatic cirrhosis. 3. Stable, benign, left adrenal adenoma. No additional follow-up imaging is recommended. This recommendation follows ACR consensus guidelines: Management of Incidental Adrenal Masses: A White Paper of the ACR Incidental Findings Committee. J Am Coll Radiol 2017;14:1038-1044. 4. Aortic atherosclerosis. Aortic Atherosclerosis (ICD10-I70.0). Electronically Signed   By: TVirgina NorfolkM.D.   On: 04/26/2022 00:17   UKoreaAbdomen Limited RUQ (LIVER/GB)  Result Date: 04/19/2022 CLINICAL DATA:  Hepatomegaly.  Cirrhosis. EXAM: ULTRASOUND ABDOMEN LIMITED RIGHT UPPER QUADRANT COMPARISON:  Abdominal ultrasound April 26, 2020. CT of the abdomen and pelvis April 26, 2020. FINDINGS: Gallbladder: Sludge and a few stones in the gallbladder. No wall thickening, pericholecystic fluid, or Murphy's sign. Common bile duct: Diameter: 2.7 mm Liver: Increased heterogeneous echogenicity. Mildly nodular contour. No liver mass. Portal vein is patent on color Doppler imaging with normal direction of blood flow towards the liver. Other: None. IMPRESSION: 1. A mildly nodular contour and increased heterogeneous echogenicity associated with the liver are consistent with reported history of  cirrhosis. 2. Mild cholelithiasis and sludge in an otherwise normal appearing gallbladder. 3. No other abnormalities. Electronically Signed   By: DDorise BullionIII M.D.   On: 04/19/2022 14:04    Micro Results   No results found for this or any previous visit (from the past 240 hour(s)).  Today   Subjective    RKeilani Terrancetoday has no complaints -Eating and drinking well        -Stools are getting lighter -  Patient has been seen and examined prior to discharge   Objective   Blood pressure (!) 160/66, pulse 93, temperature 97.6 F (36.4 C), resp. rate 16, height 5' (1.524 m), weight 69.9 kg, SpO2 99 %.   Intake/Output Summary (Last 24 hours) at 04/28/2022 1035 Last data filed at 04/28/2022 0900 Gross per 24 hour  Intake 1160 ml  Output 0 ml  Net 1160 ml    Exam Gen:- Awake Alert, no acute distress  HEENT:- [place].AT, No sclera icterus Neck-Supple Neck,No JVD,.  Lungs-  CTAB , good air movement bilaterally CV- S1, S2 normal, regular Abd-  +ve B.Sounds, Abd Soft, No significant epigastric or abdominal tenderness,    Extremity/Skin:- No  edema,   good pulses Psych-affect is appropriate, oriented x3 Neuro-no new focal deficits, no tremors    Data Review   CBC w Diff:  Lab Results  Component Value Date   WBC 12.5 (H) 04/28/2022   HGB 8.7 (L) 04/28/2022   HGB 10.4 (L) 04/10/2022   HCT 26.4 (L) 04/28/2022   HCT 31.8 (L) 04/10/2022   PLT 319 04/28/2022   PLT 298 04/10/2022   LYMPHOPCT 9 04/25/2022   MONOPCT 8 04/25/2022   EOSPCT 0 04/25/2022   BASOPCT 1 04/25/2022    CMP:  Lab Results  Component Value Date   NA 131 (L) 04/28/2022   NA 133 (L) 04/10/2022   K 3.0 (L) 04/28/2022   CL 97 (L) 04/28/2022   CO2 25 04/28/2022   BUN 7 (L) 04/28/2022   BUN 5 (L) 04/10/2022   CREATININE 0.52 04/28/2022   CREATININE 0.43 (L) 05/04/2019   PROT 6.8 04/28/2022   PROT 8.0 04/10/2022   ALBUMIN 2.7 (L) 04/28/2022   ALBUMIN 4.0 04/10/2022   BILITOT 0.7 04/28/2022    BILITOT 0.4 04/10/2022   ALKPHOS 180 (H) 04/28/2022   AST 48 (H) 04/28/2022   ALT 46 (H) 04/28/2022  .  Total Discharge time is about 33 minutes  Roxan Hockey M.D on 04/28/2022 at 10:35 AM  Go to [email or web address] -  for contact info  [facility] - Office  [phone]

## 2022-04-29 LAB — AFP TUMOR MARKER: AFP, Serum, Tumor Marker: 2.6 ng/mL (ref 0.0–9.2)

## 2022-04-30 ENCOUNTER — Other Ambulatory Visit: Payer: Self-pay | Admitting: Gastroenterology

## 2022-04-30 ENCOUNTER — Encounter: Payer: Self-pay | Admitting: *Deleted

## 2022-04-30 ENCOUNTER — Telehealth: Payer: Self-pay | Admitting: *Deleted

## 2022-04-30 DIAGNOSIS — K269 Duodenal ulcer, unspecified as acute or chronic, without hemorrhage or perforation: Secondary | ICD-10-CM

## 2022-04-30 LAB — GASTRIN: Gastrin: 294 pg/mL — ABNORMAL HIGH (ref 0–115)

## 2022-04-30 MED ORDER — SUCRALFATE 1 G PO TABS: 1.0000 g | ORAL_TABLET | Freq: Three times a day (TID) | ORAL | 0 refills | Status: DC

## 2022-04-30 NOTE — Patient Outreach (Signed)
Care Coordination [facility] Note Transition Care Management Follow-up Telephone Call Date of discharge and from where: 04/28/22 from [facility] How have you been since you were released from the hospital? "I didn't feel too good today after I ate something today. Upper part of my stomach is hurting. I've been up all night long. I couldn't sleep." Any questions or concerns? Yes. Regarding follow-up appointments, insomnia, and upper GI pain  Items Reviewed: Did the pt receive and understand the discharge instructions provided? Yes  Medications obtained and verified? No . Medications reviewed and verified. Patient will pick-up today. Reviewed changes. Protonix increased to BID. Folic acid added. D/C diclofenac and any NSAIDs. Sucralfate TID Other? Yes . Discussed alcoholism. Patient stopped drinking almost 2 weeks ago. 1 week prior to hospital admission. She has some tremors and isn't sleeping, but no other major withdrawal or DT symptoms. She should be well outside of the window for severe DTs. She is not taking anything to manage withdrawal and declined rehab. Reviewed endoscopy report and explained results to patient. Biopsy on esophageal nodule pending as well as h.pylori bx of stomach. Also discovered that she is to be taking sucralfate TID for one month. This was not prescribed. Reviewed inpatient note and lab results. Hgb was trending down at discharge. Any new allergies since your discharge? No  Dietary orders reviewed? Yes. Reviewed discharge instructions for diet extensively Do you have support at home? Yes   Home Care and Equipment/Supplies: Were home health services ordered? no If so, what is the name of the agency? N/a  Has the agency set up a time to come to the patient's home? not applicable Were any new equipment or medical supplies ordered?  No What is the name of the medical supply agency? N/a Were you able to get the supplies/equipment? not applicable Do you have any questions related  to the use of the equipment or supplies? No  Functional Questionnaire: (I = Independent and D = Dependent) ADLs: I  Bathing/Dressing- I  Meal Prep- I  Eating- I  Maintaining continence- I  Transferring/Ambulation- I  Managing Meds- I  Follow up appointments reviewed:  PCP Hospital f/u appt confirmed? Yes  Scheduled to see Monia Pouch, FNP on 05/03/22 @ 9:30 repeat CBC and CMP. [facility] f/u appt confirmed? Yes  Scheduled to see Neil Crouch, PA (GI)on 10/31 @ 8:00. Provided with telephone number and address. Are transportation arrangements needed? No  If their condition worsens, is the pt aware to call PCP or go to the Emergency Dept.? Yes Was the patient provided with contact information for the PCP's office or ED? Yes Was to pt encouraged to call back with questions or concerns? Yes  SDOH assessments and interventions completed:   Yes  Care Coordination Interventions Activated:  Yes   Care Coordination Interventions:  PCP follow up appointment requested [facility] for Care Coordination Services:  [place] with PCP Triage Nurse to schedule f/u appt   Collaborated with Dr Jenetta Downer regarding sucralfate prescription. Advised that it was provided in procedure instructions for her to take it 3 times a day for 1 month but that it wasn't sent to the pharmacy. Confirmed that pharmacy did receive prescription. Patient advised on directions.  Collaborated with GI scheduler to schedule hosp follow-up appt Provided information on [facility] including RN and LCSW support   Encounter Outcome:  Pt. Visit Completed    Chong Sicilian, BSN, RN-BC [facility]  Dial: [phone] Main #: [id number]

## 2022-05-01 ENCOUNTER — Encounter (HOSPITAL_COMMUNITY): Payer: Self-pay | Admitting: Gastroenterology

## 2022-05-01 ENCOUNTER — Encounter (INDEPENDENT_AMBULATORY_CARE_PROVIDER_SITE_OTHER): Payer: Self-pay | Admitting: *Deleted

## 2022-05-01 NOTE — Progress Notes (Signed)
Lab result letter mailed to patient

## 2022-05-02 LAB — SURGICAL PATHOLOGY

## 2022-05-03 ENCOUNTER — Ambulatory Visit (INDEPENDENT_AMBULATORY_CARE_PROVIDER_SITE_OTHER): Payer: Medicare HMO | Admitting: Family Medicine

## 2022-05-03 ENCOUNTER — Encounter: Payer: Self-pay | Admitting: Family Medicine

## 2022-05-03 VITALS — BP 125/66 | HR 81 | Temp 97.4°F | Ht 61.0 in | Wt 154.8 lb

## 2022-05-03 DIAGNOSIS — C159 Malignant neoplasm of esophagus, unspecified: Secondary | ICD-10-CM

## 2022-05-03 DIAGNOSIS — Z09 Encounter for follow-up examination after completed treatment for conditions other than malignant neoplasm: Secondary | ICD-10-CM | POA: Diagnosis not present

## 2022-05-03 DIAGNOSIS — R7989 Other specified abnormal findings of blood chemistry: Secondary | ICD-10-CM

## 2022-05-03 DIAGNOSIS — E46 Unspecified protein-calorie malnutrition: Secondary | ICD-10-CM | POA: Diagnosis not present

## 2022-05-03 DIAGNOSIS — K7030 Alcoholic cirrhosis of liver without ascites: Secondary | ICD-10-CM

## 2022-05-03 DIAGNOSIS — K260 Acute duodenal ulcer with hemorrhage: Secondary | ICD-10-CM

## 2022-05-03 DIAGNOSIS — F1010 Alcohol abuse, uncomplicated: Secondary | ICD-10-CM | POA: Diagnosis not present

## 2022-05-03 DIAGNOSIS — E8809 Other disorders of plasma-protein metabolism, not elsewhere classified: Secondary | ICD-10-CM | POA: Diagnosis not present

## 2022-05-03 DIAGNOSIS — K219 Gastro-esophageal reflux disease without esophagitis: Secondary | ICD-10-CM

## 2022-05-03 NOTE — Progress Notes (Signed)
   Subjective:  Patient ID: Regina Richardson, female    DOB: 11/09/1960, 61 y.o.   MRN: [id number]  Patient Care Team: Joyce, Britney F, FNP as PCP - General (Family Medicine) Carver, Charles K, DO as Consulting Physician (Internal Medicine)   Chief Complaint:  hopsital follow up  (10/4-10/7 [facility]- acute duodenal ulcer with bleeding. Patient states she is still having abd pain but has improved )   HPI: [name] is a 60 y.o. female presenting on 05/03/2022 for hopsital follow up  (10/4-10/7 [facility]- acute duodenal ulcer with bleeding. Patient states she is still having abd pain but has improved )   Pt is a 61 y.o. female with medical history significant of essential hypertension, GERD, alcoholic cirrhosis, tobacco abuse who presents today for hospital discharge follow up. She was admitted on 04/26/2022 to [facility] with abdominal pain, nausea, and vomiting with imaging studies suggesting duodenitis. She was followed by GI during admission and underwent EGD, her protonix was increased to twice daily dosing and she is scheduled to see GI soon. She states since being home she has continued abdominal pain, improving daily. She denies melena, hematochezia, hemoptysis, confusion, weakness, or syncope. No abdominal distention. Does have fatigue and issues sleeping. She denies ETOH use since discharge and feels this is the cause of her insomnia. No nausea or vomiting since discharge home.   Labs during admission revealed Hgb 8.7, Hct 26.4, K 3.0, Na 131, Ca 8.5, Alk Phos 180, AST 48, ALT 46, Gastrin 294, and albumin 2.7.     Relevant past medical, surgical, family, and social history reviewed and updated as indicated.  Allergies and medications reviewed and updated. Data reviewed: Chart in Epic.   Past Medical History:  Diagnosis Date   Alcoholism (HCC)    Anxiety    Arrhythmia    heart   Arthritis    Cancer (HCC)    cervical cancer in the 80s   Cirrhosis (HCC) 04/2020   alcoholic;     Depression    Dysrhythmia    GERD (gastroesophageal reflux disease)    Hypertension    Osteoporosis    Scoliosis    Urinary incontinence     Past Surgical History:  Procedure Laterality Date   BIOPSY  10/11/2020   Procedure: BIOPSY;  Surgeon: Carver, Charles K, DO;  [facility];  Service: Endoscopy;;   BIOPSY  12/12/2020   Procedure: BIOPSY;  Surgeon: Mansouraty, Gabriel Jr., MD;  Location: [facility];  Service: Gastroenterology;;   BIOPSY  04/27/2022   Procedure: BIOPSY;  Surgeon: Castaneda Mayorga, Daniel, MD;  [facility];  Service: Gastroenterology;;   CERVICAL BIOPSY  W/ LOOP ELECTRODE EXCISION     CESAREAN SECTION     COLONOSCOPY  08/2017   Dr. Stark;  prominent and moderately lipomatous ileocecal valve, 9 mm sessile polyp in hepatic flexure, 8 mm sessile polyp in the descending colon, otherwise normal exam.  Pathology with sessile serrated polyp and hyperplastic polyp.  Recommended repeat colonoscopy in 5 years.   COLONOSCOPY N/A 06/15/2020   Procedure: COLONOSCOPY;  Surgeon: Rehman, Najeeb U, MD; 2 polyps resected and retrieved, 1 small polyp in the distal sigmoid biopsy, external hemorrhoids.  Pathology with 1 tubular adenoma and 1 hyperplastic polyp.    COLPOSCOPY     EGD with mucosal resection  03/16/2021   [facility]: 2 large plaques in the proximal-mid third of the esophagus s/p endoscopic submucosal resection and retrieval, placement of 4 MR conditional clips.    Normal stomach, normal examined duodenum.  Pathology revealed squamous cell carcinoma in situ focally extending to the peripheral inked margin.  No invasive carcinoma. Recommended repeat EGD in 3 weeks.   ESOPHAGEAL DILATION N/A 06/15/2020   Procedure: ESOPHAGEAL DILATION;  Surgeon: Rogene Houston, MD;  [facility];  Service: Endoscopy;  Laterality: N/A;   ESOPHAGOGASTRODUODENOSCOPY  08/2017   Dr. Fuller Plan; LA grade B esophagitis without bleeding s/p biopsied, diffuse moderate  inflammation and granularity in the entire examined stomach s/p biopsied, normal examined duodenum s/p biopsy. Gastric biopsy with chronic inactive gastritis without H. pylori, esophageal biopsy with mildly inflamed squamous mucosa, duodenal biopsy benign.   ESOPHAGOGASTRODUODENOSCOPY N/A 06/15/2020   Procedure: ESOPHAGOGASTRODUODENOSCOPY (EGD);  Surgeon: Rogene Houston, MD;  Proximal esophageal web, focal esophagitis at proximal esophagus s/p biopsy, benign-appearing esophageal stenosis s/p dilated, portal hypertensive gastropathy.  Esophageal biopsy with moderate squamous dysplasia.     ESOPHAGOGASTRODUODENOSCOPY (EGD) WITH PROPOFOL N/A 10/11/2020   Surgeon: Eloise Harman, DO; Eroded, inflamed mucosa in the esophagus. Biopsied. Portal hypertensive gastropathy.  Recommended PPI twice daily.  Pathology with moderate to severe squamous dysplasia.   ESOPHAGOGASTRODUODENOSCOPY (EGD) WITH PROPOFOL N/A 12/12/2020   Surgeon: Irving Copas., MD; Dionisio David, granular, mucosa in the esophagus at 23-26 cm.  Lesion 3 cm in length and encompassed 30-40% of esophageal wall s/p quick biopsy.  Erythematous mucosa in the stomach biopsied, portal hypertensive gastropathy.  Gastric biopsy with mild chronic inflammation, negative for H. pylori.  Esophageal biopsy with moderate to severe squamous dysplasia.   ESOPHAGOGASTRODUODENOSCOPY (EGD) WITH PROPOFOL N/A 04/27/2022   Procedure: ESOPHAGOGASTRODUODENOSCOPY (EGD) WITH PROPOFOL;  Surgeon: Harvel Quale, MD;  [facility];  Service: Gastroenterology;  Laterality: N/A;   FINGER SURGERY     HEMORRHOID SURGERY     left collar bone surgery     had fx, pin placed and then complitcations caused them to remove most of the bone   left forearm surgery     from left elbow down   TOTAL ABDOMINAL HYSTERECTOMY     still has ovaries   UPPER ESOPHAGEAL ENDOSCOPIC ULTRASOUND (EUS) N/A 12/12/2020   Surgeon: Irving Copas., MD;without  esophageal wall thickening, 1 lymph node in the middle paraesophageal mediastinum that was suggestive of benign inflammatory changes, pancreatic lobularity in pancreatic body, queried possibility of chronic pancreatitis though complete EUS not performed and no history of pancreatitis.    Social History   Socioeconomic History   Marital status: Married    Spouse name: Not on file   Number of children: 1   Years of education: Not on file   Highest education level: Not on file  Occupational History   Occupation: retired  Tobacco Use   Smoking status: Every Day    Packs/day: 1.50    Years: 40.00    Total pack years: 60.00    Types: Cigarettes   Smokeless tobacco: Never  Vaping Use   Vaping Use: Never used  Substance and Sexual Activity   Alcohol use: Yes    Alcohol/week: 12.0 standard drinks of alcohol    Types: 12 Cans of beer per week    Comment: 12 cans of beer daily   Drug use: No    Comment: Tried cocaine and marijuana in the 60s.    Sexual activity: Not Currently    Birth control/protection: None  Other Topics Concern   Not on file  Social History Narrative   Not on file   Social Determinants of Health  Financial Resource Strain: Low Risk  (04/30/2022)   Overall Financial Resource Strain (CARDIA)    Difficulty of Paying Living Expenses: Not very hard  Food Insecurity: No Food Insecurity (04/26/2022)   Hunger Vital Sign    Worried About Running Out of Food in the Last Year: Never true    Ran Out of Food in the Last Year: Never true  Transportation Needs: No Transportation Needs (04/30/2022)   PRAPARE - Hydrologist (Medical): No    Lack of Transportation (Non-Medical): No  Physical Activity: Not on file  Stress: Not on file  Social Connections: Not on file  Intimate Partner Violence: Not At Risk (04/26/2022)   Humiliation, Afraid, Rape, and Kick questionnaire    Fear of Current or Ex-Partner: No    Emotionally Abused: No    Physically  Abused: No    Sexually Abused: No    Outpatient Encounter Medications as of 05/03/2022  Medication Sig   allopurinol (ZYLOPRIM) 300 MG tablet Take 1 tablet (300 mg total) by mouth daily.   buPROPion (WELLBUTRIN SR) 150 MG 12 hr tablet Take 1 tablet (150 mg total) by mouth 2 (two) times daily.   folic acid (FOLVITE) 1 MG tablet Take 1 tablet (1 mg total) by mouth daily.   methocarbamol (ROBAXIN) 500 MG tablet Take 1 tablet (500 mg total) by mouth every 8 (eight) hours as needed for muscle spasms.   metoprolol succinate (TOPROL-XL) 50 MG 24 hr tablet Take 1 tablet (50 mg total) by mouth daily.   ondansetron (ZOFRAN-ODT) 4 MG disintegrating tablet Take 1 tablet (4 mg total) by mouth every 8 (eight) hours as needed for nausea or vomiting.   pantoprazole (PROTONIX) 40 MG tablet Take 1 tablet (40 mg total) by mouth 2 (two) times daily.   rosuvastatin (CRESTOR) 5 MG tablet Take 1 tablet (5 mg total) by mouth daily.   sucralfate (CARAFATE) 1 g tablet Take 1 tablet (1 g total) by mouth 4 (four) times daily -  with meals and at bedtime.   Vilazodone HCl 20 MG TABS Take 1 tablet (20 mg total) by mouth daily.   No facility-administered encounter medications on file as of 05/03/2022.    No Known Allergies  Review of Systems  Constitutional:  Positive for activity change, appetite change and fatigue. Negative for chills, diaphoresis, fever and unexpected weight change.  HENT: Negative.    Eyes: Negative.  Negative for photophobia and visual disturbance.  Respiratory:  Negative for cough, chest tightness and shortness of breath.   Cardiovascular:  Negative for chest pain, palpitations and leg swelling.  Gastrointestinal:  Positive for abdominal pain. Negative for abdominal distention, anal bleeding, blood in stool, constipation, diarrhea, nausea and vomiting.  Endocrine: Negative.   Genitourinary:  Negative for decreased urine volume, difficulty urinating, dysuria, frequency and urgency.   Musculoskeletal:  Negative for arthralgias and myalgias.  Skin: Negative.   Allergic/Immunologic: Negative.   Neurological:  Negative for dizziness, weakness and headaches.  Hematological: Negative.   Psychiatric/Behavioral:  Positive for sleep disturbance. Negative for behavioral problems, confusion, decreased concentration, dysphoric mood, hallucinations, self-injury and suicidal ideas. The patient is not nervous/anxious and is not hyperactive.   All other systems reviewed and are negative.       Objective:  BP 125/66   Pulse 81   Temp (!) 97.4 F (36.3 C) (Temporal)   Ht 5' 1" (1.549 m)   Wt 154 lb 12.8 oz (70.2 kg)   SpO2 99%  BMI 29.25 kg/m    Wt Readings from Last 3 Encounters:  05/03/22 154 lb 12.8 oz (70.2 kg)  04/26/22 154 lb (69.9 kg)  04/10/22 161 lb (73 kg)    Physical Exam Vitals and nursing note reviewed.  Constitutional:      General: She is not in acute distress.    Appearance: Normal appearance. She is well-developed and well-groomed. She is obese. She is not ill-appearing, toxic-appearing or diaphoretic.  HENT:     Head: Normocephalic and atraumatic.     Jaw: There is normal jaw occlusion.     Right Ear: Hearing normal.     Left Ear: Hearing normal.     Nose: Nose normal.     Mouth/Throat:     Lips: Pink.     Mouth: Mucous membranes are moist.     Pharynx: Oropharynx is clear. Uvula midline.  Eyes:     General: Lids are normal.     Extraocular Movements: Extraocular movements intact.     Conjunctiva/sclera: Conjunctivae normal.     Pupils: Pupils are equal, round, and reactive to light.  Neck:     Thyroid: No thyroid mass, thyromegaly or thyroid tenderness.     Vascular: No carotid bruit or JVD.     Trachea: Trachea and phonation normal.  Cardiovascular:     Rate and Rhythm: Normal rate and regular rhythm.     Chest Wall: PMI is not displaced.     Pulses: Normal pulses.     Heart sounds: Normal heart sounds. No murmur heard.    No friction  rub. No gallop.  Pulmonary:     Effort: Pulmonary effort is normal. No respiratory distress.     Breath sounds: Normal breath sounds. No wheezing.  Abdominal:     General: Bowel sounds are normal. There is no distension or abdominal bruit. There are no signs of injury.     Palpations: Abdomen is soft. There is no hepatomegaly or splenomegaly.     Tenderness: There is abdominal tenderness in the epigastric area and periumbilical area. There is no right CVA tenderness, left CVA tenderness, guarding or rebound. Negative signs include Murphy's sign, Rovsing's sign, McBurney's sign, psoas sign and obturator sign.     Hernia: No hernia is present.  Musculoskeletal:        General: Normal range of motion.     Cervical back: Normal range of motion and neck supple.     Right lower leg: No edema.     Left lower leg: No edema.  Lymphadenopathy:     Cervical: No cervical adenopathy.  Skin:    General: Skin is warm and dry.     Capillary Refill: Capillary refill takes less than 2 seconds.     Coloration: Skin is not cyanotic, jaundiced or pale.     Findings: No rash.  Neurological:     General: No focal deficit present.     Mental Status: She is alert and oriented to person, place, and time.     Sensory: Sensation is intact.     Motor: Motor function is intact.     Coordination: Coordination is intact.     Gait: Gait is intact.     Deep Tendon Reflexes: Reflexes are normal and symmetric.  Psychiatric:        Attention and Perception: Attention and perception normal.        Mood and Affect: Mood and affect normal.        Speech: Speech normal.          Behavior: Behavior normal. Behavior is cooperative.        Thought Content: Thought content normal.        Cognition and Memory: Cognition and memory normal.        Judgment: Judgment normal.     Results for orders placed or performed during the hospital encounter of 04/25/22  CBC with Differential  Result Value Ref Range   WBC 17.9 (H) 4.0  - 10.5 K/uL   RBC 3.30 (L) 3.87 - 5.11 MIL/uL   Hemoglobin 10.0 (L) 12.0 - 15.0 g/dL   HCT 30.4 (L) 36.0 - 46.0 %   MCV 92.1 80.0 - 100.0 fL   MCH 30.3 26.0 - 34.0 pg   MCHC 32.9 30.0 - 36.0 g/dL   RDW 15.2 11.5 - 15.5 %   Platelets 330 150 - 400 K/uL   nRBC 0.0 0.0 - 0.2 %   Neutrophils Relative % 81 %   Neutro Abs 14.5 (H) 1.7 - 7.7 K/uL   Lymphocytes Relative 9 %   Lymphs Abs 1.6 0.7 - 4.0 K/uL   Monocytes Relative 8 %   Monocytes Absolute 1.5 (H) 0.1 - 1.0 K/uL   Eosinophils Relative 0 %   Eosinophils Absolute 0.0 0.0 - 0.5 K/uL   Basophils Relative 1 %   Basophils Absolute 0.1 0.0 - 0.1 K/uL   Immature Granulocytes 1 %   Abs Immature Granulocytes 0.14 (H) 0.00 - 0.07 K/uL  Comprehensive metabolic panel  Result Value Ref Range   Sodium 129 (L) 135 - 145 mmol/L   Potassium 3.1 (L) 3.5 - 5.1 mmol/L   Chloride 95 (L) 98 - 111 mmol/L   CO2 24 22 - 32 mmol/L   Glucose, Bld 105 (H) 70 - 99 mg/dL   BUN 13 8 - 23 mg/dL   Creatinine, Ser 0.47 0.44 - 1.00 mg/dL   Calcium 8.4 (L) 8.9 - 10.3 mg/dL   Total Protein 7.1 6.5 - 8.1 g/dL   Albumin 2.8 (L) 3.5 - 5.0 g/dL   AST 59 (H) 15 - 41 U/L   ALT 52 (H) 0 - 44 U/L   Alkaline Phosphatase 168 (H) 38 - 126 U/L   Total Bilirubin 1.1 0.3 - 1.2 mg/dL   GFR, Estimated >60 >60 mL/min   Anion gap 10 5 - 15  Lipase, blood  Result Value Ref Range   Lipase 30 11 - 51 U/L  Urinalysis, Routine w reflex microscopic Urine, Clean Catch  Result Value Ref Range   Color, Urine STRAW (A) YELLOW   APPearance CLEAR CLEAR   Specific Gravity, Urine >1.046 (H) 1.005 - 1.030   pH 7.0 5.0 - 8.0   Glucose, UA NEGATIVE NEGATIVE mg/dL   Hgb urine dipstick SMALL (A) NEGATIVE   Bilirubin Urine NEGATIVE NEGATIVE   Ketones, ur NEGATIVE NEGATIVE mg/dL   Protein, ur NEGATIVE NEGATIVE mg/dL   Nitrite NEGATIVE NEGATIVE   Leukocytes,Ua NEGATIVE NEGATIVE   RBC / HPF 0-5 0 - 5 RBC/hpf   WBC, UA 0-5 0 - 5 WBC/hpf   Bacteria, UA RARE (A) NONE SEEN   Squamous  Epithelial / LPF 0-5 0 - 5   Mucus PRESENT   Osmolality  Result Value Ref Range   Osmolality 269 (L) 275 - 295 mOsm/kg  HIV Antibody (routine testing w rflx)  Result Value Ref Range   HIV Screen 4th Generation wRfx Non Reactive Non Reactive  CBC  Result Value Ref Range   WBC 15.9 (H) 4.0 - 10.5 K/uL  RBC 3.23 (L) 3.87 - 5.11 MIL/uL   Hemoglobin 9.9 (L) 12.0 - 15.0 g/dL   HCT 30.1 (L) 36.0 - 46.0 %   MCV 93.2 80.0 - 100.0 fL   MCH 30.7 26.0 - 34.0 pg   MCHC 32.9 30.0 - 36.0 g/dL   RDW 15.1 11.5 - 15.5 %   Platelets 296 150 - 400 K/uL   nRBC 0.0 0.0 - 0.2 %  Comprehensive metabolic panel  Result Value Ref Range   Sodium 130 (L) 135 - 145 mmol/L   Potassium 3.2 (L) 3.5 - 5.1 mmol/L   Chloride 97 (L) 98 - 111 mmol/L   CO2 24 22 - 32 mmol/L   Glucose, Bld 85 70 - 99 mg/dL   BUN 13 8 - 23 mg/dL   Creatinine, Ser 0.48 0.44 - 1.00 mg/dL   Calcium 8.2 (L) 8.9 - 10.3 mg/dL   Total Protein 6.8 6.5 - 8.1 g/dL   Albumin 2.7 (L) 3.5 - 5.0 g/dL   AST 58 (H) 15 - 41 U/L   ALT 55 (H) 0 - 44 U/L   Alkaline Phosphatase 171 (H) 38 - 126 U/L   Total Bilirubin 0.9 0.3 - 1.2 mg/dL   GFR, Estimated >60 >60 mL/min   Anion gap 9 5 - 15  Magnesium  Result Value Ref Range   Magnesium 1.5 (L) 1.7 - 2.4 mg/dL  Folate  Result Value Ref Range   Folate 11.4 >5.9 ng/mL  Lipid panel  Result Value Ref Range   Cholesterol 117 0 - 200 mg/dL   Triglycerides 85 <150 mg/dL   HDL 30 (L) >40 mg/dL   Total CHOL/HDL Ratio 3.9 RATIO   VLDL 17 0 - 40 mg/dL   LDL Cholesterol 70 0 - 99 mg/dL  Basic metabolic panel  Result Value Ref Range   Sodium 129 (L) 135 - 145 mmol/L   Potassium 2.9 (L) 3.5 - 5.1 mmol/L   Chloride 99 98 - 111 mmol/L   CO2 22 22 - 32 mmol/L   Glucose, Bld 160 (H) 70 - 99 mg/dL   BUN 10 8 - 23 mg/dL   Creatinine, Ser 0.50 0.44 - 1.00 mg/dL   Calcium 8.1 (L) 8.9 - 10.3 mg/dL   GFR, Estimated >60 >60 mL/min   Anion gap 8 5 - 15  Hepatitis panel, acute  Result Value Ref Range    Hepatitis B Surface Ag NON REACTIVE NON REACTIVE   HCV Ab NON REACTIVE NON REACTIVE   Hep A IgM NON REACTIVE NON REACTIVE   Hep B C IgM NON REACTIVE NON REACTIVE  APTT  Result Value Ref Range   aPTT 35 24 - 36 seconds  CBC  Result Value Ref Range   WBC 15.5 (H) 4.0 - 10.5 K/uL   RBC 3.16 (L) 3.87 - 5.11 MIL/uL   Hemoglobin 9.7 (L) 12.0 - 15.0 g/dL   HCT 29.9 (L) 36.0 - 46.0 %   MCV 94.6 80.0 - 100.0 fL   MCH 30.7 26.0 - 34.0 pg   MCHC 32.4 30.0 - 36.0 g/dL   RDW 15.0 11.5 - 15.5 %   Platelets 318 150 - 400 K/uL   nRBC 0.0 0.0 - 0.2 %  Comprehensive metabolic panel  Result Value Ref Range   Sodium 130 (L) 135 - 145 mmol/L   Potassium 3.6 3.5 - 5.1 mmol/L   Chloride 98 98 - 111 mmol/L   CO2 22 22 - 32 mmol/L   Glucose, Bld 62 (L) 70 -   99 mg/dL   BUN 6 (L) 8 - 23 mg/dL   Creatinine, Ser 0.46 0.44 - 1.00 mg/dL   Calcium 8.4 (L) 8.9 - 10.3 mg/dL   Total Protein 6.8 6.5 - 8.1 g/dL   Albumin 2.8 (L) 3.5 - 5.0 g/dL   AST 53 (H) 15 - 41 U/L   ALT 49 (H) 0 - 44 U/L   Alkaline Phosphatase 180 (H) 38 - 126 U/L   Total Bilirubin 0.8 0.3 - 1.2 mg/dL   GFR, Estimated >60 >60 mL/min   Anion gap 10 5 - 15  Protime-INR  Result Value Ref Range   Prothrombin Time 16.0 (H) 11.4 - 15.2 seconds   INR 1.3 (H) 0.8 - 1.2  AFP tumor marker  Result Value Ref Range   AFP, Serum, Tumor Marker 2.6 0.0 - 9.2 ng/mL  Glucose, capillary  Result Value Ref Range   Glucose-Capillary 93 70 - 99 mg/dL  CBC  Result Value Ref Range   WBC 12.5 (H) 4.0 - 10.5 K/uL   RBC 2.87 (L) 3.87 - 5.11 MIL/uL   Hemoglobin 8.7 (L) 12.0 - 15.0 g/dL   HCT 26.4 (L) 36.0 - 46.0 %   MCV 92.0 80.0 - 100.0 fL   MCH 30.3 26.0 - 34.0 pg   MCHC 33.0 30.0 - 36.0 g/dL   RDW 14.8 11.5 - 15.5 %   Platelets 319 150 - 400 K/uL   nRBC 0.0 0.0 - 0.2 %  Comprehensive metabolic panel  Result Value Ref Range   Sodium 131 (L) 135 - 145 mmol/L   Potassium 3.0 (L) 3.5 - 5.1 mmol/L   Chloride 97 (L) 98 - 111 mmol/L   CO2 25 22 - 32  mmol/L   Glucose, Bld 84 70 - 99 mg/dL   BUN 7 (L) 8 - 23 mg/dL   Creatinine, Ser 0.52 0.44 - 1.00 mg/dL   Calcium 8.5 (L) 8.9 - 10.3 mg/dL   Total Protein 6.8 6.5 - 8.1 g/dL   Albumin 2.7 (L) 3.5 - 5.0 g/dL   AST 48 (H) 15 - 41 U/L   ALT 46 (H) 0 - 44 U/L   Alkaline Phosphatase 180 (H) 38 - 126 U/L   Total Bilirubin 0.7 0.3 - 1.2 mg/dL   GFR, Estimated >60 >60 mL/min   Anion gap 9 5 - 15  Gastrin  Result Value Ref Range   Gastrin 294 (H) 0 - 115 pg/mL  Glucose, capillary  Result Value Ref Range   Glucose-Capillary 83 70 - 99 mg/dL  Glucose, capillary  Result Value Ref Range   Glucose-Capillary 126 (H) 70 - 99 mg/dL  Surgical pathology  Result Value Ref Range   SURGICAL PATHOLOGY      SURGICAL PATHOLOGY CASE: [id number] PATIENT: Ramla Bowery Surgical Pathology Report     Clinical History: abdominal pain, vomiting, duodenitis     FINAL MICROSCOPIC DIAGNOSIS:  A. STOMACH, BIOPSY: - Reactive gastropathy.  No atypia, dysplasia or malignancy.  B. ESOPHAGUS, NODULE, 25 CM, BIOPSY: - Focal benign gastric mucosa consistent with inlet patch. - Benign squamous mucosa. - No eosinophils, intestinal metaplasia, dysplasia or malignancy.   GROSS DESCRIPTION:  A: Received in formalin are tan, soft tissue fragments that are submitted in toto. Number: 5 size: 0.2-0.5 cm blocks: 1  B: Received in formalin are tan, soft tissue fragments that are submitted in toto. Number: 6 size: 0.3-0.6 cm blocks: 1 (GRP 04/27/2022)   Final Diagnosis performed by Mark Jordan, MD.   Electronically signed 05/02/2022   Technical component performed at [facility], [place] [address] [place], [place] [address].  Professional component performed at [employer]. [facility], [place] [address] [place], [place] [address].  Immunohistochemistry Technical component (if applicable) was performed at [facility]. [address], [place], [place], [place]  [address].   IMMUNOHISTOCHEMISTRY DISCLAIMER (if applicable): Some of these immunohistochemical stains may have been developed and the performance characteristics determine by [facility]. Some may not have been cleared or approved by the U.S. Food and Drug Administration. The FDA has determined that such clearance or approval is not necessary. This test is used for clinical purposes. It should not be regarded as investigational or for research. This laboratory is certified under the [place] (CLIA-88) as qualified to perform high complexity clinical laboratory testing.  The controls stained appropriately.        Pertinent labs & imaging results that were available during my care of the patient were reviewed by me and considered in my medical decision making.  Assessment & Plan:  Brizeyda was seen today for hopsital follow up .  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 The patient was discharged from [facility] on 04/28/2022 with a primary diagnosis of acute duodenal ulcer with bleeding / Duodenitis. Contact with the patient and/or caregiver, by a clinical staff member, was made on 04/30/2022 and was documented as a telephone encounter within the EMR. Through chart review and discussion with the patient I have determined that management of their condition is of high complexity.   Pt aware to keep follow up with GI as scheduled. Medications reviewed during visit today. Continued alcohol cessation encouraged. Declined referral to [facility] Will  repeat below labs. Pt aware to avoid ibuprofen/Advil/Aleve/Motrin/Goody Powders/Naproxen/BC powders/Meloxicam/Diclofenac/Indomethacin and other Nonsteroidal anti-inflammatory medications as these will make you more likely to bleed and can cause stomach ulcers, can also cause Kidney problems. Please avoid coffee/caffeinated beverages, Tea, chocolate, carbonated drinks/soda, spicy food, Milk,  Alcohol, acidic foods- such as citrus (Lemon, oranges), juices  and tomatoes, Meats with a high fat content. Please Avoid High-fat condiments and fried foods. -     CMP14+EGFR -     CBC with Differential/Platelet     Continue all other maintenance medications.  Follow up plan: Return if symptoms worsen or fail to improve.   Continue healthy lifestyle choices, including diet (rich in fruits, vegetables, and lean proteins, and low in salt and simple carbohydrates) and exercise (at least 30 minutes of moderate physical activity daily).   The above assessment and management plan was discussed with the patient. The patient verbalized understanding of and has agreed to the management plan. Patient is aware to call the clinic if they develop any new symptoms or if symptoms persist or worsen. Patient is aware when to return to the clinic for a follow-up visit. Patient educated on when it is appropriate to go to the emergency department.   Monia Pouch, FNP-C [facility] [phone]

## 2022-05-04 LAB — CBC WITH DIFFERENTIAL/PLATELET
Basophils Absolute: 0.2 10*3/uL (ref 0.0–0.2)
Basos: 1 %
EOS (ABSOLUTE): 0.3 10*3/uL (ref 0.0–0.4)
Eos: 2 %
Hematocrit: 27.2 % — ABNORMAL LOW (ref 34.0–46.6)
Hemoglobin: 9.3 g/dL — ABNORMAL LOW (ref 11.1–15.9)
Immature Grans (Abs): 0.2 10*3/uL — ABNORMAL HIGH (ref 0.0–0.1)
Immature Granulocytes: 1 %
Lymphocytes Absolute: 1.9 10*3/uL (ref 0.7–3.1)
Lymphs: 11 %
MCH: 30.7 pg (ref 26.6–33.0)
MCHC: 34.2 g/dL (ref 31.5–35.7)
MCV: 90 fL (ref 79–97)
Monocytes Absolute: 1.4 10*3/uL — ABNORMAL HIGH (ref 0.1–0.9)
Monocytes: 8 %
Neutrophils Absolute: 13.2 10*3/uL — ABNORMAL HIGH (ref 1.4–7.0)
Neutrophils: 77 %
Platelets: 471 10*3/uL — ABNORMAL HIGH (ref 150–450)
RBC: 3.03 x10E6/uL — ABNORMAL LOW (ref 3.77–5.28)
RDW: 13.3 % (ref 11.7–15.4)
WBC: 17.2 10*3/uL — ABNORMAL HIGH (ref 3.4–10.8)

## 2022-05-04 LAB — CMP14+EGFR
ALT: 41 IU/L — ABNORMAL HIGH (ref 0–32)
AST: 51 IU/L — ABNORMAL HIGH (ref 0–40)
Albumin/Globulin Ratio: 0.9 — ABNORMAL LOW (ref 1.2–2.2)
Albumin: 3.6 g/dL — ABNORMAL LOW (ref 3.9–4.9)
Alkaline Phosphatase: 230 IU/L — ABNORMAL HIGH (ref 44–121)
BUN/Creatinine Ratio: 15 (ref 12–28)
BUN: 16 mg/dL (ref 8–27)
Bilirubin Total: 0.3 mg/dL (ref 0.0–1.2)
CO2: 23 mmol/L (ref 20–29)
Calcium: 8.6 mg/dL — ABNORMAL LOW (ref 8.7–10.3)
Chloride: 92 mmol/L — ABNORMAL LOW (ref 96–106)
Creatinine, Ser: 1.07 mg/dL — ABNORMAL HIGH (ref 0.57–1.00)
Globulin, Total: 3.8 g/dL (ref 1.5–4.5)
Glucose: 93 mg/dL (ref 70–99)
Potassium: 3.9 mmol/L (ref 3.5–5.2)
Sodium: 134 mmol/L (ref 134–144)
Total Protein: 7.4 g/dL (ref 6.0–8.5)
eGFR: 59 mL/min/{1.73_m2} — ABNORMAL LOW (ref >59)

## 2022-05-09 ENCOUNTER — Ambulatory Visit: Payer: Self-pay | Admitting: *Deleted

## 2022-05-09 NOTE — Patient Instructions (Addendum)
Visit Information  Thank you for taking time to visit with me today. Please don't hesitate to contact me if I can be of assistance to you.   Following are the goals we discussed today:   Goals Addressed               This Visit's Progress     Patient Stated     Increase quality of sleep (THN) (pt-stated)        Care Coordination Interventions: Evaluation of current treatment plan related to sleep disturbance  and patient's adherence to plan as established by provider Discussed plans with patient for ongoing care management follow up and provided patient with direct contact information for care management team Encouraged her to rest. Reviewed her scheduled care coordination outreach after the transition of care outreach was completed.          Our next appointment is by telephone on 05/15/22 at 3:30 pm  Please call the care guide team at [phone] if you need to cancel or reschedule your appointment.   If you are experiencing a Mental Health or [place] or need someone to talk to, please call the Suicide and Crisis Lifeline: 988 call the [place] National Suicide Prevention Lifeline: (403) 276-1345 or TTY: 501-750-6853 TTY (302)084-8612) to talk to a trained counselor call 1-800-273-TALK (toll free, 24 hour hotline) call the [facility]: 857-514-2694 call 911   The patient verbalized understanding of instructions, educational materials, and care plan provided today and DECLINED offer to receive copy of patient instructions, educational materials, and care plan.   The patient has been provided with contact information for the care management team and has been advised to call with any health related questions or concerns.   Izzabella Besse L. Lavina Hamman, RN, BSN, [place] Coordinator Office number [phone]

## 2022-05-09 NOTE — Patient Outreach (Signed)
  Care Coordination   Initial Visit Note   05/09/2022 Name: Regina Richardson MRN: [id number] DOB: 25-Dec-1960  Regina Richardson is a 61 y.o. year old female who sees Shiel Brooklyn, FNP for primary care. I spoke with  Regina Richardson by phone today.  What matters to the patients health and wellness today?  Patient reports to RN CM she had a difficult time sleeping last night and was trying to sleep at the time of the outreach to her today. RN CM assessed for any other worsening symptoms especially with her stomach. RN CM reviewed the scheduled call to her after her transition of care outreach on 04/30/22 and the care coordination program. RN CM discussed the change in pcp to Rakes RNP Inquired about returning a call to her at a later time. Encouraged her to rest.  Overall she denies any needs at the time of the call. She requests a return call    Goals Addressed               This Visit's Progress     Patient Stated     Increase quality of sleep (THN) (pt-stated)        Care Coordination Interventions: Evaluation of current treatment plan related to sleep disturbance  and patient's adherence to plan as established by provider Discussed plans with patient for ongoing care management follow up and provided patient with direct contact information for care management team Encouraged her to rest. Reviewed her scheduled care coordination outreach after the transition of care outreach was completed.          SDOH assessments and interventions completed:  No     Care Coordination Interventions Activated:  Yes  Care Coordination Interventions:  Yes, provided   Follow up plan: Follow up call scheduled for 05/15/22    Encounter Outcome:  Pt. Visit Completed   Chrishauna Mee L. Lavina Hamman, RN, BSN, [place] Coordinator Office number [phone]

## 2022-05-15 ENCOUNTER — Ambulatory Visit: Payer: Self-pay | Admitting: *Deleted

## 2022-05-15 NOTE — Patient Outreach (Signed)
  Care Coordination   05/15/2022 Name: Regina Richardson MRN: [id number] DOB: January 13, 1961   Care Coordination Outreach Attempts:  An unsuccessful telephone outreach was attempted today to offer the patient information about available care coordination services as a benefit of their health plan.   Follow Up Plan:  Additional outreach attempts will be made to offer the patient care coordination information and services.   Encounter Outcome:  No Answer  Care Coordination Interventions Activated:  No   Care Coordination Interventions:  No, not indicated    Rajat Staver L. Lavina Hamman, RN, BSN, [place] Coordinator Office number [phone]

## 2022-05-22 ENCOUNTER — Encounter: Payer: Self-pay | Admitting: Gastroenterology

## 2022-05-22 ENCOUNTER — Ambulatory Visit: Payer: Medicare HMO | Admitting: Gastroenterology

## 2022-05-22 VITALS — BP 153/89 | HR 61 | Temp 97.9°F | Ht 60.0 in | Wt 155.0 lb

## 2022-05-22 DIAGNOSIS — C159 Malignant neoplasm of esophagus, unspecified: Secondary | ICD-10-CM

## 2022-05-22 DIAGNOSIS — K269 Duodenal ulcer, unspecified as acute or chronic, without hemorrhage or perforation: Secondary | ICD-10-CM | POA: Diagnosis not present

## 2022-05-22 DIAGNOSIS — K7030 Alcoholic cirrhosis of liver without ascites: Secondary | ICD-10-CM

## 2022-05-22 NOTE — Patient Instructions (Signed)
Please call in with a complete list of medications that you are currently taking. The medication list on hand out today has not been confirmed and may not be correct. Once we have updated medication list, I will make further recommendations for management of your constipation.  I will check about follow up locally for esophageal cancer. We will let you know if it is possible. We will be updating your labs in about a month or so to let the effects of recent alcohol  resolve first.  Continue with alcohol avoidance. It is necessary for you to survive. You should reach out to [employer], consider counseling, discuss with your PCP regarding any possible options to you for etoh abstinence.

## 2022-05-22 NOTE — Progress Notes (Signed)
GI Office Note    Referring Provider: Sawtell Brooklyn, FNP Primary Care Physician:  Beitzel Brooklyn, FNP  Primary Gastroenterologist: Elon Alas. Abbey Chatters, DO   Chief Complaint   Chief Complaint  Patient presents with   Hospitalization Follow-up    Doing better.    History of Present Illness   Regina Richardson is a 61 y.o. female presenting today for hospital follow-up.  Seen several weeks ago during hospitalization, presented at that time for abdominal pain and intractable vomiting. 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  Seen by our practice in October 2021, advised of cirrhosis at that time.  Work-up in 2021,Hemochromatosis DNA negative for C282Y and H63D, ferritin returned to normal at 91, alk phos 195, AST 66, ALT 31, ASMA negative, ANA negative, ANA negative, IgG elevated at 1702, IgA elevated at 783.  Suspected IgA and IgG elevation may be secondary to alcohol.  AFP 3.5. Hepatitis C antibody negative, hepatitis B surface antigen negative.  Advise she continue to work towards alcohol cessation.  She was lost to follow-up.  During recent hospitalization, she had CT A/P with contrast showing moderately severe duodenitis, hepatic cirrhosis, stable benign left adrenal adenoma. Gastrin level 294, not felt to be significantly elevated in the range of a gastrinoma, plan for repeat testing off PPI if possible as an outpatient. MELD Na of 9 while inpatient.  EGD this admission on April 27, 2022: - Scar in the middle third of the esophagus. - Mucosal nodule found in the esophagus. Biopsied. - Mucous like gastric fluid. Fluid aspiration performed. Normal stomach biopsied. - Multiple non-bleeding duodenal ulcers with a clean ulcer base (Forrest Class III). - Duodenitis -Gastric biopsy showed reactive gastropathy. -Esophageal nodule biopsy showed focal benign gastric mucosa consistent with inlet patch, squamous  mucosa.  Today: patient states she was told by [facility], that she can continue follow up EGDs locally. She does not feel that she has transportation routinely available to go back to [place]. She states she had two sisters and mother, all died with etoh cirrhosis. She reports last etoh was several days prior to recent admission. She has urged to drink. She is not sure if she will be able to maintain her sobriety. States she is more irritable off etoh. She states she is eating better. No vomiting. No abdominal pain. No melena, brbpr. Dealing with constipation, bristol 1 stools.  Drinking six bottles of water daily. Denies NSAIDs currently but it is difficult to determine if she was on any prior to EGD. She is a very difficult historian, requiring persistent redirection. She did not bring her medications today therefore medication list may not be accurate.     Pertinent history:  Patient had colonoscopy in February 2019 with Dr. Fuller Plan with [facility] for abnormal cecum on CT. Findings included prominent and moderately lipomatous ileocecal valve, 9 mm sessile polyp in hepatic flexure, 8 mm sessile polyp in the descending colon, otherwise normal exam.  Pathology with sessile serrated polyp and hyperplastic polyp. Recommended repeat colonoscopy in 5 years.   EGD at the same time as colonoscopy in February 2019 for anemia and epigastric abdominal pain.  She was found to have LA grade B esophagitis without bleeding s/p biopsied, diffuse moderate inflammation and granularity in the entire examined stomach s/p biopsied, normal examined duodenum s/p biopsy. Gastric biopsy with chronic inactive gastritis without H. pylori, esophageal biopsy with mildly inflamed squamous mucosa, duodenal biopsy  benign. H.pylori breast test positive 07/2017. Patient denies treatment at that time.   Procedures 06/15/2020 performed by Dr. Laural Golden: Colonoscopy: 2 polyps resected and retrieved, 1 small polyp in the distal sigmoid biopsy, external  hemorrhoids.  Pathology with 1 tubular adenoma and 1 hyperplastic polyp.  EGD: Proximal esophageal web, focal esophagitis at proximal esophagus s/p biopsy, benign-appearing esophageal stenosis s/p dilated, portal hypertensive gastropathy.  Esophageal biopsy with moderate squamous dysplasia.  Dr. Laural Golden recommended repeat EGD in about 8 weeks.   EGD March 2022: Eroded, inflamed mucosa in the esophagus, portal hypertension gastropathy, esophageal biopsy showed moderate to severe squamous dysplasia    EGD/EUS with Dr. Rush Landmark May 2022:  Dionisio David (with contact bleeding), granular, smooth, texture changed mucosa in the esophagus at 23-26 cm was encountered. Under NBI imaging and then spraying with Lugol's stain I could visualize a lesion that was 3 cm in length and encompassed 30-40% of the esophageal wall. Biopsied quickly but not as in depth as I would have like due to increased coughing and also 2 prior EGDs with Squamous dysplasia already being noted. - Erythematous mucosa in the stomach. Biopsied. - Possible mild portal hypertensive gastropathy in proximal stomach (though again, normal spleen size and only hepatomegaly appreciated on most recent CT). - Endosonographic imaging in the esophagus showed no wall thickening throughout. - One benign lymph node was visualized in the middle paraesophageal mediastinum (level 73M). Tissue has not been obtained. However, the endosonographic appearance is suggestive of benign inflammatory changes. - Pancreatic parenchymal abnormalities consisting of lobularity were noted in the pancreatic    Biopsy showed mild chronic gastric inflammation, negative for H. pylori, distal esophageal biopsy with slight inflammation, biopsies from 23 to 36 cm showed moderate to severe squamous dysplasia.    August 2022: At [facility].    Impression: - Two large plaques in the prox - middle third of the  esophagus. Clips (MR conditional) were placed. - Normal stomach. - Normal  examined duodenum. - Endoscopic submucosal dissection was performed.  Resection and retrieval were complete. -Two foci of squamous cell carcinoma in situ, focally extending to the peripheral inked margin.No invasive carcinoma is seen.Multiple levels are examined   October 2022: At [facility].   Impression: - Scar in the proximal esophagus and in the mid  esophagus. Biopsied. Dilated. - Normal stomach. - Normal examined duodenum. -if path normal, repeat EGD in 1 year -path showed Esophageal squamous mucosa with reactive change. Negative for dysplasia or carcinoma.   Medications   Current Outpatient Medications  Medication Sig Dispense Refill   allopurinol (ZYLOPRIM) 300 MG tablet Take 1 tablet (300 mg total) by mouth daily. 90 tablet 1   buPROPion (WELLBUTRIN SR) 150 MG 12 hr tablet Take 1 tablet (150 mg total) by mouth 2 (two) times daily. 496 tablet 2   folic acid (FOLVITE) 1 MG tablet Take 1 tablet (1 mg total) by mouth daily. 30 tablet 3   methocarbamol (ROBAXIN) 500 MG tablet Take 1 tablet (500 mg total) by mouth every 8 (eight) hours as needed for muscle spasms. 90 tablet 5   metoprolol succinate (TOPROL-XL) 50 MG 24 hr tablet Take 1 tablet (50 mg total) by mouth daily. 90 tablet 3   ondansetron (ZOFRAN-ODT) 4 MG disintegrating tablet Take 1 tablet (4 mg total) by mouth every 8 (eight) hours as needed for nausea or vomiting. 60 tablet 2   pantoprazole (PROTONIX) 40 MG tablet Take 1 tablet (40 mg total) by mouth 2 (two) times daily. 180 tablet 2  rosuvastatin (CRESTOR) 5 MG tablet Take 1 tablet (5 mg total) by mouth daily. 30 tablet 2   sucralfate (CARAFATE) 1 g tablet Take 1 tablet (1 g total) by mouth 4 (four) times daily -  with meals and at bedtime. 120 tablet 0   Vilazodone HCl 20 MG TABS Take 1 tablet (20 mg total) by mouth daily. 90 tablet 1   No current facility-administered medications for this visit.    Allergies   Allergies as of 05/22/2022   (No Known Allergies)        Review of Systems   General: Negative for anorexia, weight loss, fever, chills, fatigue, +weakness. ENT: Negative for hoarseness, difficulty swallowing , nasal congestion. CV: Negative for chest pain, angina, palpitations, dyspnea on exertion, peripheral edema.  Respiratory: Negative for dyspnea at rest, dyspnea on exertion, cough, sputum, wheezing.  GI: See history of present illness. GU:  Negative for dysuria, hematuria, urinary incontinence, urinary frequency, nocturnal urination.  Endo: Negative for unusual weight change.     Physical Exam   BP (!) 177/72 (BP Location: Right Arm, Patient Position: Sitting, Cuff Size: Normal)   Pulse 66   Temp 97.9 F (36.6 C) (Oral)   Ht 5' (1.524 m)   Wt 155 lb (70.3 kg)   SpO2 99%   BMI 30.27 kg/m    General: Well-nourished, well-developed in no acute distress. Constant redirection required Eyes: No icterus. Mouth: Oropharyngeal mucosa moist and pink , no lesions erythema or exudate. Abdomen: Bowel sounds are normal, nontender, slight distention but soft. Liver edge easily palpated in ruq and epigastric region. Rectus diastasis present. No abdominal bruits or hernia , no rebound or guarding.  Rectal: not performed  Extremities: No lower extremity edema. No clubbing or deformities. Neuro: Alert and oriented x 4. No asterixis. Skin: Warm and dry, no jaundice.   Psych: Alert and cooperative, normal mood and affect.  Labs   Lab Results  Component Value Date   CREATININE 1.07 (H) 05/03/2022   BUN 16 05/03/2022   NA 134 05/03/2022   K 3.9 05/03/2022   CL 92 (L) 05/03/2022   CO2 23 05/03/2022   Lab Results  Component Value Date   ALT 41 (H) 05/03/2022   AST 51 (H) 05/03/2022   ALKPHOS 230 (H) 05/03/2022   BILITOT 0.3 05/03/2022   Lab Results  Component Value Date   WBC 17.2 (H) 05/03/2022   HGB 9.3 (L) 05/03/2022   HCT 27.2 (L) 05/03/2022   MCV 90 05/03/2022   PLT 471 (H) 05/03/2022    Imaging Studies   CT ABDOMEN  PELVIS W CONTRAST  Result Date: 04/26/2022 CLINICAL DATA:  Abdominal pain. EXAM: CT ABDOMEN AND PELVIS WITH CONTRAST TECHNIQUE: Multidetector CT imaging of the abdomen and pelvis was performed using the standard protocol following bolus administration of intravenous contrast. RADIATION DOSE REDUCTION: This exam was performed according to the departmental dose-optimization program which includes automated exposure control, adjustment of the mA and/or kV according to patient size and/or use of iterative reconstruction technique. CONTRAST:  166m OMNIPAQUE IOHEXOL 300 MG/ML  SOLN COMPARISON:  April 26, 2020 FINDINGS: Lower chest: No acute abnormality. Hepatobiliary: The liver is cirrhotic in appearance. No focal liver abnormality is seen. No gallstones, gallbladder wall thickening, or biliary dilatation. Pancreas: Unremarkable. No pancreatic ductal dilatation or surrounding inflammatory changes. Spleen: Normal in size without focal abnormality. Adrenals/Urinary Tract: A 2.6 cm diameter low-attenuation (approximately 65.64 Hounsfield units) left adrenal mass is seen. The right adrenal gland is unremarkable. Kidneys are  normal, without renal calculi, focal lesion, or hydronephrosis. The urinary bladder is poorly distended and subsequently limited in evaluation. Stomach/Bowel: Stomach is within normal limits. Appendix appears normal. The proximal duodenum is moderately thickened and inflamed with a moderate amount of peri-duodenal inflammatory fat stranding. No evidence of bowel dilatation. Vascular/Lymphatic: Aortic atherosclerosis. 13 mm and 11 mm mesenteric lymph nodes are seen above the junction of the pancreatic body and head. Reproductive: Status post hysterectomy. No adnexal masses. Other: No abdominal wall hernia or abnormality. No abdominopelvic ascites. Musculoskeletal: No acute or significant osseous findings. IMPRESSION: 1. Moderate severity duodenitis. 2. Hepatic cirrhosis. 3. Stable, benign, left adrenal  adenoma. No additional follow-up imaging is recommended. This recommendation follows ACR consensus guidelines: Management of Incidental Adrenal Masses: A White Paper of the ACR Incidental Findings Committee. J Am Coll Radiol 2017;14:1038-1044. 4. Aortic atherosclerosis. Aortic Atherosclerosis (ICD10-I70.0). Electronically Signed   By: Virgina Norfolk M.D.   On: 04/26/2022 00:17    Assessment   ETOH cirrhosis: no episodes of decompensation. Recent imaging without hepatoma. Recent MELD Na of 9. No esophageal varices on portal HTN on recent EGD. Discussed with patient need for complete etoh abstinence. She will need to follow with [place] routinely with labs and u/s every six months. FH of three first degree relatives deceased due to etoh cirrhosis per patient.  Elevated LFTs: chronically elevated numbers in the setting of ongoing etoh use and cirrhosis. Mildly elevated AST/ALT. Alk phos in the upper 100s/200s range, total bili normal. Will recheck in a few weeks after off etoh longer.   Duodenal ulcers: unclear etiology. Patient initially denied NSAIDs but then history not as clear. Gastrin level somewhat elevated in setting of PPI. Not felt to be high enough to indicate gastrinoma. Prior history of positive H.pylori breath test in 2019 and patient denies being treated. No h.pylori on recent gastric biopsy. No duodenal biopsies obtained.  H/O squamous cell carcinoma of the esophagus: was due to follow up surveillance at [facility] this month. Cannot see correspondence since 04/2021 path returned. Patient reports she was advised she can return locally for surveillance.   Constipation: untreated   PLAN   Need updated medication list from patient, will need to treat constipation, consider lactulose. Recommend no NSAIDS, etoh.  Recheck gastrin level at some point off PPI if possible. Consider checking H.pylori stool antigen at some point off PPI.  In four weeks, update labs. CMET, PT/INR, Hep B surf Ab, Hep A  total Ab, mitochondrial ab, IgG/IgM/IgA, antismooth muscle ab, ANA.  Will look into possible EGD surveillance locally. Requesting records from [place]. Bobby Rumpf, [place], [facility]

## 2022-05-23 ENCOUNTER — Telehealth: Payer: Self-pay

## 2022-05-23 ENCOUNTER — Other Ambulatory Visit: Payer: Self-pay

## 2022-05-23 DIAGNOSIS — K269 Duodenal ulcer, unspecified as acute or chronic, without hemorrhage or perforation: Secondary | ICD-10-CM

## 2022-05-23 NOTE — Telephone Encounter (Signed)
Primary is Dr. Abbey Chatters

## 2022-05-24 ENCOUNTER — Ambulatory Visit: Payer: Self-pay | Admitting: *Deleted

## 2022-05-24 MED ORDER — SUCRALFATE 1 G PO TABS: 1.0000 g | ORAL_TABLET | Freq: Three times a day (TID) | ORAL | 0 refills | Status: DC

## 2022-05-24 NOTE — Telephone Encounter (Signed)
One more refill provided. Short term usage planned.

## 2022-05-25 NOTE — Patient Outreach (Signed)
  Care Coordination   Follow Up Visit Note   05/25/2022 Name: Regina Richardson MRN: [id number] DOB: [birth year]  Regina Richardson is a 61 y.o. year old female who sees Vari Brooklyn, FNP for primary care. I spoke with  Regina Richardson by phone today.  What matters to the patients health and wellness today?  Abdominal pain is better today  Decrease isolation   Continues to have poor sleep pattern.   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)   Not on track     Care Coordination Interventions: Evaluation of current treatment plan related to alcohol & coping needs and patient's adherence to plan as established by provider Social Work referral for depression, alcohol cessation, counselor, psychiatrist  Screening for signs and symptoms of depression related to chronic disease state  Assessed social determinant of health barriers Patient interviewed about adult health maintenance status including  Depression screen         Increase quality of sleep (THN) (pt-stated)   Not on track     Care Coordination Interventions: Evaluation of current treatment plan related to sleep disturbance  and patient's adherence to plan as established by provider Discussed plans with patient for ongoing care management follow up and provided patient with direct contact information for care management team           SDOH assessments and interventions completed:  Yes  SDOH Interventions Today    Flowsheet Row Most Recent Value  SDOH Interventions   Food Insecurity Interventions  Intervention Not Indicated  Housing Interventions Intervention Not Indicated  Depression Interventions/Treatment  Currently on Treatment  [referral to [facility] SW for assistance with counseling, psychiatry for [facility] medication managment]  Physical Activity Interventions Other (Comments)  [reports inactive is related to her depression/fear of crowds]  Stress Interventions Provide Counseling, Other (Comment)  [referral to [facility] SW]  Social Connections Interventions Other (Comment)  [discussed her going outside more often for sunshine/vitamin D]        Care Coordination Interventions Activated:  Yes  Care Coordination Interventions:  Yes, provided   Follow up plan: Follow up call scheduled for 11/21/123    Encounter Outcome:  Pt. Visit Completed   Halen Mossbarger L. Lavina Hamman, RN, BSN, [place] Coordinator Office number [phone]

## 2022-05-25 NOTE — Patient Instructions (Addendum)
Visit Information  Thank you for taking time to visit with me today. Please don't hesitate to contact me if I can be of assistance to you.   Following are the goals we discussed today:   Goals Addressed               This Visit's Progress     Patient Stated     Behavioral health resources [facility]) (pt-stated)   Not on track     Care Coordination Interventions: Evaluation of current treatment plan related to alcohol & coping needs and patient's adherence to plan as established by provider Social Work referral for depression, alcohol cessation, counselor, psychiatrist  Screening for signs and symptoms of depression related to chronic disease state  Assessed social determinant of health barriers Patient interviewed about adult health maintenance status including  Depression screen         Increase quality of sleep (THN) (pt-stated)   Not on track     Care Coordination Interventions: Evaluation of current treatment plan related to sleep disturbance  and patient's adherence to plan as established by provider Discussed plans with patient for ongoing care management follow up and provided patient with direct contact information for care management team           Our next appointment is by telephone on 06/12/22 at 3 pm  Please call the care guide team at [phone] if you need to cancel or reschedule your appointment.   If you are experiencing a Mental Health or [place] or need someone to talk to, please call the Suicide and Crisis Lifeline: 988 call the [place] National Suicide Prevention Lifeline: 416-643-3999 or TTY: 913 158 0566 TTY (939)725-5612) to talk to a trained counselor call 1-800-273-TALK (toll free, 24 hour hotline) go to [facility] [address] [place] [phone]) [phone]) call the [place]: [phone] call 911   The patient verbalized understanding of instructions, educational materials, and  care plan provided today and DECLINED offer to receive copy of patient instructions, educational materials, and care plan.   The patient has been provided with contact information for the care management team and has been advised to call with any health related questions or concerns.   Tatyanna Cronk L. Lavina Hamman, RN, BSN, [place] Coordinator Office number [phone]

## 2022-06-03 ENCOUNTER — Other Ambulatory Visit: Payer: Self-pay | Admitting: Family Medicine

## 2022-06-03 DIAGNOSIS — E790 Hyperuricemia without signs of inflammatory arthritis and tophaceous disease: Secondary | ICD-10-CM

## 2022-06-12 ENCOUNTER — Encounter: Payer: Self-pay | Admitting: *Deleted

## 2022-06-12 ENCOUNTER — Ambulatory Visit: Payer: Self-pay | Admitting: *Deleted

## 2022-06-12 NOTE — Patient Outreach (Signed)
  Care Coordination   Follow Up Visit Note   1121/2023 Name: Regina Richardson MRN: [id number] DOB: 06/23/1961  Regina Richardson is a 61 y.o. year old female who sees Gillihan Brooklyn, FNP for primary care. I spoke with  Regina Richardson by phone today.  What matters to the patients health and wellness today?  Abdominal pain remains   Not sleeping better moved to her couch vs bed   Socialization/behavioral health  She does not want to go in the grocery store but is dreading it  Also dreading her sister's upcoming visit No present counselor,  not wanting to take more pills  Discussed combinations of medicines that may benefit her mental health- genotype Does not like to drive in [place]  Previously seen by a female in [place] and was receiving Klonopin 3 mg qd  Do not like to go to appointments via county bus with lots of people  "I don't trust people any more"  Stay thirsty    Goals Addressed               This Visit's Progress     Patient Stated     Behavioral health resources [facility]) (pt-stated)   Not on track     Care Coordination Interventions: Evaluation of current treatment plan related to alcohol & coping needs and patient's adherence to plan as established by provider Social Work referral for depression, alcohol cessation, counselor, psychiatrist  Assessed for possible better management of social isolation Encouragement provided related to upcoming sister visit after active listening       Increase quality of sleep (THN) (pt-stated)   Not on track     Care Coordination Interventions: Evaluation of current treatment plan related to sleep disturbance  and patient's adherence to plan as established by provider Discussed plans with patient for ongoing care management follow up and provided patient with direct contact information for care management team   Confirmed she continues with sleep concerns- moved from her bed to couch        SDOH assessments and  interventions completed:  No     Care Coordination Interventions Activated:  Yes  Care Coordination Interventions:  Yes, provided    Follow up plan: Follow up call scheduled for 07/12/22    Encounter Outcome:  Pt. Visit Completed   Jamille Yoshino L. Lavina Hamman, RN, BSN, [place] Coordinator Office number [phone]

## 2022-06-23 NOTE — Patient Instructions (Addendum)
Visit Information  Thank you for taking time to visit with me today. Please don't hesitate to contact me if I can be of assistance to you.   Following are the goals we discussed today:   Goals Addressed               This Visit's Progress     Patient Stated     Behavioral health resources [facility]) (pt-stated)   Not on track     Care Coordination Interventions: Evaluation of current treatment plan related to alcohol & coping needs and patient's adherence to plan as established by provider Social Work referral for depression, alcohol cessation, counselor, psychiatrist  Assessed for possible better management of social isolation Encouragement provided related to upcoming sister visit after active listening       Increase quality of sleep (THN) (pt-stated)   Not on track     Care Coordination Interventions: Evaluation of current treatment plan related to sleep disturbance  and patient's adherence to plan as established by provider Discussed plans with patient for ongoing care management follow up and provided patient with direct contact information for care management team   Confirmed she continues with sleep concerns- moved from her bed to couch        Our next appointment is by telephone on 07/12/22 at 3:30 pm  Please call the care guide team at [phone] if you need to cancel or reschedule your appointment.   If you are experiencing a Mental Health or [place] or need someone to talk to, please call the Suicide and Crisis Lifeline: 988 call the [place] National Suicide Prevention Lifeline: (587)676-1372 or TTY: 636-739-0865 TTY 228-678-9441) to talk to a trained counselor call 1-800-273-TALK (toll free, 24 hour hotline) call the [facility]: 979-201-0683 call 911   The patient verbalized understanding of instructions, educational materials, and care plan provided today and DECLINED offer to receive copy of patient instructions, educational  materials, and care plan.   The patient has been provided with contact information for the care management team and has been advised to call with any health related questions or concerns.   Mehr Depaoli L. Lavina Hamman, RN, BSN, [place] Coordinator Office number [phone]

## 2022-07-06 ENCOUNTER — Ambulatory Visit: Payer: Self-pay | Admitting: *Deleted

## 2022-07-08 ENCOUNTER — Encounter: Payer: Self-pay | Admitting: *Deleted

## 2022-07-08 NOTE — Patient Outreach (Addendum)
  Care Coordination   Initial Visit Note  Late Entry:  Documentation for 07/06/2022 Encounter.  07/08/2022  Name: Regina Richardson MRN: [id number] DOB: 03/08/1961  Regina Richardson is a 61 y.o. year old female who sees Regina Fudge, FNP for primary care. I spoke with Regina Richardson by phone today.  What matters to the patients health and wellness today?  Find Help in My Community.    Goals Addressed               This Visit's Progress     Find Help in My Community. (pt-stated)   On track     Care Coordination Interventions:   Assessed Social Determinant of Health Barriers. Discussed Plans with Patient for Ongoing Care Management Follow Up. Provided Patient with Direct Contact Information for Care Management Team. Screened Patient for Signs & Symptoms of Depression Related to Chronic Disease State.  LKG4/WNU2 Depression Screen Completed & Results Reviewed with Patient.  Suicidal Ideation/Homicidal Ideation Assessed - None Present. Solution-Focused Strategies Employed. Active Listening/Reflection Utilized.  Emotional Support Provided. Verbalization of Feelings Encouraged.  Problem Solving Interventions Activated. Task-Centered Solutions Developed.   Psychoeducation for Mental Health Concerns Initiated. Reviewed Mental Health Medications & Discussed Importance of Compliance. Quality of Sleep Assessed & Sleep Hygiene Techniques Promoted. Participation in Counseling Encouraged. Discussed Referral to Psychiatrist for Psychotropic Medication Management. Discussed Referral to Therapist for Psychotherapeutic Counseling Services. Please Review the Following List of Agencies, [employer], Mailed on 07/09/2022: ~ Emergency Assistance Programs in [place] ~ [facility] WPS Resources ~ Brink's Company of [place] ~ [facility] Assistance Programs ~ Occidental Petroleum Application ~ 2023 Medicaid Tips ~ Apply for WPS Resources ~ OGE Energy Application ~ [facility] [employer], Food Pantries & Soup Kitchens ~ Scientist, forensic  ~ Transportation Options ~ RCATS - [employer]          SDOH assessments and interventions completed:  Yes.  SDOH Interventions Today    Flowsheet Row Most Recent Value  SDOH Interventions   Food Insecurity Interventions Other (Comment)  [Food Banks, Stage manager and Soup Kitchens Provided.]  Housing Interventions Intervention Not Indicated  Transportation Interventions Patient Resources (Friends/Family), Payor Benefit, SCAT (Specialized Community Area Transporation)  Utilities Interventions Other (Comment)  [Financial Resources Provided.]  Alcohol Usage Interventions Intervention Not Indicated (Score <7)  Depression Interventions/Treatment  Referral to Psychiatry, Medication, Counseling, Currently on Treatment  Financial Strain Interventions Artist, Other (Comment)  [Financial Resources Provided.]  Physical Activity Interventions Patient Refused  Stress Interventions Offered [employer], Provide Counseling, Other (Comment)  [Counseling Agencies and Resources Provided.]  Social Connections Interventions Other (Comment)  [Senior Resources Provided.]     Care Coordination Interventions:  Yes, provided.   Follow up plan: Follow up call scheduled for 07/18/2022 at 1:30 pm.  Encounter Outcome:  Pt. Visit Completed.    Danford Bad, BSW, MSW, LCSW  Licensed Restaurant manager, fast food Health System  Mailing [place] [address] [place], [place] [address] Physical Address-[address] [place], [place] [address] Toll Free Main # [phone] Fax # [phone] Cell # [phone] [email or web address]

## 2022-07-08 NOTE — Patient Instructions (Signed)
Visit Information  Thank you for taking time to visit with me today. Please don't hesitate to contact me if I can be of assistance to you.   Following are the goals we discussed today:   Goals Addressed               This Visit's Progress     Find Help in My Community. (pt-stated)   On track     Care Coordination Interventions:   Assessed Social Determinant of Health Barriers. Discussed Plans with Patient for Ongoing Care Management Follow Up. Provided Patient with Direct Contact Information for Care Management Team. Screened Patient for Signs & Symptoms of Depression Related to Chronic Disease State.  LPF7/TKW4 Depression Screen Completed & Results Reviewed with Patient.  Suicidal Ideation/Homicidal Ideation Assessed - None Present. Solution-Focused Strategies Employed. Active Listening/Reflection Utilized.  Emotional Support Provided. Verbalization of Feelings Encouraged.  Problem Solving Interventions Activated. Task-Centered Solutions Developed.   Psychoeducation for Mental Health Concerns Initiated. Reviewed Mental Health Medications & Discussed Importance of Compliance. Quality of Sleep Assessed & Sleep Hygiene Techniques Promoted. Participation in Counseling Encouraged. Discussed Referral to Psychiatrist for Psychotropic Medication Management. Discussed Referral to Therapist for Psychotherapeutic Counseling Services. Please Review the Following List of Agencies, [employer], Mailed on 07/09/2022: ~ Emergency Assistance Programs in [place] ~ [place] ~ ARAMARK Corporation of [place] ~ [place] Programs ~ Nordstrom Application ~ 0973 Medicaid Tips ~ Apply for Kohl's On-Line Instructions ~ Medicaid Application ~ [place] ~ Air traffic controller  ~ Transportation Options ~ RCATS -  [place] [employer]        Our next appointment is by telephone on 07/18/2022 at 1:30 pm.  Please call the care guide team at [phone] if you need to cancel or reschedule your appointment.   If you are experiencing a Mental Health or [place] or need someone to talk to, please call the Suicide and Crisis Lifeline: 988 call the [place] National Suicide Prevention Lifeline: 669-657-0867 or TTY: 6098718489 TTY (703) 334-8786) to talk to a trained counselor call 1-800-273-TALK (toll free, 24 hour hotline) go to [facility] [address], [place] [phone]) call the [place]: ([phone] call 911  Patient verbalizes understanding of instructions and care plan provided today and agrees to view in [place]. Active MyChart status and patient understanding of how to access instructions and care plan via MyChart confirmed with patient.     Telephone follow up appointment with care management team member scheduled for:  07/18/2022 at 1:30 pm.  Nat Christen, BSW, MSW, [place]  Licensed Clinical Social Worker  [place]  Mailing [place]. [address], [place], [place] [address] Physical Address-[address] [place], [place] [address] Toll Free Main # [phone] Fax # [phone] Cell # [phone] [email or web address]

## 2022-07-12 ENCOUNTER — Ambulatory Visit: Payer: Self-pay | Admitting: *Deleted

## 2022-07-12 NOTE — Patient Outreach (Signed)
  Care Coordination   07/12/2022 Name: Regina Richardson MRN: [id number] DOB: 01-Aug-1960   Care Coordination Outreach Attempts:  An unsuccessful telephone outreach was attempted today to offer the patient information about available care coordination services as a benefit of their health plan.   Follow Up Plan:  Additional outreach attempts will be made to offer the patient care coordination information and services.   Encounter Outcome:  No Answer   Care Coordination Interventions:  No, not indicated     Mark Hassey L. Lavina Hamman, RN, BSN, [place] Coordinator Office number [phone]

## 2022-07-13 ENCOUNTER — Other Ambulatory Visit: Payer: Self-pay | Admitting: Family Medicine

## 2022-07-13 DIAGNOSIS — E782 Mixed hyperlipidemia: Secondary | ICD-10-CM

## 2022-07-17 ENCOUNTER — Other Ambulatory Visit: Payer: Self-pay | Admitting: Gastroenterology

## 2022-07-17 DIAGNOSIS — K269 Duodenal ulcer, unspecified as acute or chronic, without hemorrhage or perforation: Secondary | ICD-10-CM

## 2022-07-18 ENCOUNTER — Encounter: Payer: Self-pay | Admitting: *Deleted

## 2022-07-18 ENCOUNTER — Ambulatory Visit: Payer: Self-pay | Admitting: *Deleted

## 2022-07-18 NOTE — Patient Outreach (Signed)
  Care Coordination   Follow Up Visit Note   07/18/2022  Name: Regina Richardson MRN: [id number] DOB: [birth year]  Regina Richardson is a 61 y.o. year old female who sees Maynez Brooklyn, FNP for primary care. I spoke with Regina Richardson by phone today.  What matters to the patients health and wellness today?  Find Help in My Community.    Goals Addressed               This Visit's Progress     Find Help in My Community. (pt-stated)   On track     Care Coordination Interventions:    Solution-Focused Strategies Implemented. Problem Solving Interventions Employed. Task-Centered Solutions Identified.   Active Listening/Reflection Utilized.  Verbalization of Feelings Encouraged. Emotional Support Provided. Caregiver Stress Acknowledged. Caregiver Resources Reviewed. Self-Enrollment In Caregiver Support Group Discussed. Participation in Counseling Emphasized. Please Consider Self-Enrollment in Alcohol Cessation Classes, From List Provided & Reviewed. Continue to Review the Following List of Agencies, [place] of Interest: ~ Emergency Assistance Programs in [place] ~ [place] ~ ARAMARK Corporation of [place] ~ Clorox Company ~ Nordstrom Application ~ 9450 Medicaid Tips ~ Apply for McDonald's Corporation ~ Kohl's Application ~ [place] ~ Air traffic controller  ~ Transportation Options ~ RCATS - [employer] CSW Directly (# 9317920762), If You Have Questions or Need Assistance with Application Completion & Submission. Please Review the Following List of Agencies, [place] of Interest: ~ Boeing [phone]# [phone]) ~ Goodrich Corporation [phone]) ~ ZSM 2707  (# [phone]) ~ [place] ([phone]# [phone]) ~ [place] 5180608529) ~ Open Door Ministries [phone]# [phone]) ~ Citigroup (361)054-9873) ~ [place] (# [phone]) ~ [place] (# [phone]) ~ [place] [phone]) ~ Aging, [place] of [place] [phone])        SDOH assessments and interventions completed:  Yes.  Care Coordination Interventions:  Yes, provided.   Follow up plan: Follow up call scheduled for 08/01/2022 at 1:30 pm.  Encounter Outcome:  Pt. Visit Completed.   Nat Christen, BSW, MSW, LCSW  Licensed Education officer, environmental [facility] System  Mailing [place] [address], [place], [place] [address] Physical Address-[address], [place],  [address] Toll Free Main # [phone] Fax # [phone] Cell # [phone] Di [email or web address]

## 2022-07-18 NOTE — Patient Instructions (Signed)
Visit Information  Thank you for taking time to visit with me today. Please don't hesitate to contact me if I can be of assistance to you.   Following are the goals we discussed today:   Goals Addressed               This Visit's Progress     Find Help in My Community. (pt-stated)   On track     Care Coordination Interventions:    Solution-Focused Strategies Implemented. Problem Solving Interventions Employed. Task-Centered Solutions Identified.   Active Listening/Reflection Utilized.  Verbalization of Feelings Encouraged. Emotional Support Provided. Caregiver Stress Acknowledged. Caregiver Resources Reviewed. Self-Enrollment In Caregiver Support Group Discussed. Participation in Counseling Emphasized. Please Consider Self-Enrollment in Alcohol Cessation Classes, From List Provided & Reviewed. Continue to Review the Following List of Agencies, [place] of Interest: ~ Emergency Assistance Programs in [place] ~ [place] ~ ARAMARK Corporation of [place] ~ Clorox Company ~ Nordstrom Application ~ 3833 Medicaid Tips ~ Apply for McDonald's Corporation ~ Kohl's Application ~ Home ~ Air traffic controller  ~ Transportation Options ~ RCATS - [employer]T Corporation CSW Directly (# [phone]), If You Have Questions or Need Assistance with Application Completion & Submission. Please Review the Following List of Agencies, [place] of Interest: ~ Boeing [phone]# ([phone]) ~ Goodrich Corporation (917)512-0571) ~ [place] (434) 130-2329 (# [phone]) ~ [place] 515-155-9562# ([phone]) ~ [place] [phone]) [phone]) ~ Open Door  Ministries [phone]# [phone]) ~ Citigroup [phone]) ~ [place] (# [phone]) ~ [place] (# [phone]) ~ [place] [phone]) ~ Aging, [place] of [place] [phone])        Our next appointment is by telephone on 08/01/2022 at 1:30 pm.  Please call the care guide team at [phone] if you need to cancel or reschedule your appointment.   If you are experiencing a Mental Health or [place] or need someone to talk to, please call the Suicide and Crisis Lifeline: 988 call the [place] National Suicide Prevention Lifeline: 769-527-7120 or TTY: 309-606-9175 TTY 580-169-9811) to talk to a trained counselor call 1-800-273-TALK (toll free, 24 hour hotline) go to [facility] [address] [place] [phone]) call the [place]: 2533547888 call 911  Patient verbalizes understanding of instructions and care plan provided today and agrees to view in [place]. Active MyChart status and patient understanding of how to access instructions and care plan via MyChart confirmed with patient.     Telephone follow up appointment with care management team member scheduled for:  08/01/2022 at 1:30 pm.    Nat Christen, BSW, MSW, [place]  Licensed Clinical Social Worker  [place]  Mailing [place]. [address] [place], [place] [address] Physical Address-[address] [place], [place] [address] Toll Free Main # [phone] Fax # [phone] Cell # [phone] [email or web address]

## 2022-07-20 ENCOUNTER — Ambulatory Visit: Payer: Self-pay | Admitting: *Deleted

## 2022-07-20 NOTE — Patient Outreach (Addendum)
  Care Coordination   07/20/2022 Name: Regina Richardson MRN: [id number] DOB: [birth year]   Care Coordination Outreach Attempts:  A second unsuccessful outreach was attempted today to offer the patient with information about available care coordination services as a benefit of their health plan.     Follow Up Plan:  Additional outreach attempts will be made to offer the patient care coordination information and services.   Encounter Outcome:  No Answer   Care Coordination Interventions:  No, not indicated Sent a message to [place], requesting her to encourage the patient to return a call to RN [employer] L. Lavina Hamman, RN, BSN, [place] Coordinator Office number [phone]

## 2022-07-21 ENCOUNTER — Other Ambulatory Visit: Payer: Self-pay | Admitting: Family Medicine

## 2022-07-21 DIAGNOSIS — M545 Low back pain, unspecified: Secondary | ICD-10-CM

## 2022-07-21 DIAGNOSIS — F411 Generalized anxiety disorder: Secondary | ICD-10-CM

## 2022-07-21 DIAGNOSIS — F332 Major depressive disorder, recurrent severe without psychotic features: Secondary | ICD-10-CM

## 2022-07-21 DIAGNOSIS — M4126 Other idiopathic scoliosis, lumbar region: Secondary | ICD-10-CM

## 2022-07-31 ENCOUNTER — Other Ambulatory Visit: Payer: Self-pay

## 2022-07-31 DIAGNOSIS — Z122 Encounter for screening for malignant neoplasm of respiratory organs: Secondary | ICD-10-CM

## 2022-07-31 DIAGNOSIS — Z87891 Personal history of nicotine dependence: Secondary | ICD-10-CM

## 2022-07-31 NOTE — Progress Notes (Signed)
LDCT order placed per protocol °

## 2022-07-31 NOTE — Progress Notes (Signed)
Received referral for initial lung cancer screening scan. Contacted patient and obtained smoking history (started age 62, current smoker continuing to smoke 1/4PPD but previously smoked 1PPD until very recently, 50 pack year) as well as answering questions related to the screening process. Patient denies signs/symptoms of lung cancer such as weight loss or hemoptysis. Patient denies comorbidity that would prevent curative treatment if lung cancer were to be found. Patient is scheduled for shared decision making visit and CT scan on 09/04/2022 at 1530.

## 2022-08-01 ENCOUNTER — Encounter: Payer: Self-pay | Admitting: *Deleted

## 2022-08-01 ENCOUNTER — Ambulatory Visit: Payer: Self-pay | Admitting: *Deleted

## 2022-08-01 NOTE — Patient Instructions (Signed)
Visit Information  Thank you for taking time to visit with me today. Please don't hesitate to contact me if I can be of assistance to you.   Following are the goals we discussed today:   Goals Addressed               This Visit's Progress     Find Help in My Community. (pt-stated)   On track     Care Coordination Interventions:    Active Listening & Reflection Utilized.  Verbalization of Feelings Encouraged. Emotional Support Provided. Solution-Focused Strategies Activated. Problem Solving Interventions Employed. Task-Centered Solutions Implemented.  Please Continue to Consider Self-Enrollment in A Counseling Agency of Interest, from List Provided. Please Continue to Consider Self-Enrollment in A Alcohol Cessation Class of Interest, from List Provided. Please Continue Contacting the Following List of Agencies, In An Effort to [place] [employer] [place] [employer], [place]: ~ [place] ~ Psychologist, counselling) ~ Actuary ~ RCATS - [employer]  Please Continue Contacting the Following List of Agencies, In An Effort to [place]: ~ Boeing (# [phone]) ~ Goodrich Corporation 913-751-2078) ~ Lot 727-655-6945 (# [phone]) ~ [place] [phone]# [phone]) ~ [place] [phone]) [phone]) ~ Open Door Ministries (# [phone]) ~ Citigroup (# ([phone]) ~ [place] (# [phone]) ~ [place] (# [phone]) ~ [place] 614-186-3732) ~ Aging, [place] of [place] ([phone]) ~ Emergency Assistance Programs in [place] ~ [place] ~ ARAMARK Corporation of [place] ~ [place] Programs ~ [place] Application ~ 3790 Medicaid Tips ~ Apply for McDonald's Corporation ~ Medicaid Application Please Complete Application for Cendant Corporation Nutrition Assistance Program (SNAP) & Submit to The [place], for Washington [employer]. Please Complete Application for [place] (RCATS) & Submit to Aging, Disability & Transit Services (ADTS), for Processing. Please Complete Application for Medicaid & Submit to The [place], for Processing.        Our next appointment is by telephone on 08/15/2022 at 3:30 pm.  Please call the care guide team at [phone] if you need to cancel or reschedule your appointment.   If you are experiencing a Mental Health or [place] or need someone to talk to, please call the Suicide and Crisis Lifeline: 988 call the [place] National Suicide Prevention Lifeline: 318-753-8063 or TTY: 8624892107 TTY (850)002-3627) to talk to a trained counselor call 1-800-273-TALK (toll free, 24 hour hotline) go to [facility] Urgent Care [address] [place] [phone]) call the [place]: [phone] call 911  Patient verbalizes understanding of instructions and care plan provided today and agrees to view in [place]. Active MyChart status and patient understanding of how to access instructions and care plan via MyChart confirmed with patient.     Telephone follow up appointment with care management team member scheduled for:  08/15/2022 at 3:30 pm.    Nat Christen, BSW, MSW, [place]  Licensed Clinical Social Worker  [place]  Mailing [place]. [address], [place], [place] [address] Physical Address-[address], [place], [place] [address] Toll Free Main # [phone] Fax # [phone] Cell #  [phone] [email or web address]

## 2022-08-01 NOTE — Patient Outreach (Signed)
  Care Coordination   Follow Up Visit Note   08/01/2022  Name: Regina Richardson MRN: [id number] DOB: 06/06/1961  Regina Richardson is a 62 y.o. year old female who sees Regina Brooklyn, FNP for primary care. I spoke with Regina Richardson by phone today.  What matters to the patients health and wellness today?  Find Help in My Community.    Goals Addressed               This Visit's Progress     Find Help in My Community. (pt-stated)   On track     Care Coordination Interventions:    Active Listening & Reflection Utilized.  Verbalization of Feelings Encouraged. Emotional Support Provided. Solution-Focused Strategies Activated. Problem Solving Interventions Employed. Task-Centered Solutions Implemented.  Please Continue to Consider Self-Enrollment in A Counseling Agency of Interest, from List Provided. Please Continue to Consider Self-Enrollment in A Alcohol Cessation Class of Interest, from List Provided. Please Continue Contacting the Following List of Agencies, In An Effort to [place], [place]: ~ [place] ~ Psychologist, counselling) ~ Actuary ~ RCATS - [employer]  Please Continue Contacting the Following List of Agencies, In An Effort to [place]: ~ Boeing (# [phone]) ~ Goodrich Corporation [phone]) ~ Lot 628-470-1456 (# [phone]) ~ [place] [phone]# [phone]) ~ [place] [phone]) ~ Open Door Ministries (# 609-742-2863) ~ Citigroup (# [phone]) ~ [place] (# [phone]) ~ [place] (# 706-772-4531) ~ Ricky [place] [phone]) ~ Aging, [place] of [place] (531)136-4150) ~ Emergency Assistance Programs in [place] ~ [place] ~ ARAMARK Corporation of [place] ~ [place] Programs ~ [place] Application ~ 2426 Medicaid Tips ~ Apply for McDonald[employer] Corporation ~ Medicaid Application Please Complete Application for Cendant Corporation Nutrition Assistance Program (SNAP) & Submit to The [place], for Washington Mutual. Please Complete Application for [place] (RCATS) & Submit to Aging, Disability & Transit Services (ADTS), for Processing. Please Complete Application for Medicaid & Submit to The [place], for Processing.        SDOH assessments and interventions completed:  Yes.  Care Coordination Interventions:  Yes, provided.   Follow up plan: Follow up call scheduled for 08/15/2022 at 3:30 pm.  Encounter Outcome:  Pt. Visit Completed.   Nat Christen, BSW, MSW, LCSW  Licensed Education officer, environmental Health System  Mailing [place] [address]. [address], [place], [place] [address] Physical Address-[address], [place], [place] [address] Toll Free Main # [phone] Fax # [phone] Cell # [phone] Di [email or web address]

## 2022-08-10 ENCOUNTER — Ambulatory Visit: Payer: Self-pay | Admitting: *Deleted

## 2022-08-10 NOTE — Patient Outreach (Signed)
  Care Coordination   08/10/2022 Name: NAINIKA NEWLUN MRN: [id number] DOB: Dec 09, 1960   Care Coordination Outreach Attempts:  An unsuccessful telephone outreach was attempted for a scheduled appointment today.  Follow Up Plan:  Additional outreach attempts will be made to offer the patient care coordination information and services.   Encounter Outcome:  No Answer   Care Coordination Interventions:  No, not indicated    Valente David, RN, MSN, [facility] Care Management Care Management Coordinator [phone]

## 2022-08-11 ENCOUNTER — Telehealth: Payer: Self-pay | Admitting: Gastroenterology

## 2022-08-11 NOTE — Telephone Encounter (Signed)
Please let patient know the following:  -->Discussed with Dr. Abbey Chatters and Dr. Rush Landmark in [place], consensus is that she can have her next EGD here locally with Dr. Abbey Chatters instead of going back to [facility]. -->Please NIC for EGD in 04/2023. -->remind her to complete labs that were ordered at time of her last ov -->she still needs to have H.pylori stool antigen and gastrin level OFF acid reflux medications such as pantoprazole (I believe this is what she is currently own assuming med list is correct) -->please order gastrin level and H.pylori stool antigen -->arrange ov in four months with Dr. Abbey Chatters

## 2022-08-13 ENCOUNTER — Encounter: Payer: Self-pay | Admitting: Family Medicine

## 2022-08-13 ENCOUNTER — Other Ambulatory Visit: Payer: Self-pay

## 2022-08-13 ENCOUNTER — Ambulatory Visit (INDEPENDENT_AMBULATORY_CARE_PROVIDER_SITE_OTHER): Payer: Medicare HMO | Admitting: Family Medicine

## 2022-08-13 VITALS — BP 168/79 | HR 64 | Temp 97.6°F | Ht 60.0 in | Wt 157.2 lb

## 2022-08-13 DIAGNOSIS — F418 Other specified anxiety disorders: Secondary | ICD-10-CM | POA: Diagnosis not present

## 2022-08-13 DIAGNOSIS — I10 Essential (primary) hypertension: Secondary | ICD-10-CM | POA: Diagnosis not present

## 2022-08-13 DIAGNOSIS — R198 Other specified symptoms and signs involving the digestive system and abdomen: Secondary | ICD-10-CM

## 2022-08-13 DIAGNOSIS — F332 Major depressive disorder, recurrent severe without psychotic features: Secondary | ICD-10-CM

## 2022-08-13 DIAGNOSIS — K7030 Alcoholic cirrhosis of liver without ascites: Secondary | ICD-10-CM

## 2022-08-13 MED ORDER — QUETIAPINE FUMARATE 50 MG PO TABS: 50.0000 mg | ORAL_TABLET | Freq: Every day | ORAL | 1 refills | Status: DC

## 2022-08-13 MED ORDER — METOPROLOL SUCCINATE ER 100 MG PO TB24: 100.0000 mg | ORAL_TABLET | Freq: Every day | ORAL | 1 refills | Status: DC

## 2022-08-13 NOTE — Telephone Encounter (Signed)
Pt was made aware and verbalized understanding. Labs were ordered and previous lab req was printed and mailed to pt to have completed.

## 2022-08-13 NOTE — Progress Notes (Signed)
Subjective:  Patient ID: Regina Richardson, female    DOB: 1961-07-07  Age: 62 y.o. MRN: [id number]  CC: Medical Management of Chronic Issues   HPI Regina Richardson presents for follow up of depression, alcoholism and PUD. Stopped all alcohol 4-5 mos ago. Went through withdrawal without medical supervision.   Son is on drugs. Worried about him. He has been violent. Cusses her out. Makes her sick to just look at him. Three or four of his friends have died of fentanyl overdose.      08/13/2022   10:11 AM 07/08/2022    8:52 PM 05/25/2022    6:19 PM  Depression screen PHQ 2/9  Decreased Interest '3 1 3  '$ Down, Depressed, Hopeless '3 1 3  '$ PHQ - 2 Score '6 2 6  '$ Altered sleeping '3 1 3  '$ Tired, decreased energy '3 1 3  '$ Change in appetite '3 1 2  '$ Feeling bad or failure about yourself  '3 1 2  '$ Trouble concentrating '3 1 3  '$ Moving slowly or fidgety/restless 1 0 2  Suicidal thoughts '1 1 1  '$ PHQ-9 Score '23 8 22  '$ Difficult doing work/chores Very difficult Somewhat difficult Somewhat difficult      08/13/2022   10:12 AM 04/10/2022   10:08 AM 01/04/2022    2:25 PM 11/15/2021    4:04 PM  GAD 7 : Generalized Anxiety Score  Nervous, Anxious, on Edge '3 3 3 3  '$ Control/stop worrying '3 3 3 3  '$ Worry too much - different things '3 3 3 3  '$ Trouble relaxing '3 3 3 3  '$ Restless '3 1 1 3  '$ Easily annoyed or irritable '3 3 3 3  '$ Afraid - awful might happen '3 3 3 1  '$ Total GAD 7 Score '21 19 19 19  '$ Anxiety Difficulty Very difficult Somewhat difficult Extremely difficult      History Regina Richardson has a past medical history of Alcoholism ([place]), Anxiety, Arrhythmia, Arthritis, Cancer ([place]), Cirrhosis ([place]) (04/2020), Depression, Dysrhythmia, GERD (gastroesophageal reflux disease), Hypertension, Osteoporosis, Scoliosis, and Urinary incontinence.   She has a past surgical history that includes Total abdominal hysterectomy; left collar bone surgery; left forearm surgery; Colonoscopy (08/2017); Cesarean section; Colposcopy; Cervical  biopsy w/ loop electrode excision; Hemorrhoid surgery; Esophagogastroduodenoscopy (08/2017); Finger surgery; Esophagogastroduodenoscopy (N/A, 06/15/2020); Esophageal dilation (N/A, 06/15/2020); Colonoscopy (N/A, 06/15/2020); Esophagogastroduodenoscopy (egd) with propofol (N/A, 10/11/2020); biopsy (10/11/2020); Upper esophageal endoscopic ultrasound (eus) (N/A, 12/12/2020); biopsy (12/12/2020); Esophagogastroduodenoscopy (egd) with propofol (N/A, 12/12/2020); EGD with mucosal resection (03/16/2021); Esophagogastroduodenoscopy (egd) with propofol (N/A, 04/27/2022); and biopsy (04/27/2022).   Her family history includes Alcohol abuse in her father and mother; Cirrhosis in her brother; Diabetes in her cousin, maternal aunt, and sister; Heart disease in her father; Kidney disease in her maternal aunt; Liver disease in her brother, mother, and sister; Lung cancer in her father.She reports that she has been smoking cigarettes. She has a 20.00 pack-year smoking history. She has been exposed to tobacco smoke. She has never used smokeless tobacco. She reports that she does not currently use alcohol after a past usage of about 12.0 standard drinks of alcohol per week. She reports that she does not use drugs.    ROS Review of Systems  Constitutional: Negative.   HENT: Negative.    Eyes:  Negative for visual disturbance.  Respiratory:  Negative for shortness of breath.   Cardiovascular:  Negative for chest pain.  Gastrointestinal:  Negative for abdominal pain.  Musculoskeletal:  Negative for arthralgias.  Psychiatric/Behavioral:  Positive for agitation and  dysphoric mood. The patient is nervous/anxious.     Objective:  BP (!) 168/79   Pulse 64   Temp 97.6 F (36.4 C)   Ht 5' (1.524 m)   Wt 157 lb 3.2 oz (71.3 kg)   SpO2 99%   BMI 30.70 kg/m   BP Readings from Last 3 Encounters:  08/13/22 (!) 168/79  05/22/22 (!) 153/89  05/03/22 125/66    Wt Readings from Last 3 Encounters:  08/13/22 157 lb 3.2  oz (71.3 kg)  05/22/22 155 lb (70.3 kg)  05/03/22 154 lb 12.8 oz (70.2 kg)     Physical Exam Constitutional:      General: She is not in acute distress.    Appearance: She is well-developed.  Cardiovascular:     Rate and Rhythm: Normal rate and regular rhythm.  Pulmonary:     Breath sounds: Normal breath sounds.  Musculoskeletal:        General: Normal range of motion.  Skin:    General: Skin is warm and dry.  Neurological:     Mental Status: She is alert and oriented to person, place, and time.       Assessment & Plan:   Regina Richardson was seen today for medical management of chronic issues.  Diagnoses and all orders for this visit:  Alcoholic cirrhosis of liver without ascites ([place])  Major depressive disorder, recurrent severe without psychotic features ([place])  Depression with anxiety -     QUEtiapine (SEROQUEL) 50 MG tablet; Take 1 tablet (50 mg total) by mouth at bedtime.  Essential hypertension -     metoprolol succinate (TOPROL-XL) 100 MG 24 hr tablet; Take 1 tablet (100 mg total) by mouth daily.       I have changed Regina Richardson's QUEtiapine and metoprolol succinate. I am also having her maintain her methocarbamol, ondansetron, pantoprazole, folic acid, buPROPion, sucralfate, allopurinol, rosuvastatin, Vilazodone HCl, and diclofenac.  Allergies as of 08/13/2022   No Known Allergies      Medication List        Accurate as of August 13, 2022 12:21 PM. If you have any questions, ask your nurse or doctor.          allopurinol 300 MG tablet Commonly known as: ZYLOPRIM TAKE 1 TABLET BY MOUTH EVERY DAY   buPROPion 150 MG 12 hr tablet Commonly known as: WELLBUTRIN SR Take 1 tablet (150 mg total) by mouth 2 (two) times daily.   diclofenac 75 MG EC tablet Commonly known as: VOLTAREN TAKE 1 TABLET BY MOUTH TWICE A DAY AS NEEDED   folic acid 1 MG tablet Commonly known as: FOLVITE Take 1 tablet (1 mg total) by mouth daily.   methocarbamol 500 MG  tablet Commonly known as: Robaxin Take 1 tablet (500 mg total) by mouth every 8 (eight) hours as needed for muscle spasms.   metoprolol succinate 100 MG 24 hr tablet Commonly known as: TOPROL-XL Take 1 tablet (100 mg total) by mouth daily. What changed:  medication strength how much to take Changed by: [name], MD   ondansetron 4 MG disintegrating tablet Commonly known as: ZOFRAN-ODT Take 1 tablet (4 mg total) by mouth every 8 (eight) hours as needed for nausea or vomiting.   pantoprazole 40 MG tablet Commonly known as: PROTONIX Take 1 tablet (40 mg total) by mouth 2 (two) times daily.   QUEtiapine 50 MG tablet Commonly known as: SEROQUEL Take 1 tablet (50 mg total) by mouth at bedtime. Started by: [name], MD  rosuvastatin 5 MG tablet Commonly known as: CRESTOR TAKE 1 TABLET (5 MG TOTAL) BY MOUTH DAILY.   sucralfate 1 g tablet Commonly known as: CARAFATE Take 1 tablet (1 g total) by mouth 4 (four) times daily -  with meals and at bedtime.   Vilazodone HCl 20 MG Tabs TAKE 1 TABLET BY MOUTH EVERY DAY         Follow-up: Return in about 1 month (around 09/13/2022).  [name], M.D.

## 2022-08-15 ENCOUNTER — Ambulatory Visit: Payer: Self-pay | Admitting: *Deleted

## 2022-08-15 ENCOUNTER — Encounter: Payer: Self-pay | Admitting: *Deleted

## 2022-08-15 DIAGNOSIS — R198 Other specified symptoms and signs involving the digestive system and abdomen: Secondary | ICD-10-CM | POA: Diagnosis not present

## 2022-08-15 NOTE — Patient Instructions (Signed)
Visit Information  Thank you for taking time to visit with me today. Please don't hesitate to contact me if I can be of assistance to you.   Following are the goals we discussed today:   Goals Addressed               This Visit's Progress     Find Help in My Community. (pt-stated)   On track     Care Coordination Interventions:    Solution-Focused Strategies Enacted Problem Solving Interventions Activated. Task-Centered Solutions Employed. Active Listening & Reflection Utilized.  Verbalization of Feelings Encouraged. Emotional & Caregiver Support Provided. Feelings of Frustration Validated. Caregiver Resources Reviewed. Self-Enrollment in Caregiver Support Group Emphasized, from List Provided. Acceptance & Commitment Therapy Introduced. Brief Cognitive Behavioral Therapy Performed. Client-Centered Therapy Initiated. Assessed Homicidal & Suicidal Ideations - None Present. Assessed for Weapons in The Home - None Present. Assessed Domestic Violence, Past & Present - Denied. Crisis Resources, Supportive Services & Agencies Discussed.  Please Continue to Consider Self-Enrollment in A Counseling Agency of Interest, from List Provided. ~ Psychiatrist for Psychotropic Medication Management. ~ Transport planner for [employer]. Please Continue to Consider Self-Enrollment in An Alcohol Cessation Class of Interest, from List Provided.   ~ Counselling psychologist for Express Scripts.   ~ Counselling psychologist for Son. Please Continue Contacting the Following List of Agencies, In An Effort to [place] [employer] [place] [employer], [place]: ~ [place] ~ [place] (SNAP) Land  ~ Actuary ~ [place] [employer] (RCATS) Application  Please Continue Contacting the Following List of Agencies, In An Effort to [place]: ~ Boeing [phone]# [phone]) ~ Goodrich Corporation [phone]) ~ Lot 203-020-2250 (# [phone]) ~ [place] [phone]# [phone]) ~ [place] [phone]) ~ Open Door Ministries (# [phone]) ~ Citigroup (# [phone]) ~ [place] (# 9178649856) ~ [place] (# [phone]) ~ Ricky Ala [phone]) ~ Aging, [place] of [place] [phone]) ~ Emergency Assistance Programs in [place] ~ [place] ~ ARAMARK Corporation of [place] ~ [place] Programs ~ [place] Application ~ 8882 Medicaid Tips ~ Apply for McDonald's Corporation ~ Medicaid Application Please Complete Application for Cendant Corporation Nutrition Assistance Program (SNAP) & Submit to The [place] [phone]), for Processing. Please Complete Application for [place] to Aging, [place] [phone]), for Processing. Please Complete Application for Medicaid & Submit to The [place] [phone]), for Processing. Please Contact CSW Directly (# [phone]), if You Have Questions or Need Assistance with Application Completion & Submission. Please Continue to Administer Psychotropic Medications Exactly As Prescribed. ~ Primary Care Provider, Dr. [name] with [facility] [phone]), Prescribed Quetiapine (SEROQUEL) 50 MG Tablet; Take 1 Tablet (50 MG Total), By Mouth at Bedtime, on 08/13/2022.        Our next appointment is by telephone on 09/05/2022 at 9:45 am.  Please call the care guide team at [phone] if you need to cancel or reschedule your appointment.   If you are  experiencing a Mental Health or [place] or need someone to talk to, please call the Suicide and Crisis Lifeline: 988 call the [place]  National Suicide Prevention Lifeline: 318-846-0399 or TTY: 6204153903 TTY 415-685-5227) to talk to a trained counselor call 1-800-273-TALK (toll free, 24 hour hotline) go to [facility] Urgent Care [address] [place] [phone]) call the [place]: [phone] call 911  Patient verbalizes understanding of instructions and care plan provided today and agrees to view in [place]. Active MyChart status and patient understanding of how to access instructions and care plan via MyChart confirmed with patient.     Telephone follow up appointment with care management team member scheduled for:  09/05/2022 at 9:45 am.  Nat Christen, BSW, MSW, [place]  Licensed Clinical Social Worker  [place]  Mailing [place]. [address], [place], [place] [address] Physical Address-[address], [place], [place] [address] Toll Free Main # [phone] Fax # [phone] Cell # [phone] Di [email or web address]

## 2022-08-15 NOTE — Patient Outreach (Signed)
Care Coordination   Follow Up Visit Note   08/15/2022  Name: Regina Richardson MRN: [id number] DOB: Sep 17, 1960  Regina Richardson is a 62 y.o. year old female who sees [name], MD for primary care. I spoke with [name] by phone today.  What matters to the patients health and wellness today?  Find Help in My Community.    Goals Addressed               This Visit's Progress     Find Help in My Community. (pt-stated)   On track     Care Coordination Interventions:    Solution-Focused Strategies Enacted Problem Solving Interventions Activated. Task-Centered Solutions Employed. Active Listening & Reflection Utilized.  Verbalization of Feelings Encouraged. Emotional & Caregiver Support Provided. Feelings of Frustration Validated. Caregiver Resources Reviewed. Self-Enrollment in Caregiver Support Group Emphasized, from List Provided. Acceptance & Commitment Therapy Introduced. Brief Cognitive Behavioral Therapy Performed. Client-Centered Therapy Initiated. Assessed Homicidal & Suicidal Ideations - None Present. Assessed for Weapons in The Home - None Present. Assessed Domestic Violence, Past & Present - Denied. Crisis Resources, Supportive Services & Agencies Discussed.  Please Continue to Consider Self-Enrollment in A Counseling Agency of Interest, from List Provided. ~ Psychiatrist for Psychotropic Medication Management. ~ Transport planner for [employer]. Please Continue to Consider Self-Enrollment in An Alcohol Cessation Class of Interest, from List Provided.   ~ Counselling psychologist for Express Scripts.   ~ Counselling psychologist for Son. Please Continue Contacting the Following List of Agencies, In An Effort to [place] [employer] [place], [place]: ~ [place] ~ [place] (SNAP) Land  ~ Actuary ~ Gannett [employer] (RCATS) Application  Please Continue Contacting the Following List of Agencies, In An Effort to [place]: ~ Boeing [phone]# [phone]) ~ Goodrich Corporation [phone]) ~ Lot 236-496-9771 (# [phone]) ~ [place] [phone]# [phone]) ~ [place] [phone]) ~ Open Door Ministries (# 478-366-9692) ~ Citigroup (# [phone]) ~ [place] (# [phone]) ~ [place] (# 772-232-1636) ~ Ricky Ala (207)695-1887) ~ Aging, [place] of [place] [phone]) ~ Emergency Assistance Programs in [place] ~ [place] ~ ARAMARK Corporation of [place] ~ [place] Programs ~ [place] Application ~ 0349 Medicaid Tips ~ Apply for McDonald's Corporation ~ Medicaid Application Please Complete Application for Cendant Corporation Nutrition Assistance Program (SNAP) & Submit to The [place] [phone]), for Processing. Please Complete Application for [place] to Aging, [place] [phone]), for Processing. Please Complete Application for Medicaid & Submit to The [place] [phone]), for Processing. Please Contact CSW Directly (# [phone]), if You Have Questions or Need Assistance with Application Completion & Submission. Please Continue to Administer Psychotropic Medications Exactly As Prescribed. ~ Primary Care Provider, Dr. [name] with [facility] [phone]), Prescribed Quetiapine (SEROQUEL) 50 MG Tablet; Take 1 Tablet (50 MG Total), By Mouth at Bedtime, on 08/13/2022.        SDOH assessments and interventions  completed:  Yes.  Care Coordination Interventions:  Yes, provided.   Follow up plan: Follow up call scheduled for 09/05/2022 at 9:45 am.  Encounter  Outcome:  Pt. Visit Completed.   Nat Christen, BSW, MSW, LCSW  Licensed Education officer, environmental Health System  Mailing [place] [address]. [address], [place], [place] [address] Physical Address-[address], [place], [place] [address] Toll Free Main # [phone] Fax # [phone] Cell # [phone] Di [email or web address]

## 2022-08-18 LAB — GASTRIN: Gastrin: 45 pg/mL (ref 0–115)

## 2022-08-21 ENCOUNTER — Other Ambulatory Visit: Payer: Self-pay | Admitting: Family Medicine

## 2022-08-21 DIAGNOSIS — Z1231 Encounter for screening mammogram for malignant neoplasm of breast: Secondary | ICD-10-CM

## 2022-08-22 ENCOUNTER — Other Ambulatory Visit: Payer: Self-pay | Admitting: Family Medicine

## 2022-08-22 DIAGNOSIS — M545 Low back pain, unspecified: Secondary | ICD-10-CM

## 2022-08-22 DIAGNOSIS — M4126 Other idiopathic scoliosis, lumbar region: Secondary | ICD-10-CM

## 2022-08-27 DIAGNOSIS — C159 Malignant neoplasm of esophagus, unspecified: Secondary | ICD-10-CM | POA: Diagnosis not present

## 2022-08-27 DIAGNOSIS — K269 Duodenal ulcer, unspecified as acute or chronic, without hemorrhage or perforation: Secondary | ICD-10-CM | POA: Diagnosis not present

## 2022-08-27 DIAGNOSIS — R198 Other specified symptoms and signs involving the digestive system and abdomen: Secondary | ICD-10-CM | POA: Diagnosis not present

## 2022-08-27 DIAGNOSIS — K7030 Alcoholic cirrhosis of liver without ascites: Secondary | ICD-10-CM | POA: Diagnosis not present

## 2022-08-28 ENCOUNTER — Other Ambulatory Visit: Payer: Self-pay | Admitting: Gastroenterology

## 2022-08-28 ENCOUNTER — Other Ambulatory Visit: Payer: Self-pay | Admitting: Family Medicine

## 2022-08-28 DIAGNOSIS — R198 Other specified symptoms and signs involving the digestive system and abdomen: Secondary | ICD-10-CM | POA: Diagnosis not present

## 2022-08-28 DIAGNOSIS — F332 Major depressive disorder, recurrent severe without psychotic features: Secondary | ICD-10-CM

## 2022-08-28 DIAGNOSIS — F411 Generalized anxiety disorder: Secondary | ICD-10-CM

## 2022-08-29 LAB — H. PYLORI ANTIGEN, STOOL: H pylori Ag, Stl: NEGATIVE

## 2022-08-30 LAB — COMPREHENSIVE METABOLIC PANEL
ALT: 32 IU/L (ref 0–32)
AST: 41 IU/L — ABNORMAL HIGH (ref 0–40)
Albumin/Globulin Ratio: 1.2 (ref 1.2–2.2)
Albumin: 4.3 g/dL (ref 3.9–4.9)
Alkaline Phosphatase: 173 IU/L — ABNORMAL HIGH (ref 44–121)
BUN/Creatinine Ratio: 12 (ref 12–28)
BUN: 8 mg/dL (ref 8–27)
Bilirubin Total: 0.2 mg/dL (ref 0.0–1.2)
CO2: 22 mmol/L (ref 20–29)
Calcium: 9.5 mg/dL (ref 8.7–10.3)
Chloride: 99 mmol/L (ref 96–106)
Creatinine, Ser: 0.68 mg/dL (ref 0.57–1.00)
Globulin, Total: 3.7 g/dL (ref 1.5–4.5)
Glucose: 102 mg/dL — ABNORMAL HIGH (ref 70–99)
Potassium: 3.1 mmol/L — ABNORMAL LOW (ref 3.5–5.2)
Sodium: 137 mmol/L (ref 134–144)
Total Protein: 8 g/dL (ref 6.0–8.5)
eGFR: 98 mL/min/{1.73_m2} (ref >59)

## 2022-08-30 LAB — MITOCHONDRIAL ANTIBODIES: Mitochondrial Ab: <20 Units (ref 0.0–20.0)

## 2022-08-30 LAB — HEPATITIS A ANTIBODY, TOTAL: hep A Total Ab: POSITIVE — AB

## 2022-08-30 LAB — PROTIME-INR
INR: 1.1 (ref 0.9–1.2)
Prothrombin Time: 11.6 s (ref 9.1–12.0)

## 2022-08-30 LAB — IGG, IGA, IGM
IgA/Immunoglobulin A, Serum: 688 mg/dL — ABNORMAL HIGH (ref 87–352)
IgG (Immunoglobin G), Serum: 1789 mg/dL — ABNORMAL HIGH (ref 586–1602)
IgM (Immunoglobulin M), Srm: 146 mg/dL (ref 26–217)

## 2022-08-30 LAB — HEPATITIS B SURFACE ANTIBODY,QUALITATIVE: Hep B Surface Ab, Qual: NONREACTIVE

## 2022-08-30 LAB — GASTRIN: Gastrin: 19 pg/mL (ref 0–115)

## 2022-08-30 LAB — ANA: ANA Titer 1: NEGATIVE

## 2022-08-30 LAB — ANTI-SMOOTH MUSCLE ANTIBODY, IGG: Smooth Muscle Ab: 15 Units (ref 0–19)

## 2022-09-03 ENCOUNTER — Ambulatory Visit (INDEPENDENT_AMBULATORY_CARE_PROVIDER_SITE_OTHER): Payer: Medicare HMO | Admitting: *Deleted

## 2022-09-03 DIAGNOSIS — Z23 Encounter for immunization: Secondary | ICD-10-CM | POA: Diagnosis not present

## 2022-09-03 NOTE — Progress Notes (Signed)
Patient received Hep B vaccination per her request. Left Deltoid. Tolerated well.

## 2022-09-04 ENCOUNTER — Inpatient Hospital Stay: Payer: Medicare HMO | Attending: Physician Assistant | Admitting: Physician Assistant

## 2022-09-04 ENCOUNTER — Ambulatory Visit (HOSPITAL_COMMUNITY)
Admission: RE | Admit: 2022-09-04 | Discharge: 2022-09-04 | Disposition: A | Discharge status: Home / Self Care | Payer: Medicare HMO | Source: Ambulatory Visit | Attending: Physician Assistant | Admitting: Physician Assistant

## 2022-09-04 ENCOUNTER — Telehealth: Payer: Self-pay | Admitting: *Deleted

## 2022-09-04 ENCOUNTER — Other Ambulatory Visit: Payer: Self-pay | Admitting: Family Medicine

## 2022-09-04 ENCOUNTER — Ambulatory Visit: Payer: Medicare HMO

## 2022-09-04 DIAGNOSIS — Z87891 Personal history of nicotine dependence: Secondary | ICD-10-CM | POA: Diagnosis not present

## 2022-09-04 DIAGNOSIS — Z122 Encounter for screening for malignant neoplasm of respiratory organs: Secondary | ICD-10-CM | POA: Insufficient documentation

## 2022-09-04 DIAGNOSIS — F17210 Nicotine dependence, cigarettes, uncomplicated: Secondary | ICD-10-CM

## 2022-09-04 DIAGNOSIS — F418 Other specified anxiety disorders: Secondary | ICD-10-CM

## 2022-09-04 NOTE — Progress Notes (Signed)
  Care Coordination Note  09/04/2022 Name: [name] MRN: [id number] DOB: 02/21/1961  Regina Richardson is a 62 y.o. year old female who is a primary care patient of Stacks, Cletus Gash, MD and is actively engaged with the care management team. I reached out to Regina Richardson by phone today to assist with scheduling a follow up visit with the RN Case Manager  Follow up plan: Unsuccessful telephone outreach attempt made.   [place]  Direct Dial: [phone]

## 2022-09-04 NOTE — Progress Notes (Signed)
[name]  Visit Date: 09/04/22  Visit Type: In-Person at [place] DECISION-MAKING VISIT - Age: 62 y.o.  - Pack year smoking history: 50 pack-years  - Type of tobacco abuse: Cigarettes - Current smoker or < 15 years of cessation: Current 0.25 PPD - No current symptoms of lung cancer:  Patient denies any hemoptysis, unintentional weight loss, and unexplained cough - Risks and benefits of lung cancer screening discussed: Negative: Over-diagnosis, radiation exposure, false positives, and additional testing Positive: Discover early stage lung cancer resulting in higher incidence of cure - Patient educated regarding the importance of adherence to continued lung cancer screening. - Currently, there are no co-morbidities to prevent treatment to therapy for lung cancer and the patient is agreeable to pursue treatment if a malignancy is discovered.  [place] Preventative Services Task Force recommend annual screening for lung cancer with low-dose CT in adults aged 11 - 65 years who have a 20+ pack year smoking history and currently smoke or have quit smoking within the past 15 years.  Screening should be discontinued once a person has not smoked for 15 years or develops a health problem that substantially limits life expectancy or the ability or willingness to have curative lung surgery.  It is a category B recommendation.  Similar stances are provided by CMS, NCCN, and AATS.  Social History   Tobacco Use   Smoking status: Every Day    Packs/day: 0.50    Years: 40.00    Total pack years: 20.00    Types: Cigarettes    Passive exposure: Current   Smokeless tobacco: Never   Tobacco comments:    Smoking Cessation Offered.  Vaping Use   Vaping Use: Never used  Substance Use Topics   Alcohol use: Not Currently    Alcohol/week: 12.0 standard drinks of alcohol    Types: 12 Cans of beer per week    Comment: 12 cans of beer daily   Drug use: No     Comment: Tried cocaine and marijuana in the 60s.      Personal history of tobacco use presenting hazards to health: - This patient meets criteria for low-dose CT lung cancer screening  - The shared decision making visit discussion included risks and benefits of screening, potential for follow-up, diagnostic testing for abnormal scans, potential for false positive tests, overdiagnosis, discussion about total radiation exposure - Patient stated willingness to undergo diagnostics and treatment as needed - Patient was counseled on smoking cessation to decrease the  risk of lung cancer, pulmonary disease, heart disease, and stroke - Patient has been referred to Lung Cancer Screening Nurse Coordinator for further scheduling of LDCT and for further resources regarding free nicotine replacement therapy and information about smoking cessation classes - Counseling on the importance of adherence to annual lung cancer LDCT screening, impact of co-morbidities, and ability or willingness to undergo diagnosis and treatment is imperative for compliance of the program. - Counseling on the importance of continued smoking cessation for former smokers; the importance of smoking cessation for current smokers, and information about tobacco cessation interventions have been given to patient including [place] and 1-800-quit  programs.  Yearly follow up will be coordinated by Adonis Huguenin, RN, MSN [facility] Oncology Nurse Navigator.)  Harriett Rush, PA-C  09/04/22 3:42 PM

## 2022-09-04 NOTE — Patient Instructions (Signed)
You were seen today for your shared-decision making visit with initial low-dose CT scan.  Thank you for your participation in this lifesaving program!

## 2022-09-05 ENCOUNTER — Ambulatory Visit: Payer: Self-pay | Admitting: *Deleted

## 2022-09-05 ENCOUNTER — Encounter: Payer: Self-pay | Admitting: *Deleted

## 2022-09-05 NOTE — Patient Instructions (Signed)
Visit Information  Thank you for taking time to visit with me today. Please don't hesitate to contact me if I can be of assistance to you.   Following are the goals we discussed today:   Goals Addressed               This Visit's Progress     COMPLETED: Find Help in My Community. (pt-stated)   On track     Care Coordination Interventions:  Interventions Today    Flowsheet Row Most Recent Value  Chronic Disease   Chronic disease during today's visit Hypertension (HTN), Other  [Major Depressive Disorder, Recurrent Severe Without Psychotic Features, Insomnia, Generalized Anxiety Disorder, Alcohol Abuse, Tobacco Abuse & Chronic Pain]  General Interventions   General Interventions Discussed/Reviewed General Interventions Discussed, General Interventions Reviewed, Annual Eye Exam, Labs, Durable Medical Equipment (DME), Vaccines, Health Screening, [employer], Doctor Visits, Communication with  [facility] Care Provider & Pharmacist]  Labs Hgb A1c annually  Vaccines COVID-19, Flu, Pneumonia, RSV, Shingles  Doctor Visits Discussed/Reviewed Doctor Visits Discussed, Doctor Visits Reviewed, Annual Wellness Visits, PCP, Specialist  Health Screening Colonoscopy, Mammogram  Durable Medical Equipment (DME) BP Cuff, Walker  PCP/Specialist Visits Compliance with follow-up visit  Communication with PCP/Specialists, Pharmacists  Exercise Interventions   Exercise Discussed/Reviewed Exercise Discussed, Exercise Reviewed, Physical Activity, Weight Managment  Physical Activity Discussed/Reviewed Physical Activity Discussed, Physical Activity Reviewed, Types of exercise  Weight Management Weight maintenance  Education Interventions   Education Provided Provided Printed Education, Provided Web-based Education, Provided Education  Provided Verbal Education On Nutrition, Mental Health/Coping with Illness, Applications, Exercise, Medication, When to see the doctor, [employer], [place] Discussed, Mental Health Reviewed, Coping Strategies, Crisis, Anxiety, Depression, Substance Abuse, Suicide  Nutrition Interventions   Nutrition Discussed/Reviewed Nutrition Discussed, Nutrition Reviewed, Fluid intake  Pharmacy Interventions   Pharmacy Dicussed/Reviewed Pharmacy Topics Discussed, Pharmacy Topics Reviewed, Medications and their functions, Medication Adherence, Affording Medications, Referral to Pharmacist  Medication Adherence Not taking medication, Unable to refill medication  Referral to Pharmacist Cannot afford medications  Safety Interventions   Safety Discussed/Reviewed Safety Discussed, Safety Reviewed, Fall Risk  Advanced Directive Interventions   Advanced Directives Discussed/Reviewed Advanced Directives Discussed, Advanced Directives Reviewed      Active Listening & Reflection Utilized.  Verbalization of Feelings Encouraged. Emotional Support Provided. Feelings of Frustration Validated. Caregiver Stress Acknowledged. Caregiver Resources Reviewed. Self-Enrollment in Caregiver Support Group of Interest Emphasized, from List Provided. Solution-Focused Strategies Enacted. Problem Solving Interventions Employed. Task-Centered Solutions Revised. Acceptance & Commitment Therapy Implemented. Cognitive Behavioral Therapy Performed. Client-Centered Therapy Initiated. Homicidal & Suicidal Ideations Assessed - None Present. Accessible Weapons in [place] - None Present. Domestic Violence Assessed - None Present.   Crisis Support, Services, Agencies & Resources Reviewed. Please Consider Attending an Alcoholics Anonymous Support Group of Interest, from List Provided. Please Consider Self-Enrollment in A Counseling Agency of Interest, from List Provided. ~ Psychiatrist for Psychotropic Medication Management. ~ Transport planner for [employer]. Please Contact the  Following List of Agencies, In An Effort to The [place] [employer] [place] [employer], [place]: ~ [place] ~ [place] ([employer]) Land  ~ Actuary ~ [employer] (RCATS) Metallurgist the Following List of Agencies, In An Effort to [place]: ~ Boeing (# 4051515659) ~ [employer] [phone]# [phone]) ~ [id number] (# [phone]) ~  [place] [phone]# [phone]) ~ [place] [phone]) ~ [employer] [phone]# [phone]) ~ [employer] [phone]) ~ [place] (# [phone]) ~ [place] (# [phone]) ~ [place] [phone]) ~ Aging, [place] of [place] 2165687976) ~ Emergency Assistance Programs in [place] ~ [place] ~ [place] [employer] of [place] ~ [place] Programs ~ [place] Application ~ [id number] Medicaid Tips ~ Apply for [employer] On-Line Instructions ~ Medicaid Application Please Complete Application for Supplemental Nutrition Assistance Program & Submit to The [place] [phone]), for Processing. Please Complete Application for [place] to Aging, [place] [phone]), for Processing. Please Complete Application for Medicaid & Submit to The [place] [phone]), for Processing. Please Contact CSW Directly [id number]# [phone]), if You Have Questions, Need Assistance with Application Completion & Submission, or If Additional Social Work Needs Are Identified in The Near Future.      Please call the care  guide team at [phone] if you need to cancel or reschedule your appointment.   If you are experiencing a Mental Health or [place] or need someone to talk to, please call the Suicide and Crisis Lifeline: 988 call the [place] National Suicide Prevention Lifeline: 575-017-5037 or TTY: (361) 221-3771 TTY 907-806-3005) to talk to a trained counselor call 1-800-273-TALK (toll free, 24 hour hotline) go to [facility] [address] [place] [phone]) call the [place]: [phone] call 911  Patient verbalizes understanding of instructions and care plan provided today and agrees to view in [place]. Active MyChart status and patient understanding of how to access instructions and care plan via MyChart confirmed with patient.     No further follow up required.  Nat Christen, BSW, MSW, LCSW  Licensed Education officer, environmental Health System  Mailing [place] [address] [place], [place] [address] Physical Address-[address], [place], [place] [address] Toll Free Main # [phone] Fax # [phone] Cell # [phone] [email or web address]

## 2022-09-05 NOTE — Patient Outreach (Signed)
Care Coordination   Follow Up Visit Note   09/05/2022  Name: Regina Richardson MRN: [id number] DOB: Oct 05, 1960  Regina Richardson is a 62 y.o. year old female who sees [name], MD for primary care. I spoke with Regina Richardson by phone today.  What matters to the patients health and wellness today?  Find Help in My Community.    Goals Addressed               This Visit's Progress     COMPLETED: Find Help in My Community. (pt-stated)   On track     Care Coordination Interventions:  Interventions Today    Flowsheet Row Most Recent Value  Chronic Disease   Chronic disease during today's visit Hypertension (HTN), Other  [Major Depressive Disorder, Recurrent Severe Without Psychotic Features, Insomnia, Generalized Anxiety Disorder, Alcohol Abuse, Tobacco Abuse & Chronic Pain]  General Interventions   General Interventions Discussed/Reviewed General Interventions Discussed, General Interventions Reviewed, Annual Eye Exam, Labs, Durable Medical Equipment (DME), Vaccines, Health Screening, [employer], Doctor Visits, Communication with  [facility] Care Provider & Pharmacist]  Labs Hgb A1c annually  Vaccines COVID-19, Flu, Pneumonia, RSV, Shingles  Doctor Visits Discussed/Reviewed Doctor Visits Discussed, Doctor Visits Reviewed, Annual Wellness Visits, PCP, Specialist  Health Screening Colonoscopy, Mammogram  Durable Medical Equipment (DME) BP Cuff, Walker  PCP/Specialist Visits Compliance with follow-up visit  Communication with PCP/Specialists, Pharmacists  Exercise Interventions   Exercise Discussed/Reviewed Exercise Discussed, Exercise Reviewed, Physical Activity, Weight Managment  Physical Activity Discussed/Reviewed Physical Activity Discussed, Physical Activity Reviewed, Types of exercise  Weight Management Weight maintenance  Education Interventions   Education Provided Provided Printed Education, Provided Web-based Education, Provided Education  Provided Verbal  Education On Nutrition, Mental Health/Coping with Illness, Applications, Exercise, Medication, When to see the doctor, [employer], [place] Discussed, Mental Health Reviewed, Coping Strategies, Crisis, Anxiety, Depression, Substance Abuse, Suicide  Nutrition Interventions   Nutrition Discussed/Reviewed Nutrition Discussed, Nutrition Reviewed, Fluid intake  Pharmacy Interventions   Pharmacy Dicussed/Reviewed Pharmacy Topics Discussed, Pharmacy Topics Reviewed, Medications and their functions, Medication Adherence, Affording Medications, Referral to Pharmacist  Medication Adherence Not taking medication, Unable to refill medication  Referral to Pharmacist Cannot afford medications  Safety Interventions   Safety Discussed/Reviewed Safety Discussed, Safety Reviewed, Fall Risk  Advanced Directive Interventions   Advanced Directives Discussed/Reviewed Advanced Directives Discussed, Advanced Directives Reviewed      Active Listening & Reflection Utilized.  Verbalization of Feelings Encouraged. Emotional Support Provided. Feelings of Frustration Validated. Caregiver Stress Acknowledged. Caregiver Resources Reviewed. Self-Enrollment in Caregiver Support Group of Interest Emphasized, from List Provided. Solution-Focused Strategies Enacted. Problem Solving Interventions Employed. Task-Centered Solutions Revised. Acceptance & Commitment Therapy Implemented. Cognitive Behavioral Therapy Performed. Client-Centered Therapy Initiated. Homicidal & Suicidal Ideations Assessed - None Present. Accessible Weapons in [place] - None Present. Domestic Violence Assessed - None Present.   Crisis Support, Services, Agencies & Resources Reviewed. Please Consider Attending an Alcoholics Anonymous Support Group of Interest, from List Provided. Please Consider Self-Enrollment in A Counseling Agency of Interest,  from List Provided. ~ Psychiatrist for Psychotropic Medication Management. ~ Transport planner for [employer]. Please Contact the Following List of Agencies, In An Effort to [place], [place]: ~ [place] ~ [place] ([employer]) Land  ~ Actuary ~ [employer] (RCATS) Metallurgist the  Following List of Agencies, In An Effort to [place]: ~ Boeing [phone]# [phone]) ~ [employer] [phone]) ~ Lot [phone] (# [phone]) ~ [place] [phone]# [phone]) ~ [place] [phone]) ~ [employer]ries [phone]# [phone]) ~ Citigroup [phone]) ~ [place] (# [phone]) ~ [place] (# [phone]) ~ [place] [phone]) ~ Aging, [place] of [place] [phone]) ~ Emergency Assistance Programs in [place] ~ [place] ~ Senior Resources of [place] ~ [place] Programs ~ [place] Application ~ [id number] Medicaid Tips ~ Apply for [employer] On-Line Instructions ~ Medicaid Application Please Complete Application for [place] to The [place] [phone]), for Processing. Please Complete Application for [place] to Aging, [place] [phone]), for Processing. Please Complete Application for Medicaid & Submit to The [place] [phone]), for Processing. Please Contact CSW Directly [id number]# [phone]), if You Have Questions, Need  Assistance with Application Completion & Submission, or If Additional Social Work Needs Are Identified in The Near Future.      SDOH assessments and interventions completed:  Yes.  Care Coordination Interventions:  Yes, provided.   Follow up plan: No further intervention required.   Encounter Outcome:  Pt. Visit Completed.   Nat Christen, BSW, MSW, LCSW  Licensed Education officer, environmental Health System  Mailing [place] [address] [place], [place] [address] Physical Address-[address] [place], [place] [address] Toll Free Main # [phone] Fax # [phone] Cell # [phone] [email or web address]

## 2022-09-05 NOTE — Progress Notes (Signed)
Patient notified of LDCT Lung Cancer Screening Results via mail with the recommendation to follow-up in 12 months. Patient's referring provider has been sent a copy of results. Results are as follows:  IMPRESSION: 1. Lung-RADS 1, negative. Continue annual screening with low-dose chest CT without contrast in 12 months. 2. Age advanced coronary artery atherosclerosis. Recommend assessment of coronary risk factors. 3. Cirrhosis. 4. Cholelithiasis. 5. Left adrenal nodule which has been present back to 04/26/2020, most consistent with a benign adenoma . In the absence of clinically indicated signs/symptoms require(s) no independent follow-up. 6. Aortic atherosclerosis (ICD10-I70.0) and emphysema (ICD10-J43.9).

## 2022-09-09 ENCOUNTER — Other Ambulatory Visit: Payer: Self-pay | Admitting: Family Medicine

## 2022-09-09 DIAGNOSIS — E790 Hyperuricemia without signs of inflammatory arthritis and tophaceous disease: Secondary | ICD-10-CM

## 2022-09-10 ENCOUNTER — Telehealth: Payer: Self-pay | Admitting: *Deleted

## 2022-09-10 NOTE — Progress Notes (Signed)
  Care Coordination  Note  09/10/2022 Name: Regina Richardson MRN: [id number] DOB: May 21, 1961  Regina Richardson is a 62 y.o. year old primary care patient of Stacks, Cletus Gash, MD. I reached out to Neil Crouch PA-C to assist  Regina Richardson with concern about medicaiton.  Regina Richardson. I have Elham on the phone she is stating that GI stopped her Protonix for labs and she is wanting to know if she can restart? She was about to go to ER for stomach pain when she realized the medication she stopped was for acid reflux. Thank you Regina Dauer, RN was added by you.  Mahala Menghini, PA-C Hey! Yes we had her stop it for two weeks before collecting H.pylori stool test and gastrin level. She should have restarted after that.    Okay I will call her back and make her aware Protonix 40 mg 2X daily   She is aware. Thank you   Patient aware and voiced understanding,     [place]  Direct Dial: [phone]

## 2022-09-10 NOTE — Progress Notes (Signed)
  Care Coordination Note  09/10/2022 Name: Regina Richardson MRN: [id number] DOB: 1961/01/16  Regina Richardson is a 62 y.o. year old female who is a primary care patient of Stacks, Cletus Gash, MD and is actively engaged with the care management team. I reached out to Regina Richardson by phone today to assist with re-scheduling a follow up visit with the RN Case Manager  Follow up plan: Telephone appointment with care management team member scheduled for:09/14/22 [place]  Direct Dial: [phone]

## 2022-09-12 ENCOUNTER — Ambulatory Visit
Admission: RE | Admit: 2022-09-12 | Discharge: 2022-09-12 | Disposition: A | Discharge status: Home / Self Care | Payer: Medicare HMO | Source: Ambulatory Visit | Attending: Family Medicine | Admitting: Family Medicine

## 2022-09-12 DIAGNOSIS — Z1231 Encounter for screening mammogram for malignant neoplasm of breast: Secondary | ICD-10-CM | POA: Diagnosis not present

## 2022-09-14 ENCOUNTER — Other Ambulatory Visit: Payer: Self-pay | Admitting: Gastroenterology

## 2022-09-14 ENCOUNTER — Ambulatory Visit: Payer: Self-pay | Admitting: *Deleted

## 2022-09-14 DIAGNOSIS — K269 Duodenal ulcer, unspecified as acute or chronic, without hemorrhage or perforation: Secondary | ICD-10-CM

## 2022-09-14 NOTE — Patient Outreach (Signed)
  Care Coordination   Follow Up Visit Note   09/14/2022 Name: Regina Richardson MRN: [id number] DOB: November 16, 1960  Regina Richardson is a 62 y.o. year old female who sees Regina Fraise, MD for primary care. I spoke with  Regina Richardson by phone today.  What matters to the patients health and wellness today?  Discussed being busy and frequent doctor visits Discussed attempts to fix a chair Response to her application for Medicaid She reports she was informed she made too much money-  She states she will attempt to re applied after discussion of medicaid extension of June 22 2022  Patient noted going from subject to subject.  After 2 unsuccessful outreaches but [facility] SW, patient interest in follow up with [facility] SW at next pcp visit 09/24/22 States she has not received a package  Still poor dentures not fitting well - Need "periodontal cleaning"   Goals Addressed               This Visit's Progress     Patient Stated     Behavioral health resources [facility]) (pt-stated)   Not on track     Care Coordination Interventions: Social Work referral for depression, alcohol cessation, counselor, psychiatrist found to be active on 07/20/22  Continues not to have connected to [facility] SW Interventions Today    Flowsheet Row Most Recent Value  Chronic Disease   Chronic disease during today's visit Other  [dental services. follow up with [facility] SW]  General Interventions   General Interventions Discussed/Reviewed Communication with, General Interventions Discussed, Community Resources  [reviewed [facility] SW visit for resources, check to see if items receive, offered to answer any questions]  Doctor Visits Discussed/Reviewed Doctor Visits Discussed, PCP, Specialist  [discused upcoming pcp visit, updated dental provider in care teams. Assesed concerns with her new dentures & use]  Durable Medical Equipment (DME) Other  [dentures]  PCP/Specialist Visits Compliance with follow-up visit  Communication with Social Work   [sent message to [facility] SW of patient interest in meeting at a doctor appointment]  Education Interventions   Provided Verbal Education On Other  [Discussed and encouraged her to re apply for medicaid related to the 06/22/22 [place] Discussed, Other  [discussed her referral to [facility] SW for counseling & resources,]            SDOH assessments and interventions completed:  Yes     Care Coordination Interventions:  Yes, provided   Follow up plan: Follow up call scheduled for 09/24/22    Encounter Outcome:  Pt. Visit Completed   Regina Richardson L. Regina Hamman, RN, BSN, [place] Coordinator Office number [phone]

## 2022-09-14 NOTE — Patient Instructions (Signed)
Visit Information  Thank you for taking time to visit with me today. Please don't hesitate to contact me if I can be of assistance to you.   Following are the goals we discussed today:   Goals Addressed               This Visit's Progress     Patient Stated     Behavioral health resources [facility]) (pt-stated)   Not on track     Care Coordination Interventions: Social Work referral for depression, alcohol cessation, counselor, psychiatrist found to be active on 07/20/22  Continues not to have connected to [facility] SW Interventions Today    Flowsheet Row Most Recent Value  Chronic Disease   Chronic disease during today's visit Other  [dental services. follow up with [facility] SW]  General Interventions   General Interventions Discussed/Reviewed Communication with, General Interventions Discussed, Community Resources  [reviewed [facility] SW visit for resources, check to see if items receive, offered to answer any questions]  Doctor Visits Discussed/Reviewed Doctor Visits Discussed, PCP, Specialist  [discused upcoming pcp visit, updated dental provider in care teams. Assesed concerns with her new dentures & use]  Durable Medical Equipment (DME) Other  [dentures]  PCP/Specialist Visits Compliance with follow-up visit  Communication with Social Work  [sent message to [facility] SW of patient interest in meeting at a doctor appointment]  Education Interventions   Provided Verbal Education On Other  [Discussed and encouraged her to re apply for medicaid related to the 06/22/22 [place] Discussed, Other  [discussed her referral to [facility] SW for counseling & resources,]            Our next appointment is by telephone on 09/24/22 at 3:30 pm  Please call the care guide team at [phone] if you need to cancel or reschedule your appointment.   If you are experiencing a Mental Health or [place] or need  someone to talk to, please call the Suicide and Crisis Lifeline: 988 call the [place] National Suicide Prevention Lifeline: 671-707-6538 or TTY: (346)466-7001 TTY 917-382-4563) to talk to a trained counselor call 1-800-273-TALK (toll free, 24 hour hotline) call the [facility]: (615)731-1231 call 911   The patient verbalized understanding of instructions, educational materials, and care plan provided today and DECLINED offer to receive copy of patient instructions, educational materials, and care plan.   The patient has been provided with contact information for the care management team and has been advised to call with any health related questions or concerns.   [name] L. Lavina Hamman, RN, BSN, [place] Coordinator Office number [phone]

## 2022-09-24 ENCOUNTER — Encounter: Payer: Self-pay | Admitting: Family Medicine

## 2022-09-24 ENCOUNTER — Ambulatory Visit (INDEPENDENT_AMBULATORY_CARE_PROVIDER_SITE_OTHER): Payer: Medicare HMO | Admitting: Family Medicine

## 2022-09-24 ENCOUNTER — Ambulatory Visit: Payer: Self-pay | Admitting: *Deleted

## 2022-09-24 VITALS — BP 152/71 | HR 61 | Temp 97.5°F | Ht 60.0 in | Wt 149.0 lb

## 2022-09-24 DIAGNOSIS — I700 Atherosclerosis of aorta: Secondary | ICD-10-CM

## 2022-09-24 DIAGNOSIS — F332 Major depressive disorder, recurrent severe without psychotic features: Secondary | ICD-10-CM | POA: Diagnosis not present

## 2022-09-24 DIAGNOSIS — I2510 Atherosclerotic heart disease of native coronary artery without angina pectoris: Secondary | ICD-10-CM | POA: Diagnosis not present

## 2022-09-24 DIAGNOSIS — C159 Malignant neoplasm of esophagus, unspecified: Secondary | ICD-10-CM

## 2022-09-24 DIAGNOSIS — E782 Mixed hyperlipidemia: Secondary | ICD-10-CM | POA: Diagnosis not present

## 2022-09-24 DIAGNOSIS — M5416 Radiculopathy, lumbar region: Secondary | ICD-10-CM | POA: Diagnosis not present

## 2022-09-24 DIAGNOSIS — G8929 Other chronic pain: Secondary | ICD-10-CM

## 2022-09-24 DIAGNOSIS — M545 Low back pain, unspecified: Secondary | ICD-10-CM | POA: Diagnosis not present

## 2022-09-24 DIAGNOSIS — F17200 Nicotine dependence, unspecified, uncomplicated: Secondary | ICD-10-CM | POA: Diagnosis not present

## 2022-09-24 DIAGNOSIS — K7030 Alcoholic cirrhosis of liver without ascites: Secondary | ICD-10-CM | POA: Diagnosis not present

## 2022-09-24 DIAGNOSIS — M4126 Other idiopathic scoliosis, lumbar region: Secondary | ICD-10-CM

## 2022-09-24 MED ORDER — UMECLIDINIUM-VILANTEROL 62.5-25 MCG/ACT IN AEPB: 1.0000 | INHALATION_SPRAY | RESPIRATORY_TRACT | 11 refills | Status: DC

## 2022-09-24 MED ORDER — PREDNISONE 10 MG PO TABS: ORAL_TABLET | ORAL | 0 refills | Status: DC

## 2022-09-24 MED ORDER — METHOCARBAMOL 500 MG PO TABS: 500.0000 mg | ORAL_TABLET | Freq: Three times a day (TID) | ORAL | 5 refills | Status: DC | PRN

## 2022-09-24 MED ORDER — ROSUVASTATIN CALCIUM 20 MG PO TABS: 20.0000 mg | ORAL_TABLET | Freq: Every day | ORAL | 3 refills | Status: DC

## 2022-09-24 NOTE — Progress Notes (Signed)
Subjective:  Patient ID: Regina Richardson, female    DOB: [birth year]  Age: 62 y.o. MRN: [id number]  CC: Follow-up   HPI Regina Richardson presents for follow up of depression. Med has been tolerated, but not helpful.PHQ &GAD noted below.   Back pain radiating to the right leg. Tried methocarbamol this AM. Onset  1 week ago. Getting worse. Has seen ortho. Therapy ordered. Didn't help. Had epidurals. They helped for a little while. Hx of scoliosis. Surgery offered, but pt. Perceptioon was that provider felt it would at best be a temporary fix. She declined. Sx seem to be getting more frequent, and worse.   Cirrhosis - quit alcohol 8 mos ago.   Patient had a CT scan for lung cancer screening recently.  Multiple noncancer related findings were noted.  She was sent a copy of the report without any explanations she is very confused would like to review that today as the main topic of her visit.  Foremost was that she was noted to have emphysema.  She states that she does have shortness of breath with walking over about a block on level ground.  This suggests an mMRC score of grade 2.  She was told she had aortic atherosclerosis as well as left anterior descending coronary atherosclerosis.  She is taking Crestor 5 mg a day.  That has only been recently added.  She continues to smoke.  She was noted to have cirrhosis of the liver and dependent gallstones.  These were explained to her as well.  Of note is that she needs to make sure she does not go back to alcohol due to the cirrhosis.  She is working with GI on cirrhosis as well as multiple other issues including GERD, severe duodenitis with multiple cratered duodenal ulcers being noted on recent EGD.  She has a history of esophageal carcinoma..    09/24/2022   10:24 AM 08/13/2022   10:11 AM 07/08/2022    8:52 PM  Depression screen PHQ 2/9  Decreased Interest '3 3 1  '$ Down, Depressed, Hopeless '3 3 1  '$ PHQ - 2 Score '6 6 2  '$ Altered sleeping '3 3 1  '$ Tired,  decreased energy '3 3 1  '$ Change in appetite '3 3 1  '$ Feeling bad or failure about yourself  '3 3 1  '$ Trouble concentrating '3 3 1  '$ Moving slowly or fidgety/restless 3 1 0  Suicidal thoughts '1 1 1  '$ PHQ-9 Score '25 23 8  '$ Difficult doing work/chores Very difficult Very difficult Somewhat difficult      09/24/2022   10:25 AM 08/13/2022   10:12 AM 04/10/2022   10:08 AM 01/04/2022    2:25 PM  GAD 7 : Generalized Anxiety Score  Nervous, Anxious, on Edge '3 3 3 3  '$ Control/stop worrying '3 3 3 3  '$ Worry too much - different things '3 3 3 3  '$ Trouble relaxing '3 3 3 3  '$ Restless '3 3 1 1  '$ Easily annoyed or irritable '3 3 3 3  '$ Afraid - awful might happen '3 3 3 3  '$ Total GAD 7 Score '21 21 19 19  '$ Anxiety Difficulty Very difficult Very difficult Somewhat difficult Extremely difficult     History Regina Richardson has a past medical history of Alcoholism ([place]), Anxiety, Apnea (06/11/2021), Arrhythmia, Arthritis, Cancer ([place]), Cirrhosis ([place]) (04/2020), Depression, Dysrhythmia, GERD (gastroesophageal reflux disease), Hypertension, Osteoporosis, Scoliosis, and Urinary incontinence.   She has a past surgical history that includes Total abdominal hysterectomy; left collar bone surgery; left  forearm surgery; Colonoscopy (08/2017); Cesarean section; Colposcopy; Cervical biopsy w/ loop electrode excision; Hemorrhoid surgery; Esophagogastroduodenoscopy (08/2017); Finger surgery; Esophagogastroduodenoscopy (N/A, 06/15/2020); Esophageal dilation (N/A, 06/15/2020); Colonoscopy (N/A, 06/15/2020); Esophagogastroduodenoscopy (egd) with propofol (N/A, 10/11/2020); biopsy (10/11/2020); Upper esophageal endoscopic ultrasound (eus) (N/A, 12/12/2020); biopsy (12/12/2020); Esophagogastroduodenoscopy (egd) with propofol (N/A, 12/12/2020); EGD with mucosal resection (03/16/2021); Esophagogastroduodenoscopy (egd) with propofol (N/A, 04/27/2022); and biopsy (04/27/2022).   Her family history includes Alcohol abuse in her father and mother; Cirrhosis in her  brother; Diabetes in her cousin, maternal aunt, and sister; Heart disease in her father; Kidney disease in her maternal aunt; Liver disease in her brother, mother, and sister; Lung cancer in her father.She reports that she has been smoking cigarettes. She has a 20.00 pack-year smoking history. She has been exposed to tobacco smoke. She has never used smokeless tobacco. She reports that she does not currently use alcohol after a past usage of about 12.0 standard drinks of alcohol per week. She reports that she does not use drugs.    ROS Review of Systems  Constitutional: Negative.  Negative for diaphoresis, fatigue and fever.  HENT: Negative.    Eyes:  Negative for visual disturbance.  Respiratory:  Positive for cough and shortness of breath.   Cardiovascular:  Negative for chest pain.  Gastrointestinal:  Negative for abdominal pain.  Musculoskeletal:  Positive for arthralgias and back pain.  Psychiatric/Behavioral:  Positive for decreased concentration and dysphoric mood. The patient is nervous/anxious.     Objective:  BP (!) 152/71   Pulse 61   Temp (!) 97.5 F (36.4 C)   Ht 5' (1.524 m)   Wt 149 lb (67.6 kg)   SpO2 100%   BMI 29.10 kg/m   BP Readings from Last 3 Encounters:  09/24/22 (!) 152/71  08/13/22 (!) 168/79  05/22/22 (!) 153/89    Wt Readings from Last 3 Encounters:  09/24/22 149 lb (67.6 kg)  08/13/22 157 lb 3.2 oz (71.3 kg)  05/22/22 155 lb (70.3 kg)     Physical Exam Constitutional:      General: She is not in acute distress.    Appearance: She is well-developed.  Cardiovascular:     Rate and Rhythm: Normal rate and regular rhythm.  Pulmonary:     Breath sounds: Normal breath sounds.  Abdominal:     Palpations: Abdomen is soft.     Tenderness: There is no abdominal tenderness.  Musculoskeletal:        General: Normal range of motion.  Skin:    General: Skin is warm and dry.  Neurological:     Mental Status: She is alert and oriented to person,  place, and time.       Assessment & Plan:   Regina Richardson was seen today for follow-up.  Diagnoses and all orders for this visit:  Alcoholic cirrhosis of liver without ascites ([place])  Major depressive disorder, recurrent severe without psychotic features ([place]) -     Ambulatory referral to Psychiatry  Lumbar radiculopathy  Other idiopathic scoliosis, lumbar region -     methocarbamol (ROBAXIN) 500 MG tablet; Take 1 tablet (500 mg total) by mouth every 8 (eight) hours as needed for muscle spasms.  Aortic atherosclerosis (HCC)  Chronic midline low back pain without sciatica -     methocarbamol (ROBAXIN) 500 MG tablet; Take 1 tablet (500 mg total) by mouth every 8 (eight) hours as needed for muscle spasms.  Mixed hyperlipidemia -     rosuvastatin (CRESTOR) 20 MG tablet; Take 1 tablet (  20 mg total) by mouth daily.  Atherosclerosis of native coronary artery of native heart without angina pectoris  Smoker  Squamous cell carcinoma of esophagus/Moderate to severe squamous dysplasia.- March 2022  Other orders -     predniSONE (DELTASONE) 10 MG tablet; Take 5 daily for 3 days followed by 4,3,2 and 1 for 3 days each. -     umeclidinium-vilanterol (ANORO ELLIPTA) 62.5-25 MCG/ACT AEPB; Inhale 1 puff into the lungs every morning.       I have changed Regina Richardson's rosuvastatin. I am also having her start on predniSONE and umeclidinium-vilanterol. Additionally, I am having her maintain her ondansetron, pantoprazole, folic acid, buPROPion, metoprolol succinate, diclofenac, Vilazodone HCl, QUEtiapine, allopurinol, sucralfate, and methocarbamol.  Allergies as of 09/24/2022   No Known Allergies      Medication List        Accurate as of September 24, 2022 11:19 AM. If you have any questions, ask your nurse or doctor.          allopurinol 300 MG tablet Commonly known as: ZYLOPRIM TAKE 1 TABLET BY MOUTH EVERY DAY   buPROPion 150 MG 12 hr tablet Commonly known as: WELLBUTRIN SR Take 1  tablet (150 mg total) by mouth 2 (two) times daily.   diclofenac 75 MG EC tablet Commonly known as: VOLTAREN TAKE 1 TABLET BY MOUTH TWICE A DAY AS NEEDED   folic acid 1 MG tablet Commonly known as: FOLVITE Take 1 tablet (1 mg total) by mouth daily.   methocarbamol 500 MG tablet Commonly known as: Robaxin Take 1 tablet (500 mg total) by mouth every 8 (eight) hours as needed for muscle spasms.   metoprolol succinate 100 MG 24 hr tablet Commonly known as: TOPROL-XL Take 1 tablet (100 mg total) by mouth daily.   ondansetron 4 MG disintegrating tablet Commonly known as: ZOFRAN-ODT Take 1 tablet (4 mg total) by mouth every 8 (eight) hours as needed for nausea or vomiting.   pantoprazole 40 MG tablet Commonly known as: PROTONIX Take 1 tablet (40 mg total) by mouth 2 (two) times daily.   predniSONE 10 MG tablet Commonly known as: DELTASONE Take 5 daily for 3 days followed by 4,3,2 and 1 for 3 days each. Started by: [name], MD   QUEtiapine 50 MG tablet Commonly known as: SEROQUEL TAKE 1 TABLET BY MOUTH EVERYDAY AT BEDTIME   rosuvastatin 20 MG tablet Commonly known as: CRESTOR Take 1 tablet (20 mg total) by mouth daily. What changed:  medication strength how much to take Changed by: [name], MD   sucralfate 1 g tablet Commonly known as: CARAFATE TAKE 1 TABLET (1 G TOTAL) BY MOUTH 4 TIMES A DAY WITH MEALS AND AT BEDTIME   umeclidinium-vilanterol 62.5-25 MCG/ACT Aepb Commonly known as: ANORO ELLIPTA Inhale 1 puff into the lungs every morning. Started by: [name], MD   Vilazodone HCl 20 MG Tabs TAKE 1 TABLET BY MOUTH EVERY DAY       Abstinence from alcohol congratulated, reinforced. Smoking cessation recommended. Multiple med changes as note above. Coronary risk based on CT for atherosclerosis reviewed.   Follow-up: Return in about 1 month (around 10/25/2022).  [name], M.D.

## 2022-09-26 ENCOUNTER — Encounter: Payer: Self-pay | Admitting: *Deleted

## 2022-09-26 ENCOUNTER — Ambulatory Visit: Payer: Self-pay | Admitting: *Deleted

## 2022-09-26 NOTE — Patient Instructions (Signed)
Visit Information  Thank you for taking time to visit with me today. Please don't hesitate to contact me if I can be of assistance to you.   Following are the goals we discussed today:   Goals Addressed               This Visit's Progress     Find Help in My Community. (pt-stated)   On track     Care Coordination Interventions:  Interventions Today    Flowsheet Row Most Recent Value  Chronic Disease   Chronic disease during today's visit Hypertension (HTN), Other  [Major Depressive Disorder, Recurrent Severe Without Psychotic Features, Insomnia, Generalized Anxiety Disorder, Alcohol Abuse, Tobacco Abuse & Chronic Pain]  General Interventions   General Interventions Discussed/Reviewed General Interventions Discussed, General Interventions Reviewed, Annual Eye Exam, Labs, Durable Medical Equipment (DME), Vaccines, Health Screening, [employer], Doctor Visits, Communication with  [facility] Care Provider & Pharmacist]  Labs Hgb A1c annually  Vaccines COVID-19, Flu, Pneumonia, RSV, Shingles  Doctor Visits Discussed/Reviewed Doctor Visits Discussed, Doctor Visits Reviewed, Annual Wellness Visits, PCP, Specialist  Health Screening Colonoscopy, Mammogram  Durable Medical Equipment (DME) BP Cuff, Walker  PCP/Specialist Visits Compliance with follow-up visit  Communication with PCP/Specialists, Pharmacists  Exercise Interventions   Exercise Discussed/Reviewed Exercise Discussed, Exercise Reviewed, Physical Activity, Weight Managment  Physical Activity Discussed/Reviewed Physical Activity Discussed, Physical Activity Reviewed, Types of exercise  Weight Management Weight maintenance  Education Interventions   Education Provided Provided Printed Education, Provided Web-based Education, Provided Education  Provided Verbal Education On Nutrition, Mental Health/Coping with Illness, Applications, Exercise, Medication, When to see the doctor, [employer], [place] Discussed, Mental Health Reviewed, Coping Strategies, Crisis, Anxiety, Depression, Substance Abuse, Suicide  Nutrition Interventions   Nutrition Discussed/Reviewed Nutrition Discussed, Nutrition Reviewed, Fluid intake  Pharmacy Interventions   Pharmacy Dicussed/Reviewed Pharmacy Topics Discussed, Pharmacy Topics Reviewed, Medications and their functions, Medication Adherence, Affording Medications, Referral to Pharmacist  Medication Adherence Not taking medication, Unable to refill medication  Referral to Pharmacist Cannot afford medications  Safety Interventions   Safety Discussed/Reviewed Safety Discussed, Safety Reviewed, Fall Risk  Advanced Directive Interventions   Advanced Directives Discussed/Reviewed Advanced Directives Discussed, Advanced Directives Reviewed      Active Listening & Reflection Utilized.  Verbalization of Feelings Encouraged. Emotional Support Provided. Feelings of Anger & Frustration Validated. Caregiver Stress Acknowledged. Caregiver Resources Reviewed. Self-Enrollment in Caregiver Support Group of Interest Emphasized. Solution-Focused Strategies Enacted. Problem Solving Interventions Employed. Task-Centered Solutions Revised. Acceptance & Commitment Therapy Implemented. Cognitive Behavioral Therapy Performed. Client-Centered Therapy Initiated. Homicidal & Suicidal Ideations Assessed - None Present. Access to Weapons Assessed - None Present. Domestic Violence Assessed - None Present.   Crisis Support, Services, Agencies & Resources Revisited. Please Consider Attending an Alcoholics Anonymous Support Group of Interest, from List Provided. Please Consider Self-Enrollment in Psychiatric Services with Psychiatrist of Interest, for Psychotropic Medication Management, from List Provided. Please Consider Self-Enrollment in Psychiatric Services with Therapist of Interest, for Psychotherapeutic  Counseling Services, from List Provided. Upon Receipt of The Following List of [facility], Mailed a Second Time on 09/26/2022, Please Begin Contacting Agencies & Resources of Interest, In An Effort to [place], [place]: ~ [place] ~ [place] ([employer]) Land  ~ [place] ~ [employer] [employer] ([place]) Land  ~ [employer] [phone]# [phone]) ~ [place]  Way [phone]# [phone]) ~ Lot [phone]) ~ [place] [phone]# [phone]) ~ [place] [phone]) ~ [employer] [phone]# [phone]) ~ [employer] [phone]) ~ [place] (# [phone]) ~ [place] (# [phone]) ~ [place] [phone]) ~ Aging, [place] of [place] [phone]) ~ Emergency Assistance Programs in [place] ~ [place] ~ [employer] of [place] ~ [place] Programs ~ [place] Application ~ [id number] Medicaid Tips ~ Apply for [employer] Corporation ~ Medicaid Application Confirmed Ability to Independently Complete Application for [employer] Nutrition Assistance Program (SNAP) & Submit Application to [place] [phone]), for Processing. Confirmed Ability to Independently Complete Application for [facility] Big Lots & Submit Application to Aging, [place] [phone]), for Processing. Confirmed Ability to Complete Application for [place] Application to [place] Department [phone]), for  Processing. Confirmed Ability to Complete Application for Medicaid & Submit Application to The [place] [phone]), for Processing. Please Contact CSW Directly [phone]# [phone]), if You Have Questions, Need Assistance with Application Completion & Submission, or If Additional Social Work Needs Are Identified Between Now & Our Next Scheduled Telephone [employer].      Our next appointment is by telephone on 10/12/2022 at 10:15 am.  Please call the care guide team at [phone] if you need to cancel or reschedule your appointment.   If you are experiencing a Mental Health or [place] or need someone to talk to, please call the Suicide and Crisis Lifeline: 988 call the [place] National Suicide Prevention Lifeline: 562-788-1614 or TTY: (763)558-9293 TTY 209-588-4207) to talk to a trained counselor call 1-800-273-TALK (toll free, 24 hour hotline) go to [facility] [address] [place] [phone]) call the [place]: [phone] call 911  Patient verbalizes understanding of instructions and care plan provided today and agrees to view in [place]. Active MyChart status and patient understanding of how to access instructions and care plan via MyChart confirmed with patient.     Telephone follow up appointment with care management team member scheduled for:  10/12/2022 at 10:15 am.  Nat Christen, BSW, MSW, [place]  Licensed Clinical Social Worker  [place]  Mailing [place]. [address], [place], [place] [address] Physical Address-[address] [place], [place] [address] Toll Free Main # [phone] Fax # [phone] Cell # [phone] Di [email or web address]

## 2022-09-26 NOTE — Patient Outreach (Signed)
Care Coordination   Follow Up Visit Note   09/26/2022  Name: Regina Richardson MRN: [id number] DOB: [birth year]  Regina Richardson is a 62 y.o. year old female who sees [name], MD for primary care. I spoke with Regina Richardson by phone today.  What matters to the patients health and wellness today?   Find Help in My Community.   Goals Addressed               This Visit's Progress     Find Help in My Community. (pt-stated)   On track     Care Coordination Interventions:  Interventions Today    Flowsheet Row Most Recent Value  Chronic Disease   Chronic disease during today's visit Hypertension (HTN), Other  [Major Depressive Disorder, Recurrent Severe Without Psychotic Features, Insomnia, Generalized Anxiety Disorder, Alcohol Abuse, Tobacco Abuse & Chronic Pain]  General Interventions   General Interventions Discussed/Reviewed General Interventions Discussed, General Interventions Reviewed, Annual Eye Exam, Labs, Durable Medical Equipment (DME), Vaccines, Health Screening, [employer], Doctor Visits, Communication with  [facility] Care Provider & Pharmacist]  Labs Hgb A1c annually  Vaccines COVID-19, Flu, Pneumonia, RSV, Shingles  Doctor Visits Discussed/Reviewed Doctor Visits Discussed, Doctor Visits Reviewed, Annual Wellness Visits, PCP, Specialist  Health Screening Colonoscopy, Mammogram  Durable Medical Equipment (DME) BP Cuff, Walker  PCP/Specialist Visits Compliance with follow-up visit  Communication with PCP/Specialists, Pharmacists  Exercise Interventions   Exercise Discussed/Reviewed Exercise Discussed, Exercise Reviewed, Physical Activity, Weight Managment  Physical Activity Discussed/Reviewed Physical Activity Discussed, Physical Activity Reviewed, Types of exercise  Weight Management Weight maintenance  Education Interventions   Education Provided Provided Printed Education, Provided Web-based Education, Provided Education  Provided Verbal Education On  Nutrition, Mental Health/Coping with Illness, Applications, Exercise, Medication, When to see the doctor, [employer], [place] Discussed, Mental Health Reviewed, Coping Strategies, Crisis, Anxiety, Depression, Substance Abuse, Suicide  Nutrition Interventions   Nutrition Discussed/Reviewed Nutrition Discussed, Nutrition Reviewed, Fluid intake  Pharmacy Interventions   Pharmacy Dicussed/Reviewed Pharmacy Topics Discussed, Pharmacy Topics Reviewed, Medications and their functions, Medication Adherence, Affording Medications, Referral to Pharmacist  Medication Adherence Not taking medication, Unable to refill medication  Referral to Pharmacist Cannot afford medications  Safety Interventions   Safety Discussed/Reviewed Safety Discussed, Safety Reviewed, Fall Risk  Advanced Directive Interventions   Advanced Directives Discussed/Reviewed Advanced Directives Discussed, Advanced Directives Reviewed      Active Listening & Reflection Utilized.  Verbalization of Feelings Encouraged. Emotional Support Provided. Feelings of Anger & Frustration Validated. Caregiver Stress Acknowledged. Caregiver Resources Reviewed. Self-Enrollment in Caregiver Support Group of Interest Emphasized. Solution-Focused Strategies Enacted. Problem Solving Interventions Employed. Task-Centered Solutions Revised. Acceptance & Commitment Therapy Implemented. Cognitive Behavioral Therapy Performed. Client-Centered Therapy Initiated. Homicidal & Suicidal Ideations Assessed - None Present. Access to Weapons Assessed - None Present. Domestic Violence Assessed - None Present.   Crisis Support, Services, Agencies & Resources Revisited. Please Consider Attending an Alcoholics Anonymous Support Group of Interest, from List Provided. Please Consider Self-Enrollment in Psychiatric Services with Psychiatrist of Interest, for Psychotropic  Medication Management, from List Provided. Please Consider Self-Enrollment in Psychiatric Services with Therapist of Interest, for Psychotherapeutic Counseling Services, from List Provided. Upon Receipt of The Following List of [place] a Second Time on 09/26/2022, Please Begin Contacting Agencies & Resources of Interest, In An Effort to [place], [place]: ~ [place] [employer] ~ [employer] Nutrition Assistance Program (SNAP) Application  ~ Transportation Options ~ [place] ([place]) Land  ~ Boeing [phone]# [phone]) ~ [employer] [phone]) ~ [place] 813 759 2843 (# [phone]) ~ [place] [phone]# [phone]) ~ [place] [phone]) ~ [employer] [phone]# [phone]) ~ Citigroup [phone]) ~ [place] (# [phone]) ~ [place] (# [phone]) ~ [place] [phone]) ~ Aging, [place] of [place] [phone]) ~ Emergency Assistance Programs in [place] ~ [place] ~ [employer] of [place] ~ [place] Programs ~ [place] Application ~ [id number] Medicaid Tips ~ Apply for [employer] Instructions ~ Medicaid Application Confirmed Ability to Independently Complete Application for [employer] Nutrition Assistance Program (SNAP) & Submit Application to The [place] [phone]), for Processing. Confirmed Ability to Independently Complete Application for [facility] Big Lots & Submit Application to Aging, [place] [phone]), for Processing. Confirmed Ability to  Complete Application for [place] Application to [place] Department [phone]), for Processing. Confirmed Ability to Complete Application for Medicaid & Submit Application to The [place] [phone]), for Processing. Please Contact CSW Directly [phone]# [phone]), if You Have Questions, Need Assistance with Application Completion & Submission, or If Additional Social Work Needs Are Identified Between Now & Our Next Scheduled Telephone [employer].      SDOH assessments and interventions completed:  Yes.  Care Coordination Interventions:  Yes, provided.   Follow up plan: Follow up call scheduled for 10/12/2022 at 10:15 am.  Encounter Outcome:  Pt. Visit Completed.   Nat Christen, BSW, MSW, LCSW  Licensed Education officer, [facility]  Mailing [place] [address] [place], [place] [address] Physical Address-[address] [place], [place] [address] Toll Free Main # [phone] Fax # [phone] Cell # [phone] Di [email or web address]

## 2022-09-28 ENCOUNTER — Other Ambulatory Visit: Payer: Medicare HMO | Admitting: *Deleted

## 2022-09-28 DIAGNOSIS — H5213 Myopia, bilateral: Secondary | ICD-10-CM | POA: Diagnosis not present

## 2022-09-28 DIAGNOSIS — H524 Presbyopia: Secondary | ICD-10-CM | POA: Diagnosis not present

## 2022-10-08 ENCOUNTER — Other Ambulatory Visit: Payer: Self-pay | Admitting: Gastroenterology

## 2022-10-08 DIAGNOSIS — K269 Duodenal ulcer, unspecified as acute or chronic, without hemorrhage or perforation: Secondary | ICD-10-CM

## 2022-10-12 ENCOUNTER — Encounter: Payer: Self-pay | Admitting: *Deleted

## 2022-10-12 ENCOUNTER — Ambulatory Visit: Payer: Self-pay | Admitting: *Deleted

## 2022-10-12 NOTE — Patient Outreach (Signed)
Care Coordination   Follow Up Visit Note   10/12/2022  Name: Regina Richardson MRN: [id number] DOB: 1960-10-09  Regina Richardson is a 62 y.o. year old female who sees Regina Fraise, MD for primary care. I spoke with Regina Richardson by phone today.  What matters to the patients health and wellness today?  Find Help in My Community.   Goals Addressed               This Visit's Progress     COMPLETED: Find Help in My Community. (pt-stated)   On track     Care Coordination Interventions:  Interventions Today    Flowsheet Row Most Recent Value  Chronic Disease   Chronic disease during today's visit Hypertension (HTN), Other  [Major Depressive Disorder, Recurrent Severe Without Psychotic Features, Insomnia, Generalized Anxiety Disorder, Alcohol Abuse, Tobacco Abuse & Chronic Pain]  General Interventions   General Interventions Discussed/Reviewed General Interventions Discussed, General Interventions Reviewed, Annual Eye Exam, Labs, Durable Medical Equipment (DME), Vaccines, Health Screening, [employer], Doctor Visits, Communication with  [facility] Care Provider & Pharmacist]  Labs Hgb A1c annually  Vaccines COVID-19, Flu, Pneumonia, RSV, Shingles  Doctor Visits Discussed/Reviewed Doctor Visits Discussed, Doctor Visits Reviewed, Annual Wellness Visits, PCP, Specialist  Health Screening Colonoscopy, Mammogram  Durable Medical Equipment (DME) BP Cuff, Walker  PCP/Specialist Visits Compliance with follow-up visit  Communication with PCP/Specialists, Pharmacists  Exercise Interventions   Exercise Discussed/Reviewed Exercise Discussed, Exercise Reviewed, Physical Activity, Weight Managment  Physical Activity Discussed/Reviewed Physical Activity Discussed, Physical Activity Reviewed, Types of exercise  Weight Management Weight maintenance  Education Interventions   Education Provided Provided Printed Education, Provided Web-based Education, Provided Education  Provided Verbal Education  On Nutrition, Mental Health/Coping with Illness, Applications, Exercise, Medication, When to see the doctor, [employer], [place] Discussed, Mental Health Reviewed, Coping Strategies, Crisis, Anxiety, Depression, Substance Abuse, Suicide  Nutrition Interventions   Nutrition Discussed/Reviewed Nutrition Discussed, Nutrition Reviewed, Fluid intake  Pharmacy Interventions   Pharmacy Dicussed/Reviewed Pharmacy Topics Discussed, Pharmacy Topics Reviewed, Medications and their functions, Medication Adherence, Affording Medications, Referral to Pharmacist  Medication Adherence Not taking medication, Unable to refill medication  Referral to Pharmacist Cannot afford medications  Safety Interventions   Safety Discussed/Reviewed Safety Discussed, Safety Reviewed, Fall Risk  Advanced Directive Interventions   Advanced Directives Discussed/Reviewed Advanced Directives Discussed, Advanced Directives Reviewed      Active Listening & Reflection Utilized.  Verbalization of Feelings Encouraged. Emotional Support Provided. Feelings of Anger & Frustration Validated. Self-Enrollment in Support Group of Interest Emphasized. Solution-Focused Strategies Activated. Problem Solving Interventions Employed. Task-Centered Solutions Indicated. Acceptance & Commitment Therapy Implemented. Cognitive Behavioral Therapy Performed. Client-Centered Therapy Initiated. Homicidal & Suicidal Ideations Assessed - None Present. Access to Weapons Assessed - None Present. Domestic Violence Assessed - None Present.   Crisis Support, Services, Agencies & Resources Revisited. Please Consider Attending an Alcoholics Anonymous Support Group of Interest. Please Consider Self-Enrollment in Psychiatric Services with Psychiatrist of Interest, for Psychotropic Medication Management. Please Consider Self-Enrollment in Psychiatric Services with  Therapist of Interest, for Psychotherapeutic Counseling Services. Confirmed Ability to Independently Complete Application for Supplemental Nutrition Assistance Program [facility]) & Submit Application to The [place] [phone]), for Processing. Confirmed Ability to Independently Complete Application for [facility] [employer] & Submit Application to Aging, [place] [phone]), for Processing. Confirmed Ability to Complete Application  for [place] Application to [facility] Counseling Department [phone]), for Processing. Confirmed Ability to Complete Application for Medicaid & Submit Application to The [place] [phone]), for Processing. Please Contact CSW Directly [phone]), if You Have Questions, Need Assistance with Application Completion & Submission, Change Your Mind About Wanting to Receive CSW Services, or If Additional Social Work Needs Are Identified in The Near Future.      SDOH assessments and interventions completed:  Yes.  Care Coordination Interventions:  Yes, provided.   Follow up plan: No further intervention required.   Encounter Outcome:  Pt. Visit Completed.   Nat Christen, BSW, MSW, LCSW  Licensed Education officer, [facility]  Mailing [place] [address] [place], [place] [address] Physical Address-[address] [place], [place] [address] Toll Free Main # [phone] Fax # [phone] Cell # [phone] [email or web address]

## 2022-10-12 NOTE — Patient Instructions (Signed)
Visit Information  Thank you for taking time to visit with me today. Please don't hesitate to contact me if I can be of assistance to you.   Following are the goals we discussed today:   Goals Addressed               This Visit's Progress     COMPLETED: Find Help in My Community. (pt-stated)   On track     Care Coordination Interventions:  Interventions Today    Flowsheet Row Most Recent Value  Chronic Disease   Chronic disease during today's visit Hypertension (HTN), Other  [Major Depressive Disorder, Recurrent Severe Without Psychotic Features, Insomnia, Generalized Anxiety Disorder, Alcohol Abuse, Tobacco Abuse & Chronic Pain]  General Interventions   General Interventions Discussed/Reviewed General Interventions Discussed, General Interventions Reviewed, Annual Eye Exam, Labs, Durable Medical Equipment (DME), Vaccines, Health Screening, [employer], Doctor Visits, Communication with  [facility] Care Provider & Pharmacist]  Labs Hgb A1c annually  Vaccines COVID-19, Flu, Pneumonia, RSV, Shingles  Doctor Visits Discussed/Reviewed Doctor Visits Discussed, Doctor Visits Reviewed, Annual Wellness Visits, PCP, Specialist  Health Screening Colonoscopy, Mammogram  Durable Medical Equipment (DME) BP Cuff, Walker  PCP/Specialist Visits Compliance with follow-up visit  Communication with PCP/Specialists, Pharmacists  Exercise Interventions   Exercise Discussed/Reviewed Exercise Discussed, Exercise Reviewed, Physical Activity, Weight Managment  Physical Activity Discussed/Reviewed Physical Activity Discussed, Physical Activity Reviewed, Types of exercise  Weight Management Weight maintenance  Education Interventions   Education Provided Provided Printed Education, Provided Web-based Education, Provided Education  Provided Verbal Education On Nutrition, Mental Health/Coping with Illness, Applications, Exercise, Medication, When to see the doctor, [employer], [place] Discussed, Mental Health Reviewed, Coping Strategies, Crisis, Anxiety, Depression, Substance Abuse, Suicide  Nutrition Interventions   Nutrition Discussed/Reviewed Nutrition Discussed, Nutrition Reviewed, Fluid intake  Pharmacy Interventions   Pharmacy Dicussed/Reviewed Pharmacy Topics Discussed, Pharmacy Topics Reviewed, Medications and their functions, Medication Adherence, Affording Medications, Referral to Pharmacist  Medication Adherence Not taking medication, Unable to refill medication  Referral to Pharmacist Cannot afford medications  Safety Interventions   Safety Discussed/Reviewed Safety Discussed, Safety Reviewed, Fall Risk  Advanced Directive Interventions   Advanced Directives Discussed/Reviewed Advanced Directives Discussed, Advanced Directives Reviewed      Active Listening & Reflection Utilized.  Verbalization of Feelings Encouraged. Emotional Support Provided. Feelings of Anger & Frustration Validated. Self-Enrollment in Support Group of Interest Emphasized. Solution-Focused Strategies Activated. Problem Solving Interventions Employed. Task-Centered Solutions Indicated. Acceptance & Commitment Therapy Implemented. Cognitive Behavioral Therapy Performed. Client-Centered Therapy Initiated. Homicidal & Suicidal Ideations Assessed - None Present. Access to Weapons Assessed - None Present. Domestic Violence Assessed - None Present.   Crisis Support, Services, Agencies & Resources Revisited. Please Consider Attending an Alcoholics Anonymous Support Group of Interest. Please Consider Self-Enrollment in Psychiatric Services with Psychiatrist of Interest, for Psychotropic Medication Management. Please Consider Self-Enrollment in Psychiatric Services with Therapist of Interest, for Psychotherapeutic Counseling Services. Confirmed Ability to Independently Complete Application for Supplemental  Nutrition Assistance Program [facility]) & Submit Application to The [place] [phone]), for Processing. Confirmed Ability to Independently Complete Application for [facility] [employer] & Submit Application to Aging, [place] [phone]), for Processing. Confirmed Ability to Complete Application for [place] Application to [place] Department [phone]), for Processing. Confirmed Ability to Complete Application for Medicaid & Submit Application  to The [place] [phone]), for Processing. Please Contact CSW Directly [phone]# [phone]), if You Have Questions, Need Assistance with Application Completion & Submission, Change Your Mind About Wanting to Receive CSW Services, or If Additional Social Work Needs Are Identified in The Near Future.      Please call the care guide team at [phone] if you need to cancel or reschedule your appointment.   If you are experiencing a Mental Health or [place] or need someone to talk to, please call the Suicide and Crisis Lifeline: 988 call the [place] National Suicide Prevention Lifeline: (334)262-8594 or TTY: 360 557 3071 TTY (314)701-2176) to talk to a trained counselor call 1-800-273-TALK (toll free, 24 hour hotline) go to [facility] [address], [place] [phone]) call the [place]: [phone] call 911  Patient verbalizes understanding of instructions and care plan provided today and agrees to view in [place]. Active MyChart status and patient understanding of how to access instructions and care plan via MyChart confirmed with patient.     No further follow up required.    Nat Christen, BSW, MSW, LCSW  Licensed [facility]  Mailing [place] [address], [place], [place] [address] Physical Address-[address] [place], [place] [address] Toll Free Main # [phone] Fax # [phone] Cell # [phone] [email or web address]

## 2022-10-17 ENCOUNTER — Ambulatory Visit (INDEPENDENT_AMBULATORY_CARE_PROVIDER_SITE_OTHER): Payer: Medicare HMO | Admitting: Internal Medicine

## 2022-10-17 ENCOUNTER — Encounter: Payer: Self-pay | Admitting: Internal Medicine

## 2022-10-17 VITALS — BP 128/73 | HR 81 | Temp 97.8°F | Ht 60.0 in | Wt 160.8 lb

## 2022-10-17 DIAGNOSIS — K269 Duodenal ulcer, unspecified as acute or chronic, without hemorrhage or perforation: Secondary | ICD-10-CM | POA: Diagnosis not present

## 2022-10-17 DIAGNOSIS — C159 Malignant neoplasm of esophagus, unspecified: Secondary | ICD-10-CM | POA: Diagnosis not present

## 2022-10-17 DIAGNOSIS — K219 Gastro-esophageal reflux disease without esophagitis: Secondary | ICD-10-CM | POA: Diagnosis not present

## 2022-10-17 DIAGNOSIS — K7030 Alcoholic cirrhosis of liver without ascites: Secondary | ICD-10-CM

## 2022-10-17 MED ORDER — PANTOPRAZOLE SODIUM 40 MG PO TBEC: 40.0000 mg | DELAYED_RELEASE_TABLET | Freq: Two times a day (BID) | ORAL | 3 refills | Status: DC

## 2022-10-17 NOTE — Progress Notes (Signed)
Referring Provider: [name], MD Primary Care Physician:  [name], MD Primary GI:  Dr. Abbey Chatters  Chief Complaint  Patient presents with   Elevated alkphos    Patient here today due to some abnormal Alkphos. Patient denies any current Gi issues.  She is on pantoprazole 40 mg bid which has helped with her GERD symptoms.     HPI:   Regina Richardson is a 62 y.o. female who presents to the clinic today for follow up visit. She has a history of GERD, squamous cell carcinoma of the esophagus resected at [facility], alcoholic cirrhosis.   In regards to her cirrhosis, initially seen in our clinic October 2021. Work-up in included hemochromatosis DNA negative for C282Y and H63D, ferritin returned to normal at 91, alk phos 195, AST 66, ALT 31, ASMA negative, ANA negative, ANA negative, IgG elevated at 1702, IgA elevated at 783.  Suspected IgA and IgG elevation may be secondary to alcohol.  AFP 3.5. Hepatitis C antibody negative, hepatitis B surface antigen negative.  Advise she continue to work towards alcohol cessation.  She was lost to follow-up.   Hospitalization at [facility] October 2023 after presenting with abdominal pain. CT A/P with contrast showing moderately severe duodenitis, hepatic cirrhosis, stable benign left adrenal adenoma. Gastrin level 294, not felt to be significantly elevated in the range of a gastrinoma, plan for repeat testing off PPI if possible as an outpatient. MELD Na of 9 while inpatient.   EGD April 27, 2022: - Scar in the middle third of the esophagus. - Mucosal nodule found in the esophagus. Biopsied. - Mucous like gastric fluid. Fluid aspiration performed. Normal stomach biopsied. - Multiple non-bleeding duodenal ulcers with a clean ulcer base (Forrest Class III). - Duodenitis -Gastric biopsy showed reactive gastropathy. -Esophageal nodule biopsy showed focal benign gastric mucosa consistent with inlet patch, squamous mucosa.  Had repeat lab work done February 2024.   Alk phos 173, AST 41, ALT 32, T. bili 0.2, negative AMA, negative ANA, negative smooth muscle, IgG 1789, IgA 688. H pylori stool Ag negative. Repeat gastrin level WNL.  Today, she states she is doing well. Has been completely sober since hospitalization in October 2023. Feels much better. No issues with swelling in her abdomen or lower extremities. No hepatic encephalopathy. No melena or hematochezia. States the 14 days off pantoprazole was hard. Since restarting it she feels well. No abdominal pain.   Pertinent procedure history:   Colonoscopy February 2019 with Dr. Fuller Plan with [facility] for abnormal cecum on CT. Findings included prominent and moderately lipomatous ileocecal valve, 9 mm sessile polyp in hepatic flexure, 8 mm sessile polyp in the descending colon, otherwise normal exam.  Pathology with sessile serrated polyp and hyperplastic polyp. Recommended repeat colonoscopy in 5 years.   EGD/colonoscopy in February 2019 for anemia and epigastric abdominal pain.  She was found to have LA grade B esophagitis without bleeding s/p biopsied, diffuse moderate inflammation and granularity in the entire examined stomach s/p biopsied, normal examined duodenum s/p biopsy. Gastric biopsy with chronic inactive gastritis without H. pylori, esophageal biopsy with mildly inflamed squamous mucosa, duodenal biopsy benign. H.pylori breast test positive 07/2017. Patient denies treatment at that time.   Procedures 06/15/2020 performed by Dr. Laural Golden: Colonoscopy: 2 polyps resected and retrieved, 1 small polyp in the distal sigmoid biopsy, external hemorrhoids.  Pathology with 1 tubular adenoma and 1 hyperplastic polyp.  EGD: Proximal esophageal web, focal esophagitis at proximal esophagus s/p biopsy, benign-appearing esophageal stenosis s/p dilated,  portal hypertensive gastropathy.  Esophageal biopsy with moderate squamous dysplasia.  Dr. Laural Golden recommended repeat EGD in about 8 weeks.   EGD March 2022: Eroded,  inflamed mucosa in the esophagus, portal hypertension gastropathy, esophageal biopsy showed moderate to severe squamous dysplasia    EGD/EUS with Dr. Rush Landmark May 2022:  Dionisio David (with contact bleeding), granular, smooth, texture changed mucosa in the esophagus at 23-26 cm was encountered. Under NBI imaging and then spraying with Lugol's stain I could visualize a lesion that was 3 cm in length and encompassed 30-40% of the esophageal wall. Biopsied quickly but not as in depth as I would have like due to increased coughing and also 2 prior EGDs with Squamous dysplasia already being noted. - Erythematous mucosa in the stomach. Biopsied. - Possible mild portal hypertensive gastropathy in proximal stomach (though again, normal spleen size and only hepatomegaly appreciated on most recent CT). - Endosonographic imaging in the esophagus showed no wall thickening throughout. - One benign lymph node was visualized in the middle paraesophageal mediastinum (level 39M). Tissue has not been obtained. However, the endosonographic appearance is suggestive of benign inflammatory changes. - Pancreatic parenchymal abnormalities consisting of lobularity were noted in the pancreatic    Biopsy showed mild chronic gastric inflammation, negative for H. pylori, distal esophageal biopsy with slight inflammation, biopsies from 23 to 36 cm showed moderate to severe squamous dysplasia.     EGD August 2022: At [facility].    Impression: - Two large plaques in the prox - middle third of the  esophagus. Clips (MR conditional) were placed. - Normal stomach. - Normal examined duodenum. - Endoscopic submucosal dissection was performed.  Resection and retrieval were complete. -Two foci of squamous cell carcinoma in situ, focally extending to the peripheral inked margin.No invasive carcinoma is seen.Multiple levels are examined   EGD October 2022: At [facility].   Impression: - Scar in the proximal esophagus and in the mid  esophagus.  Biopsied. Dilated. - Normal stomach. - Normal examined duodenum. -if path normal, repeat EGD in 1 year -path showed Esophageal squamous mucosa with reactive change. Negative for dysplasia or carcinoma.  Past Medical History:  Diagnosis Date   Alcoholism ([place])    Anxiety    Apnea 06/11/2021   Arrhythmia    heart   Arthritis    Cancer ([place])    cervical cancer in the 80s   Cirrhosis ([place]) [id number]   alcoholic;    Depression    Dysrhythmia    GERD (gastroesophageal reflux disease)    Hypertension    Osteoporosis    Scoliosis    Urinary incontinence     Past Surgical History:  Procedure Laterality Date   BIOPSY  10/11/2020   Procedure: BIOPSY;  Surgeon: Eloise Harman, DO;  Location: [facility];  Service: Endoscopy;;   BIOPSY  12/12/2020   Procedure: BIOPSY;  Surgeon: Irving Copas., MD;  Location: [facility];  Service: Gastroenterology;;   BIOPSY  04/27/2022   Procedure: BIOPSY;  Surgeon: Montez Morita, Quillian Quince, MD;  Location: [facility];  Service: Gastroenterology;;   CERVICAL BIOPSY  W/ LOOP ELECTRODE EXCISION     CESAREAN SECTION     COLONOSCOPY  08/2017   Dr. Fuller Plan;  prominent and moderately lipomatous ileocecal valve, 9 mm sessile polyp in hepatic flexure, 8 mm sessile polyp in the descending colon, otherwise normal exam.  Pathology with sessile serrated polyp and hyperplastic polyp.  Recommended repeat colonoscopy in 5 years.   COLONOSCOPY N/A 06/15/2020   Procedure:  COLONOSCOPY;  Surgeon: Rogene Houston, MD; 2 polyps resected and retrieved, 1 small polyp in the distal sigmoid biopsy, external hemorrhoids.  Pathology with 1 tubular adenoma and 1 hyperplastic polyp.    COLPOSCOPY     EGD with mucosal resection  03/16/2021   [facility]: 2 large plaques in the proximal-mid third of the esophagus s/p endoscopic submucosal resection and retrieval, placement of 4 MR conditional clips.  Normal stomach, normal examined duodenum.  Pathology revealed squamous cell  carcinoma in situ focally extending to the peripheral inked margin.  No invasive carcinoma. Recommended repeat EGD in 3 weeks.   ESOPHAGEAL DILATION N/A 06/15/2020   Procedure: ESOPHAGEAL DILATION;  Surgeon: Rogene Houston, MD;  Location: [facility];  Service: Endoscopy;  Laterality: N/A;   ESOPHAGOGASTRODUODENOSCOPY  08/2017   Dr. Fuller Plan; LA grade B esophagitis without bleeding s/p biopsied, diffuse moderate inflammation and granularity in the entire examined stomach s/p biopsied, normal examined duodenum s/p biopsy. Gastric biopsy with chronic inactive gastritis without H. pylori, esophageal biopsy with mildly inflamed squamous mucosa, duodenal biopsy benign.   ESOPHAGOGASTRODUODENOSCOPY N/A 06/15/2020   Procedure: ESOPHAGOGASTRODUODENOSCOPY (EGD);  Surgeon: Rogene Houston, MD;  Proximal esophageal web, focal esophagitis at proximal esophagus s/p biopsy, benign-appearing esophageal stenosis s/p dilated, portal hypertensive gastropathy.  Esophageal biopsy with moderate squamous dysplasia.     ESOPHAGOGASTRODUODENOSCOPY (EGD) WITH PROPOFOL N/A 10/11/2020   Surgeon: Eloise Harman, DO; Eroded, inflamed mucosa in the esophagus. Biopsied. Portal hypertensive gastropathy.  Recommended PPI twice daily.  Pathology with moderate to severe squamous dysplasia.   ESOPHAGOGASTRODUODENOSCOPY (EGD) WITH PROPOFOL N/A 12/12/2020   Surgeon: Irving Copas., MD; Dionisio David, granular, mucosa in the esophagus at 23-26 cm.  Lesion 3 cm in length and encompassed 30-40% of esophageal wall s/p quick biopsy.  Erythematous mucosa in the stomach biopsied, portal hypertensive gastropathy.  Gastric biopsy with mild chronic inflammation, negative for H. pylori.  Esophageal biopsy with moderate to severe squamous dysplasia.   ESOPHAGOGASTRODUODENOSCOPY (EGD) WITH PROPOFOL N/A 04/27/2022   Procedure: ESOPHAGOGASTRODUODENOSCOPY (EGD) WITH PROPOFOL;  Surgeon: Harvel Quale, MD;  Location: [facility];   Service: Gastroenterology;  Laterality: N/A;   FINGER SURGERY     HEMORRHOID SURGERY     left collar bone surgery     had fx, pin placed and then complitcations caused them to remove most of the bone   left forearm surgery     from left elbow down   TOTAL ABDOMINAL HYSTERECTOMY     still has ovaries   UPPER ESOPHAGEAL ENDOSCOPIC ULTRASOUND (EUS) N/A 12/12/2020   Surgeon: Irving Copas., MD;without esophageal wall thickening, 1 lymph node in the middle paraesophageal mediastinum that was suggestive of benign inflammatory changes, pancreatic lobularity in pancreatic body, queried possibility of chronic pancreatitis though complete EUS not performed and no history of pancreatitis.    Current Outpatient Medications  Medication Sig Dispense Refill   allopurinol (ZYLOPRIM) 300 MG tablet TAKE 1 TABLET BY MOUTH EVERY DAY 90 tablet 0   buPROPion (WELLBUTRIN SR) 150 MG 12 hr tablet Take 1 tablet (150 mg total) by mouth 2 (two) times daily. 180 tablet 2   diclofenac (VOLTAREN) 75 MG EC tablet TAKE 1 TABLET BY MOUTH TWICE A DAY AS NEEDED 60 tablet 5   folic acid (FOLVITE) 1 MG tablet Take 1 tablet (1 mg total) by mouth daily. 30 tablet 3   methocarbamol (ROBAXIN) 500 MG tablet Take 1 tablet (500 mg total) by mouth every 8 (eight) hours as needed for  muscle spasms. 90 tablet 5   metoprolol succinate (TOPROL-XL) 100 MG 24 hr tablet Take 1 tablet (100 mg total) by mouth daily. 90 tablet 1   ondansetron (ZOFRAN-ODT) 4 MG disintegrating tablet Take 1 tablet (4 mg total) by mouth every 8 (eight) hours as needed for nausea or vomiting. 60 tablet 2   pantoprazole (PROTONIX) 40 MG tablet Take 1 tablet (40 mg total) by mouth 2 (two) times daily. 180 tablet 2   QUEtiapine (SEROQUEL) 50 MG tablet TAKE 1 TABLET BY MOUTH EVERYDAY AT BEDTIME 90 tablet 0   rosuvastatin (CRESTOR) 20 MG tablet Take 1 tablet (20 mg total) by mouth daily. 90 tablet 3   sucralfate (CARAFATE) 1 g tablet TAKE 1 TABLET (1 G TOTAL) BY  MOUTH 4 TIMES A DAY WITH MEALS AND AT BEDTIME 120 tablet 0   Vilazodone HCl 20 MG TABS TAKE 1 TABLET BY MOUTH EVERY DAY 30 tablet 0   umeclidinium-vilanterol (ANORO ELLIPTA) 62.5-25 MCG/ACT AEPB Inhale 1 puff into the lungs every morning. (Patient not taking: Reported on 10/17/2022) 1 each 11   No current facility-administered medications for this visit.    Allergies as of 10/17/2022   (No Known Allergies)    Family History  Problem Relation Age of Onset   Alcohol abuse Mother    Liver disease Mother        alcoholic cirrhosis   Heart disease Father    Alcohol abuse Father    Lung cancer Father    Diabetes Sister    Cirrhosis Brother        secondary to alcohol.    Liver disease Brother    Liver disease Sister        Alcoholic cirrhosis   Diabetes Maternal Aunt    Kidney disease Maternal Aunt    Diabetes Cousin        mat cousin   Breast cancer Neg Hx    Colon cancer Neg Hx    Esophageal cancer Neg Hx    Stomach cancer Neg Hx    Rectal cancer Neg Hx    Pancreatic cancer Neg Hx    Inflammatory bowel disease Neg Hx     Social History   Socioeconomic History   Marital status: Divorced    Spouse name: Not on file   Number of children: 1   Years of education: 12   Highest education level: 12th grade  Occupational History   Occupation: retired  Tobacco Use   Smoking status: Every Day    Packs/day: 0.50    Years: 40.00    Additional pack years: 0.00    Total pack years: 20.00    Types: Cigarettes    Passive exposure: Current   Smokeless tobacco: Never   Tobacco comments:    Smoking Cessation Offered.  Vaping Use   Vaping Use: Never used  Substance and Sexual Activity   Alcohol use: Not Currently    Alcohol/week: 12.0 standard drinks of alcohol    Types: 12 Cans of beer per week    Comment: 12 cans of beer daily   Drug use: No    Comment: Tried cocaine and marijuana in the 60s.    Sexual activity: Not Currently    Birth control/protection: None  Other  Topics Concern   Not on file  Social History Narrative   Worked in a [employer] 32 years    Moved back to [place] from [place] Strain: High Risk (  07/08/2022)   Overall Financial Resource Strain (CARDIA)    Difficulty of Paying Living Expenses: Hard  Food Insecurity: No Food Insecurity (07/08/2022)   Hunger Vital Sign    Worried About Running Out of Food in the Last Year: Never true    Ran Out of Food in the Last Year: Never true  Transportation Needs: No Transportation Needs (07/08/2022)   PRAPARE - Hydrologist (Medical): No    Lack of Transportation (Non-Medical): No  Physical Activity: Inactive (07/08/2022)   Exercise Vital Sign    Days of Exercise per Week: 0 days    Minutes of Exercise per Session: 0 min  Stress: Stress Concern Present (07/08/2022)   [place]    Feeling of Stress : To some extent  Social Connections: Socially Isolated (07/08/2022)   Social Connection and Isolation Panel [NHANES]    Frequency of Communication with Friends and Family: More than three times a week    Frequency of Social Gatherings with Friends and Family: More than three times a week    Attends Religious Services: Never    Marine scientist or Organizations: No    Attends Archivist Meetings: Never    Marital Status: Divorced    Subjective: Review of Systems  Constitutional:  Negative for chills and fever.  HENT:  Negative for congestion and hearing loss.   Eyes:  Negative for blurred vision and double vision.  Respiratory:  Negative for cough and shortness of breath.   Cardiovascular:  Negative for chest pain and palpitations.  Gastrointestinal:  Negative for abdominal pain, blood in stool, constipation, diarrhea, heartburn, melena and vomiting.  Genitourinary:  Negative for dysuria and urgency.  Musculoskeletal:  Negative  for joint pain and myalgias.  Skin:  Negative for itching and rash.  Neurological:  Negative for dizziness and headaches.  Psychiatric/Behavioral:  Negative for depression. The patient is not nervous/anxious.      Objective: BP 128/73 (BP Location: Left Arm, Patient Position: Sitting, Cuff Size: Large)   Pulse 81   Temp 97.8 F (36.6 C) (Temporal)   Ht 5' (1.524 m)   Wt 160 lb 12.8 oz (72.9 kg)   BMI 31.40 kg/m  Physical Exam Constitutional:      Appearance: Normal appearance.  HENT:     Head: Normocephalic and atraumatic.  Eyes:     Extraocular Movements: Extraocular movements intact.     Conjunctiva/sclera: Conjunctivae normal.  Cardiovascular:     Rate and Rhythm: Normal rate and regular rhythm.  Pulmonary:     Effort: Pulmonary effort is normal.     Breath sounds: Normal breath sounds.  Abdominal:     General: Bowel sounds are normal.     Palpations: Abdomen is soft.  Musculoskeletal:        General: No swelling. Normal range of motion.     Cervical back: Normal range of motion and neck supple.  Skin:    General: Skin is warm and dry.     Coloration: Skin is not jaundiced.  Neurological:     General: No focal deficit present.     Mental Status: She is alert and oriented to person, place, and time.  Psychiatric:        Mood and Affect: Mood normal.        Behavior: Behavior normal.      Assessment/Plan:  1.  Alcohol cirrhosis-appears well compensated.  Historically has MELD less than 10.  No complications today.  Will order ultrasound for [place] screening.  Will check MELD labs in a few months to recheck her LFTs which are steadily improving since being sober from alcohol.  Continue alcohol cessation.  2.  Duodenal ulcers/chronic GERD-since her abdominal pain is vastly improved.  H. pylori stool antigen negative.  Gastrin level WNL.  No complaints today.  Continue to avoid all NSAIDs.  Continue pantoprazole twice daily.  Will reevaluate on repeat EGD in October  2024.  3.  Squamous cell carcinoma of esophagus-due for surveillance October 2024 will which we will arrange on follow-up visit.  4.  Chronic alcohol use-sober since October 23.  Congratulated her on this.  Continue all alcohol cessation.  Follow-up in 6 months.  Will call with results of ultrasound and blood work once available.  10/17/2022 9:39 AM   Disclaimer: This note was dictated with voice recognition software. Similar sounding words can inadvertently be transcribed and may not be corrected upon review.

## 2022-10-17 NOTE — Patient Instructions (Signed)
I am going to order an an ultrasound of your liver for liver cancer screening today.  I am going to check blood work at [facility] in May.  We will call with these results.  Follow-up in 6 months.  We will plan on EGD in October 2024 for surveillance purposes.  It was very nice seeing you again today.  Dr. Abbey Chatters  At [facility] we value your feedback. If you would like to leave [place] a Google review, please share your experience as we strive to create trusting relationships with our patients to provide genuine, compassionate, quality care.

## 2022-10-25 ENCOUNTER — Encounter: Payer: Self-pay | Admitting: Family Medicine

## 2022-10-25 ENCOUNTER — Ambulatory Visit (INDEPENDENT_AMBULATORY_CARE_PROVIDER_SITE_OTHER): Payer: Medicare HMO | Admitting: Family Medicine

## 2022-10-25 VITALS — BP 115/58 | HR 62 | Temp 97.5°F | Ht 60.0 in | Wt 161.0 lb

## 2022-10-25 DIAGNOSIS — F332 Major depressive disorder, recurrent severe without psychotic features: Secondary | ICD-10-CM | POA: Diagnosis not present

## 2022-10-25 MED ORDER — DULOXETINE HCL 60 MG PO CPEP: 120.0000 mg | ORAL_CAPSULE | Freq: Every day | ORAL | 5 refills | Status: DC
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
## 2022-10-27 ENCOUNTER — Encounter: Payer: Self-pay | Admitting: Family Medicine

## 2022-10-31 ENCOUNTER — Ambulatory Visit (HOSPITAL_COMMUNITY)
Admission: RE | Admit: 2022-10-31 | Discharge: 2022-10-31 | Disposition: A | Discharge status: Home / Self Care | Payer: Medicare HMO | Source: Ambulatory Visit | Attending: Internal Medicine | Admitting: Internal Medicine

## 2022-10-31 DIAGNOSIS — K8020 Calculus of gallbladder without cholecystitis without obstruction: Secondary | ICD-10-CM | POA: Diagnosis not present

## 2022-10-31 DIAGNOSIS — K7030 Alcoholic cirrhosis of liver without ascites: Secondary | ICD-10-CM | POA: Diagnosis not present

## 2022-11-08 ENCOUNTER — Ambulatory Visit (INDEPENDENT_AMBULATORY_CARE_PROVIDER_SITE_OTHER): Payer: Medicare HMO | Admitting: Family Medicine

## 2022-11-08 ENCOUNTER — Encounter: Payer: Self-pay | Admitting: Family Medicine

## 2022-11-08 VITALS — BP 134/62 | HR 72 | Temp 97.4°F | Ht 60.0 in | Wt 155.4 lb

## 2022-11-08 DIAGNOSIS — F332 Major depressive disorder, recurrent severe without psychotic features: Secondary | ICD-10-CM | POA: Diagnosis not present

## 2022-11-08 DIAGNOSIS — F418 Other specified anxiety disorders: Secondary | ICD-10-CM

## 2022-11-08 MED ORDER — HYDROXYZINE PAMOATE 25 MG PO CAPS: 25.0000 mg | ORAL_CAPSULE | Freq: Three times a day (TID) | ORAL | 2 refills | Status: DC | PRN

## 2022-11-08 MED ORDER — QUETIAPINE FUMARATE 100 MG PO TABS: ORAL_TABLET | ORAL | 1 refills | Status: DC

## 2022-11-08 NOTE — Patient Instructions (Addendum)
For referral contact: [facility] [address], [place] [address] [phone]

## 2022-11-08 NOTE — Progress Notes (Signed)
Subjective:  Patient ID: Regina Richardson, female    DOB: [birth year]  Age: 62 y.o. MRN: [id number]  CC: Depression   HPI Regina Richardson presents for recheck of depression. Pt. Denies suicidality, but confirms the rest of the symptoms below     11/08/2022    3:19 PM 11/08/2022    3:14 PM 10/25/2022   11:15 AM  Depression screen PHQ 2/9  Decreased Interest 3 0 3  Down, Depressed, Hopeless 3 0 3  PHQ - 2 Score 6 0 6  Altered sleeping 3  3  Tired, decreased energy 3  3  Change in appetite 3  3  Feeling bad or failure about yourself  3  3  Trouble concentrating 3  3  Moving slowly or fidgety/restless 3  3  Suicidal thoughts 3  3  PHQ-9 Score 27  27  Difficult doing work/chores Extremely dIfficult  Extremely dIfficult    History Regina Richardson has a past medical history of Alcoholism, Anxiety, Apnea (06/11/2021), Arrhythmia, Arthritis, Cancer, Cirrhosis (04/2020), Depression, Dysrhythmia, GERD (gastroesophageal reflux disease), Hypertension, Osteoporosis, Scoliosis, and Urinary incontinence.   She has a past surgical history that includes Total abdominal hysterectomy; left collar bone surgery; left forearm surgery; Colonoscopy (08/2017); Cesarean section; Colposcopy; Cervical biopsy w/ loop electrode excision; Hemorrhoid surgery; Esophagogastroduodenoscopy (08/2017); Finger surgery; Esophagogastroduodenoscopy (N/A, 06/15/2020); Esophageal dilation (N/A, 06/15/2020); Colonoscopy (N/A, 06/15/2020); Esophagogastroduodenoscopy (egd) with propofol (N/A, 10/11/2020); biopsy (10/11/2020); Upper esophageal endoscopic ultrasound (eus) (N/A, 12/12/2020); biopsy (12/12/2020); Esophagogastroduodenoscopy (egd) with propofol (N/A, 12/12/2020); EGD with mucosal resection (03/16/2021); Esophagogastroduodenoscopy (egd) with propofol (N/A, 04/27/2022); and biopsy (04/27/2022).   Her family history includes Alcohol abuse in her father and mother; Cirrhosis in her brother; Diabetes in her cousin, maternal aunt, and sister;  Heart disease in her father; Kidney disease in her maternal aunt; Liver disease in her brother, mother, and sister; Lung cancer in her father.She reports that she has been smoking cigarettes. She has a 20.00 pack-year smoking history. She has been exposed to tobacco smoke. She has never used smokeless tobacco. She reports that she does not currently use alcohol after a past usage of about 12.0 standard drinks of alcohol per week. She reports that she does not use drugs.    ROS Review of Systems  Constitutional: Negative.   HENT: Negative.    Eyes:  Negative for visual disturbance.  Respiratory:  Negative for shortness of breath.   Cardiovascular:  Negative for chest pain.  Gastrointestinal:  Negative for abdominal pain.  Musculoskeletal:  Negative for arthralgias.    Objective:  BP 134/62   Pulse 72   Temp (!) 97.4 F (36.3 C)   Ht 5' (1.524 m)   Wt 155 lb 6.4 oz (70.5 kg)   SpO2 98%   BMI 30.35 kg/m   BP Readings from Last 3 Encounters:  11/08/22 134/62  10/25/22 (!) 115/58  10/17/22 128/73    Wt Readings from Last 3 Encounters:  11/08/22 155 lb 6.4 oz (70.5 kg)  10/25/22 161 lb (73 kg)  10/17/22 160 lb 12.8 oz (72.9 kg)     Physical Exam Constitutional:      General: She is not in acute distress.    Appearance: She is well-developed.  Cardiovascular:     Rate and Rhythm: Normal rate and regular rhythm.  Pulmonary:     Breath sounds: Normal breath sounds.  Musculoskeletal:        General: Normal range of motion.  Skin:    General: Skin is warm and  dry.  Neurological:     Mental Status: She is alert and oriented to person, place, and time.       Assessment & Plan:   Regina Richardson was seen today for depression.  Diagnoses and all orders for this visit:  Major depressive disorder, recurrent severe without psychotic features  Depression with anxiety -     QUEtiapine (SEROQUEL) 100 MG tablet; TAKE 1 TABLET BY MOUTH EVERYDAY AT BEDTIME  Other orders -      hydrOXYzine (VISTARIL) 25 MG capsule; Take 1 capsule (25 mg total) by mouth every 8 (eight) hours as needed. For diziness and anxiety     I have changed Regina Richardson's QUEtiapine. I am also having her start on hydrOXYzine. Additionally, I am having her maintain her ondansetron, folic acid, buPROPion, metoprolol succinate, diclofenac, allopurinol, sucralfate, methocarbamol, rosuvastatin, pantoprazole, and DULoxetine.  Allergies as of 11/08/2022   No Known Allergies      Medication List        Accurate as of November 08, 2022 11:59 PM. If you have any questions, ask your nurse or doctor.          allopurinol 300 MG tablet Commonly known as: ZYLOPRIM TAKE 1 TABLET BY MOUTH EVERY DAY   buPROPion 150 MG 12 hr tablet Commonly known as: WELLBUTRIN SR Take 1 tablet (150 mg total) by mouth 2 (two) times daily.   diclofenac 75 MG EC tablet Commonly known as: VOLTAREN TAKE 1 TABLET BY MOUTH TWICE A DAY AS NEEDED   DULoxetine 60 MG capsule Commonly known as: Cymbalta Take 2 capsules (120 mg total) by mouth daily.   folic acid 1 MG tablet Commonly known as: FOLVITE Take 1 tablet (1 mg total) by mouth daily.   hydrOXYzine 25 MG capsule Commonly known as: VISTARIL Take 1 capsule (25 mg total) by mouth every 8 (eight) hours as needed. For diziness and anxiety Started by: Mechele Claude, MD   methocarbamol 500 MG tablet Commonly known as: Robaxin Take 1 tablet (500 mg total) by mouth every 8 (eight) hours as needed for muscle spasms.   metoprolol succinate 100 MG 24 hr tablet Commonly known as: TOPROL-XL Take 1 tablet (100 mg total) by mouth daily.   ondansetron 4 MG disintegrating tablet Commonly known as: ZOFRAN-ODT Take 1 tablet (4 mg total) by mouth every 8 (eight) hours as needed for nausea or vomiting.   pantoprazole 40 MG tablet Commonly known as: PROTONIX Take 1 tablet (40 mg total) by mouth 2 (two) times daily.   QUEtiapine 100 MG tablet Commonly known as:  SEROQUEL TAKE 1 TABLET BY MOUTH EVERYDAY AT BEDTIME What changed: medication strength Changed by: Mechele Claude, MD   rosuvastatin 20 MG tablet Commonly known as: CRESTOR Take 1 tablet (20 mg total) by mouth daily.   sucralfate 1 g tablet Commonly known as: CARAFATE TAKE 1 TABLET (1 G TOTAL) BY MOUTH 4 TIMES A DAY WITH MEALS AND AT BEDTIME         Follow-up: Return in about 1 month (around 12/08/2022).  Mechele Claude, M.D.

## 2022-11-09 ENCOUNTER — Encounter: Payer: Self-pay | Admitting: Internal Medicine

## 2022-11-19 ENCOUNTER — Other Ambulatory Visit (INDEPENDENT_AMBULATORY_CARE_PROVIDER_SITE_OTHER): Payer: Self-pay | Admitting: *Deleted

## 2022-11-19 ENCOUNTER — Telehealth (INDEPENDENT_AMBULATORY_CARE_PROVIDER_SITE_OTHER): Payer: Self-pay | Admitting: *Deleted

## 2022-11-19 DIAGNOSIS — K7030 Alcoholic cirrhosis of liver without ascites: Secondary | ICD-10-CM

## 2022-11-19 NOTE — Telephone Encounter (Signed)
Patient in reminder file Release labs dr carver put in at office visit on 10/17/22 and mail to patient ( per patient do not need to call) for her to do in May 2024.   Labs released and mailed to patient.

## 2022-11-22 ENCOUNTER — Other Ambulatory Visit: Payer: Self-pay | Admitting: Family Medicine

## 2022-12-04 ENCOUNTER — Other Ambulatory Visit: Payer: Self-pay | Admitting: Family Medicine

## 2022-12-11 ENCOUNTER — Other Ambulatory Visit: Payer: Self-pay | Admitting: Family Medicine

## 2022-12-11 DIAGNOSIS — E790 Hyperuricemia without signs of inflammatory arthritis and tophaceous disease: Secondary | ICD-10-CM

## 2022-12-17 ENCOUNTER — Other Ambulatory Visit: Payer: Self-pay | Admitting: Family Medicine

## 2023-01-07 DIAGNOSIS — K7030 Alcoholic cirrhosis of liver without ascites: Secondary | ICD-10-CM | POA: Diagnosis not present

## 2023-01-08 LAB — COMPREHENSIVE METABOLIC PANEL
ALT: 17 IU/L (ref 0–32)
AST: 24 IU/L (ref 0–40)
Albumin: 4.3 g/dL (ref 3.9–4.9)
Alkaline Phosphatase: 117 IU/L (ref 44–121)
BUN/Creatinine Ratio: 18 (ref 12–28)
BUN: 14 mg/dL (ref 8–27)
Bilirubin Total: 0.3 mg/dL (ref 0.0–1.2)
CO2: 20 mmol/L (ref 20–29)
Calcium: 9.8 mg/dL (ref 8.7–10.3)
Chloride: 103 mmol/L (ref 96–106)
Creatinine, Ser: 0.77 mg/dL (ref 0.57–1.00)
Globulin, Total: 3.4 g/dL (ref 1.5–4.5)
Glucose: 90 mg/dL (ref 70–99)
Potassium: 3.9 mmol/L (ref 3.5–5.2)
Sodium: 139 mmol/L (ref 134–144)
Total Protein: 7.7 g/dL (ref 6.0–8.5)
eGFR: 87 mL/min/{1.73_m2} (ref >59)

## 2023-01-08 LAB — PROTIME-INR
INR: 1.1 (ref 0.9–1.2)
Prothrombin Time: 11.5 s (ref 9.1–12.0)

## 2023-01-08 LAB — AFP TUMOR MARKER: AFP, Serum, Tumor Marker: 2.2 ng/mL (ref 0.0–9.2)

## 2023-01-29 ENCOUNTER — Ambulatory Visit (INDEPENDENT_AMBULATORY_CARE_PROVIDER_SITE_OTHER): Payer: Medicare HMO | Admitting: Family Medicine

## 2023-01-29 ENCOUNTER — Encounter: Payer: Self-pay | Admitting: Family Medicine

## 2023-01-29 VITALS — BP 118/58 | HR 64 | Temp 98.2°F | Ht 60.0 in | Wt 154.6 lb

## 2023-01-29 DIAGNOSIS — K269 Duodenal ulcer, unspecified as acute or chronic, without hemorrhage or perforation: Secondary | ICD-10-CM

## 2023-01-29 DIAGNOSIS — E782 Mixed hyperlipidemia: Secondary | ICD-10-CM | POA: Diagnosis not present

## 2023-01-29 DIAGNOSIS — E790 Hyperuricemia without signs of inflammatory arthritis and tophaceous disease: Secondary | ICD-10-CM

## 2023-01-29 DIAGNOSIS — K7030 Alcoholic cirrhosis of liver without ascites: Secondary | ICD-10-CM

## 2023-01-29 DIAGNOSIS — F418 Other specified anxiety disorders: Secondary | ICD-10-CM

## 2023-01-29 DIAGNOSIS — M4126 Other idiopathic scoliosis, lumbar region: Secondary | ICD-10-CM

## 2023-01-29 DIAGNOSIS — F332 Major depressive disorder, recurrent severe without psychotic features: Secondary | ICD-10-CM | POA: Diagnosis not present

## 2023-01-29 DIAGNOSIS — K219 Gastro-esophageal reflux disease without esophagitis: Secondary | ICD-10-CM | POA: Diagnosis not present

## 2023-01-29 DIAGNOSIS — I10 Essential (primary) hypertension: Secondary | ICD-10-CM

## 2023-01-29 MED ORDER — DICLOFENAC SODIUM 75 MG PO TBEC: 75.0000 mg | DELAYED_RELEASE_TABLET | Freq: Two times a day (BID) | ORAL | 3 refills | Status: DC

## 2023-01-29 MED ORDER — QUETIAPINE FUMARATE 100 MG PO TABS
ORAL_TABLET | ORAL | 1 refills | Status: DC
Start: 2023-01-29 — End: 2023-11-08

## 2023-01-29 MED ORDER — DULOXETINE HCL 60 MG PO CPEP: 120.0000 mg | ORAL_CAPSULE | Freq: Every day | ORAL | 1 refills | Status: DC

## 2023-01-29 MED ORDER — SUCRALFATE 1 G PO TABS: 1.0000 g | ORAL_TABLET | Freq: Three times a day (TID) | ORAL | 1 refills | Status: DC

## 2023-01-29 MED ORDER — BUPROPION HCL ER (SR) 150 MG PO TB12: 150.0000 mg | ORAL_TABLET | Freq: Two times a day (BID) | ORAL | 2 refills | Status: DC

## 2023-01-29 MED ORDER — METOPROLOL SUCCINATE ER 100 MG PO TB24: 100.0000 mg | ORAL_TABLET | Freq: Every day | ORAL | 3 refills | Status: DC

## 2023-01-29 MED ORDER — DICLOFENAC SODIUM 75 MG PO TBEC: 75.0000 mg | DELAYED_RELEASE_TABLET | Freq: Two times a day (BID) | ORAL | 5 refills | Status: DC | PRN

## 2023-01-29 MED ORDER — ALLOPURINOL 300 MG PO TABS: 300.0000 mg | ORAL_TABLET | Freq: Every day | ORAL | 3 refills | Status: DC

## 2023-01-29 MED ORDER — PANTOPRAZOLE SODIUM 40 MG PO TBEC
40.0000 mg | DELAYED_RELEASE_TABLET | Freq: Two times a day (BID) | ORAL | 3 refills | Status: DC
Start: 2023-01-29 — End: 2023-09-18

## 2023-01-29 MED ORDER — ROSUVASTATIN CALCIUM 20 MG PO TABS: 20.0000 mg | ORAL_TABLET | Freq: Every day | ORAL | 3 refills | Status: DC

## 2023-01-29 MED ORDER — FOLIC ACID 1 MG PO TABS: 1.0000 mg | ORAL_TABLET | Freq: Every day | ORAL | 3 refills | Status: DC
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
## 2023-02-27 ENCOUNTER — Ambulatory Visit (INDEPENDENT_AMBULATORY_CARE_PROVIDER_SITE_OTHER): Payer: Medicare HMO | Admitting: Family Medicine

## 2023-02-27 ENCOUNTER — Other Ambulatory Visit: Payer: Self-pay | Admitting: Family Medicine

## 2023-02-27 ENCOUNTER — Encounter: Payer: Self-pay | Admitting: Family Medicine

## 2023-02-27 VITALS — BP 102/49 | HR 64 | Temp 97.1°F | Ht 60.0 in | Wt 156.2 lb

## 2023-02-27 DIAGNOSIS — R300 Dysuria: Secondary | ICD-10-CM | POA: Diagnosis not present

## 2023-02-27 MED ORDER — BUPROPION HCL ER (XL) 450 MG PO TB24
450.0000 mg | ORAL_TABLET | Freq: Every day | ORAL | 1 refills | Status: DC
Start: 2023-02-27 — End: 2023-02-27

## 2023-02-27 MED ORDER — BUDESONIDE-FORMOTEROL FUMARATE 160-4.5 MCG/ACT IN AERO: 2.0000 | INHALATION_SPRAY | Freq: Two times a day (BID) | RESPIRATORY_TRACT | 5 refills | Status: AC
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
## 2023-02-27 NOTE — Telephone Encounter (Signed)
buPROPion (WELLBUTRIN XL) 150 MG 24 hr tablet        Changed from: buPROPion 450 MG TB24   Pharmacy comment: Alternative Requested:NOT COVERED.   All Pharmacy Suggested Alternatives:  buPROPion (WELLBUTRIN XL) 150 MG 24 hr tablet FLUoxetine (PROZAC) 40 MG capsule venlafaxine (EFFEXOR) 75 MG tablet escitalopram (LEXAPRO) 10 MG tablet sertraline (ZOLOFT) 100 MG tablet

## 2023-03-11 ENCOUNTER — Institutional Professional Consult (permissible substitution): Payer: Medicare HMO | Admitting: Professional Counselor

## 2023-03-13 ENCOUNTER — Encounter: Payer: Self-pay | Admitting: Family Medicine

## 2023-03-13 ENCOUNTER — Other Ambulatory Visit: Payer: Self-pay | Admitting: Family Medicine

## 2023-04-01 ENCOUNTER — Other Ambulatory Visit: Payer: Medicare HMO

## 2023-04-01 ENCOUNTER — Ambulatory Visit: Payer: Medicare HMO | Admitting: Family Medicine

## 2023-04-01 ENCOUNTER — Ambulatory Visit: Payer: Medicare HMO | Admitting: Professional Counselor

## 2023-04-01 DIAGNOSIS — R300 Dysuria: Secondary | ICD-10-CM | POA: Diagnosis not present

## 2023-04-01 DIAGNOSIS — F332 Major depressive disorder, recurrent severe without psychotic features: Secondary | ICD-10-CM

## 2023-04-01 LAB — MICROSCOPIC EXAMINATION
Bacteria, UA: NONE SEEN
RBC, Urine: NONE SEEN /HPF (ref 0–2)
Renal Epithel, UA: NONE SEEN /HPF
Yeast, UA: NONE SEEN

## 2023-04-01 NOTE — Patient Outreach (Signed)
  Care Coordination   Follow Up Visit Note   04/01/2023 late entry for 09/24/22 Name: LAPORCHIA NJOROGE MRN: [id number] DOB: [birth year]  CEASIA DEFRAIN is a 62 y.o. year old female who sees Mechele Claude, MD for primary care. I spoke with  Consuello Masse by phone today.  What matters to the patients health and wellness today?  Sleep Continues with poor sleep patterns    Goals Addressed               This Visit's Progress     Patient Stated     Increase quality of sleep (THN) (pt-stated)        Care Coordination Interventions: Evaluation of current treatment plan related to sleep disturbance  and patient's adherence to plan as established by provider Discussed plans with patient for ongoing care management follow up and provided patient with direct contact information for care management team   Confirmed she continues with sleep concerns- moved from her bed to couch Interventions Today    Flowsheet Row Most Recent Value  Chronic Disease   Chronic disease during today's visit Chronic Obstructive Pulmonary Disease (COPD), Other  [sleep disorder]  General Interventions   General Interventions Discussed/Reviewed General Interventions Discussed, Programmer, applications, Doctor Visits  Doctor Visits Discussed/Reviewed Doctor Visits Reviewed, PCP  PCP/Specialist Visits Compliance with follow-up visit  Education Interventions   Education Provided Provided Education  Provided Verbal Education On When to see the doctor, Other  [discussed better sleep habits]              SDOH assessments and interventions completed:  No     Care Coordination Interventions:  Yes, provided   Follow up plan: Follow up call scheduled for 04/04/23    Encounter Outcome:  Patient Visit Completed   Cala Bradford L. Noelle Penner, RN, BSN, CCM, Care Management Coordinator [phone]

## 2023-04-01 NOTE — Patient Instructions (Signed)
If your symptoms worsen or you have thoughts of suicide/homicide, PLEASE SEEK IMMEDIATE MEDICAL ATTENTION.  You may always call:   National Suicide Hotline: 988 or 800-273-8255 [place] Crisis Line: 336-832-9700 Crisis Recovery in [place]: 800-939-5911      These are available 24 hours a day, 7 days a week.  
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
## 2023-04-01 NOTE — Patient Instructions (Signed)
Visit Information  Thank you for taking time to visit with me today. Please don't hesitate to contact me if I can be of assistance to you.   Following are the goals we discussed today:   Goals Addressed               This Visit's Progress     Patient Stated     Increase quality of sleep (THN) (pt-stated)        Care Coordination Interventions: Evaluation of current treatment plan related to sleep disturbance  and patient's adherence to plan as established by provider Discussed plans with patient for ongoing care management follow up and provided patient with direct contact information for care management team   Confirmed she continues with sleep concerns- moved from her bed to couch Interventions Today    Flowsheet Row Most Recent Value  Chronic Disease   Chronic disease during today's visit Chronic Obstructive Pulmonary Disease (COPD), Other  [sleep disorder]  General Interventions   General Interventions Discussed/Reviewed General Interventions Discussed, [facility], Doctor Visits  Doctor Visits Discussed/Reviewed Doctor Visits Reviewed, PCP  PCP/Specialist Visits Compliance with follow-up visit  Education Interventions   Education Provided Provided Education  Provided Verbal Education On When to see the doctor, Other  [discussed better sleep habits]              Our next appointment is by telephone on 04/04/23 at 2 pm  Please call the care guide team at [phone] if you need to cancel or reschedule your appointment.   If you are experiencing a Mental Health or Behavioral Health Crisis or need someone to talk to, please call the Suicide and Crisis Lifeline: 988 call the [place] National Suicide Prevention Lifeline: 907-044-9974 or TTY: 763-219-9702 TTY 239 786 2639) to talk to a trained counselor call 1-800-273-TALK (toll free, 24 hour hotline) call the [facility]: (419)767-0989 call 911   No computer access, no preference for copy of  AVS      The patient has been provided with contact information for the care management team and has been advised to call with any health related questions or concerns.   Solina Heron L. Noelle Penner, RN, BSN, CCM, Care Management Coordinator [phone]

## 2023-04-03 ENCOUNTER — Telehealth (INDEPENDENT_AMBULATORY_CARE_PROVIDER_SITE_OTHER): Payer: Medicare HMO | Admitting: Professional Counselor

## 2023-04-03 ENCOUNTER — Encounter: Payer: Self-pay | Admitting: *Deleted

## 2023-04-03 DIAGNOSIS — F331 Major depressive disorder, recurrent, moderate: Secondary | ICD-10-CM | POA: Diagnosis not present
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
## 2023-04-04 ENCOUNTER — Ambulatory Visit: Payer: Self-pay | Admitting: *Deleted

## 2023-04-04 NOTE — Patient Outreach (Signed)
Care Coordination   Follow Up Visit Note   04/04/2023 Name: Regina Richardson MRN: [id number] DOB: March 11, 1961  Regina Richardson is a 62 y.o. year old female who sees Mechele Claude, MD for primary care. I spoke with  Consuello Masse by phone today.  What matters to the patients health and wellness today?  Sleep disorder  Patient excited as she is going to the beach with her son today  Sleep- confirms she is still not sleeping well at night. History of insomnia Patient does not prefer getting a mask CPAP as she reports she is claustrophobic She takes Seroquel at night but without success She also has access to hydroxyzine  She denies any other medical concern at this time   Goals Addressed               This Visit's Progress     Patient Stated     COMPLETED: Behavioral health resources [facility]) (pt-stated)        Care Coordination Interventions: Social Work referral for depression, alcohol cessation, counselor, psychiatrist found to be active on 07/20/22  Continues not to have connected to [facility] SW Interventions Today    Flowsheet Row Most Recent Value  Chronic Disease   Chronic disease during today's visit Other  [dental services. follow up with [facility] SW]  General Interventions   General Interventions Discussed/Reviewed Communication with, General Interventions Discussed, Community Resources  [reviewed [facility] SW visit for resources, check to see if items receive, offered to answer any questions]  Doctor Visits Discussed/Reviewed Doctor Visits Discussed, PCP, Specialist  [discused upcoming pcp visit, updated dental provider in care teams. Assesed concerns with her new dentures & use]  Durable Medical Equipment (DME) Other  [dentures]  PCP/Specialist Visits Compliance with follow-up visit  Communication with Social Work  [sent message to [facility] SW of patient interest in meeting at a doctor appointment]  Education Interventions   Provided Verbal Education On Other  [Discussed and encouraged her  to re apply for medicaid related to the 06/22/22 [place] medicaid expansion]  Mental Health Interventions   Mental Health Discussed/Reviewed Mental Health Discussed, Other  [discussed her referral to [facility] SW for counseling & resources,]           Increase quality of sleep (care management coordinator) (pt-stated)        Care Coordination Interventions: Evaluation of current treatment plan related to sleep disturbance  and patient's adherence to plan as established by provider Discussed plans with patient for ongoing care management follow up and provided patient with direct contact information for care management team   Confirmed she continues with sleep concerns- moved from her bed to couch Interventions Today    Flowsheet Row Most Recent Value  Chronic Disease   Chronic disease during today's visit Other  [trouble sleeping]  General Interventions   General Interventions Discussed/Reviewed Doctor Visits  Doctor Visits Discussed/Reviewed Doctor Visits Reviewed, PCP, Specialist  PCP/Specialist Visits Compliance with follow-up visit  Exercise Interventions   Exercise Discussed/Reviewed Exercise Discussed, Physical Activity  Physical Activity Discussed/Reviewed Physical Activity Reviewed  Education Interventions   Education Provided Provided Education  Provided Verbal Education On Medication, When to see the doctor  Mental Health Interventions   Mental Health Discussed/Reviewed Mental Health Reviewed, Coping Strategies  Nutrition Interventions   Nutrition Discussed/Reviewed Nutrition Reviewed, Fluid intake  Pharmacy Interventions   Pharmacy Dicussed/Reviewed Pharmacy Topics Reviewed, Affording Medications  Safety Interventions   Safety Discussed/Reviewed Safety Reviewed, Fall Risk  SDOH assessments and interventions completed:  No     Care Coordination Interventions:  Yes, provided   Follow up plan: Follow up call scheduled for 05/09/23    Encounter Outcome:   Patient Visit Completed    Cala Bradford L. Noelle Penner, RN, BSN, CCM, Care Management Coordinator [phone]

## 2023-04-04 NOTE — Patient Instructions (Signed)
Visit Information  Thank you for taking time to visit with me today. Please don't hesitate to contact me if I can be of assistance to you.   Following are the goals we discussed today:   Goals Addressed               This Visit's Progress     Patient Stated     COMPLETED: Behavioral health resources [facility]) (pt-stated)        Care Coordination Interventions: Social Work referral for depression, alcohol cessation, counselor, psychiatrist found to be active on 07/20/22  Continues not to have connected to [facility] SW Interventions Today    Flowsheet Row Most Recent Value  Chronic Disease   Chronic disease during today's visit Other  [dental services. follow up with [facility] SW]  General Interventions   General Interventions Discussed/Reviewed Communication with, General Interventions Discussed, Community Resources  [reviewed [facility] SW visit for resources, check to see if items receive, offered to answer any questions]  Doctor Visits Discussed/Reviewed Doctor Visits Discussed, PCP, Specialist  [discused upcoming pcp visit, updated dental provider in care teams. Assesed concerns with her new dentures & use]  Durable Medical Equipment (DME) Other  [dentures]  PCP/Specialist Visits Compliance with follow-up visit  Communication with Social Work  [sent message to [facility] SW of patient interest in meeting at a doctor appointment]  Education Interventions   Provided Verbal Education On Other  [Discussed and encouraged her to re apply for medicaid related to the 06/22/22 [place] medicaid expansion]  Mental Health Interventions   Mental Health Discussed/Reviewed Mental Health Discussed, Other  [discussed her referral to [facility] SW for counseling & resources,]           Increase quality of sleep (care management coordinator) (pt-stated)        Care Coordination Interventions: Evaluation of current treatment plan related to sleep disturbance  and patient's adherence to plan as established by provider Discussed plans  with patient for ongoing care management follow up and provided patient with direct contact information for care management team   Confirmed she continues with sleep concerns- moved from her bed to couch Interventions Today    Flowsheet Row Most Recent Value  Chronic Disease   Chronic disease during today's visit Other  [trouble sleeping]  General Interventions   General Interventions Discussed/Reviewed Doctor Visits  Doctor Visits Discussed/Reviewed Doctor Visits Reviewed, PCP, Specialist  PCP/Specialist Visits Compliance with follow-up visit  Exercise Interventions   Exercise Discussed/Reviewed Exercise Discussed, Physical Activity  Physical Activity Discussed/Reviewed Physical Activity Reviewed  Education Interventions   Education Provided Provided Education  Provided Verbal Education On Medication, When to see the doctor  Mental Health Interventions   Mental Health Discussed/Reviewed Mental Health Reviewed, Coping Strategies  Nutrition Interventions   Nutrition Discussed/Reviewed Nutrition Reviewed, Fluid intake  Pharmacy Interventions   Pharmacy Dicussed/Reviewed Pharmacy Topics Reviewed, Affording Medications  Safety Interventions   Safety Discussed/Reviewed Safety Reviewed, Fall Risk              Our next appointment is by telephone on 05/09/23 at 2 pm  Please call the care guide team at [phone] if you need to cancel or reschedule your appointment.   If you are experiencing a Mental Health or Behavioral Health Crisis or need someone to talk to, please call the Suicide and Crisis Lifeline: 988 call the [place] National Suicide Prevention Lifeline: 343-183-5743 or TTY: (640) 602-6167 TTY 910-009-7381) to talk to a trained counselor call 1-800-273-TALK (toll free, 24 hour hotline) call the [place]  [employer]: (419)112-6619 call 911    No computer access, no preference for copy of AVS      The patient has been provided with contact information for the  care management team and has been advised to call with any health related questions or concerns.   Rashad Auld L. Noelle Penner, RN, BSN, CCM, Care Management Coordinator [phone]

## 2023-04-09 ENCOUNTER — Ambulatory Visit: Payer: Medicare HMO | Admitting: Professional Counselor

## 2023-04-10 ENCOUNTER — Encounter: Payer: Self-pay | Admitting: Family Medicine

## 2023-04-24 ENCOUNTER — Encounter: Payer: Self-pay | Admitting: Internal Medicine

## 2023-04-24 ENCOUNTER — Ambulatory Visit: Payer: Medicare HMO | Admitting: Internal Medicine

## 2023-04-24 VITALS — BP 147/68 | HR 75 | Temp 98.1°F | Ht 60.0 in | Wt 158.3 lb

## 2023-04-24 DIAGNOSIS — K219 Gastro-esophageal reflux disease without esophagitis: Secondary | ICD-10-CM

## 2023-04-24 DIAGNOSIS — C159 Malignant neoplasm of esophagus, unspecified: Secondary | ICD-10-CM | POA: Diagnosis not present

## 2023-04-24 DIAGNOSIS — R1312 Dysphagia, oropharyngeal phase: Secondary | ICD-10-CM

## 2023-04-24 DIAGNOSIS — K581 Irritable bowel syndrome with constipation: Secondary | ICD-10-CM

## 2023-04-24 DIAGNOSIS — K7030 Alcoholic cirrhosis of liver without ascites: Secondary | ICD-10-CM

## 2023-04-24 NOTE — Progress Notes (Signed)
Referring Provider: Mechele Claude, MD Primary Care Physician:  Mechele Claude, MD Primary GI:  Dr. Marletta Lor  Chief Complaint  Patient presents with   Dysphagia    Patient has concerns about dysphagia. Has gotten choked. Eats real slow due to difficulty swallowing.    Vomiting    Patient states today she vomited twice. Ate 2 chicken nuggets this morning. Keeps nausea.  Patient states she felt like she was having a heart attack while she was vomiting today.    Constipation    States she has taken everything otc to have BM and nothing is working.     HPI:   Regina Richardson is a 62 y.o. female who presents to the clinic today for follow up visit. She has a history of GERD, squamous cell carcinoma of the esophagus resected at [facility], alcoholic cirrhosis.   Cirrhosis: initially seen in our clinic October 2021. Work-up in included hemochromatosis DNA negative for C282Y and H63D, ferritin returned to normal at 91, alk phos 195, AST 66, ALT 31, ASMA negative, ANA negative, ANA negative, IgG elevated at 1702, IgA elevated at 783.  Suspected IgA and IgG elevation may be secondary to alcohol.  AFP 3.5. Hepatitis C antibody negative, hepatitis B surface antigen negative.  Advise she continue to work towards alcohol cessation.  She was lost to follow-up.   Hospitalization at [facility] October 2023 after presenting with abdominal pain. CT A/P with contrast showing moderately severe duodenitis, hepatic cirrhosis, stable benign left adrenal adenoma. Gastrin level 294, not felt to be significantly elevated in the range of a gastrinoma, plan for repeat testing off PPI if possible as an outpatient. MELD Na of 9 while inpatient.   EGD April 27, 2022: - Scar in the middle third of the esophagus. - Mucosal nodule found in the esophagus. Biopsied. - Mucous like gastric fluid. Fluid aspiration performed. Normal stomach biopsied. - Multiple non-bleeding duodenal ulcers with a clean ulcer base (Forrest Class III). -  Duodenitis -Gastric biopsy showed reactive gastropathy. -Esophageal nodule biopsy showed focal benign gastric mucosa consistent with inlet patch, squamous mucosa.  Had repeat lab work done February 2024.  Alk phos 173, AST 41, ALT 32, T. bili 0.2, negative AMA, negative ANA, negative smooth muscle, IgG 1789, IgA 688. H pylori stool Ag negative. Repeat gastrin level WNL.  MELD 3.0 of 8  No history of hepatic encephalopathy.  No history of portal hypertension, ascites, esophageal varices.  Duodenal ulcers/chronic GERD-abdominal pain has resolved.  H. pylori stool antigen negative.  Gastrin level WNL.  GERD well-controlled.  Does complain to me about worsening oropharyngeal dysphagia.  Also notes some associated odynophagia feels like pills get stuck in the back of her throat.  Pertinent procedure history:   Colonoscopy February 2019 with Dr. Russella Dar with [facility] for abnormal cecum on CT. Findings included prominent and moderately lipomatous ileocecal valve, 9 mm sessile polyp in hepatic flexure, 8 mm sessile polyp in the descending colon, otherwise normal exam.  Pathology with sessile serrated polyp and hyperplastic polyp. Recommended repeat colonoscopy in 5 years.   EGD/colonoscopy in February 2019 for anemia and epigastric abdominal pain.  She was found to have LA grade B esophagitis without bleeding s/p biopsied, diffuse moderate inflammation and granularity in the entire examined stomach s/p biopsied, normal examined duodenum s/p biopsy. Gastric biopsy with chronic inactive gastritis without H. pylori, esophageal biopsy with mildly inflamed squamous mucosa, duodenal biopsy benign. H.pylori breast test positive 07/2017. Patient denies treatment at that time.  Procedures 06/15/2020 performed by Dr. Karilyn Cota: Colonoscopy: 2 polyps resected and retrieved, 1 small polyp in the distal sigmoid biopsy, external hemorrhoids.  Pathology with 1 tubular adenoma and 1 hyperplastic polyp.  EGD: Proximal  esophageal web, focal esophagitis at proximal esophagus s/p biopsy, benign-appearing esophageal stenosis s/p dilated, portal hypertensive gastropathy.  Esophageal biopsy with moderate squamous dysplasia.  Dr. Karilyn Cota recommended repeat EGD in about 8 weeks.   EGD March 2022: Eroded, inflamed mucosa in the esophagus, portal hypertension gastropathy, esophageal biopsy showed moderate to severe squamous dysplasia    EGD/EUS with Dr. Meridee Score May 2022:  Arnoldo Lenis (with contact bleeding), granular, smooth, texture changed mucosa in the esophagus at 23-26 cm was encountered. Under NBI imaging and then spraying with Lugol's stain I could visualize a lesion that was 3 cm in length and encompassed 30-40% of the esophageal wall. Biopsied quickly but not as in depth as I would have like due to increased coughing and also 2 prior EGDs with Squamous dysplasia already being noted. - Erythematous mucosa in the stomach. Biopsied. - Possible mild portal hypertensive gastropathy in proximal stomach (though again, normal spleen size and only hepatomegaly appreciated on most recent CT). - Endosonographic imaging in the esophagus showed no wall thickening throughout. - One benign lymph node was visualized in the middle paraesophageal mediastinum (level 42M). Tissue has not been obtained. However, the endosonographic appearance is suggestive of benign inflammatory changes. - Pancreatic parenchymal abnormalities consisting of lobularity were noted in the pancreatic    Biopsy showed mild chronic gastric inflammation, negative for H. pylori, distal esophageal biopsy with slight inflammation, biopsies from 23 to 36 cm showed moderate to severe squamous dysplasia.   EGD August 2022: At [facility].    Impression: - Two large plaques in the prox - middle third of the  esophagus. Clips (MR conditional) were placed. - Normal stomach. - Normal examined duodenum. - Endoscopic submucosal dissection was performed.  Resection and  retrieval were complete. -Two foci of squamous cell carcinoma in situ, focally extending to the peripheral inked margin.No invasive carcinoma is seen.Multiple levels are examined   EGD October 2022: At [facility].   Impression: - Scar in the proximal esophagus and in the mid  esophagus. Biopsied. Dilated. - Normal stomach. - Normal examined duodenum. -if path normal, repeat EGD in 1 year -path showed Esophageal squamous mucosa with reactive change. Negative for dysplasia or carcinoma.  Past Medical History:  Diagnosis Date   Alcoholism (HCC)    Anxiety    Apnea 06/11/2021   Arrhythmia    heart   Arthritis    Cancer (HCC)    cervical cancer in the 80s   Cirrhosis (HCC) 04/2020   alcoholic;    Depression    Dysrhythmia    GERD (gastroesophageal reflux disease)    Hypertension    Osteoporosis    Scoliosis    Urinary incontinence     Past Surgical History:  Procedure Laterality Date   BIOPSY  10/11/2020   Procedure: BIOPSY;  Surgeon: Lanelle Bal, DO;  Location: [facility];  Service: Endoscopy;;   BIOPSY  12/12/2020   Procedure: BIOPSY;  Surgeon: Lemar Lofty., MD;  Location: [facility];  Service: Gastroenterology;;   BIOPSY  04/27/2022   Procedure: BIOPSY;  Surgeon: Dolores Frame, MD;  Location: [facility];  Service: Gastroenterology;;   CERVICAL BIOPSY  W/ LOOP ELECTRODE EXCISION     CESAREAN SECTION     COLONOSCOPY  08/2017   Dr. Russella Dar;  prominent  and moderately lipomatous ileocecal valve, 9 mm sessile polyp in hepatic flexure, 8 mm sessile polyp in the descending colon, otherwise normal exam.  Pathology with sessile serrated polyp and hyperplastic polyp.  Recommended repeat colonoscopy in 5 years.   COLONOSCOPY N/A 06/15/2020   Procedure: COLONOSCOPY;  Surgeon: Malissa Hippo, MD; 2 polyps resected and retrieved, 1 small polyp in the distal sigmoid biopsy, external hemorrhoids.  Pathology with 1 tubular adenoma and 1 hyperplastic polyp.     COLPOSCOPY     EGD with mucosal resection  03/16/2021   [facility]: 2 large plaques in the proximal-mid third of the esophagus s/p endoscopic submucosal resection and retrieval, placement of 4 MR conditional clips.  Normal stomach, normal examined duodenum.  Pathology revealed squamous cell carcinoma in situ focally extending to the peripheral inked margin.  No invasive carcinoma. Recommended repeat EGD in 3 weeks.   ESOPHAGEAL DILATION N/A 06/15/2020   Procedure: ESOPHAGEAL DILATION;  Surgeon: Malissa Hippo, MD;  Location: [facility];  Service: Endoscopy;  Laterality: N/A;   ESOPHAGOGASTRODUODENOSCOPY  08/2017   Dr. Russella Dar; LA grade B esophagitis without bleeding s/p biopsied, diffuse moderate inflammation and granularity in the entire examined stomach s/p biopsied, normal examined duodenum s/p biopsy. Gastric biopsy with chronic inactive gastritis without H. pylori, esophageal biopsy with mildly inflamed squamous mucosa, duodenal biopsy benign.   ESOPHAGOGASTRODUODENOSCOPY N/A 06/15/2020   Procedure: ESOPHAGOGASTRODUODENOSCOPY (EGD);  Surgeon: Malissa Hippo, MD;  Proximal esophageal web, focal esophagitis at proximal esophagus s/p biopsy, benign-appearing esophageal stenosis s/p dilated, portal hypertensive gastropathy.  Esophageal biopsy with moderate squamous dysplasia.     ESOPHAGOGASTRODUODENOSCOPY (EGD) WITH PROPOFOL N/A 10/11/2020   Surgeon: Lanelle Bal, DO; Eroded, inflamed mucosa in the esophagus. Biopsied. Portal hypertensive gastropathy.  Recommended PPI twice daily.  Pathology with moderate to severe squamous dysplasia.   ESOPHAGOGASTRODUODENOSCOPY (EGD) WITH PROPOFOL N/A 12/12/2020   Surgeon: Lemar Lofty., MD; Arnoldo Lenis, granular, mucosa in the esophagus at 23-26 cm.  Lesion 3 cm in length and encompassed 30-40% of esophageal wall s/p quick biopsy.  Erythematous mucosa in the stomach biopsied, portal hypertensive gastropathy.  Gastric biopsy with mild chronic inflammation,  negative for H. pylori.  Esophageal biopsy with moderate to severe squamous dysplasia.   ESOPHAGOGASTRODUODENOSCOPY (EGD) WITH PROPOFOL N/A 04/27/2022   Procedure: ESOPHAGOGASTRODUODENOSCOPY (EGD) WITH PROPOFOL;  Surgeon: Dolores Frame, MD;  Location: [facility];  Service: Gastroenterology;  Laterality: N/A;   FINGER SURGERY     HEMORRHOID SURGERY     left collar bone surgery     had fx, pin placed and then complitcations caused them to remove most of the bone   left forearm surgery     from left elbow down   TOTAL ABDOMINAL HYSTERECTOMY     still has ovaries   UPPER ESOPHAGEAL ENDOSCOPIC ULTRASOUND (EUS) N/A 12/12/2020   Surgeon: Lemar Lofty., MD;without esophageal wall thickening, 1 lymph node in the middle paraesophageal mediastinum that was suggestive of benign inflammatory changes, pancreatic lobularity in pancreatic body, queried possibility of chronic pancreatitis though complete EUS not performed and no history of pancreatitis.    Current Outpatient Medications  Medication Sig Dispense Refill   allopurinol (ZYLOPRIM) 300 MG tablet Take 1 tablet (300 mg total) by mouth daily. 90 tablet 3   buPROPion (WELLBUTRIN XL) 150 MG 24 hr tablet TAKE 3 TABLETS BY MOUTH DAILY. 270 tablet 0   DULoxetine (CYMBALTA) 60 MG capsule Take 2 capsules (120 mg total) by mouth daily. 180 capsule 1  folic acid (FOLVITE) 1 MG tablet Take 1 tablet (1 mg total) by mouth daily. 90 tablet 3   methocarbamol (ROBAXIN) 500 MG tablet Take 1 tablet (500 mg total) by mouth every 8 (eight) hours as needed for muscle spasms. 90 tablet 5   metoprolol succinate (TOPROL-XL) 100 MG 24 hr tablet Take 1 tablet (100 mg total) by mouth daily. 90 tablet 3   pantoprazole (PROTONIX) 40 MG tablet Take 1 tablet (40 mg total) by mouth 2 (two) times daily. 180 tablet 3   QUEtiapine (SEROQUEL) 100 MG tablet TAKE 1 TABLET BY MOUTH EVERYDAY AT BEDTIME 90 tablet 1   rosuvastatin (CRESTOR) 20 MG tablet Take 1  tablet (20 mg total) by mouth daily. 90 tablet 3   budesonide-formoterol (SYMBICORT) 160-4.5 MCG/ACT inhaler Inhale 2 puffs into the lungs 2 (two) times daily. (Patient not taking: Reported on 04/24/2023) 1 each 5   diclofenac (VOLTAREN) 75 MG EC tablet Take 1 tablet (75 mg total) by mouth 2 (two) times daily. (Patient not taking: Reported on 04/24/2023) 180 tablet 3   hydrOXYzine (VISTARIL) 25 MG capsule TAKE 1 CAPSULE (25 MG TOTAL) BY MOUTH EVERY 8 (EIGHT) HOURS AS NEEDED. FOR DIZINESS AND ANXIETY (Patient not taking: Reported on 04/24/2023) 150 capsule 1   ondansetron (ZOFRAN-ODT) 4 MG disintegrating tablet Take 1 tablet (4 mg total) by mouth every 8 (eight) hours as needed for nausea or vomiting. (Patient not taking: Reported on 04/24/2023) 60 tablet 2   sucralfate (CARAFATE) 1 g tablet Take 1 tablet (1 g total) by mouth 4 (four) times daily -  with meals and at bedtime. (Patient not taking: Reported on 04/24/2023) 360 tablet 1   No current facility-administered medications for this visit.    Allergies as of 04/24/2023   (No Known Allergies)    Family History  Problem Relation Age of Onset   Alcohol abuse Mother    Liver disease Mother        alcoholic cirrhosis   Heart disease Father    Alcohol abuse Father    Lung cancer Father    Diabetes Sister    Cirrhosis Brother        secondary to alcohol.    Liver disease Brother    Liver disease Sister        Alcoholic cirrhosis   Diabetes Maternal Aunt    Kidney disease Maternal Aunt    Diabetes Cousin        mat cousin   Breast cancer Neg Hx    Colon cancer Neg Hx    Esophageal cancer Neg Hx    Stomach cancer Neg Hx    Rectal cancer Neg Hx    Pancreatic cancer Neg Hx    Inflammatory bowel disease Neg Hx     Social History   Socioeconomic History   Marital status: Divorced    Spouse name: Not on file   Number of children: 1   Years of education: 12   Highest education level: 12th grade  Occupational History   Occupation:  retired  Tobacco Use   Smoking status: Every Day    Current packs/day: 0.50    Average packs/day: 0.5 packs/day for 40.0 years (20.0 ttl pk-yrs)    Types: Cigarettes    Passive exposure: Current   Smokeless tobacco: Never   Tobacco comments:    Smoking Cessation Offered.  Vaping Use   Vaping status: Never Used  Substance and Sexual Activity   Alcohol use: Not Currently    Alcohol/week: 12.0  standard drinks of alcohol    Types: 12 Cans of beer per week    Comment: 12 cans of beer daily   Drug use: No    Comment: Tried cocaine and marijuana in the 60s.    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    Moved back to [place] from [place]   Social Determinants of Health   Financial Resource Strain: High Risk (07/08/2022)   Overall Financial Resource Strain (CARDIA)    Difficulty of Paying Living Expenses: Hard  Food Insecurity: No Food Insecurity (07/08/2022)   Hunger Vital Sign    Worried About Running Out of Food in the Last Year: Never true    Ran Out of Food in the Last Year: Never true  Transportation Needs: No Transportation Needs (07/08/2022)   PRAPARE - Administrator, Civil Service (Medical): No    Lack of Transportation (Non-Medical): No  Physical Activity: Inactive (07/08/2022)   Exercise Vital Sign    Days of Exercise per Week: 0 days    Minutes of Exercise per Session: 0 min  Stress: Stress Concern Present (07/08/2022)   [employer] of Occupational Health - Occupational Stress Questionnaire    Feeling of Stress : To some extent  Social Connections: Socially Isolated (07/08/2022)   Social Connection and Isolation Panel [NHANES]    Frequency of Communication with Friends and Family: More than three times a week    Frequency of Social Gatherings with Friends and Family: More than three times a week    Attends Religious  Services: Never    Database administrator or Organizations: No    Attends Banker Meetings: Never    Marital Status: Divorced    Subjective: Review of Systems  Constitutional:  Negative for chills and fever.  HENT:  Negative for congestion and hearing loss.   Eyes:  Negative for blurred vision and double vision.  Respiratory:  Negative for cough and shortness of breath.   Cardiovascular:  Negative for chest pain and palpitations.  Gastrointestinal:  Negative for abdominal pain, blood in stool, constipation, diarrhea, heartburn, melena and vomiting.  Genitourinary:  Negative for dysuria and urgency.  Musculoskeletal:  Negative for joint pain and myalgias.  Skin:  Negative for itching and rash.  Neurological:  Negative for dizziness and headaches.  Psychiatric/Behavioral:  Negative for depression. The patient is not nervous/anxious.      Objective: BP (!) 147/68   Pulse 75   Temp 98.1 F (36.7 C) (Oral)   Ht 5' (1.524 m)   Wt 158 lb 4.8 oz (71.8 kg)   BMI 30.92 kg/m  Physical Exam Constitutional:      Appearance: Normal appearance.  HENT:     Head: Normocephalic and atraumatic.  Eyes:     Extraocular Movements: Extraocular movements intact.     Conjunctiva/sclera: Conjunctivae normal.  Cardiovascular:     Rate and Rhythm: Normal rate and regular rhythm.  Pulmonary:     Effort: Pulmonary effort is normal.     Breath sounds: Normal breath sounds.  Abdominal:     General: Bowel sounds are normal.     Palpations: Abdomen is soft.  Musculoskeletal:        General: No swelling. Normal range of motion.     Cervical back: Normal range of motion and neck supple.  Skin:  General: Skin is warm and dry.     Coloration: Skin is not jaundiced.  Neurological:     General: No focal deficit present.     Mental Status: She is alert and oriented to person, place, and time.  Psychiatric:        Mood and Affect: Mood normal.        Behavior: Behavior normal.       Assessment/Plan:  1.  Alcohol cirrhosis-appears well compensated.  MELD 3.0 of 8.  No complications today.  Will order ultrasound for HCC screening.  LFTs have normalized.  Continue alcohol cessation (sober since October 2023)  2.  Duodenal ulcers/chronic GERD-abdominal pain has resolved.  H. pylori stool antigen negative.  Gastrin level WNL.  No complaints today.  Continue to avoid all NSAIDs.  Continue pantoprazole twice daily.    3.  Squamous cell carcinoma of esophagus-due for surveillance EGD, we will schedule today. The risks including infection, bleed, or perforation as well as benefits, limitations, alternatives and imponderables have been reviewed with the patient. Potential for esophageal dilation, biopsy, etc. have also been reviewed.  Questions have been answered. All parties agreeable.  4.  Dysphagia, primarily oropharyngeal, some odynophagia-EGD as above to further evaluate.  May perform esophageal dilation pending findings  5.  Chronic alcohol use-sober since October 23.  Congratulated her on this.  Continue all alcohol cessation.  Follow-up in 6 months.  Will call with results of ultrasound   04/24/2023 11:36 AM   Disclaimer: This note was dictated with voice recognition software. Similar sounding words can inadvertently be transcribed and may not be corrected upon review.

## 2023-04-24 NOTE — Patient Instructions (Signed)
I am going to order ultrasound of your liver.  We will call with results.  We will schedule you for upper endoscopy for surveillance purposes given your history of esophageal cancer.  I may elect to stretch your esophagus depending on findings.  I will give you samples of Linzess 290 mcg daily for your chronic constipation.  Let me know in a week if improved and I can send in formal prescription.  We have room to decrease dose if this dose is too strong.  Linzess works best when taken once a day every day, on an empty stomach, at least 30 minutes before your first meal of the day.  When Linzess is taken daily as directed:  *Constipation relief is typically felt in about a week *IBS-C patients may begin to experience relief from belly pain and overall abdominal symptoms (pain, discomfort, and bloating) in about 1 week,   with symptoms typically improving over 12 weeks.  Diarrhea may occur in the first 2 weeks -keep taking it.  The diarrhea should go away and you should start having normal, complete, full bowel movements. It may be helpful to start treatment when you can be near the comfort of your own bathroom, such as a weekend.   It is always a pleasure seeing you.  Dr. Marletta Lor

## 2023-04-25 ENCOUNTER — Encounter (HOSPITAL_COMMUNITY): Admission: RE | Admit: 2023-04-25 | Payer: Medicare HMO | Source: Ambulatory Visit

## 2023-04-25 ENCOUNTER — Encounter: Payer: Self-pay | Admitting: *Deleted

## 2023-04-25 ENCOUNTER — Telehealth (INDEPENDENT_AMBULATORY_CARE_PROVIDER_SITE_OTHER): Payer: Self-pay | Admitting: Internal Medicine

## 2023-04-25 NOTE — Telephone Encounter (Signed)
Regina Richardson, Regina Richardson DOB 02/15/1961  Member ID [id number]  Health plan [employer]  Service Diagnostic Esophagogastroduodenoscopy (EGD)  Procedure code 82956  Submission date 04/24/2023 12:21 PM  Dates of service 04/24/2023 -- 06/24/2023  Approved Authorization #[id number]  Tracking #[id number]

## 2023-04-25 NOTE — Telephone Encounter (Signed)
[name], [name], LPN; Elsie Amis, RN; Lindajo Royal, RN We don't have any pre ops available for tomorrow.  If she can't come today she will probably need to reschedule her procedure. Patients don't call pre op to reschedule they call me.       Previous Messages    ----- Message ----- From: Marlowe Shores, LPN Sent: 10/25/4096   8:14 AM EDT To: Elsie Amis, RN; Lindajo Royal, RN; *  Contacted pt to let her know of her pre op today at 35. She states "that will be impossible" I can her pre ops number to call and reschedule.   Pt is scheduled for 04/30/23. I called patient-someone answered but didn't say anything.

## 2023-04-25 NOTE — Telephone Encounter (Signed)
[name], Carolyn H  Layni Kreamer S, CMA No, she didn't call me.  Her procedure is Monday and I don't have any open PATs tomorrow.  She will need to reschedule her procedure. ----------------  Pt did not show for pre-op. Called pt, line rings busy

## 2023-04-25 NOTE — Telephone Encounter (Signed)
Tried calling patient again but line just rings busy.

## 2023-04-25 NOTE — Telephone Encounter (Signed)
Spoke with pt. She is aware procedure for Monday has been cancelled. She has been rescheduled to 11/18. Aware I will send her new instructions and call back with pre--op appointment. Message sent to [facility] in endo making aware.   Patient was confused with her appointments. She thought she was supposed to get an [place] also. I advised her of when this was scheduled.

## 2023-04-29 ENCOUNTER — Encounter: Payer: Self-pay | Admitting: *Deleted

## 2023-05-01 ENCOUNTER — Ambulatory Visit (HOSPITAL_COMMUNITY)
Admission: RE | Admit: 2023-05-01 | Discharge: 2023-05-01 | Disposition: A | Discharge status: Home / Self Care | Payer: Medicare HMO | Source: Ambulatory Visit | Attending: Internal Medicine | Admitting: Internal Medicine

## 2023-05-01 DIAGNOSIS — K7030 Alcoholic cirrhosis of liver without ascites: Secondary | ICD-10-CM | POA: Insufficient documentation

## 2023-05-01 DIAGNOSIS — K7460 Unspecified cirrhosis of liver: Secondary | ICD-10-CM | POA: Diagnosis not present

## 2023-05-01 DIAGNOSIS — K8020 Calculus of gallbladder without cholecystitis without obstruction: Secondary | ICD-10-CM | POA: Diagnosis not present

## 2023-05-01 DIAGNOSIS — K7689 Other specified diseases of liver: Secondary | ICD-10-CM | POA: Diagnosis not present

## 2023-05-02 ENCOUNTER — Ambulatory Visit: Payer: Medicare HMO | Admitting: Family Medicine

## 2023-05-06 ENCOUNTER — Encounter: Payer: Self-pay | Admitting: Family Medicine

## 2023-05-09 ENCOUNTER — Ambulatory Visit: Payer: Self-pay | Admitting: *Deleted

## 2023-05-09 NOTE — Patient Outreach (Signed)
Care Coordination   05/09/2023 Name: Regina Richardson MRN: [id number] DOB: August 16, 1960   Care Coordination Outreach Attempts:  An unsuccessful telephone outreach was attempted today to offer the patient information about available care coordination services.  Follow Up Plan:  Additional outreach attempts will be made to offer the patient care coordination information and services.   Encounter Outcome:  No Answer The phone was answered but no one responded for over a minute. Sister, wendy voice mail box is not set up    Care Coordination Interventions:  Yes, provided    [name] L. Noelle Penner, RN, BSN, [facility]  VBCI Care Management Coordinator  [phone]  Fax: [phone]

## 2023-05-21 ENCOUNTER — Ambulatory Visit: Payer: Medicare HMO | Admitting: Professional Counselor

## 2023-05-24 ENCOUNTER — Telehealth: Payer: Self-pay | Admitting: Professional Counselor

## 2023-05-24 NOTE — Telephone Encounter (Signed)
Patient no showed twice and has not followed up with collaborative care. We will sign off.

## 2023-05-28 ENCOUNTER — Ambulatory Visit: Payer: Medicare HMO

## 2023-06-05 NOTE — Patient Instructions (Signed)
Regina Richardson  06/05/2023     @PREFPERIOPPHARMACY @   Your procedure is scheduled on  06/10/2023.   Report to [facility] at  1230  P.M.   Call this number if you have problems the morning of surgery:  [phone]  If you experience any cold or flu symptoms such as cough, fever, chills, shortness of breath, etc. between now and your scheduled surgery, please notify us at the above number.   Remember:       Follow the diet instructions given to you by the office.   You may drink clear liquids until 1030 am on 06/10/2023.    Clear liquids allowed are:                    Water, Juice (No red color; non-citric and without pulp; diabetics please choose diet or no sugar options), Carbonated beverages (diabetics please choose diet or no sugar options), Clear Tea (No creamer, milk, or cream, including half & half and powdered creamer), Black Coffee Only (No creamer, milk or cream, including half & half and powdered creamer), Plain Jell-O Only (No red color; diabetics please choose no sugar options), Clear Sports drink (No red color; diabetics please choose diet or no sugar options), and Plain Popsicles Only (No red color; diabetics please choose no sugar options)    Take these medicines the morning of surgery with A SIP OF WATER          allopurinol, bupropion, duloxetine, methocarbamol(if needed), metoprolol, pantoprazole.     Do not wear jewelry, make-up or nail polish, including gel polish,  artificial nails, or any other type of covering on natural nails (fingers and  toes).  Do not wear lotions, powders, or perfumes, or deodorant.  Do not shave 48 hours prior to surgery.  Men may shave face and neck.  Do not bring valuables to the hospital.  [facility] is not responsible for any belongings or valuables.  Contacts, dentures or bridgework may not be worn into surgery.  Leave your suitcase in the car.  After surgery it may be brought to your room.  For patients admitted  to the hospital, discharge time will be determined by your treatment team.  Patients discharged the day of surgery will not be allowed to drive home and must have someone with them for 24 hours.    Special instructions:   DO NOT smoke tobacco or vape for 24 hours before your procedure.  Please read over the following fact sheets that you were given. Anesthesia Post-op Instructions and Care and Recovery After Surgery       Upper Endoscopy, Adult, Care After After the procedure, it is common to have a sore throat. It is also common to have: Mild stomach pain or discomfort. Bloating. Nausea. Follow these instructions at home: The instructions below may help you care for yourself at home. Your health care provider may give you more instructions. If you have questions, ask your health care provider. If you were given a sedative during the procedure, it can affect you for several hours. Do not drive or operate machinery until your health care provider says that it is safe. If you will be going home right after the procedure, plan to have a responsible adult: Take you home from the hospital or clinic. You will not be allowed to drive. Care for you for the time you are told. Follow instructions from your health care provider about what  you may eat and drink. Return to your normal activities as told by your health care provider. Ask your health care provider what activities are safe for you. Take over-the-counter and prescription medicines only as told by your health care provider. Contact a health care provider if you: Have a sore throat that lasts longer than one day. Have trouble swallowing. Have a fever. Get help right away if you: Vomit blood or your vomit looks like coffee grounds. Have bloody, black, or tarry stools. Have a very bad sore throat or you cannot swallow. Have difficulty breathing or very bad pain in your chest or abdomen. These symptoms may be an emergency. Get help  right away. Call 911. Do not wait to see if the symptoms will go away. Do not drive yourself to the hospital. Summary After the procedure, it is common to have a sore throat, mild stomach discomfort, bloating, and nausea. If you were given a sedative during the procedure, it can affect you for several hours. Do not drive until your health care provider says that it is safe. Follow instructions from your health care provider about what you may eat and drink. Return to your normal activities as told by your health care provider. This information is not intended to replace advice given to you by your health care provider. Make sure you discuss any questions you have with your health care provider. Document Revised: 10/18/2021 Document Reviewed: 10/18/2021 Elsevier Patient Education  2024 [employer] Esophageal Dilatation Esophageal dilatation, also called esophageal dilation, is a procedure to widen or open a blocked or narrowed part of the esophagus. The esophagus is the part of the body that moves food and liquid from the mouth to the stomach. You may need this procedure if: You have a buildup of scar tissue in your esophagus that makes it difficult, painful, or impossible to swallow. This can be caused by gastroesophageal reflux disease (GERD). You have cancer of the esophagus. There is a problem with how food moves through your esophagus. In some cases, you may need this procedure repeated at a later time to dilate the esophagus gradually. Tell a health care provider about: Any allergies you have. All medicines you are taking, including vitamins, herbs, eye drops, creams, and over-the-counter medicines. Any problems you or family members have had with anesthetic medicines. Any blood disorders you have. Any surgeries you have had. Any medical conditions you have. Any antibiotic medicines you are required to take before dental procedures. Whether you are pregnant or may be pregnant. What  are the risks? Generally, this is a safe procedure. However, problems may occur, including: Bleeding due to a tear in the lining of the esophagus. A hole, or perforation, in the esophagus. What happens before the procedure? Ask your health care provider about: Changing or stopping your regular medicines. This is especially important if you are taking diabetes medicines or blood thinners. Taking medicines such as aspirin and ibuprofen. These medicines can thin your blood. Do not take these medicines unless your health care provider tells you to take them. Taking over-the-counter medicines, vitamins, herbs, and supplements. Follow instructions from your health care provider about eating or drinking restrictions. Plan to have a responsible adult take you home from the hospital or clinic. Plan to have a responsible adult care for you for the time you are told after you leave the hospital or clinic. This is important. What happens during the procedure? You may be given a medicine to help you relax (sedative). A  numbing medicine may be sprayed into the back of your throat, or you may gargle the medicine. Your health care provider may perform the dilatation using various surgical instruments, such as: Simple dilators. This instrument is carefully placed in the esophagus to stretch it. Guided wire bougies. This involves using an endoscope to insert a wire into the esophagus. A dilator is passed over this wire to enlarge the esophagus. Then the wire is removed. Balloon dilators. An endoscope with a small balloon is inserted into the esophagus. The balloon is inflated to stretch the esophagus and open it up. The procedure may vary among health care providers and hospitals. What can I expect after the procedure? Your blood pressure, heart rate, breathing rate, and blood oxygen level will be monitored until you leave the hospital or clinic. Your throat may feel slightly sore and numb. This will get better  over time. You will not be allowed to eat or drink until your throat is no longer numb. When you are able to drink, urinate, and sit on the edge of the bed without nausea or dizziness, you may be able to return home. Follow these instructions at home: Take over-the-counter and prescription medicines only as told by your health care provider. If you were given a sedative during the procedure, it can affect you for several hours. Do not drive or operate machinery until your health care provider says that it is safe. Plan to have a responsible adult care for you for the time you are told. This is important. Follow instructions from your health care provider about any eating or drinking restrictions. Do not use any products that contain nicotine or tobacco, such as cigarettes, e-cigarettes, and chewing tobacco. If you need help quitting, ask your health care provider. Keep all follow-up visits. This is important. Contact a health care provider if: You have a fever. You have pain that is not relieved by medicine. Get help right away if: You have chest pain. You have trouble breathing. You have trouble swallowing. You vomit blood. You have black, tarry, or bloody stools. These symptoms may represent a serious problem that is an emergency. Do not wait to see if the symptoms will go away. Get medical help right away. Call your local emergency services (911 in the [place] Do not drive yourself to the hospital. Summary Esophageal dilatation, also called esophageal dilation, is a procedure to widen or open a blocked or narrowed part of the esophagus. Plan to have a responsible adult take you home from the hospital or clinic. For this procedure, a numbing medicine may be sprayed into the back of your throat, or you may gargle the medicine. Do not drive or operate machinery until your health care provider says that it is safe. This information is not intended to replace advice given to you by your health  care provider. Make sure you discuss any questions you have with your health care provider. Document Revised: 11/25/2019 Document Reviewed: 11/25/2019 Elsevier Patient Education  2024 Else[employer] Inc. Monitored Anesthesia Care, Care After The following information offers guidance on how to care for yourself after your procedure. Your health care provider may also give you more specific instructions. If you have problems or questions, contact your health care provider. What can I expect after the procedure? After the procedure, it is common to have: Tiredness. Little or no memory about what happened during or after the procedure. Impaired judgment when it comes to making decisions. Nausea or vomiting. Some trouble with balance. Follow  these instructions at home: For the time period you were told by your health care provider:  Rest. Do not participate in activities where you could fall or become injured. Do not drive or use machinery. Do not drink alcohol. Do not take sleeping pills or medicines that cause drowsiness. Do not make important decisions or sign legal documents. Do not take care of children on your own. Medicines Take over-the-counter and prescription medicines only as told by your health care provider. If you were prescribed antibiotics, take them as told by your health care provider. Do not stop using the antibiotic even if you start to feel better. Eating and drinking Follow instructions from your health care provider about what you may eat and drink. Drink enough fluid to keep your urine pale yellow. If you vomit: Drink clear fluids slowly and in small amounts as you are able. Clear fluids include water, ice chips, low-calorie sports drinks, and fruit juice that has water added to it (diluted fruit juice). Eat light and bland foods in small amounts as you are able. These foods include bananas, applesauce, rice, lean meats, toast, and crackers. General instructions  Have a  responsible adult stay with you for the time you are told. It is important to have someone help care for you until you are awake and alert. If you have sleep apnea, surgery and some medicines can increase your risk for breathing problems. Follow instructions from your health care provider about wearing your sleep device: When you are sleeping. This includes during daytime naps. While taking prescription pain medicines, sleeping medicines, or medicines that make you drowsy. Do not use any products that contain nicotine or tobacco. These products include cigarettes, chewing tobacco, and vaping devices, such as e-cigarettes. If you need help quitting, ask your health care provider. Contact a health care provider if: You feel nauseous or vomit every time you eat or drink. You feel light-headed. You are still sleepy or having trouble with balance after 24 hours. You get a rash. You have a fever. You have redness or swelling around the IV site. Get help right away if: You have trouble breathing. You have new confusion after you get home. These symptoms may be an emergency. Get help right away. Call 911. Do not wait to see if the symptoms will go away. Do not drive yourself to the hospital. This information is not intended to replace advice given to you by your health care provider. Make sure you discuss any questions you have with your health care provider. Document Revised: 12/04/2021 Document Reviewed: 12/04/2021 Elsevier Patient Education  2024 [employer].

## 2023-06-06 ENCOUNTER — Encounter (HOSPITAL_COMMUNITY): Payer: Self-pay

## 2023-06-06 ENCOUNTER — Encounter (HOSPITAL_COMMUNITY)
Admission: RE | Admit: 2023-06-06 | Discharge: 2023-06-06 | Disposition: A | Discharge status: Home / Self Care | Payer: Medicare HMO | Source: Ambulatory Visit | Attending: Internal Medicine | Admitting: Internal Medicine

## 2023-06-06 ENCOUNTER — Telehealth: Payer: Self-pay | Admitting: *Deleted

## 2023-06-06 DIAGNOSIS — E669 Obesity, unspecified: Secondary | ICD-10-CM

## 2023-06-06 DIAGNOSIS — F1010 Alcohol abuse, uncomplicated: Secondary | ICD-10-CM

## 2023-06-06 DIAGNOSIS — I10 Essential (primary) hypertension: Secondary | ICD-10-CM

## 2023-06-06 DIAGNOSIS — K7030 Alcoholic cirrhosis of liver without ascites: Secondary | ICD-10-CM

## 2023-06-06 NOTE — Telephone Encounter (Signed)
-----   Message from Nurse Marinell Blight sent at 06/06/2023  4:08 PM EST ----- Regarding: no show Good afternoon! Dewain Penning did not show for her pre-op this afternoon. Thank you!

## 2023-06-06 NOTE — Telephone Encounter (Signed)
Called pt, no answer and line rang numerous times no answer.

## 2023-06-07 ENCOUNTER — Encounter: Payer: Self-pay | Admitting: *Deleted

## 2023-06-07 NOTE — Telephone Encounter (Signed)
Pt called and stated she was sorry that she missed her pre-op yesterday. She has been rescheduled for 07/15/23. Updated instructions mailed along with pre-op date.

## 2023-06-10 ENCOUNTER — Encounter: Payer: Self-pay | Admitting: Family Medicine

## 2023-06-12 ENCOUNTER — Ambulatory Visit: Payer: Medicare HMO

## 2023-06-18 ENCOUNTER — Ambulatory Visit: Payer: Medicare HMO | Admitting: Family Medicine

## 2023-07-02 DIAGNOSIS — S5012XA Contusion of left forearm, initial encounter: Secondary | ICD-10-CM | POA: Diagnosis not present

## 2023-07-02 DIAGNOSIS — M25472 Effusion, left ankle: Secondary | ICD-10-CM | POA: Diagnosis not present

## 2023-07-02 DIAGNOSIS — M19072 Primary osteoarthritis, left ankle and foot: Secondary | ICD-10-CM | POA: Diagnosis not present

## 2023-07-02 DIAGNOSIS — M7732 Calcaneal spur, left foot: Secondary | ICD-10-CM | POA: Diagnosis not present

## 2023-07-02 DIAGNOSIS — M79672 Pain in left foot: Secondary | ICD-10-CM | POA: Diagnosis not present

## 2023-07-02 DIAGNOSIS — M25572 Pain in left ankle and joints of left foot: Secondary | ICD-10-CM | POA: Diagnosis not present

## 2023-07-10 NOTE — Patient Instructions (Signed)
20    Your procedure is scheduled on: 07/15/2023  Report to [facility] Main Entrance at    6:00 AM.  Call this number if you have problems the morning of surgery: [phone]   Remember:   Follow instructions on letter from office regarding when to stop eating and drinking        No Smoking the day of procedure      Take these medicines the morning of surgery with A SIP OF WATER: Wellbutrin, metoprolol, and pantoprazole   Do not wear jewelry, make-up or nail polish.  Do not wear lotions, powders, or perfumes. You may wear deodorant.                Do not bring valuables to the hospital.  Contacts, dentures or bridgework may not be worn into surgery.  Leave suitcase in the car. After surgery it may be brought to your room.  For patients admitted to the hospital, checkout time is 11:00 AM the day of discharge.   Patients discharged the day of surgery will not be allowed to drive home. Upper Endoscopy, Adult Upper endoscopy is a procedure to look inside the upper GI (gastrointestinal) tract. The upper GI tract is made up of: The part of the body that moves food from your mouth to your stomach (esophagus). The stomach. The first part of your small intestine (duodenum). This procedure is also called esophagogastroduodenoscopy (EGD) or gastroscopy. In this procedure, your health care provider passes a thin, flexible tube (endoscope) through your mouth and down your esophagus into your stomach. A small camera is attached to the end of the tube. Images from the camera appear on a monitor in the exam room. During this procedure, your health care provider may also remove a small piece of tissue to be sent to a lab and examined under a microscope (biopsy). Your health care provider may do an upper endoscopy to diagnose cancers of the upper GI tract. You may also have this procedure to find the cause of other conditions, such as: Stomach pain. Heartburn. Pain or problems when swallowing. Nausea and  vomiting. Stomach bleeding. Stomach ulcers. Tell a health care provider about: Any allergies you have. All medicines you are taking, including vitamins, herbs, eye drops, creams, and over-the-counter medicines. Any problems you or family members have had with anesthetic medicines. Any blood disorders you have. Any surgeries you have had. Any medical conditions you have. Whether you are pregnant or may be pregnant. What are the risks? Generally, this is a safe procedure. However, problems may occur, including: Infection. Bleeding. Allergic reactions to medicines. A tear or hole (perforation) in the esophagus, stomach, or duodenum. What happens before the procedure? Staying hydrated Follow instructions from your health care provider about hydration, which may include: Up to 4 hours before the procedure - you may continue to drink clear liquids, such as water, clear fruit juice, black coffee, and plain tea.   Medicines Ask your health care provider about: Changing or stopping your regular medicines. This is especially important if you are taking diabetes medicines or blood thinners. Taking medicines such as aspirin and ibuprofen. These medicines can thin your blood. Do not take these medicines unless your health care provider tells you to take them. Taking over-the-counter medicines, vitamins, herbs, and supplements. General instructions Plan to have someone take you home from the hospital or clinic. If you will be going home right after the procedure, plan to have someone with you for 24 hours. Ask  your health care provider what steps will be taken to help prevent infection. What happens during the procedure?  An IV will be inserted into one of your veins. You may be given one or more of the following: A medicine to help you relax (sedative). A medicine to numb the throat (local anesthetic). You will lie on your left side on an exam table. Your health care provider will pass the  endoscope through your mouth and down your esophagus. Your health care provider will use the scope to check the inside of your esophagus, stomach, and duodenum. Biopsies may be taken. The endoscope will be removed. The procedure may vary among health care providers and hospitals. What happens after the procedure? Your blood pressure, heart rate, breathing rate, and blood oxygen level will be monitored until you leave the hospital or clinic. Do not drive for 24 hours if you were given a sedative during your procedure. When your throat is no longer numb, you may be given some fluids to drink. It is up to you to get the results of your procedure. Ask your health care provider, or the department that is doing the procedure, when your results will be ready. Summary Upper endoscopy is a procedure to look inside the upper GI tract. During the procedure, an IV will be inserted into one of your veins. You may be given a medicine to help you relax. A medicine will be used to numb your throat. The endoscope will be passed through your mouth and down your esophagus. This information is not intended to replace advice given to you by your health care provider. Make sure you discuss any questions you have with your health care provider. Document Revised: 01/01/2018 Document Reviewed: 12/09/2017 Elsevier Patient Education  2020 Elsevier Inc.                                                                                                                                      EndoscopyCare After  Please read the instructions outlined below and refer to this sheet in the next few weeks. These discharge instructions provide you with general information on caring for yourself after you leave the hospital. Your doctor may also give you specific instructions. While your treatment has been planned according to the most current medical practices available, unavoidable complications occasionally occur. If you have any  problems or questions after discharge, please call your doctor. HOME CARE INSTRUCTIONS Activity You may resume your regular activity but move at a slower pace for the next 24 hours.  Take frequent rest periods for the next 24 hours.  Walking will help expel (get rid of) the air and reduce the bloated feeling in your abdomen.  No driving for 24 hours (because of the anesthesia (medicine) used during the test).  You may shower.  Do not sign any important legal documents or operate any machinery for 24 hours (because of the anesthesia  used during the test).  Nutrition Drink plenty of fluids.  You may resume your normal diet.  Begin with a light meal and progress to your normal diet.  Avoid alcoholic beverages for 24 hours or as instructed by your caregiver.  Medications You may resume your normal medications unless your caregiver tells you otherwise. What you can expect today You may experience abdominal discomfort such as a feeling of fullness or "gas" pains.  You may experience a sore throat for 2 to 3 days. This is normal. Gargling with salt water may help this.  Follow-up Your doctor will discuss the results of your test with you. SEEK IMMEDIATE MEDICAL CARE IF: You have excessive nausea (feeling sick to your stomach) and/or vomiting.  You have severe abdominal pain and distention (swelling).  You have trouble swallowing.  You have a temperature over 100 F (37.8 C).  You have rectal bleeding or vomiting of blood.  Document Released: 02/21/2004 Document Revised: 06/28/2011 Document Reviewed: 09/03/2007

## 2023-07-11 ENCOUNTER — Encounter (HOSPITAL_COMMUNITY)
Admission: RE | Admit: 2023-07-11 | Discharge: 2023-07-11 | Disposition: A | Discharge status: Home / Self Care | Payer: Medicare HMO | Source: Ambulatory Visit | Attending: Internal Medicine | Admitting: Internal Medicine

## 2023-07-11 ENCOUNTER — Telehealth: Payer: Self-pay | Admitting: *Deleted

## 2023-07-11 ENCOUNTER — Other Ambulatory Visit: Payer: Self-pay | Admitting: Internal Medicine

## 2023-07-11 VITALS — HR 71 | Temp 97.7°F | Resp 18 | Ht 60.0 in | Wt 158.3 lb

## 2023-07-11 DIAGNOSIS — E669 Obesity, unspecified: Secondary | ICD-10-CM

## 2023-07-11 DIAGNOSIS — K7030 Alcoholic cirrhosis of liver without ascites: Secondary | ICD-10-CM

## 2023-07-11 DIAGNOSIS — Z6830 Body mass index (BMI) 30.0-30.9, adult: Secondary | ICD-10-CM | POA: Insufficient documentation

## 2023-07-11 DIAGNOSIS — Z01818 Encounter for other preprocedural examination: Secondary | ICD-10-CM | POA: Insufficient documentation

## 2023-07-11 DIAGNOSIS — F1010 Alcohol abuse, uncomplicated: Secondary | ICD-10-CM

## 2023-07-11 DIAGNOSIS — I10 Essential (primary) hypertension: Secondary | ICD-10-CM

## 2023-07-11 DIAGNOSIS — E876 Hypokalemia: Secondary | ICD-10-CM | POA: Diagnosis not present

## 2023-07-11 LAB — COMPREHENSIVE METABOLIC PANEL
ALT: 13 U/L (ref 0–44)
AST: 17 U/L (ref 15–41)
Albumin: 3.7 g/dL (ref 3.5–5.0)
Alkaline Phosphatase: 82 U/L (ref 38–126)
Anion gap: 10 (ref 5–15)
BUN: 7 mg/dL — ABNORMAL LOW (ref 8–23)
CO2: 26 mmol/L (ref 22–32)
Calcium: 8.7 mg/dL — ABNORMAL LOW (ref 8.9–10.3)
Chloride: 101 mmol/L (ref 98–111)
Creatinine, Ser: 0.67 mg/dL (ref 0.44–1.00)
GFR, Estimated: >60 mL/min (ref >60)
Glucose, Bld: 116 mg/dL — ABNORMAL HIGH (ref 70–99)
Potassium: 2.7 mmol/L — CL (ref 3.5–5.1)
Sodium: 137 mmol/L (ref 135–145)
Total Bilirubin: 0.5 mg/dL (ref <1.2)
Total Protein: 7.5 g/dL (ref 6.5–8.1)

## 2023-07-11 LAB — CBC WITH DIFFERENTIAL/PLATELET
Abs Immature Granulocytes: 0.02 10*3/uL (ref 0.00–0.07)
Basophils Absolute: 0.1 10*3/uL (ref 0.0–0.1)
Basophils Relative: 2 %
Eosinophils Absolute: 0.1 10*3/uL (ref 0.0–0.5)
Eosinophils Relative: 1 %
HCT: 27.1 % — ABNORMAL LOW (ref 36.0–46.0)
Hemoglobin: 7.8 g/dL — ABNORMAL LOW (ref 12.0–15.0)
Immature Granulocytes: 0 %
Lymphocytes Relative: 23 %
Lymphs Abs: 1.7 10*3/uL (ref 0.7–4.0)
MCH: 20.7 pg — ABNORMAL LOW (ref 26.0–34.0)
MCHC: 28.8 g/dL — ABNORMAL LOW (ref 30.0–36.0)
MCV: 71.9 fL — ABNORMAL LOW (ref 80.0–100.0)
Monocytes Absolute: 0.6 10*3/uL (ref 0.1–1.0)
Monocytes Relative: 8 %
Neutro Abs: 5 10*3/uL (ref 1.7–7.7)
Neutrophils Relative %: 66 %
Platelets: 408 10*3/uL — ABNORMAL HIGH (ref 150–400)
RBC: 3.77 MIL/uL — ABNORMAL LOW (ref 3.87–5.11)
RDW: 18.3 % — ABNORMAL HIGH (ref 11.5–15.5)
WBC: 7.6 10*3/uL (ref 4.0–10.5)
nRBC: 0 % (ref 0.0–0.2)

## 2023-07-11 LAB — PROTIME-INR
INR: 1.1 (ref 0.8–1.2)
Prothrombin Time: 14.5 s (ref 11.4–15.2)

## 2023-07-11 MED ORDER — POTASSIUM CHLORIDE CRYS ER 20 MEQ PO TBCR: 20.0000 meq | EXTENDED_RELEASE_TABLET | Freq: Two times a day (BID) | ORAL | 0 refills | Status: DC

## 2023-07-11 NOTE — Telephone Encounter (Signed)
Spoke with pt and she is aware of recommendations. She voiced understanding and aware rx sent in.

## 2023-07-11 NOTE — Telephone Encounter (Signed)
Per Dr. Marletta Lor "Potassium 20 meq twice daily x 4 days sent to patient's pharmacy. Please let her know."  Called pt, no answer and no VM

## 2023-07-11 NOTE — Telephone Encounter (Signed)
-----   Message from Nurse Evlyn Kanner sent at 07/11/2023 10:05 AM EST ----- Regarding: Ms Halterman's K+ is 2.7,  She is for EGD/ED 07/15/2023 Duwaine Maxin,  Ms Harrower's K+ is 2.7,  She is for EGD/ED on 07/15/2023  Thanks,  Cliffton Asters RN

## 2023-07-15 ENCOUNTER — Ambulatory Visit (HOSPITAL_COMMUNITY)
Admission: RE | Admit: 2023-07-15 | Discharge: 2023-07-15 | Disposition: A | Discharge status: Home / Self Care | Payer: Medicare HMO | Attending: Internal Medicine | Admitting: Internal Medicine

## 2023-07-15 ENCOUNTER — Ambulatory Visit (HOSPITAL_BASED_OUTPATIENT_CLINIC_OR_DEPARTMENT_OTHER): Payer: Medicare HMO | Admitting: Anesthesiology

## 2023-07-15 ENCOUNTER — Encounter (HOSPITAL_COMMUNITY): Payer: Self-pay

## 2023-07-15 ENCOUNTER — Ambulatory Visit (HOSPITAL_COMMUNITY): Payer: Self-pay | Admitting: Anesthesiology

## 2023-07-15 ENCOUNTER — Encounter (HOSPITAL_COMMUNITY)
Admission: RE | Disposition: A | Discharge status: Home / Self Care | Payer: Self-pay | Source: Home / Self Care | Attending: Internal Medicine

## 2023-07-15 DIAGNOSIS — I10 Essential (primary) hypertension: Secondary | ICD-10-CM | POA: Diagnosis not present

## 2023-07-15 DIAGNOSIS — Z5986 Financial insecurity: Secondary | ICD-10-CM | POA: Diagnosis not present

## 2023-07-15 DIAGNOSIS — F419 Anxiety disorder, unspecified: Secondary | ICD-10-CM | POA: Diagnosis not present

## 2023-07-15 DIAGNOSIS — R1310 Dysphagia, unspecified: Secondary | ICD-10-CM | POA: Diagnosis not present

## 2023-07-15 DIAGNOSIS — Z8501 Personal history of malignant neoplasm of esophagus: Secondary | ICD-10-CM | POA: Diagnosis not present

## 2023-07-15 DIAGNOSIS — F32A Depression, unspecified: Secondary | ICD-10-CM | POA: Insufficient documentation

## 2023-07-15 DIAGNOSIS — K2289 Other specified disease of esophagus: Secondary | ICD-10-CM | POA: Insufficient documentation

## 2023-07-15 DIAGNOSIS — Z08 Encounter for follow-up examination after completed treatment for malignant neoplasm: Secondary | ICD-10-CM | POA: Diagnosis not present

## 2023-07-15 DIAGNOSIS — Z9889 Other specified postprocedural states: Secondary | ICD-10-CM | POA: Diagnosis not present

## 2023-07-15 DIAGNOSIS — I499 Cardiac arrhythmia, unspecified: Secondary | ICD-10-CM | POA: Diagnosis not present

## 2023-07-15 DIAGNOSIS — F17210 Nicotine dependence, cigarettes, uncomplicated: Secondary | ICD-10-CM | POA: Diagnosis not present

## 2023-07-15 DIAGNOSIS — Z8711 Personal history of peptic ulcer disease: Secondary | ICD-10-CM | POA: Insufficient documentation

## 2023-07-15 DIAGNOSIS — E876 Hypokalemia: Secondary | ICD-10-CM

## 2023-07-15 LAB — POCT I-STAT, CHEM 8
BUN: 7 mg/dL — ABNORMAL LOW (ref 8–23)
Calcium, Ion: 1.12 mmol/L — ABNORMAL LOW (ref 1.15–1.40)
Chloride: 103 mmol/L (ref 98–111)
Creatinine, Ser: 0.7 mg/dL (ref 0.44–1.00)
Glucose, Bld: 103 mg/dL — ABNORMAL HIGH (ref 70–99)
HCT: 27 % — ABNORMAL LOW (ref 36.0–46.0)
Hemoglobin: 9.2 g/dL — ABNORMAL LOW (ref 12.0–15.0)
Potassium: 3.1 mmol/L — ABNORMAL LOW (ref 3.5–5.1)
Sodium: 141 mmol/L (ref 135–145)
TCO2: 23 mmol/L (ref 22–32)

## 2023-07-15 SURGERY — ESOPHAGOGASTRODUODENOSCOPY (EGD) WITH PROPOFOL
Anesthesia: General

## 2023-07-15 MED ORDER — PROPOFOL 500 MG/50ML IV EMUL
INTRAVENOUS | Status: DC | PRN
  Administered 2023-07-15: 80 mg via INTRAVENOUS
  Administered 2023-07-15: 25 mg via INTRAVENOUS
  Administered 2023-07-15: 20 mg via INTRAVENOUS
  Administered 2023-07-15: 25 mg via INTRAVENOUS

## 2023-07-15 MED ORDER — LIDOCAINE HCL (CARDIAC) PF 100 MG/5ML IV SOSY
PREFILLED_SYRINGE | INTRAVENOUS | Status: DC | PRN
  Administered 2023-07-15: 80 mg via INTRATRACHEAL

## 2023-07-15 MED ORDER — LACTATED RINGERS IV SOLN: INTRAVENOUS | Status: DC

## 2023-07-15 MED ORDER — PROPOFOL 500 MG/50ML IV EMUL: INTRAVENOUS | Status: DC | PRN

## 2023-07-15 NOTE — H&P (Signed)
Primary Care Physician:  Mechele Claude, MD Primary Gastroenterologist:  Dr. Marletta Lor  Pre-Procedure History & Physical: HPI:  Regina Richardson is a 62 y.o. female is here  for an EGD to be performed for surveillance, personal history of SCC of esophagus s/p submucosal resection.   Past Medical History:  Diagnosis Date   Alcoholism (HCC)    Anxiety    Apnea 06/11/2021   Arrhythmia    heart   Arthritis    Cancer (HCC)    cervical cancer in the 80s   Cirrhosis (HCC) 04/2020   alcoholic;    Depression    Dysrhythmia    GERD (gastroesophageal reflux disease)    Hypertension    Osteoporosis    Scoliosis    Urinary incontinence     Past Surgical History:  Procedure Laterality Date   BIOPSY  10/11/2020   Procedure: BIOPSY;  Surgeon: Lanelle Bal, DO;  [facility];  Service: Endoscopy;;   BIOPSY  12/12/2020   Procedure: BIOPSY;  Surgeon: Lemar Lofty., MD;  Location: [facility];  Service: Gastroenterology;;   BIOPSY  04/27/2022   Procedure: BIOPSY;  Surgeon: Marguerita Merles, Reuel Boom, MD;  [facility]: [facility];  Service: Gastroenterology;;   CERVICAL BIOPSY  W/ LOOP ELECTRODE EXCISION     CESAREAN SECTION     COLONOSCOPY  08/2017   Dr. Russella Dar;  prominent and moderately lipomatous ileocecal valve, 9 mm sessile polyp in hepatic flexure, 8 mm sessile polyp in the descending colon, otherwise normal exam.  Pathology with sessile serrated polyp and hyperplastic polyp.  Recommended repeat colonoscopy in 5 years.   COLONOSCOPY N/A 06/15/2020   Procedure: COLONOSCOPY;  Surgeon: Malissa Hippo, MD; 2 polyps resected and retrieved, 1 small polyp in the distal sigmoid biopsy, external hemorrhoids.  Pathology with 1 tubular adenoma and 1 hyperplastic polyp.    COLPOSCOPY     EGD with mucosal resection  03/16/2021   [facility]: 2 large plaques in the proximal-mid third of the esophagus s/p endoscopic submucosal resection and retrieval, placement of 4 MR conditional clips.   Normal stomach, normal examined duodenum.  Pathology revealed squamous cell carcinoma in situ focally extending to the peripheral inked margin.  No invasive carcinoma. Recommended repeat EGD in 3 weeks.   ESOPHAGEAL DILATION N/A 06/15/2020   Procedure: ESOPHAGEAL DILATION;  Surgeon: Malissa Hippo, MD;  [facility];  Service: Endoscopy;  Laterality: N/A;   ESOPHAGOGASTRODUODENOSCOPY  08/2017   Dr. Russella Dar; LA grade B esophagitis without bleeding s/p biopsied, diffuse moderate inflammation and granularity in the entire examined stomach s/p biopsied, normal examined duodenum s/p biopsy. Gastric biopsy with chronic inactive gastritis without H. pylori, esophageal biopsy with mildly inflamed squamous mucosa, duodenal biopsy benign.   ESOPHAGOGASTRODUODENOSCOPY N/A 06/15/2020   Procedure: ESOPHAGOGASTRODUODENOSCOPY (EGD);  Surgeon: Malissa Hippo, MD;  Proximal esophageal web, focal esophagitis at proximal esophagus s/p biopsy, benign-appearing esophageal stenosis s/p dilated, portal hypertensive gastropathy.  Esophageal biopsy with moderate squamous dysplasia.     ESOPHAGOGASTRODUODENOSCOPY (EGD) WITH PROPOFOL N/A 10/11/2020   Surgeon: Lanelle Bal, DO; Eroded, inflamed mucosa in the esophagus. Biopsied. Portal hypertensive gastropathy.  Recommended PPI twice daily.  Pathology with moderate to severe squamous dysplasia.   ESOPHAGOGASTRODUODENOSCOPY (EGD) WITH PROPOFOL N/A 12/12/2020   Surgeon: Lemar Lofty., MD; Arnoldo Lenis, granular, mucosa in the esophagus at 23-26 cm.  Lesion 3 cm in length and encompassed 30-40% of esophageal wall s/p quick biopsy.  Erythematous mucosa in the stomach biopsied, portal hypertensive gastropathy.  Gastric biopsy with mild chronic inflammation, negative for H. pylori.  Esophageal biopsy with moderate to severe squamous dysplasia.   ESOPHAGOGASTRODUODENOSCOPY (EGD) WITH PROPOFOL N/A 04/27/2022   Procedure: ESOPHAGOGASTRODUODENOSCOPY (EGD) WITH PROPOFOL;   Surgeon: Dolores Frame, MD;  [facility];  Service: Gastroenterology;  Laterality: N/A;   FINGER SURGERY     HEMORRHOID SURGERY     left collar bone surgery     had fx, pin placed and then complitcations caused them to remove most of the bone   left forearm surgery     from left elbow down   TOTAL ABDOMINAL HYSTERECTOMY     still has ovaries   UPPER ESOPHAGEAL ENDOSCOPIC ULTRASOUND (EUS) N/A 12/12/2020   Surgeon: Lemar Lofty., MD;without esophageal wall thickening, 1 lymph node in the middle paraesophageal mediastinum that was suggestive of benign inflammatory changes, pancreatic lobularity in pancreatic body, queried possibility of chronic pancreatitis though complete EUS not performed and no history of pancreatitis.    Prior to Admission medications   Medication Sig Start Date End Date Taking? Authorizing Provider  allopurinol (ZYLOPRIM) 300 MG tablet Take 1 tablet (300 mg total) by mouth daily. 01/29/23  Yes Stacks, Broadus John, MD  buPROPion (WELLBUTRIN XL) 150 MG 24 hr tablet TAKE 3 TABLETS BY MOUTH DAILY. 03/13/23  Yes Mechele Claude, MD  DULoxetine (CYMBALTA) 60 MG capsule Take 2 capsules (120 mg total) by mouth daily. 01/29/23  Yes Stacks, Broadus John, MD  folic acid (FOLVITE) 1 MG tablet Take 1 tablet (1 mg total) by mouth daily. 01/29/23  Yes Mechele Claude, MD  methocarbamol (ROBAXIN) 500 MG tablet Take 1 tablet (500 mg total) by mouth every 8 (eight) hours as needed for muscle spasms. 09/24/22  Yes Mechele Claude, MD  metoprolol succinate (TOPROL-XL) 100 MG 24 hr tablet Take 1 tablet (100 mg total) by mouth daily. 01/29/23  Yes Mechele Claude, MD  pantoprazole (PROTONIX) 40 MG tablet Take 1 tablet (40 mg total) by mouth 2 (two) times daily. 01/29/23 01/29/24 Yes Stacks, Broadus John, MD  potassium chloride SA (KLOR-CON M) 20 MEQ tablet Take 1 tablet (20 mEq total) by mouth 2 (two) times daily for 4 days. 07/11/23 07/15/23 Yes Caydon Feasel K, DO  QUEtiapine (SEROQUEL) 100 MG  tablet TAKE 1 TABLET BY MOUTH EVERYDAY AT BEDTIME 01/29/23  Yes Stacks, Broadus John, MD  rosuvastatin (CRESTOR) 20 MG tablet Take 1 tablet (20 mg total) by mouth daily. 01/29/23  Yes Stacks, Broadus John, MD  budesonide-formoterol (SYMBICORT) 160-4.5 MCG/ACT inhaler Inhale 2 puffs into the lungs 2 (two) times daily. Patient not taking: Reported on 04/24/2023 02/27/23   Mechele Claude, MD  diclofenac (VOLTAREN) 75 MG EC tablet Take 1 tablet (75 mg total) by mouth 2 (two) times daily. Patient not taking: Reported on 04/24/2023 01/29/23   Mechele Claude, MD  hydrOXYzine (VISTARIL) 25 MG capsule TAKE 1 CAPSULE (25 MG TOTAL) BY MOUTH EVERY 8 (EIGHT) HOURS AS NEEDED. FOR DIZINESS AND ANXIETY Patient not taking: Reported on 04/24/2023 12/04/22   Mechele Claude, MD  ondansetron (ZOFRAN-ODT) 4 MG disintegrating tablet Take 1 tablet (4 mg total) by mouth every 8 (eight) hours as needed for nausea or vomiting. Patient not taking: Reported on 04/24/2023 04/10/22   Gwenlyn Fudge, FNP  sucralfate (CARAFATE) 1 g tablet Take 1 tablet (1 g total) by mouth 4 (four) times daily -  with meals and at bedtime. Patient not taking: Reported on 04/24/2023 01/29/23   Mechele Claude, MD    Allergies as of 04/24/2023   (  No Known Allergies)    Family History  Problem Relation Age of Onset   Alcohol abuse Mother    Liver disease Mother        alcoholic cirrhosis   Heart disease Father    Alcohol abuse Father    Lung cancer Father    Diabetes Sister    Cirrhosis Brother        secondary to alcohol.    Liver disease Brother    Liver disease Sister        Alcoholic cirrhosis   Diabetes Maternal Aunt    Kidney disease Maternal Aunt    Diabetes Cousin        mat cousin   Breast cancer Neg Hx    Colon cancer Neg Hx    Esophageal cancer Neg Hx    Stomach cancer Neg Hx    Rectal cancer Neg Hx    Pancreatic cancer Neg Hx    Inflammatory bowel disease Neg Hx     Social History   Socioeconomic History   Marital status: Divorced     Spouse name: Not on file   Number of children: 1   Years of education: 12   Highest education level: 12th grade  Occupational History   Occupation: retired  Tobacco Use   Smoking status: Every Day    Current packs/day: 0.50    Average packs/day: 0.5 packs/day for 40.0 years (20.0 ttl pk-yrs)    Types: Cigarettes    Passive exposure: Current   Smokeless tobacco: Never   Tobacco comments:    Smoking Cessation Offered.  Vaping Use   Vaping status: Never Used  Substance and Sexual Activity   Alcohol use: Not Currently    Alcohol/week: 12.0 standard drinks of alcohol    Types: 12 Cans of beer per week    Comment: 12 cans of beer daily   Drug use: No    Comment: Tried cocaine and marijuana in the 60s.    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    Moved back to [place] from [place]   Social Drivers of Health   Financial Resource Strain: High Risk (07/08/2022)   Overall Financial Resource Strain (CARDIA)    Difficulty of Paying Living Expenses: Hard  Food Insecurity: No Food Insecurity (07/08/2022)   Hunger Vital Sign    Worried About Running Out of Food in the Last Year: Never true    Ran Out of Food in the Last Year: Never true  Transportation Needs: No Transportation Needs (07/08/2022)   PRAPARE - Administrator, Civil Service (Medical): No    Lack of Transportation (Non-Medical): No  Physical Activity: Inactive (07/08/2022)   Exercise Vital Sign    Days of Exercise per Week: 0 days    Minutes of Exercise per Session: 0 min  Stress: Stress Concern Present (07/08/2022)   [employer] of Occupational Health - Occupational Stress Questionnaire    Feeling of Stress : To some extent  Social Connections: Socially Isolated (07/08/2022)   Social Connection and Isolation Panel [NHANES]    Frequency of Communication with Friends and Family:  More than three times a week    Frequency of Social Gatherings with Friends and Family: More than three times a week    Attends Religious Services: Never    Database administrator or Organizations: No  Attends Banker Meetings: Never    Marital Status: Divorced  Catering manager Violence: Not At Risk (07/08/2022)   Humiliation, Afraid, Rape, and Kick questionnaire    Fear of Current or Ex-Partner: No    Emotionally Abused: No    Physically Abused: No    Sexually Abused: No    Review of Systems: General: Negative for fever, chills, fatigue, weakness. Eyes: Negative for vision changes.  ENT: Negative for hoarseness, difficulty swallowing , nasal congestion. CV: Negative for chest pain, angina, palpitations, dyspnea on exertion, peripheral edema.  Respiratory: Negative for dyspnea at rest, dyspnea on exertion, cough, sputum, wheezing.  GI: See history of present illness. GU:  Negative for dysuria, hematuria, urinary incontinence, urinary frequency, nocturnal urination.  MS: Negative for joint pain, low back pain.  Derm: Negative for rash or itching.  Neuro: Negative for weakness, abnormal sensation, seizure, frequent headaches, memory loss, confusion.  Psych: Negative for anxiety, depression Endo: Negative for unusual weight change.  Heme: Negative for bruising or bleeding. Allergy: Negative for rash or hives.  Physical Exam: Vital signs in last 24 hours: Temp:  [98.3 F (36.8 C)] 98.3 F (36.8 C) (12/23 0634) Pulse Rate:  [71] 71 (12/23 0634) Resp:  [18] 18 (12/23 0634) BP: (159)/(73) 159/73 (12/23 0634) SpO2:  [100 %] 100 % (12/23 9147)   General:   Alert,  Well-developed, well-nourished, pleasant and cooperative in NAD Head:  Normocephalic and atraumatic. Eyes:  Sclera clear, no icterus.   Conjunctiva pink. Ears:  Normal auditory acuity. Nose:  No deformity, discharge,  or lesions. Msk:  Symmetrical without gross deformities. Normal posture. Extremities:   Without clubbing or edema. Neurologic:  Alert and  oriented x4;  grossly normal neurologically. Skin:  Intact without significant lesions or rashes. Psych:  Alert and cooperative. Normal mood and affect.   Impression/Plan: Regina Richardson is here for an EGD to be performed for surveillance, personal history of SCC of esophagus s/p submucosal resection.   Risks, benefits, limitations, imponderables and alternatives regarding procedure have been reviewed with the patient. Questions have been answered. All parties agreeable.

## 2023-07-15 NOTE — Anesthesia Preprocedure Evaluation (Signed)
Anesthesia Evaluation  Patient identified by MRN, date of birth, ID band Patient awake    Reviewed: Allergy & Precautions, H&P , NPO status , Patient's Chart, lab work & pertinent test results, reviewed documented beta blocker date and time   Airway Mallampati: II  TM Distance: >3 FB Neck ROM: full    Dental no notable dental hx.    Pulmonary neg pulmonary ROS, Current Smoker and Patient abstained from smoking.   Pulmonary exam normal breath sounds clear to auscultation       Cardiovascular Exercise Tolerance: Good hypertension, negative cardio ROS + dysrhythmias  Rhythm:regular Rate:Normal     Neuro/Psych  PSYCHIATRIC DISORDERS Anxiety Depression    negative neurological ROS  negative psych ROS   GI/Hepatic negative GI ROS, Neg liver ROS, PUD,GERD  ,,  Endo/Other  negative endocrine ROS    Renal/GU negative Renal ROS  negative genitourinary   Musculoskeletal   Abdominal   Peds  Hematology negative hematology ROS (+)   Anesthesia Other Findings   Reproductive/Obstetrics negative OB ROS                             Anesthesia Physical Anesthesia Plan  ASA: 3  Anesthesia Plan: General   Post-op Pain Management:    Induction:   PONV Risk Score and Plan: Propofol infusion  Airway Management Planned:   Additional Equipment:   Intra-op Plan:   Post-operative Plan:   Informed Consent: I have reviewed the patients History and Physical, chart, labs and discussed the procedure including the risks, benefits and alternatives for the proposed anesthesia with the patient or authorized representative who has indicated his/her understanding and acceptance.     Dental Advisory Given  Plan Discussed with: CRNA  Anesthesia Plan Comments:        Anesthesia Quick Evaluation

## 2023-07-15 NOTE — Anesthesia Postprocedure Evaluation (Signed)
Anesthesia Post Note  Patient: Regina Richardson  Procedure(s) Performed: ESOPHAGOGASTRODUODENOSCOPY (EGD) WITH PROPOFOL  Patient location during evaluation: Phase II Anesthesia Type: General Level of consciousness: awake Pain management: pain level controlled Vital Signs Assessment: post-procedure vital signs reviewed and stable Respiratory status: spontaneous breathing and respiratory function stable Cardiovascular status: blood pressure returned to baseline and stable Postop Assessment: no headache and no apparent nausea or vomiting Anesthetic complications: no Comments: Late entry   No notable events documented.   Last Vitals:  Vitals:   07/15/23 0634 07/15/23 0750  BP: (!) 159/73 101/62  Pulse: 71 83  Resp: 18 19  Temp: 36.8 C 37.2 C  SpO2: 100% 100%    Last Pain:  Vitals:   07/15/23 0750  TempSrc: Oral  PainSc: 0-No pain                 Windell Norfolk

## 2023-07-15 NOTE — Transfer of Care (Signed)
Immediate Anesthesia Transfer of Care Note  Patient: Regina Richardson  Procedure(s) Performed: ESOPHAGOGASTRODUODENOSCOPY (EGD) WITH PROPOFOL  Patient Location: PACU  Anesthesia Type:General  Level of Consciousness: awake, alert , and oriented  Airway & Oxygen Therapy: Patient Spontanous Breathing  Post-op Assessment: Report given to RN and Post -op Vital signs reviewed and stable  Post vital signs: Reviewed and stable  Last Vitals:  Vitals Value Taken Time  BP 101/62 07/15/23 0750  Temp 37.2 C 07/15/23 0750  Pulse 83 07/15/23 0750  Resp 19 07/15/23 0750  SpO2 100 % 07/15/23 0750    Last Pain:  Vitals:   07/15/23 0750  TempSrc: Oral  PainSc: 0-No pain         Complications: No notable events documented.

## 2023-07-15 NOTE — Op Note (Signed)
[facility] Patient Name: Regina Richardson Procedure Date: 07/15/2023 7:14 AM MRN: [id number] Date of Birth: 09-May-1961 Attending MD: Hennie Duos. Marletta Lor , [place], [id number] CSN: [id number] Age: 62 Admit Type: Outpatient Procedure:                Upper GI endoscopy Indications:              Dysphagia, Follow SCC esophagus Providers:                Hennie Duos. Marletta Lor, DO, Francoise Ceo RN, RN, Lennice Sites Technician, Technician Referring MD:              Medicines:                See the Anesthesia note for documentation of the                            administered medications Complications:            No immediate complications. Estimated Blood Loss:     Estimated blood loss: none. Procedure:                Pre-Anesthesia Assessment:                           - The anesthesia plan was to use monitored                            anesthesia care (MAC).                           After obtaining informed consent, the endoscope was                            passed under direct vision. Throughout the                            procedure, the patient's blood pressure, pulse, and                            oxygen saturations were monitored continuously. The                            GIF-H190 ([id number]) scope was introduced through the                            mouth, and advanced to the second part of duodenum.                            The upper GI endoscopy was accomplished without                            difficulty. The patient tolerated the procedure                            well. Scope  In: 7:43:56 AM Scope Out: 7:46:26 AM Total Procedure Duration: 0 hours 2 minutes 30 seconds  Findings:      A 10 mm scar was found in the middle third of the esophagus, 23 cm from       the incisors. The scar tissue was healthy in appearance.      The examined duodenum was normal.      The Z-line was regular and was found 39 cm from the incisors.      The entire examined  stomach was normal.      The duodenal bulb, first portion of the duodenum and second portion of       the duodenum were normal. Impression:               - Scar in the middle third of the esophagus.                           - Normal examined duodenum.                           - Z-line regular, 39 cm from the incisors.                           - Normal stomach.                           - Normal duodenal bulb, first portion of the                            duodenum and second portion of the duodenum.                           - No specimens collected. Moderate Sedation:      Per Anesthesia Care Recommendation:           - Patient has a contact number available for                            emergencies. The signs and symptoms of potential                            delayed complications were discussed with the                            patient. Return to normal activities tomorrow.                            Written discharge instructions were provided to the                            patient.                           - Resume previous diet.                           - Continue present medications.                           -  Await pathology results.                           - Repeat upper endoscopy in 1 year for surveillance.                           - Return to GI clinic in 3 months. Procedure Code(s):        --- Professional ---                           [phone], Esophagogastroduodenoscopy, flexible,                            transoral; diagnostic, including collection of                            specimen(s) by brushing or washing, when performed                            (separate procedure) Diagnosis Code(s):        --- Professional ---                           K22.89, Other specified disease of esophagus                           R13.10, Dysphagia, unspecified CPT copyright 2022 American Medical Association. All rights reserved. The codes documented in this report are  preliminary and upon coder review may  be revised to meet current compliance requirements. Hennie Duos. Marletta Lor, DO Hennie Duos. Marletta Lor, DO 07/15/2023 7:50:02 AM This report has been signed electronically. Number of Addenda: 0

## 2023-07-15 NOTE — Discharge Instructions (Signed)
EGD Discharge instructions Please read the instructions outlined below and refer to this sheet in the next few weeks. These discharge instructions provide you with general information on caring for yourself after you leave the hospital. Your doctor may also give you specific instructions. While your treatment has been planned according to the most current medical practices available, unavoidable complications occasionally occur. If you have any problems or questions after discharge, please call your doctor. ACTIVITY You may resume your regular activity but move at a slower pace for the next 24 hours.  Take frequent rest periods for the next 24 hours.  Walking will help expel (get rid of) the air and reduce the bloated feeling in your abdomen.  No driving for 24 hours (because of the anesthesia (medicine) used during the test).  You may shower.  Do not sign any important legal documents or operate any machinery for 24 hours (because of the anesthesia used during the test).  NUTRITION Drink plenty of fluids.  You may resume your normal diet.  Begin with a light meal and progress to your normal diet.  Avoid alcoholic beverages for 24 hours or as instructed by your caregiver.  MEDICATIONS You may resume your normal medications unless your caregiver tells you otherwise.  WHAT YOU CAN EXPECT TODAY You may experience abdominal discomfort such as a feeling of fullness or "gas" pains.  FOLLOW-UP Your doctor will discuss the results of your test with you.  SEEK IMMEDIATE MEDICAL ATTENTION IF ANY OF THE FOLLOWING OCCUR: Excessive nausea (feeling sick to your stomach) and/or vomiting.  Severe abdominal pain and distention (swelling).  Trouble swallowing.  Temperature over 101 F (37.8 C).  Rectal bleeding or vomiting of blood.    Overall, everything looked great today.  Scar noted in your esophagus, no recurrence of previous lesion removed.  Stomach and small bowel normal.  Previous noted duodenal  ulcers have completely healed.  Your esophagus was wide open, I did not need to stretch it today.  Continue current medications.  Follow-up with me in 3 months.  Hope you have a Merry Christmas!  Hennie Duos. Marletta Lor, D.O. Gastroenterology and Hepatology [facility]

## 2023-08-02 ENCOUNTER — Other Ambulatory Visit: Payer: Self-pay

## 2023-08-02 DIAGNOSIS — Z122 Encounter for screening for malignant neoplasm of respiratory organs: Secondary | ICD-10-CM

## 2023-08-02 DIAGNOSIS — Z87891 Personal history of nicotine dependence: Secondary | ICD-10-CM

## 2023-08-07 DIAGNOSIS — M79672 Pain in left foot: Secondary | ICD-10-CM | POA: Diagnosis not present

## 2023-08-07 DIAGNOSIS — M19071 Primary osteoarthritis, right ankle and foot: Secondary | ICD-10-CM | POA: Diagnosis not present

## 2023-08-07 DIAGNOSIS — M79671 Pain in right foot: Secondary | ICD-10-CM | POA: Diagnosis not present

## 2023-08-07 DIAGNOSIS — M25373 Other instability, unspecified ankle: Secondary | ICD-10-CM | POA: Diagnosis not present

## 2023-08-07 DIAGNOSIS — S99912S Unspecified injury of left ankle, sequela: Secondary | ICD-10-CM | POA: Diagnosis not present

## 2023-08-07 DIAGNOSIS — S99911S Unspecified injury of right ankle, sequela: Secondary | ICD-10-CM | POA: Diagnosis not present

## 2023-08-07 DIAGNOSIS — M7989 Other specified soft tissue disorders: Secondary | ICD-10-CM | POA: Diagnosis not present

## 2023-08-07 DIAGNOSIS — M19072 Primary osteoarthritis, left ankle and foot: Secondary | ICD-10-CM | POA: Diagnosis not present

## 2023-08-30 ENCOUNTER — Ambulatory Visit: Payer: Medicare HMO | Admitting: Family Medicine

## 2023-09-05 ENCOUNTER — Ambulatory Visit (HOSPITAL_COMMUNITY)
Admission: RE | Admit: 2023-09-05 | Discharge: 2023-09-05 | Disposition: A | Discharge status: Home / Self Care | Payer: Medicare HMO | Source: Ambulatory Visit | Attending: Physician Assistant | Admitting: Physician Assistant

## 2023-09-05 DIAGNOSIS — Z87891 Personal history of nicotine dependence: Secondary | ICD-10-CM | POA: Insufficient documentation

## 2023-09-05 DIAGNOSIS — F17210 Nicotine dependence, cigarettes, uncomplicated: Secondary | ICD-10-CM | POA: Diagnosis not present

## 2023-09-05 DIAGNOSIS — Z122 Encounter for screening for malignant neoplasm of respiratory organs: Secondary | ICD-10-CM | POA: Diagnosis not present

## 2023-09-12 ENCOUNTER — Ambulatory Visit: Payer: Medicare HMO | Admitting: Internal Medicine

## 2023-09-18 ENCOUNTER — Other Ambulatory Visit: Payer: Self-pay | Admitting: Family Medicine

## 2023-09-18 ENCOUNTER — Other Ambulatory Visit: Payer: Self-pay

## 2023-09-18 DIAGNOSIS — K219 Gastro-esophageal reflux disease without esophagitis: Secondary | ICD-10-CM

## 2023-09-18 MED ORDER — PANTOPRAZOLE SODIUM 40 MG PO TBEC: 40.0000 mg | DELAYED_RELEASE_TABLET | Freq: Two times a day (BID) | ORAL | 0 refills | Status: DC

## 2023-09-18 NOTE — Telephone Encounter (Signed)
 Copied from CRM [id number]. Topic: Clinical - Medication Refill >> Sep 18, 2023  3:15 PM Gildardo Pounds wrote: Most Recent Primary Care Visit:  Provider: [facility]  Department: [facility]  Visit Type: LAB  Date: 04/01/2023  Medication: pantoprazole (PROTONIX) 40 MG tablet  Has the patient contacted their pharmacy? No (Agent: If no, request that the patient contact the pharmacy for the refill. If patient does not wish to contact the pharmacy document the reason why and proceed with request.) (Agent: If yes, when and what did the pharmacy advise?)  Is this the correct pharmacy for this prescription? Yes If no, delete pharmacy and type the correct one.  This is the patient's preferred pharmacy:  [id number] - [place], [place] - [address] [place] [address] Phone: [phone] Fax: [phone]   Has the prescription been filled recently? No  Is the patient out of the medication? Yes  Has the patient been seen for an appointment in the last year OR does the patient have an upcoming appointment? Yes  Can we respond through MyChart? Yes  Agent: Please be advised that Rx refills may take up to 3 business days. We ask that you follow-up with your pharmacy.

## 2023-09-24 NOTE — Progress Notes (Signed)
 Patient notified of LDCT Lung Cancer Screening Results via mail with the recommendation to follow-up in 12 months. Patient's referring provider has been sent a copy of results. Results are as follows:  IMPRESSION: 1. Lung-RADS 1, negative. Continue annual screening with low-dose chest CT without contrast in 12 months. 2. Cirrhosis. 3. Cholelithiasis. 4. Left adrenal adenoma. 5. Aortic atherosclerosis (ICD10-I70.0). Coronary artery calcification. 6.  Emphysema (ICD10-J43.9).

## 2023-10-10 ENCOUNTER — Other Ambulatory Visit: Payer: Self-pay | Admitting: Family Medicine

## 2023-10-15 ENCOUNTER — Other Ambulatory Visit: Payer: Self-pay | Admitting: Internal Medicine

## 2023-10-15 DIAGNOSIS — K219 Gastro-esophageal reflux disease without esophagitis: Secondary | ICD-10-CM

## 2023-10-21 ENCOUNTER — Other Ambulatory Visit: Payer: Self-pay

## 2023-10-21 DIAGNOSIS — K219 Gastro-esophageal reflux disease without esophagitis: Secondary | ICD-10-CM

## 2023-10-21 MED ORDER — PANTOPRAZOLE SODIUM 40 MG PO TBEC: 40.0000 mg | DELAYED_RELEASE_TABLET | Freq: Two times a day (BID) | ORAL | 0 refills | Status: DC

## 2023-10-28 NOTE — Progress Notes (Deleted)
 GI Office Note    Referring Provider: No ref. provider found Primary Care Physician:  Pcp, No  Primary Gastroenterologist: Hennie Duos. Marletta Lor, DO   Chief Complaint   No chief complaint on file.   History of Present Illness   Regina Richardson is a 63 y.o. female presenting today for follow up. Last seen 04/2023. H/o GERD, squamous cell carcinoma of esopageal resected at [facility], etoh cirrhosis.   Since last ov, EGD 06/2023. Scar in middle third of esophagus. Repeat EGD in 1 year. Abd u/s 04/2024 with no hepatoma, cholelithiasis and cirrhosis.   Cirrhosis: initially seen in our clinic October 2021. Work-up in included hemochromatosis DNA negative for C282Y and H63D, ferritin returned to normal at 91, alk phos 195, AST 66, ALT 31, ASMA negative, ANA negative, ANA negative, IgG elevated at 1702, IgA elevated at 783.  Suspected IgA and IgG elevation may be secondary to alcohol.  AFP 3.5. Hepatitis C antibody negative, hepatitis B surface antigen negative.  Advise she continue to work towards alcohol cessation.  She was lost to follow-up.    Hospitalization at [facility] October 2023 after presenting with abdominal pain. CT A/P with contrast showing moderately severe duodenitis, hepatic cirrhosis, stable benign left adrenal adenoma. Gastrin level 294, not felt to be significantly elevated in the range of a gastrinoma, plan for repeat testing off PPI if possible as an outpatient. MELD Na of 9 while inpatient.   EGD April 27, 2022: - Scar in the middle third of the esophagus. - Mucosal nodule found in the esophagus. Biopsied. - Mucous like gastric fluid. Fluid aspiration performed. Normal stomach biopsied. - Multiple non-bleeding duodenal ulcers with a clean ulcer base (Forrest Class III). - Duodenitis -Gastric biopsy showed reactive gastropathy. -Esophageal nodule biopsy showed focal benign gastric mucosa consistent with inlet patch, squamous mucosa.   Had repeat lab work done February 2024.  Alk  phos 173, AST 41, ALT 32, T. bili 0.2, negative AMA, negative ANA, negative smooth muscle, IgG 1789, IgA 688. H pylori stool Ag negative. Repeat gastrin level WNL.   MELD 3.0 of 8   No history of hepatic encephalopathy.  No history of portal hypertension, ascites, esophageal varices.   Duodenal ulcers/chronic GERD-abdominal pain has resolved.  H. pylori stool antigen negative.  Gastrin level WNL.  GERD well-controlled.  Does complain to me about worsening oropharyngeal dysphagia.  Also notes some associated odynophagia feels like pills get stuck in the back of her throat.   Pertinent procedure history:   Colonoscopy February 2019 with Dr. Russella Dar with [facility] for abnormal cecum on CT. Findings included prominent and moderately lipomatous ileocecal valve, 9 mm sessile polyp in hepatic flexure, 8 mm sessile polyp in the descending colon, otherwise normal exam.  Pathology with sessile serrated polyp and hyperplastic polyp. Recommended repeat colonoscopy in 5 years.   EGD/colonoscopy in February 2019 for anemia and epigastric abdominal pain.  She was found to have LA grade B esophagitis without bleeding s/p biopsied, diffuse moderate inflammation and granularity in the entire examined stomach s/p biopsied, normal examined duodenum s/p biopsy. Gastric biopsy with chronic inactive gastritis without H. pylori, esophageal biopsy with mildly inflamed squamous mucosa, duodenal biopsy benign. H.pylori breast test positive 07/2017. Patient denies treatment at that time.   Procedures 06/15/2020 performed by Dr. Karilyn Cota: Colonoscopy: 2 polyps resected and retrieved, 1 small polyp in the distal sigmoid biopsy, external hemorrhoids.  Pathology with 1 tubular adenoma and 1 hyperplastic polyp.  EGD: Proximal esophageal web, focal  esophagitis at proximal esophagus s/p biopsy, benign-appearing esophageal stenosis s/p dilated, portal hypertensive gastropathy.  Esophageal biopsy with moderate squamous dysplasia.  Dr.  Karilyn Cota recommended repeat EGD in about 8 weeks.   EGD March 2022: Eroded, inflamed mucosa in the esophagus, portal hypertension gastropathy, esophageal biopsy showed moderate to severe squamous dysplasia    EGD/EUS with Dr. Meridee Score May 2022:  Arnoldo Lenis (with contact bleeding), granular, smooth, texture changed mucosa in the esophagus at 23-26 cm was encountered. Under NBI imaging and then spraying with Lugol's stain I could visualize a lesion that was 3 cm in length and encompassed 30-40% of the esophageal wall. Biopsied quickly but not as in depth as I would have like due to increased coughing and also 2 prior EGDs with Squamous dysplasia already being noted. - Erythematous mucosa in the stomach. Biopsied. - Possible mild portal hypertensive gastropathy in proximal stomach (though again, normal spleen size and only hepatomegaly appreciated on most recent CT). - Endosonographic imaging in the esophagus showed no wall thickening throughout. - One benign lymph node was visualized in the middle paraesophageal mediastinum (level 33M). Tissue has not been obtained. However, the endosonographic appearance is suggestive of benign inflammatory changes. - Pancreatic parenchymal abnormalities consisting of lobularity were noted in the pancreatic    Biopsy showed mild chronic gastric inflammation, negative for H. pylori, distal esophageal biopsy with slight inflammation, biopsies from 23 to 36 cm showed moderate to severe squamous dysplasia.   EGD August 2022: At [facility].    Impression: - Two large plaques in the prox - middle third of the  esophagus. Clips (MR conditional) were placed. - Normal stomach. - Normal examined duodenum. - Endoscopic submucosal dissection was performed.  Resection and retrieval were complete. -Two foci of squamous cell carcinoma in situ, focally extending to the peripheral inked margin.No invasive carcinoma is seen.Multiple levels are examined   EGD October 2022: At [facility].    Impression: - Scar in the proximal esophagus and in the mid  esophagus. Biopsied. Dilated. - Normal stomach. - Normal examined duodenum. -if path normal, repeat EGD in 1 year -path showed Esophageal squamous mucosa with reactive change. Negative for dysplasia or carcinoma.        Medications   Current Outpatient Medications  Medication Sig Dispense Refill   allopurinol (ZYLOPRIM) 300 MG tablet Take 1 tablet (300 mg total) by mouth daily. 90 tablet 3   budesonide-formoterol (SYMBICORT) 160-4.5 MCG/ACT inhaler Inhale 2 puffs into the lungs 2 (two) times daily. (Patient not taking: Reported on 04/24/2023) 1 each 5   buPROPion (WELLBUTRIN XL) 150 MG 24 hr tablet TAKE 3 TABLETS BY MOUTH DAILY. 270 tablet 0   DULoxetine (CYMBALTA) 60 MG capsule Take 2 capsules (120 mg total) by mouth daily. 180 capsule 1   folic acid (FOLVITE) 1 MG tablet Take 1 tablet (1 mg total) by mouth daily. 90 tablet 3   methocarbamol (ROBAXIN) 500 MG tablet Take 1 tablet (500 mg total) by mouth every 8 (eight) hours as needed for muscle spasms. 90 tablet 5   metoprolol succinate (TOPROL-XL) 100 MG 24 hr tablet Take 1 tablet (100 mg total) by mouth daily. 90 tablet 3   pantoprazole (PROTONIX) 40 MG tablet Take 1 tablet (40 mg total) by mouth 2 (two) times daily. 60 tablet 0   potassium chloride SA (KLOR-CON M) 20 MEQ tablet Take 1 tablet (20 mEq total) by mouth 2 (two) times daily for 4 days. 8 tablet 0   QUEtiapine (SEROQUEL) 100 MG tablet TAKE  1 TABLET BY MOUTH EVERYDAY AT BEDTIME 90 tablet 1   rosuvastatin (CRESTOR) 20 MG tablet Take 1 tablet (20 mg total) by mouth daily. 90 tablet 3   No current facility-administered medications for this visit.    Allergies   Allergies as of 10/29/2023   (No Known Allergies)     Past Medical History   Past Medical History:  Diagnosis Date   Alcoholism (HCC)    Anxiety    Apnea 06/11/2021   Arrhythmia    heart   Arthritis    Cancer (HCC)    cervical cancer in the  80s   Cirrhosis (HCC) 04/2020   alcoholic;    Depression    Dysrhythmia    GERD (gastroesophageal reflux disease)    Hypertension    Osteoporosis    Scoliosis    Urinary incontinence     Past Surgical History   Past Surgical History:  Procedure Laterality Date   BIOPSY  10/11/2020   Procedure: BIOPSY;  Surgeon: Lanelle Bal, DO;  Location: [facility];  Service: Endoscopy;;   BIOPSY  12/12/2020   Procedure: BIOPSY;  Surgeon: Lemar Lofty., MD;  Location: [facility];  Service: Gastroenterology;;   BIOPSY  04/27/2022   Procedure: BIOPSY;  Surgeon: Marguerita Merles, Reuel Boom, MD;  Location: [facility];  Service: Gastroenterology;;   CERVICAL BIOPSY  W/ LOOP ELECTRODE EXCISION     CESAREAN SECTION     COLONOSCOPY  08/2017   Dr. Russella Dar;  prominent and moderately lipomatous ileocecal valve, 9 mm sessile polyp in hepatic flexure, 8 mm sessile polyp in the descending colon, otherwise normal exam.  Pathology with sessile serrated polyp and hyperplastic polyp.  Recommended repeat colonoscopy in 5 years.   COLONOSCOPY N/A 06/15/2020   Procedure: COLONOSCOPY;  Surgeon: Malissa Hippo, MD; 2 polyps resected and retrieved, 1 small polyp in the distal sigmoid biopsy, external hemorrhoids.  Pathology with 1 tubular adenoma and 1 hyperplastic polyp.    COLPOSCOPY     EGD with mucosal resection  03/16/2021   [facility]: 2 large plaques in the proximal-mid third of the esophagus s/p endoscopic submucosal resection and retrieval, placement of 4 MR conditional clips.  Normal stomach, normal examined duodenum.  Pathology revealed squamous cell carcinoma in situ focally extending to the peripheral inked margin.  No invasive carcinoma. Recommended repeat EGD in 3 weeks.   ESOPHAGEAL DILATION N/A 06/15/2020   Procedure: ESOPHAGEAL DILATION;  Surgeon: Malissa Hippo, MD;  Location: [facility];  Service: Endoscopy;  Laterality: N/A;   ESOPHAGOGASTRODUODENOSCOPY  08/2017   Dr. Russella Dar; LA  grade B esophagitis without bleeding s/p biopsied, diffuse moderate inflammation and granularity in the entire examined stomach s/p biopsied, normal examined duodenum s/p biopsy. Gastric biopsy with chronic inactive gastritis without H. pylori, esophageal biopsy with mildly inflamed squamous mucosa, duodenal biopsy benign.   ESOPHAGOGASTRODUODENOSCOPY N/A 06/15/2020   Procedure: ESOPHAGOGASTRODUODENOSCOPY (EGD);  Surgeon: Malissa Hippo, MD;  Proximal esophageal web, focal esophagitis at proximal esophagus s/p biopsy, benign-appearing esophageal stenosis s/p dilated, portal hypertensive gastropathy.  Esophageal biopsy with moderate squamous dysplasia.     ESOPHAGOGASTRODUODENOSCOPY (EGD) WITH PROPOFOL N/A 10/11/2020   Surgeon: Lanelle Bal, DO; Eroded, inflamed mucosa in the esophagus. Biopsied. Portal hypertensive gastropathy.  Recommended PPI twice daily.  Pathology with moderate to severe squamous dysplasia.   ESOPHAGOGASTRODUODENOSCOPY (EGD) WITH PROPOFOL N/A 12/12/2020   Surgeon: Lemar Lofty., MD; Arnoldo Lenis, granular, mucosa in the esophagus at 23-26 cm.  Lesion 3 cm in length  and encompassed 30-40% of esophageal wall s/p quick biopsy.  Erythematous mucosa in the stomach biopsied, portal hypertensive gastropathy.  Gastric biopsy with mild chronic inflammation, negative for H. pylori.  Esophageal biopsy with moderate to severe squamous dysplasia.   ESOPHAGOGASTRODUODENOSCOPY (EGD) WITH PROPOFOL N/A 04/27/2022   Procedure: ESOPHAGOGASTRODUODENOSCOPY (EGD) WITH PROPOFOL;  Surgeon: Dolores Frame, MD;  Location: [facility];  Service: Gastroenterology;  Laterality: N/A;   FINGER SURGERY     HEMORRHOID SURGERY     left collar bone surgery     had fx, pin placed and then complitcations caused them to remove most of the bone   left forearm surgery     from left elbow down   TOTAL ABDOMINAL HYSTERECTOMY     still has ovaries   UPPER ESOPHAGEAL ENDOSCOPIC ULTRASOUND (EUS) N/A  12/12/2020   Surgeon: Lemar Lofty., MD;without esophageal wall thickening, 1 lymph node in the middle paraesophageal mediastinum that was suggestive of benign inflammatory changes, pancreatic lobularity in pancreatic body, queried possibility of chronic pancreatitis though complete EUS not performed and no history of pancreatitis.    Past Family History   Family History  Problem Relation Age of Onset   Alcohol abuse Mother    Liver disease Mother        alcoholic cirrhosis   Heart disease Father    Alcohol abuse Father    Lung cancer Father    Diabetes Sister    Cirrhosis Brother        secondary to alcohol.    Liver disease Brother    Liver disease Sister        Alcoholic cirrhosis   Diabetes Maternal Aunt    Kidney disease Maternal Aunt    Diabetes Cousin        mat cousin   Breast cancer Neg Hx    Colon cancer Neg Hx    Esophageal cancer Neg Hx    Stomach cancer Neg Hx    Rectal cancer Neg Hx    Pancreatic cancer Neg Hx    Inflammatory bowel disease Neg Hx     Past Social History   Social History   Socioeconomic History   Marital status: Divorced    Spouse name: Not on file   Number of children: 1   Years of education: 12   Highest education level: Associate degree: occupational, Scientist, product/process development, or vocational program  Occupational History   Occupation: retired  Tobacco Use   Smoking status: Every Day    Current packs/day: 0.50    Average packs/day: 0.5 packs/day for 40.0 years (20.0 ttl pk-yrs)    Types: Cigarettes    Passive exposure: Current   Smokeless tobacco: Never   Tobacco comments:    Smoking Cessation Offered.  Vaping Use   Vaping status: Never Used  Substance and Sexual Activity   Alcohol use: Not Currently    Alcohol/week: 12.0 standard drinks of alcohol    Types: 12 Cans of beer per week    Comment: 12 cans of beer daily   Drug use: No    Comment: Tried cocaine and marijuana in the 60s.    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    Moved back to [place] from [place]   Social Drivers of Health   Financial Resource Strain: High Risk (09/08/2023)   Overall Financial Resource Strain (CARDIA)  Difficulty of Paying Living Expenses: Very hard  Food Insecurity: Food Insecurity Present (09/08/2023)   Hunger Vital Sign    Worried About Running Out of Food in the Last Year: Often true    Ran Out of Food in the Last Year: Sometimes true  Transportation Needs: Unmet Transportation Needs (09/08/2023)   PRAPARE - Administrator, Civil Service (Medical): Yes    Lack of Transportation (Non-Medical): No  Physical Activity: Unknown (09/08/2023)   Exercise Vital Sign    Days of Exercise per Week: 0 days    Minutes of Exercise per Session: Not on file  Stress: Stress Concern Present (09/08/2023)   [employer] of Occupational Health - Occupational Stress Questionnaire    Feeling of Stress : Very much  Social Connections: Socially Isolated (09/08/2023)   Social Connection and Isolation Panel [NHANES]    Frequency of Communication with Friends and Family: Once a week    Frequency of Social Gatherings with Friends and Family: Never    Attends Religious Services: Never    Database administrator or Organizations: No    Attends Engineer, structural: Not on file    Marital Status: Widowed  Intimate Partner Violence: Not At Risk (07/08/2022)   Humiliation, Afraid, Rape, and Kick questionnaire    Fear of Current or Ex-Partner: No    Emotionally Abused: No    Physically Abused: No    Sexually Abused: No    Review of Systems   General: Negative for anorexia, weight loss, fever, chills, fatigue, weakness. ENT: Negative for hoarseness, difficulty swallowing , nasal congestion. CV: Negative for chest pain, angina, palpitations, dyspnea on exertion, peripheral edema.  Respiratory:  Negative for dyspnea at rest, dyspnea on exertion, cough, sputum, wheezing.  GI: See history of present illness. GU:  Negative for dysuria, hematuria, urinary incontinence, urinary frequency, nocturnal urination.  Endo: Negative for unusual weight change.     Physical Exam   There were no vitals taken for this visit.   General: Well-nourished, well-developed in no acute distress.  Eyes: No icterus. Mouth: Oropharyngeal mucosa moist and pink , no lesions erythema or exudate. Lungs: Clear to auscultation bilaterally.  Heart: Regular rate and rhythm, no murmurs rubs or gallops.  Abdomen: Bowel sounds are normal, nontender, nondistended, no hepatosplenomegaly or masses,  no abdominal bruits or hernia , no rebound or guarding.  Rectal: ***  Extremities: No lower extremity edema. No clubbing or deformities. Neuro: Alert and oriented x 4   Skin: Warm and dry, no jaundice.   Psych: Alert and cooperative, normal mood and affect.  Labs   Lab Results  Component Value Date   NA 141 07/15/2023   CL 103 07/15/2023   K 3.1 (L) 07/15/2023   CO2 26 07/11/2023   BUN 7 (L) 07/15/2023   CREATININE 0.70 07/15/2023   GFRNONAA >60 07/11/2023   CALCIUM 8.7 (L) 07/11/2023   PHOS 3.8 06/14/2020   ALBUMIN 3.7 07/11/2023   GLUCOSE 103 (H) 07/15/2023   Lab Results  Component Value Date   ALT 13 07/11/2023   AST 17 07/11/2023   GGT 870 (HH) 03/24/2020   ALKPHOS 82 07/11/2023   BILITOT 0.5 07/11/2023   Lab Results  Component Value Date   WBC 7.6 07/11/2023   HGB 7.8 (L) 07/11/2023   HCT 27.1 (L) 07/11/2023   MCV 71.9 (L) 07/11/2023   PLT 408 (H) 07/11/2023   Lab Results  Component Value Date   INR 1.1 07/11/2023   INR  1.1 01/07/2023   INR 1.1 08/27/2022    Imaging Studies   No results found.  Assessment       PLAN   ***   Leanna Battles. Melvyn Neth, MHS, PA-C [facility]

## 2023-10-29 ENCOUNTER — Ambulatory Visit: Admitting: Gastroenterology

## 2023-11-05 ENCOUNTER — Other Ambulatory Visit: Payer: Self-pay | Admitting: Family Medicine

## 2023-11-05 DIAGNOSIS — M4126 Other idiopathic scoliosis, lumbar region: Secondary | ICD-10-CM

## 2023-11-05 DIAGNOSIS — G8929 Other chronic pain: Secondary | ICD-10-CM

## 2023-11-06 ENCOUNTER — Other Ambulatory Visit: Payer: Self-pay | Admitting: Internal Medicine

## 2023-11-06 ENCOUNTER — Encounter: Payer: Self-pay | Admitting: Internal Medicine

## 2023-11-06 DIAGNOSIS — K219 Gastro-esophageal reflux disease without esophagitis: Secondary | ICD-10-CM

## 2023-11-06 MED ORDER — PANTOPRAZOLE SODIUM 40 MG PO TBEC
40.0000 mg | DELAYED_RELEASE_TABLET | Freq: Two times a day (BID) | ORAL | 11 refills | Status: DC
Start: 2023-11-06 — End: 2023-11-08

## 2023-11-08 ENCOUNTER — Encounter: Payer: Self-pay | Admitting: Family Medicine

## 2023-11-08 ENCOUNTER — Ambulatory Visit (INDEPENDENT_AMBULATORY_CARE_PROVIDER_SITE_OTHER): Payer: Medicare HMO | Admitting: Family Medicine

## 2023-11-08 VITALS — BP 120/67 | HR 88 | Ht 60.0 in | Wt 154.0 lb

## 2023-11-08 DIAGNOSIS — R7301 Impaired fasting glucose: Secondary | ICD-10-CM | POA: Diagnosis not present

## 2023-11-08 DIAGNOSIS — I10 Essential (primary) hypertension: Secondary | ICD-10-CM

## 2023-11-08 DIAGNOSIS — G8929 Other chronic pain: Secondary | ICD-10-CM

## 2023-11-08 DIAGNOSIS — E538 Deficiency of other specified B group vitamins: Secondary | ICD-10-CM

## 2023-11-08 DIAGNOSIS — E782 Mixed hyperlipidemia: Secondary | ICD-10-CM

## 2023-11-08 DIAGNOSIS — K219 Gastro-esophageal reflux disease without esophagitis: Secondary | ICD-10-CM

## 2023-11-08 DIAGNOSIS — E559 Vitamin D deficiency, unspecified: Secondary | ICD-10-CM | POA: Diagnosis not present

## 2023-11-08 DIAGNOSIS — M4126 Other idiopathic scoliosis, lumbar region: Secondary | ICD-10-CM

## 2023-11-08 DIAGNOSIS — H9190 Unspecified hearing loss, unspecified ear: Secondary | ICD-10-CM

## 2023-11-08 DIAGNOSIS — E038 Other specified hypothyroidism: Secondary | ICD-10-CM

## 2023-11-08 DIAGNOSIS — G4700 Insomnia, unspecified: Secondary | ICD-10-CM

## 2023-11-08 DIAGNOSIS — F418 Other specified anxiety disorders: Secondary | ICD-10-CM

## 2023-11-08 DIAGNOSIS — M545 Low back pain, unspecified: Secondary | ICD-10-CM

## 2023-11-08 MED ORDER — DULOXETINE HCL 60 MG PO CPEP: 120.0000 mg | ORAL_CAPSULE | Freq: Every day | ORAL | 1 refills | Status: DC

## 2023-11-08 MED ORDER — ROSUVASTATIN CALCIUM 20 MG PO TABS: 20.0000 mg | ORAL_TABLET | Freq: Every day | ORAL | 3 refills | Status: AC

## 2023-11-08 MED ORDER — FOLIC ACID 1 MG PO TABS: 1.0000 mg | ORAL_TABLET | Freq: Every day | ORAL | 3 refills | Status: DC

## 2023-11-08 MED ORDER — COLCHICINE 0.6 MG PO TABS: 0.6000 mg | ORAL_TABLET | Freq: Two times a day (BID) | ORAL | 0 refills | Status: DC

## 2023-11-08 MED ORDER — QUETIAPINE FUMARATE 100 MG PO TABS: ORAL_TABLET | ORAL | 1 refills | Status: AC

## 2023-11-08 MED ORDER — PANTOPRAZOLE SODIUM 40 MG PO TBEC: 40.0000 mg | DELAYED_RELEASE_TABLET | Freq: Two times a day (BID) | ORAL | 11 refills | Status: AC

## 2023-11-08 MED ORDER — METOPROLOL SUCCINATE ER 100 MG PO TB24
100.0000 mg | ORAL_TABLET | Freq: Every day | ORAL | 3 refills | Status: DC
Start: 2023-11-08 — End: 2024-04-27

## 2023-11-08 MED ORDER — METHOCARBAMOL 500 MG PO TABS: 500.0000 mg | ORAL_TABLET | Freq: Three times a day (TID) | ORAL | 5 refills | Status: DC | PRN

## 2023-11-08 MED ORDER — DICLOFENAC SODIUM 75 MG PO TBEC: 75.0000 mg | DELAYED_RELEASE_TABLET | Freq: Two times a day (BID) | ORAL | 2 refills | Status: DC

## 2023-11-08 MED ORDER — GABAPENTIN 300 MG PO CAPS: 300.0000 mg | ORAL_CAPSULE | Freq: Every day | ORAL | 3 refills | Status: DC

## 2023-11-08 NOTE — Assessment & Plan Note (Signed)
 Xray ordered Can take diclofenac  75 mg PRN Discussed to focus on maintaining good posture, using lumbar support while sitting, and avoiding prolonged sitting or heavy lifting. Engage in low-impact exercises like walking or swimming to strengthen core muscles and reduce strain on the spine. Apply heat or ice packs as needed for pain relief and consider physical therapy for targeted exercises.

## 2023-11-08 NOTE — Assessment & Plan Note (Signed)
 Trial on Gabapentin  300 mg at bedtime Explained to go to bed at the same time each night and get up at the same time each morning, including on the weekends. Make sure your bedroom is quiet, dark, relaxing, and at a comfortable temperature. Remove electronic devices, such as TVs, computers, and smart phones, from the bedroom.  Referral placed to sleep studies

## 2023-11-08 NOTE — Assessment & Plan Note (Signed)
 Vitals:   11/08/23 1002  BP: 120/67   Continue metoprolol  succinate 100 mg once daily Labs ordered. Discussed with  patient to monitor their blood pressure regularly and maintain a heart-healthy diet rich in fruits, vegetables, whole grains, and low-fat dairy, while reducing sodium intake to less than 2,300 mg per day. Regular physical activity, such as 30 minutes of moderate exercise most days of the week, will help lower blood pressure and improve overall cardiovascular health. Avoiding smoking, limiting alcohol consumption, and managing stress. Take  prescribed medication, & take it as directed and avoid skipping doses. Seek emergency care if your blood pressure is (over 180/100) or you experience chest pain, shortness of breath, or sudden vision changes.Patient verbalizes understanding regarding plan of care and all questions answered.

## 2023-11-08 NOTE — Progress Notes (Signed)
 New Patient Office Visit   Subjective   Patient ID: Regina Richardson, female    DOB: January 04, 1961  Age: 63 y.o. MRN: [id number]  CC:  Chief Complaint  Patient presents with   Establish Care    Sleep issues; has not slept in three days  Nerve issues w/ gout and swelling.  Allergies and left ear pain x 2 weeks w/ dizziness.     HPI Regina Richardson 63 year old female, presents to establish care. She  has a past medical history of Alcoholism (HCC), Anxiety, Apnea (06/11/2021), Arrhythmia, Arthritis, Cancer (HCC), Cirrhosis (HCC) (04/2020), Depression, Dysrhythmia, GERD (gastroesophageal reflux disease), Hypertension, Osteoporosis, PTSD (post-traumatic stress disorder), Scoliosis, and Urinary incontinence.  Insomnia: The patient presents with chronic insomnia, characterized by difficulty falling asleep, frequent awakenings throughout the night, and non-restorative sleep resulting in fatigue and daytime malaise. Patient reports they have not slept in the past two days. Although they are able to fall asleep initially, they awaken after approximately two hours and have significant difficulty returning to sleep.Symptoms have been ongoing for over a year and occur nightly, with a gradual worsening over time. Contributing factors include anxiety,  pain, and ongoing family stress. The patient denies alcohol use. Some relief is reported with ambient background noise such as a TV or fan. Past treatments have included medications such as trazodone  and Seroquel , which were ineffective. The patient typically goes to bed between 11 PM and 12 AM and reports falling asleep within 15-30 minutes with several nighttime awakening. There has been no prior diagnostic workup. Relevant medical history includes depression and chronic psychosocial stressors.  Chronic Back Pain: The patient reports persistent, chronic lower back pain, primarily localized to the lumbar region, with radiation to both thighs. The pain is described  as a constant aching sensation, rated 10/10 in severity, with no significant variation in intensity. Symptoms are aggravated by prolonged standing, certain positions, and bending. Associated symptoms include leg numbness and radiating pain. Contributing factors include a sedentary lifestyle and limited physical activity. The patient has trialed analgesics with only mild relief. No mention of prior imaging or specialty referrals at this time.      Outpatient Encounter Medications as of 11/08/2023  Medication Sig   allopurinol  (ZYLOPRIM ) 300 MG tablet Take 1 tablet (300 mg total) by mouth daily.   colchicine  0.6 MG tablet Take 1 tablet (0.6 mg total) by mouth 2 (two) times daily for 5 days. For gout flare up   gabapentin  (NEURONTIN ) 300 MG capsule Take 1 capsule (300 mg total) by mouth at bedtime.   umeclidinium-vilanterol (ANORO ELLIPTA ) 62.5-25 MCG/ACT AEPB Inhale 1 puff into the lungs in the morning.   [DISCONTINUED] buPROPion  (WELLBUTRIN  XL) 150 MG 24 hr tablet TAKE 3 TABLETS BY MOUTH DAILY.   [DISCONTINUED] diclofenac  (VOLTAREN ) 75 MG EC tablet Take 75 mg by mouth 2 (two) times daily.   [DISCONTINUED] DULoxetine  (CYMBALTA ) 60 MG capsule Take 2 capsules (120 mg total) by mouth daily.   [DISCONTINUED] folic acid  (FOLVITE ) 1 MG tablet Take 1 tablet (1 mg total) by mouth daily.   [DISCONTINUED] methocarbamol  (ROBAXIN ) 500 MG tablet Take 1 tablet (500 mg total) by mouth every 8 (eight) hours as needed for muscle spasms.   [DISCONTINUED] metoprolol  succinate (TOPROL -XL) 100 MG 24 hr tablet Take 1 tablet (100 mg total) by mouth daily.   [DISCONTINUED] pantoprazole  (PROTONIX ) 40 MG tablet Take 1 tablet (40 mg total) by mouth 2 (two) times daily.   [DISCONTINUED] QUEtiapine  (SEROQUEL ) 100 MG tablet  TAKE 1 TABLET BY MOUTH EVERYDAY AT BEDTIME   [DISCONTINUED] rosuvastatin  (CRESTOR ) 20 MG tablet Take 1 tablet (20 mg total) by mouth daily.   budesonide -formoterol  (SYMBICORT ) 160-4.5 MCG/ACT inhaler Inhale  2 puffs into the lungs 2 (two) times daily. (Patient not taking: Reported on 11/08/2023)   diclofenac  (VOLTAREN ) 75 MG EC tablet Take 1 tablet (75 mg total) by mouth 2 (two) times daily.   DULoxetine  (CYMBALTA ) 60 MG capsule Take 2 capsules (120 mg total) by mouth daily.   folic acid  (FOLVITE ) 1 MG tablet Take 1 tablet (1 mg total) by mouth daily.   methocarbamol  (ROBAXIN ) 500 MG tablet Take 1 tablet (500 mg total) by mouth every 8 (eight) hours as needed for muscle spasms.   metoprolol  succinate (TOPROL -XL) 100 MG 24 hr tablet Take 1 tablet (100 mg total) by mouth daily.   pantoprazole  (PROTONIX ) 40 MG tablet Take 1 tablet (40 mg total) by mouth 2 (two) times daily.   QUEtiapine  (SEROQUEL ) 100 MG tablet TAKE 1 TABLET BY MOUTH EVERYDAY AT BEDTIME   rosuvastatin  (CRESTOR ) 20 MG tablet Take 1 tablet (20 mg total) by mouth daily.   [DISCONTINUED] potassium chloride  SA (KLOR-CON  M) 20 MEQ tablet Take 1 tablet (20 mEq total) by mouth 2 (two) times daily for 4 days.   No facility-administered encounter medications on file as of 11/08/2023.    Past Surgical History:  Procedure Laterality Date   BIOPSY  10/11/2020   Procedure: BIOPSY;  Surgeon: Vinetta Greening, DO;  Location: [facility];  Service: Endoscopy;;   BIOPSY  12/12/2020   Procedure: BIOPSY;  Surgeon: Brice Campi Albino Alu., MD;  Location: [facility];  Service: Gastroenterology;;   BIOPSY  04/27/2022   Procedure: BIOPSY;  Surgeon: Umberto Ganong, Bearl Limes, MD;  Location: [facility];  Service: Gastroenterology;;   CERVICAL BIOPSY  W/ LOOP ELECTRODE EXCISION     CESAREAN SECTION     COLONOSCOPY  08/2017   Dr. Sandrea Cruel;  prominent and moderately lipomatous ileocecal valve, 9 mm sessile polyp in hepatic flexure, 8 mm sessile polyp in the descending colon, otherwise normal exam.  Pathology with sessile serrated polyp and hyperplastic polyp.  Recommended repeat colonoscopy in 5 years.   COLONOSCOPY N/A 06/15/2020   Procedure: COLONOSCOPY;   Surgeon: Ruby Corporal, MD; 2 polyps resected and retrieved, 1 small polyp in the distal sigmoid biopsy, external hemorrhoids.  Pathology with 1 tubular adenoma and 1 hyperplastic polyp.    COLPOSCOPY     EGD with mucosal resection  03/16/2021   [facility]: 2 large plaques in the proximal-mid third of the esophagus s/p endoscopic submucosal resection and retrieval, placement of 4 MR conditional clips.  Normal stomach, normal examined duodenum.  Pathology revealed squamous cell carcinoma in situ focally extending to the peripheral inked margin.  No invasive carcinoma. Recommended repeat EGD in 3 weeks.   ESOPHAGEAL DILATION N/A 06/15/2020   Procedure: ESOPHAGEAL DILATION;  Surgeon: Ruby Corporal, MD;  Location: [facility];  Service: Endoscopy;  Laterality: N/A;   ESOPHAGOGASTRODUODENOSCOPY  08/2017   Dr. Sandrea Cruel; LA grade B esophagitis without bleeding s/p biopsied, diffuse moderate inflammation and granularity in the entire examined stomach s/p biopsied, normal examined duodenum s/p biopsy. Gastric biopsy with chronic inactive gastritis without H. pylori, esophageal biopsy with mildly inflamed squamous mucosa, duodenal biopsy benign.   ESOPHAGOGASTRODUODENOSCOPY N/A 06/15/2020   Procedure: ESOPHAGOGASTRODUODENOSCOPY (EGD);  Surgeon: Ruby Corporal, MD;  Proximal esophageal web, focal esophagitis at proximal esophagus s/p biopsy, benign-appearing esophageal stenosis s/p  dilated, portal hypertensive gastropathy.  Esophageal biopsy with moderate squamous dysplasia.     ESOPHAGOGASTRODUODENOSCOPY (EGD) WITH PROPOFOL  N/A 10/11/2020   Surgeon: Vinetta Greening, DO; Eroded, inflamed mucosa in the esophagus. Biopsied. Portal hypertensive gastropathy.  Recommended PPI twice daily.  Pathology with moderate to severe squamous dysplasia.   ESOPHAGOGASTRODUODENOSCOPY (EGD) WITH PROPOFOL  N/A 12/12/2020   Surgeon: Normie Becton., MD; Floyd Hutchinson, granular, mucosa in the esophagus at 23-26 cm.  Lesion 3 cm in  length and encompassed 30-40% of esophageal wall s/p quick biopsy.  Erythematous mucosa in the stomach biopsied, portal hypertensive gastropathy.  Gastric biopsy with mild chronic inflammation, negative for H. pylori.  Esophageal biopsy with moderate to severe squamous dysplasia.   ESOPHAGOGASTRODUODENOSCOPY (EGD) WITH PROPOFOL  N/A 04/27/2022   Procedure: ESOPHAGOGASTRODUODENOSCOPY (EGD) WITH PROPOFOL ;  Surgeon: Urban Garden, MD;  Location: [facility];  Service: Gastroenterology;  Laterality: N/A;   FINGER SURGERY     HEMORRHOID SURGERY     left collar bone surgery     had fx, pin placed and then complitcations caused them to remove most of the bone   left forearm surgery     from left elbow down   TOTAL ABDOMINAL HYSTERECTOMY     still has ovaries   UPPER ESOPHAGEAL ENDOSCOPIC ULTRASOUND (EUS) N/A 12/12/2020   Surgeon: Normie Becton., MD;without esophageal wall thickening, 1 lymph node in the middle paraesophageal mediastinum that was suggestive of benign inflammatory changes, pancreatic lobularity in pancreatic body, queried possibility of chronic pancreatitis though complete EUS not performed and no history of pancreatitis.    Review of Systems  Constitutional:  Positive for malaise/fatigue. Negative for chills and fever.  HENT:  Positive for tinnitus.   Eyes:  Negative for blurred vision.  Respiratory:  Positive for shortness of breath.   Cardiovascular:  Negative for chest pain.  Gastrointestinal:  Negative for abdominal pain.  Genitourinary:  Negative for dysuria.  Musculoskeletal:  Positive for back pain, joint pain and myalgias.  Neurological:  Positive for dizziness.  Psychiatric/Behavioral:  Positive for depression. The patient has insomnia.       Objective    BP 120/67   Pulse 88   Ht 5' (1.524 m)   Wt 154 lb 0.6 oz (69.9 kg)   SpO2 98%   BMI 30.08 kg/m   Physical Exam Vitals reviewed.  Constitutional:      General: She is not in acute  distress.    Appearance: Normal appearance. She is not ill-appearing, toxic-appearing or diaphoretic.  HENT:     Head: Normocephalic.     Right Ear: Tympanic membrane normal.     Left Ear: Tympanic membrane normal.  Eyes:     General:        Right eye: No discharge.        Left eye: No discharge.     Conjunctiva/sclera: Conjunctivae normal.     Pupils: Pupils are equal, round, and reactive to light.  Cardiovascular:     Rate and Rhythm: Normal rate.     Pulses: Normal pulses.     Heart sounds: Normal heart sounds.  Pulmonary:     Effort: Pulmonary effort is normal. No respiratory distress.     Breath sounds: Normal breath sounds.  Abdominal:     General: Bowel sounds are normal.     Palpations: Abdomen is soft.     Tenderness: There is no abdominal tenderness. There is no right CVA tenderness, left CVA tenderness or guarding.  Musculoskeletal:  Thoracic back: Decreased range of motion.     Lumbar back: Tenderness present. Decreased range of motion. Positive right straight leg raise test. Negative left straight leg raise test.  Skin:    General: Skin is warm and dry.  Neurological:     General: No focal deficit present.     Mental Status: She is alert and oriented to person, place, and time.  Psychiatric:        Mood and Affect: Mood normal.        Behavior: Behavior normal.       Assessment & Plan:  Primary hypertension -     Lipid panel -     CMP14+EGFR -     CBC with Differential/Platelet  IFG (impaired fasting glucose) -     Hemoglobin A1c  TSH (thyroid -stimulating hormone deficiency) -     TSH + free T4  Vitamin D  deficiency -     VITAMIN D  25 Hydroxy (Vit-D Deficiency, Fractures)  Vitamin B12 deficiency -     Vitamin B12  Insomnia, unspecified type Assessment & Plan: Trial on Gabapentin  300 mg at bedtime Explained to go to bed at the same time each night and get up at the same time each morning, including on the weekends. Make sure your bedroom is  quiet, dark, relaxing, and at a comfortable temperature. Remove electronic devices, such as TVs, computers, and smart phones, from the bedroom.  Referral placed to sleep studies   Orders: -     Ambulatory referral to Sleep Studies  Lumbar pain Assessment & Plan: Xray ordered Can take diclofenac  75 mg PRN Discussed to focus on maintaining good posture, using lumbar support while sitting, and avoiding prolonged sitting or heavy lifting. Engage in low-impact exercises like walking or swimming to strengthen core muscles and reduce strain on the spine. Apply heat or ice packs as needed for pain relief and consider physical therapy for targeted exercises.   Orders: -     DG Lumbar Spine 2-3 Views; Future  Hard of hearing -     Ambulatory referral to Audiology  Other idiopathic scoliosis, lumbar region -     Methocarbamol ; Take 1 tablet (500 mg total) by mouth every 8 (eight) hours as needed for muscle spasms.  Dispense: 90 tablet; Refill: 5  Chronic midline low back pain without sciatica Assessment & Plan: Xray ordered Can take diclofenac  75 mg PRN Discussed to focus on maintaining good posture, using lumbar support while sitting, and avoiding prolonged sitting or heavy lifting. Engage in low-impact exercises like walking or swimming to strengthen core muscles and reduce strain on the spine. Apply heat or ice packs as needed for pain relief and consider physical therapy for targeted exercises.   Orders: -     Methocarbamol ; Take 1 tablet (500 mg total) by mouth every 8 (eight) hours as needed for muscle spasms.  Dispense: 90 tablet; Refill: 5  Essential hypertension Assessment & Plan: Vitals:   11/08/23 1002  BP: 120/67   Continue metoprolol  succinate 100 mg once daily Labs ordered. Discussed with  patient to monitor their blood pressure regularly and maintain a heart-healthy diet rich in fruits, vegetables, whole grains, and low-fat dairy, while reducing sodium intake to less than  2,300 mg per day. Regular physical activity, such as 30 minutes of moderate exercise most days of the week, will help lower blood pressure and improve overall cardiovascular health. Avoiding smoking, limiting alcohol consumption, and managing stress. Take  prescribed medication, & take it as directed  and avoid skipping doses. Seek emergency care if your blood pressure is (over 180/100) or you experience chest pain, shortness of breath, or sudden vision changes.Patient verbalizes understanding regarding plan of care and all questions answered.   Orders: -     Metoprolol  Succinate ER; Take 1 tablet (100 mg total) by mouth daily.  Dispense: 90 tablet; Refill: 3  Gastro-esophageal reflux disease without esophagitis -     Pantoprazole  Sodium; Take 1 tablet (40 mg total) by mouth 2 (two) times daily.  Dispense: 60 tablet; Refill: 11  Depression with anxiety -     QUEtiapine  Fumarate; TAKE 1 TABLET BY MOUTH EVERYDAY AT BEDTIME  Dispense: 90 tablet; Refill: 1  Mixed hyperlipidemia -     Rosuvastatin  Calcium ; Take 1 tablet (20 mg total) by mouth daily.  Dispense: 90 tablet; Refill: 3  Other orders -     Gabapentin ; Take 1 capsule (300 mg total) by mouth at bedtime.  Dispense: 30 capsule; Refill: 3 -     Colchicine ; Take 1 tablet (0.6 mg total) by mouth 2 (two) times daily for 5 days. For gout flare up  Dispense: 10 tablet; Refill: 0 -     Diclofenac  Sodium; Take 1 tablet (75 mg total) by mouth 2 (two) times daily.  Dispense: 60 tablet; Refill: 2 -     DULoxetine  HCl; Take 2 capsules (120 mg total) by mouth daily.  Dispense: 180 capsule; Refill: 1 -     Folic Acid ; Take 1 tablet (1 mg total) by mouth daily.  Dispense: 90 tablet; Refill: 3    Return in about 4 months (around 03/09/2024), or if symptoms worsen or fail to improve, for chronic follow-up.   Avelino Lek Amber Bail, FNP

## 2023-11-08 NOTE — Patient Instructions (Signed)

## 2023-11-12 LAB — CMP14+EGFR
ALT: 21 IU/L (ref 0–32)
AST: 40 IU/L (ref 0–40)
Albumin: 4 g/dL (ref 3.9–4.9)
Alkaline Phosphatase: 196 IU/L — ABNORMAL HIGH (ref 44–121)
BUN/Creatinine Ratio: 6 — ABNORMAL LOW (ref 12–28)
BUN: 5 mg/dL — ABNORMAL LOW (ref 8–27)
Bilirubin Total: <0.2 mg/dL (ref 0.0–1.2)
CO2: 23 mmol/L (ref 20–29)
Calcium: 8.9 mg/dL (ref 8.7–10.3)
Chloride: 103 mmol/L (ref 96–106)
Creatinine, Ser: 0.88 mg/dL (ref 0.57–1.00)
Globulin, Total: 2.9 g/dL (ref 1.5–4.5)
Glucose: 82 mg/dL (ref 70–99)
Potassium: 3.6 mmol/L (ref 3.5–5.2)
Sodium: 140 mmol/L (ref 134–144)
Total Protein: 6.9 g/dL (ref 6.0–8.5)
eGFR: 74 mL/min/{1.73_m2} (ref >59)

## 2023-11-12 LAB — CBC WITH DIFFERENTIAL/PLATELET
Basophils Absolute: 0.1 10*3/uL (ref 0.0–0.2)
Basos: 1 %
EOS (ABSOLUTE): 0.1 10*3/uL (ref 0.0–0.4)
Eos: 2 %
Hematocrit: 25.8 % — ABNORMAL LOW (ref 34.0–46.6)
Hemoglobin: 7.1 g/dL — ABNORMAL LOW (ref 11.1–15.9)
Immature Grans (Abs): 0 10*3/uL (ref 0.0–0.1)
Immature Granulocytes: 0 %
Lymphocytes Absolute: 2.6 10*3/uL (ref 0.7–3.1)
Lymphs: 32 %
MCH: 19.5 pg — ABNORMAL LOW (ref 26.6–33.0)
MCHC: 27.5 g/dL — ABNORMAL LOW (ref 31.5–35.7)
MCV: 71 fL — ABNORMAL LOW (ref 79–97)
Monocytes Absolute: 0.7 10*3/uL (ref 0.1–0.9)
Monocytes: 8 %
Neutrophils Absolute: 4.6 10*3/uL (ref 1.4–7.0)
Neutrophils: 57 %
Platelets: 316 10*3/uL (ref 150–450)
RBC: 3.65 x10E6/uL — ABNORMAL LOW (ref 3.77–5.28)
RDW: 19.2 % — ABNORMAL HIGH (ref 11.7–15.4)
WBC: 8.2 10*3/uL (ref 3.4–10.8)

## 2023-11-12 LAB — LIPID PANEL
Chol/HDL Ratio: 2.1 ratio (ref 0.0–4.4)
Cholesterol, Total: 120 mg/dL (ref 100–199)
HDL: 56 mg/dL (ref >39)
LDL Chol Calc (NIH): 45 mg/dL (ref 0–99)
Triglycerides: 102 mg/dL (ref 0–149)
VLDL Cholesterol Cal: 19 mg/dL (ref 5–40)

## 2023-11-12 LAB — TSH+FREE T4
Free T4: 1.1 ng/dL (ref 0.82–1.77)
TSH: 3.43 u[IU]/mL (ref 0.450–4.500)

## 2023-11-12 LAB — HEMOGLOBIN A1C
Est. average glucose Bld gHb Est-mCnc: 111 mg/dL
Hgb A1c MFr Bld: 5.5 % (ref 4.8–5.6)

## 2023-11-12 LAB — VITAMIN D 25 HYDROXY (VIT D DEFICIENCY, FRACTURES): Vit D, 25-Hydroxy: 15 ng/mL — ABNORMAL LOW (ref 30.0–100.0)

## 2023-11-12 LAB — VITAMIN B12: Vitamin B-12: 386 pg/mL (ref 232–1245)

## 2023-11-13 ENCOUNTER — Other Ambulatory Visit: Payer: Self-pay | Admitting: *Deleted

## 2023-11-13 ENCOUNTER — Other Ambulatory Visit: Payer: Self-pay | Admitting: Family Medicine

## 2023-11-13 ENCOUNTER — Ambulatory Visit: Admitting: Internal Medicine

## 2023-11-13 ENCOUNTER — Encounter: Payer: Self-pay | Admitting: Internal Medicine

## 2023-11-13 VITALS — BP 127/58 | HR 67 | Ht 60.0 in | Wt 154.0 lb

## 2023-11-13 DIAGNOSIS — C159 Malignant neoplasm of esophagus, unspecified: Secondary | ICD-10-CM

## 2023-11-13 DIAGNOSIS — D649 Anemia, unspecified: Secondary | ICD-10-CM

## 2023-11-13 DIAGNOSIS — F1091 Alcohol use, unspecified, in remission: Secondary | ICD-10-CM | POA: Diagnosis not present

## 2023-11-13 DIAGNOSIS — K219 Gastro-esophageal reflux disease without esophagitis: Secondary | ICD-10-CM

## 2023-11-13 DIAGNOSIS — Z8501 Personal history of malignant neoplasm of esophagus: Secondary | ICD-10-CM

## 2023-11-13 DIAGNOSIS — K269 Duodenal ulcer, unspecified as acute or chronic, without hemorrhage or perforation: Secondary | ICD-10-CM

## 2023-11-13 DIAGNOSIS — K581 Irritable bowel syndrome with constipation: Secondary | ICD-10-CM

## 2023-11-13 DIAGNOSIS — K7030 Alcoholic cirrhosis of liver without ascites: Secondary | ICD-10-CM

## 2023-11-13 DIAGNOSIS — F17210 Nicotine dependence, cigarettes, uncomplicated: Secondary | ICD-10-CM

## 2023-11-13 NOTE — Progress Notes (Signed)
 Please inform patient,   Referral was placed to hematology for anemia.  Vitamin D  levels low, I advise to taking  over the counter supplements of vitamin D  1000 IU/day to prevent low vitamin D  levels.  Consume Vitamin D -Rich Foods: Fatty Fish: Salmon, mackerel, tuna, and sardines. Egg Yolks: A good source if eaten whole. Fortified Foods: Milk, orange juice, cereals, and plant-based milks (like almond or soy milk). Mushrooms: Especially those exposed to sunlight or UV light. Cod Liver Oil: A concentrated source of vitamin D . Including these foods in your diet can help boost vitamin D  levels

## 2023-11-13 NOTE — Patient Instructions (Signed)
 I am going to check iron studies at [facility] lab today.  Will call with results.  I am also going to refer you to hematology for your worsening anemia.  Follow-up with me in 6 weeks.  It is always a pleasure seeing you.  Dr. Mordechai April

## 2023-11-13 NOTE — Progress Notes (Signed)
 Referring Provider: No ref. provider found Primary Care Physician:  Rosanna Comment, FNP Primary GI:  Dr. Mordechai April  Chief Complaint  Patient presents with   Gastroesophageal Reflux    Follow up on GERD. Has trouble swallowing pills. Takes protonix  40mg  one twice daily.    Cirrhosis    Follow up on cirrhosis.     HPI:   Regina Richardson is a 63 y.o. female who presents to the clinic today for follow up visit. She has a history of GERD, squamous cell carcinoma of the esophagus resected at [facility], alcoholic cirrhosis.   Cirrhosis: initially seen in our clinic October 2021. Work-up in included hemochromatosis DNA negative for C282Y and H63D, ferritin returned to normal at 91, alk phos 195, AST 66, ALT 31, ASMA negative, ANA negative, ANA negative, IgG elevated at 1702, IgA elevated at 783.  Suspected IgA and IgG elevation may be secondary to alcohol.  AFP 3.5. Hepatitis C antibody negative, hepatitis B surface antigen negative.  Advise she continue to work towards alcohol cessation.  She was lost to follow-up.   Hospitalization at [facility] October 2023 after presenting with abdominal pain. CT A/P with contrast showing moderately severe duodenitis, hepatic cirrhosis, stable benign left adrenal adenoma. Gastrin level 294, not felt to be significantly elevated in the range of a gastrinoma, plan for repeat testing off PPI if possible as an outpatient. MELD Na of 9 while inpatient.   EGD April 27, 2022: - Scar in the middle third of the esophagus. - Mucosal nodule found in the esophagus. Biopsied. - Mucous like gastric fluid. Fluid aspiration performed. Normal stomach biopsied. - Multiple non-bleeding duodenal ulcers with a clean ulcer base (Forrest Class III). - Duodenitis -Gastric biopsy showed reactive gastropathy. -Esophageal nodule biopsy showed focal benign gastric mucosa consistent with inlet patch, squamous mucosa.  Had repeat lab work done February 2024.  Alk phos 173, AST 41, ALT 32,  T. bili 0.2, negative AMA, negative ANA, negative smooth muscle, IgG 1789, IgA 688. H pylori stool Ag negative. Repeat gastrin level WNL.  MELD 3.0 of 8  No history of hepatic encephalopathy.  No history of portal hypertension, ascites, esophageal varices.  Duodenal ulcers/chronic GERD-abdominal pain has resolved.  H. pylori stool antigen negative.  Gastrin level WNL.  GERD well-controlled.    EGD 07/15/2023 with scar in the middle third of the esophagus, normal stomach, normal duodenum.  Anemia: Recent blood work by PCP 10/29/2023 with hemoglobin of 7.1, MCV 71.  Baseline around 9-10.  Denies any gross melena or hematochezia.  Pertinent procedure history:   Colonoscopy February 2019 with Dr. Sandrea Cruel with [facility] for abnormal cecum on CT. Findings included prominent and moderately lipomatous ileocecal valve, 9 mm sessile polyp in hepatic flexure, 8 mm sessile polyp in the descending colon, otherwise normal exam.  Pathology with sessile serrated polyp and hyperplastic polyp. Recommended repeat colonoscopy in 5 years.   EGD/colonoscopy in February 2019 for anemia and epigastric abdominal pain.  She was found to have LA grade B esophagitis without bleeding s/p biopsied, diffuse moderate inflammation and granularity in the entire examined stomach s/p biopsied, normal examined duodenum s/p biopsy. Gastric biopsy with chronic inactive gastritis without H. pylori, esophageal biopsy with mildly inflamed squamous mucosa, duodenal biopsy benign. H.pylori breast test positive 07/2017. Patient denies treatment at that time.   Procedures 06/15/2020 performed by Dr. Homero Luster: Colonoscopy: 2 polyps resected and retrieved, 1 small polyp in the distal sigmoid biopsy, external hemorrhoids.  Pathology with 1  tubular adenoma and 1 hyperplastic polyp.  EGD: Proximal esophageal web, focal esophagitis at proximal esophagus s/p biopsy, benign-appearing esophageal stenosis s/p dilated, portal hypertensive gastropathy.   Esophageal biopsy with moderate squamous dysplasia.  Dr. Homero Luster recommended repeat EGD in about 8 weeks.   EGD March 2022: Eroded, inflamed mucosa in the esophagus, portal hypertension gastropathy, esophageal biopsy showed moderate to severe squamous dysplasia    EGD/EUS with Dr. Brice Campi May 2022:  Floyd Hutchinson (with contact bleeding), granular, smooth, texture changed mucosa in the esophagus at 23-26 cm was encountered. Under NBI imaging and then spraying with Lugol's stain I could visualize a lesion that was 3 cm in length and encompassed 30-40% of the esophageal wall. Biopsied quickly but not as in depth as I would have like due to increased coughing and also 2 prior EGDs with Squamous dysplasia already being noted. - Erythematous mucosa in the stomach. Biopsied. - Possible mild portal hypertensive gastropathy in proximal stomach (though again, normal spleen size and only hepatomegaly appreciated on most recent CT). - Endosonographic imaging in the esophagus showed no wall thickening throughout. - One benign lymph node was visualized in the middle paraesophageal mediastinum (level 67M). Tissue has not been obtained. However, the endosonographic appearance is suggestive of benign inflammatory changes. - Pancreatic parenchymal abnormalities consisting of lobularity were noted in the pancreatic    Biopsy showed mild chronic gastric inflammation, negative for H. pylori, distal esophageal biopsy with slight inflammation, biopsies from 23 to 36 cm showed moderate to severe squamous dysplasia.   EGD August 2022: At [facility].    Impression: - Two large plaques in the prox - middle third of the  esophagus. Clips (MR conditional) were placed. - Normal stomach. - Normal examined duodenum. - Endoscopic submucosal dissection was performed.  Resection and retrieval were complete. -Two foci of squamous cell carcinoma in situ, focally extending to the peripheral inked margin.No invasive carcinoma is  seen.Multiple levels are examined   EGD October 2022: At [facility].   Impression: - Scar in the proximal esophagus and in the mid  esophagus. Biopsied. Dilated. - Normal stomach. - Normal examined duodenum. -if path normal, repeat EGD in 1 year -path showed Esophageal squamous mucosa with reactive change. Negative for dysplasia or carcinoma.  Past Medical History:  Diagnosis Date   Alcoholism (HCC)    Anxiety    Apnea 06/11/2021   Arrhythmia    heart   Arthritis    Cancer (HCC)    cervical cancer in the 80s   Cirrhosis (HCC) 04/2020   alcoholic;    Depression    Dysrhythmia    GERD (gastroesophageal reflux disease)    Hypertension    Osteoporosis    PTSD (post-traumatic stress disorder)    Scoliosis    Urinary incontinence     Past Surgical History:  Procedure Laterality Date   BIOPSY  10/11/2020   Procedure: BIOPSY;  Surgeon: Vinetta Greening, DO;  Location: [facility];  Service: Endoscopy;;   BIOPSY  12/12/2020   Procedure: BIOPSY;  Surgeon: Normie Becton., MD;  Location: [facility];  Service: Gastroenterology;;   BIOPSY  04/27/2022   Procedure: BIOPSY;  Surgeon: Umberto Ganong, Bearl Limes, MD;  Location: [facility];  Service: Gastroenterology;;   CERVICAL BIOPSY  W/ LOOP ELECTRODE EXCISION     CESAREAN SECTION     COLONOSCOPY  08/2017   Dr. Sandrea Cruel;  prominent and moderately lipomatous ileocecal valve, 9 mm sessile polyp in hepatic flexure, 8 mm sessile polyp in the descending  colon, otherwise normal exam.  Pathology with sessile serrated polyp and hyperplastic polyp.  Recommended repeat colonoscopy in 5 years.   COLONOSCOPY N/A 06/15/2020   Procedure: COLONOSCOPY;  Surgeon: Ruby Corporal, MD; 2 polyps resected and retrieved, 1 small polyp in the distal sigmoid biopsy, external hemorrhoids.  Pathology with 1 tubular adenoma and 1 hyperplastic polyp.    COLPOSCOPY     EGD with mucosal resection  03/16/2021   [facility]: 2 large plaques in the proximal-mid third  of the esophagus s/p endoscopic submucosal resection and retrieval, placement of 4 MR conditional clips.  Normal stomach, normal examined duodenum.  Pathology revealed squamous cell carcinoma in situ focally extending to the peripheral inked margin.  No invasive carcinoma. Recommended repeat EGD in 3 weeks.   ESOPHAGEAL DILATION N/A 06/15/2020   Procedure: ESOPHAGEAL DILATION;  Surgeon: Ruby Corporal, MD;  Location: [facility];  Service: Endoscopy;  Laterality: N/A;   ESOPHAGOGASTRODUODENOSCOPY  08/2017   Dr. Sandrea Cruel; LA grade B esophagitis without bleeding s/p biopsied, diffuse moderate inflammation and granularity in the entire examined stomach s/p biopsied, normal examined duodenum s/p biopsy. Gastric biopsy with chronic inactive gastritis without H. pylori, esophageal biopsy with mildly inflamed squamous mucosa, duodenal biopsy benign.   ESOPHAGOGASTRODUODENOSCOPY N/A 06/15/2020   Procedure: ESOPHAGOGASTRODUODENOSCOPY (EGD);  Surgeon: Ruby Corporal, MD;  Proximal esophageal web, focal esophagitis at proximal esophagus s/p biopsy, benign-appearing esophageal stenosis s/p dilated, portal hypertensive gastropathy.  Esophageal biopsy with moderate squamous dysplasia.     ESOPHAGOGASTRODUODENOSCOPY (EGD) WITH PROPOFOL  N/A 10/11/2020   Surgeon: Vinetta Greening, DO; Eroded, inflamed mucosa in the esophagus. Biopsied. Portal hypertensive gastropathy.  Recommended PPI twice daily.  Pathology with moderate to severe squamous dysplasia.   ESOPHAGOGASTRODUODENOSCOPY (EGD) WITH PROPOFOL  N/A 12/12/2020   Surgeon: Normie Becton., MD; Floyd Hutchinson, granular, mucosa in the esophagus at 23-26 cm.  Lesion 3 cm in length and encompassed 30-40% of esophageal wall s/p quick biopsy.  Erythematous mucosa in the stomach biopsied, portal hypertensive gastropathy.  Gastric biopsy with mild chronic inflammation, negative for H. pylori.  Esophageal biopsy with moderate to severe squamous dysplasia.    ESOPHAGOGASTRODUODENOSCOPY (EGD) WITH PROPOFOL  N/A 04/27/2022   Procedure: ESOPHAGOGASTRODUODENOSCOPY (EGD) WITH PROPOFOL ;  Surgeon: Urban Garden, MD;  Location: [facility];  Service: Gastroenterology;  Laterality: N/A;   FINGER SURGERY     HEMORRHOID SURGERY     left collar bone surgery     had fx, pin placed and then complitcations caused them to remove most of the bone   left forearm surgery     from left elbow down   TOTAL ABDOMINAL HYSTERECTOMY     still has ovaries   UPPER ESOPHAGEAL ENDOSCOPIC ULTRASOUND (EUS) N/A 12/12/2020   Surgeon: Normie Becton., MD;without esophageal wall thickening, 1 lymph node in the middle paraesophageal mediastinum that was suggestive of benign inflammatory changes, pancreatic lobularity in pancreatic body, queried possibility of chronic pancreatitis though complete EUS not performed and no history of pancreatitis.    Current Outpatient Medications  Medication Sig Dispense Refill   allopurinol  (ZYLOPRIM ) 300 MG tablet Take 1 tablet (300 mg total) by mouth daily. 90 tablet 3   budesonide -formoterol  (SYMBICORT ) 160-4.5 MCG/ACT inhaler Inhale 2 puffs into the lungs 2 (two) times daily. 1 each 5   colchicine  0.6 MG tablet Take 1 tablet (0.6 mg total) by mouth 2 (two) times daily for 5 days. For gout flare up 10 tablet 0   diclofenac  (VOLTAREN ) 75 MG EC tablet Take 1  tablet (75 mg total) by mouth 2 (two) times daily. 60 tablet 2   DULoxetine  (CYMBALTA ) 60 MG capsule Take 2 capsules (120 mg total) by mouth daily. 180 capsule 1   folic acid  (FOLVITE ) 1 MG tablet Take 1 tablet (1 mg total) by mouth daily. 90 tablet 3   gabapentin  (NEURONTIN ) 300 MG capsule Take 1 capsule (300 mg total) by mouth at bedtime. 30 capsule 3   methocarbamol  (ROBAXIN ) 500 MG tablet Take 1 tablet (500 mg total) by mouth every 8 (eight) hours as needed for muscle spasms. 90 tablet 5   metoprolol  succinate (TOPROL -XL) 100 MG 24 hr tablet Take 1 tablet (100 mg total) by  mouth daily. 90 tablet 3   pantoprazole  (PROTONIX ) 40 MG tablet Take 1 tablet (40 mg total) by mouth 2 (two) times daily. 60 tablet 11   QUEtiapine  (SEROQUEL ) 100 MG tablet TAKE 1 TABLET BY MOUTH EVERYDAY AT BEDTIME 90 tablet 1   rosuvastatin  (CRESTOR ) 20 MG tablet Take 1 tablet (20 mg total) by mouth daily. 90 tablet 3   umeclidinium-vilanterol (ANORO ELLIPTA ) 62.5-25 MCG/ACT AEPB Inhale 1 puff into the lungs in the morning.     No current facility-administered medications for this visit.    Allergies as of 11/13/2023   (No Known Allergies)    Family History  Problem Relation Age of Onset   Alcohol abuse Mother    Liver disease Mother        alcoholic cirrhosis   Heart disease Father    Alcohol abuse Father    Lung cancer Father    Diabetes Sister    Cirrhosis Brother        secondary to alcohol.    Liver disease Brother    Liver disease Sister        Alcoholic cirrhosis   Diabetes Maternal Aunt    Kidney disease Maternal Aunt    Diabetes Cousin        mat cousin   Breast cancer Neg Hx    Colon cancer Neg Hx    Esophageal cancer Neg Hx    Stomach cancer Neg Hx    Rectal cancer Neg Hx    Pancreatic cancer Neg Hx    Inflammatory bowel disease Neg Hx     Social History   Socioeconomic History   Marital status: Divorced    Spouse name: Not on file   Number of children: 1   Years of education: 12   Highest education level: Associate degree: occupational, Scientist, product/process development, or vocational program  Occupational History   Occupation: retired  Tobacco Use   Smoking status: Every Day    Current packs/day: 0.50    Average packs/day: 0.5 packs/day for 40.0 years (20.0 ttl pk-yrs)    Types: Cigarettes    Passive exposure: Current   Smokeless tobacco: Never   Tobacco comments:    Smoking Cessation Offered.  Vaping Use   Vaping status: Never Used  Substance and Sexual Activity   Alcohol use: Not Currently    Alcohol/week: 12.0 standard drinks of alcohol    Types: 12 Cans of  beer per week    Comment: 12 cans of beer daily   Drug use: No    Comment: Tried cocaine and marijuana in the 60s.    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    Moved back to [facility]  from [place]   Social Drivers of Health   Financial Resource Strain: High Risk (09/08/2023)   Overall Financial Resource Strain (CARDIA)    Difficulty of Paying Living Expenses: Very hard  Food Insecurity: Food Insecurity Present (09/08/2023)   Hunger Vital Sign    Worried About Running Out of Food in the Last Year: Often true    Ran Out of Food in the Last Year: Sometimes true  Transportation Needs: Unmet Transportation Needs (09/08/2023)   PRAPARE - Administrator, Civil Service (Medical): Yes    Lack of Transportation (Non-Medical): No  Physical Activity: Unknown (09/08/2023)   Exercise Vital Sign    Days of Exercise per Week: 0 days    Minutes of Exercise per Session: Not on file  Stress: Stress Concern Present (09/08/2023)   [employer] of Occupational Health - Occupational Stress Questionnaire    Feeling of Stress : Very much  Social Connections: Socially Isolated (09/08/2023)   Social Connection and Isolation Panel [NHANES]    Frequency of Communication with Friends and Family: Once a week    Frequency of Social Gatherings with Friends and Family: Never    Attends Religious Services: Never    Database administrator or Organizations: No    Attends Engineer, structural: Not on file    Marital Status: Widowed    Subjective: Review of Systems  Constitutional:  Negative for chills and fever.  HENT:  Negative for congestion and hearing loss.   Eyes:  Negative for blurred vision and double vision.  Respiratory:  Negative for cough and shortness of breath.   Cardiovascular:  Negative for chest pain and palpitations.  Gastrointestinal:  Negative for  abdominal pain, blood in stool, constipation, diarrhea, heartburn, melena and vomiting.  Genitourinary:  Negative for dysuria and urgency.  Musculoskeletal:  Negative for joint pain and myalgias.  Skin:  Negative for itching and rash.  Neurological:  Negative for dizziness and headaches.  Psychiatric/Behavioral:  Negative for depression. The patient is not nervous/anxious.      Objective: BP (!) 127/58   Pulse 67   Ht 5' (1.524 m)   Wt 154 lb (69.9 kg)   BMI 30.08 kg/m  Physical Exam Constitutional:      Appearance: Normal appearance.  HENT:     Head: Normocephalic and atraumatic.  Eyes:     Extraocular Movements: Extraocular movements intact.     Conjunctiva/sclera: Conjunctivae normal.  Cardiovascular:     Rate and Rhythm: Normal rate and regular rhythm.  Pulmonary:     Effort: Pulmonary effort is normal.     Breath sounds: Normal breath sounds.  Abdominal:     General: Bowel sounds are normal.     Palpations: Abdomen is soft.  Musculoskeletal:        General: No swelling. Normal range of motion.     Cervical back: Normal range of motion and neck supple.  Skin:    General: Skin is warm and dry.     Coloration: Skin is not jaundiced.  Neurological:     General: No focal deficit present.     Mental Status: She is alert and oriented to person, place, and time.  Psychiatric:        Mood and Affect: Mood normal.        Behavior: Behavior normal.      Assessment/Plan:  1.  Alcohol cirrhosis-appears well compensated.  MELD 3.0 of 8.  No complications today.  LFTs have normalized.  Continue alcohol  cessation (sober since October 2023)  Due for US  for [facility] screening.   2.  Duodenal ulcers/chronic GERD-abdominal pain has resolved.  H. pylori stool antigen negative.  Gastrin level WNL.  No complaints today.  Continue to avoid all NSAIDs.  Continue pantoprazole  twice daily.    3.  Squamous cell carcinoma of esophagus-continue with regular surveillance EGDs.  4.   Anemia-worsening.  Will refer to hematology.  Will check iron  studies.  Likely will need to update colonoscopy.  5.  Chronic alcohol use-sober since October 23.  Congratulated her on this.  Continue all alcohol cessation.  Follow-up in 6 weeks   11/13/2023 2:10 PM   Disclaimer: This note was dictated with voice recognition software. Similar sounding words can inadvertently be transcribed and may not be corrected upon review.

## 2023-11-14 LAB — IRON,TIBC AND FERRITIN PANEL
%SAT: 4 % — ABNORMAL LOW (ref 16–45)
Ferritin: 3 ng/mL — ABNORMAL LOW (ref 16–288)
Iron: 22 ug/dL — ABNORMAL LOW (ref 45–160)
TIBC: 532 ug/dL — ABNORMAL HIGH (ref 250–450)

## 2023-11-14 LAB — PROTIME-INR
INR: 1.1
Prothrombin Time: 11.4 s (ref 9.0–11.5)

## 2023-11-21 ENCOUNTER — Inpatient Hospital Stay

## 2023-11-21 ENCOUNTER — Other Ambulatory Visit: Payer: Self-pay

## 2023-11-21 ENCOUNTER — Inpatient Hospital Stay: Attending: Oncology | Admitting: Oncology

## 2023-11-21 VITALS — BP 126/54 | HR 63 | Temp 97.2°F | Resp 20 | Ht 61.22 in | Wt 156.2 lb

## 2023-11-21 DIAGNOSIS — D500 Iron deficiency anemia secondary to blood loss (chronic): Secondary | ICD-10-CM

## 2023-11-21 DIAGNOSIS — K7030 Alcoholic cirrhosis of liver without ascites: Secondary | ICD-10-CM | POA: Diagnosis not present

## 2023-11-21 DIAGNOSIS — Z8501 Personal history of malignant neoplasm of esophagus: Secondary | ICD-10-CM | POA: Insufficient documentation

## 2023-11-21 DIAGNOSIS — D509 Iron deficiency anemia, unspecified: Secondary | ICD-10-CM | POA: Insufficient documentation

## 2023-11-21 DIAGNOSIS — F17210 Nicotine dependence, cigarettes, uncomplicated: Secondary | ICD-10-CM | POA: Diagnosis not present

## 2023-11-21 DIAGNOSIS — C159 Malignant neoplasm of esophagus, unspecified: Secondary | ICD-10-CM

## 2023-11-21 MED ORDER — FERROUS SULFATE 325 (65 FE) MG PO TBEC: 325.0000 mg | DELAYED_RELEASE_TABLET | ORAL | 3 refills | Status: AC

## 2023-11-21 NOTE — Assessment & Plan Note (Signed)
 The most likely cause of her anemia is due to chronic blood loss.  Lab Results  Component Value Date   IRON 22 (L) 11/13/2023   TIBC 532 (H) 11/13/2023   FERRITIN 3 (L) 11/13/2023    Last colonoscopy/endoscopy: - Endoscopy: 06/2023: Showing scar in the middle third of the esophagus - Colonoscopy: 05/2020: 3 small polyps were identified and external hemorrhoids.  No active bleeding.   -We discussed some of the risks, benefits, and alternatives of intravenous iron infusions. The patient is symptomatic from anemia and the iron level is critically low. She desires to achieve higher levels of iron faster for adequate hematopoesis. Some of the side-effects to be expected including risks of infusion reactions, phlebitis, headaches, nausea and fatigue.  The patient is willing to proceed. Patient education material was dispensed. Goal is to keep ferritin level greater than 50 and resolution of anemia -Start oral iron every other day.  Use MiraLAX for constipation  Return to clinic in 6 weeks with labs to assess response to IV iron

## 2023-11-21 NOTE — Patient Instructions (Signed)
 VISIT SUMMARY:  Today, you were seen for severe anemia and symptoms related to your history of esophageal cancer. You reported persistent fatigue, shortness of breath, difficulty swallowing, and pain when eating solid foods. Your hemoglobin level is significantly low at 7 g/dL. We discussed your symptoms and planned the next steps for your care.  YOUR PLAN:  -ESOPHAGEAL CANCER: Esophageal cancer is a type of cancer that occurs in the esophagus, the tube that carries food from your throat to your stomach. You had surgery for this in 2022. Given your current symptoms, we need to investigate if there is a recurrence or any complications. We will coordinate with Dr. Mordechai April for further evaluation and management.  -DYSPHAGIA: Dysphagia means difficulty swallowing. This is likely related to your esophageal cancer. We will coordinate with Dr. Mordechai April to further evaluate this issue.  -IRON DEFICIENCY ANEMIA: Iron deficiency anemia is a condition where your body lacks enough iron to produce healthy red blood cells, leading to fatigue and weakness. Your hemoglobin level is very low. We will administer two doses of IV iron at the infusion center and prescribe oral iron supplements. Be aware that oral iron can cause constipation, so you may use Miralax if needed. We will recheck your iron levels in six weeks. IV iron can have side effects like infusion reactions, headaches, and nausea, so please let us  know if you experience any of these.  INSTRUCTIONS:  We will coordinate with Dr. Mordechai April for further evaluation and management of your esophageal cancer and dysphagia. Please schedule an appointment at the infusion center for your IV iron doses. Take the prescribed oral iron supplements as directed and use Miralax if you experience constipation. We will recheck your iron levels in six weeks.

## 2023-11-21 NOTE — Progress Notes (Signed)
 Patient needs a office visit to discuss sleep disturbance    Patient Medication List Allopurinol  (Zyloprim ) 300 mg tablet  Dosage: Take 1 tablet (300 mg) by mouth daily.  Reason: To manage gout and prevent uric acid buildup.  Budesonide -Formoterol  (Symbicort ) 160-4.5 mcg/act inhaler  Dosage: Inhale 2 puffs into the lungs, 2 times daily.  Reason: To control asthma or chronic obstructive pulmonary disease (COPD) symptoms.  Colchicine  0.6 mg tablet  Dosage: Take 1 tablet (0.6 mg) by mouth, 2 times daily for 5 days (for gout flare-up).  Reason: To treat acute gout flare-ups and reduce inflammation.  Diclofenac  (Voltaren ) 75 mg EC tablet  Dosage: Take 1 tablet (75 mg) by mouth, 2 times daily.  Reason: To reduce inflammation and manage pain associated with conditions such as osteoarthritis or musculoskeletal pain.  Duloxetine  (Cymbalta ) 60 mg capsule  Dosage: Take 2 capsules (120 mg total) by mouth daily.  Reason: To manage depression, anxiety, and chronic pain, including fibromyalgia.  Ferrous Sulfate  325 mg (65 mg Fe) EC tablet  Dosage: Take 1 tablet (325 mg) by mouth every other day.  Reason: To treat iron-deficiency anemia.  Folic Acid  (Folvite ) 1 mg tablet  Dosage: Take 1 tablet (1 mg) by mouth daily.  Reason: To prevent or treat folate deficiency and support cell growth during pregnancy.  Gabapentin  (Neurontin ) 300 mg capsule  Dosage: Take 1 capsule (300 mg) by mouth at bedtime.  Reason: To manage nerve pain and seizures.  Methocarbamol  (Robaxin ) 500 mg tablet  Dosage: Take 1 tablet (500 mg) by mouth every 8 hours as needed for muscle spasms.  Reason: To relieve muscle spasms and associated pain.  Metoprolol  Succinate (Toprol -XL) 100 mg 24-hour tablet  Dosage: Take 1 tablet (100 mg) by mouth daily.  Reason: To manage high blood pressure and heart-related conditions, such as heart failure or arrhythmias.  Pantoprazole  (Protonix ) 40 mg tablet  Dosage:  Take 1 tablet (40 mg) by mouth, 2 times daily.  Reason: To treat gastroesophageal reflux disease (GERD) and prevent stomach ulcers.  Quetiapine  (Seroquel ) 100 mg tablet  Dosage: Take 1 tablet by mouth every day at bedtime.  Reason: To manage symptoms of schizophrenia, bipolar disorder, and major depressive disorder.  Rosuvastatin  (Crestor ) 20 mg tablet  Dosage: Take 1 tablet (20 mg) by mouth daily.  Reason: To lower cholesterol levels and reduce the risk of heart disease.  Umeclidinium-Vilanterol (Anoro Ellipta ) 62.5-25 mcg/act AEPB  Dosage: Inhale 1 puff into the lungs every morning.  Reason: To manage chronic obstructive pulmonary disease (COPD) symptoms.

## 2023-11-21 NOTE — Assessment & Plan Note (Addendum)
 Patient had a history of esophageal squamous cell carcinoma in situ with no invasive disease S/p mucosal endoscopic dissection in 02/2021 Getting frequent endoscopies with GI  - Continue to follow with GI

## 2023-11-21 NOTE — Progress Notes (Signed)
 [facility] at [facility]  HEMATOLOGY NEW VISIT  [name], [place], [place]  REASON FOR REFERRAL: Iron deficiency anemia  HISTORY OF PRESENT ILLNESS: Regina Richardson 63 y.o. female referred for iron deficiency anemia.  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  She was recently seen by Dr. Mordechai April for EGD that she frequently does for her previous history of squamous cell carcinoma in situ of esophagus.  She had lab work done which showed significant iron deficiency anemia.  She is here for IV iron.  Today, the patient reports persistent fatigue and shortness of breath.  She has severe pica craving for ice all the time.  She has dysphagia from previous esophageal surgery that did not get better even with stricture dilatation.  She does have good appetite, is not losing weight.  No blood is seen in her stools, but does report blood on toilet paper.  She occasionally experiences abdominal pain but no nausea or vomiting.    She is a smoker, reduce smoking to 4 to 5 cigarettes a day.  She has been not drinking alcohol since October 2023.  I have reviewed the past medical history, past surgical history, social history and family history with the patient   ALLERGIES:  has no known allergies.  MEDICATIONS:  Current Outpatient Medications  Medication Sig Dispense Refill   allopurinol  (ZYLOPRIM ) 300 MG tablet Take 1 tablet (300 mg total) by mouth daily. 90 tablet 3   budesonide -formoterol  (SYMBICORT ) 160-4.5 MCG/ACT inhaler Inhale 2 puffs into the lungs 2 (two) times daily. 1 each 5   diclofenac  (VOLTAREN ) 75 MG EC tablet Take 1 tablet (75 mg total) by mouth 2 (two) times daily. 60 tablet 2   DULoxetine  (CYMBALTA ) 60 MG capsule Take 2 capsules (120 mg total) by mouth daily. 180 capsule 1   ferrous sulfate  325 (65 FE) MG EC tablet Take 1 tablet (325 mg total) by mouth every  other day. 45 tablet 3   folic acid  (FOLVITE ) 1 MG tablet Take 1 tablet (1 mg total) by mouth daily. 90 tablet 3   gabapentin  (NEURONTIN ) 300 MG capsule Take 1 capsule (300 mg total) by mouth at bedtime. 30 capsule 3   methocarbamol  (ROBAXIN ) 500 MG tablet Take 1 tablet (500 mg total) by mouth every 8 (eight) hours as needed for muscle spasms. 90 tablet 5   metoprolol  succinate (TOPROL -XL) 100 MG 24 hr tablet Take 1 tablet (100 mg total) by mouth daily. 90 tablet 3   pantoprazole  (PROTONIX ) 40 MG tablet Take 1 tablet (40 mg total) by mouth 2 (two) times daily. 60 tablet 11   QUEtiapine  (SEROQUEL ) 100 MG tablet TAKE 1 TABLET BY MOUTH EVERYDAY AT BEDTIME 90 tablet 1   rosuvastatin  (CRESTOR ) 20 MG tablet Take 1 tablet (20 mg total) by mouth daily. 90 tablet 3   umeclidinium-vilanterol (ANORO ELLIPTA ) 62.5-25 MCG/ACT AEPB Inhale 1 puff into the lungs in the morning.     colchicine  0.6 MG tablet Take 1 tablet (0.6 mg total) by mouth 2 (two) times daily for 5 days. For gout flare up 10 tablet 0   No current facility-administered medications for this visit.     REVIEW OF SYSTEMS:   Constitutional: Denies fevers, chills or night sweats Eyes: Denies blurriness of vision Ears, nose, mouth, throat, and face: Denies mucositis or sore throat Respiratory: Denies cough, dyspnea or wheezes Cardiovascular: Denies palpitation, chest  discomfort or lower extremity swelling Gastrointestinal:  Denies nausea, heartburn or change in bowel habits Skin: Denies abnormal skin rashes Lymphatics: Denies new lymphadenopathy or easy bruising Neurological:Denies numbness, tingling or new weaknesses Behavioral/Psych: Mood is stable, no new changes  All other systems were reviewed with the patient and are negative.  PHYSICAL EXAMINATION:   Vitals:   11/21/23 0817  BP: (!) 126/54  Pulse: 63  Resp: 20  Temp: (!) 97.2 F (36.2 C)  SpO2: 100%    GENERAL:alert, no distress and comfortable LUNGS: clear to  auscultation and percussion with normal breathing effort HEART: regular rate & rhythm and no murmurs and no lower extremity edema ABDOMEN:abdomen soft, non-tender and normal bowel sounds Musculoskeletal:no cyanosis of digits and no clubbing  NEURO: alert & oriented x 3 with fluent speech  LABORATORY DATA:  I have reviewed the data as listed  Lab Results  Component Value Date   WBC 8.2 11/08/2023   NEUTROABS 4.6 11/08/2023   HGB 7.1 (L) 11/08/2023   HCT 25.8 (L) 11/08/2023   MCV 71 (L) 11/08/2023   PLT 316 11/08/2023       Chemistry      Component Value Date/Time   NA 140 11/08/2023 1115   K 3.6 11/08/2023 1115   CL 103 11/08/2023 1115   CO2 23 11/08/2023 1115   BUN 5 (L) 11/08/2023 1115   CREATININE 0.88 11/08/2023 1115   CREATININE 0.43 (L) 05/04/2019 1108      Component Value Date/Time   CALCIUM  8.9 11/08/2023 1115   ALKPHOS 196 (H) 11/08/2023 1115   AST 40 11/08/2023 1115   ALT 21 11/08/2023 1115   BILITOT <0.2 11/08/2023 1115      Latest Reference Range & Units 11/13/23 14:52  Iron 45 - 160 mcg/dL 22 (L)  TIBC 409 - 811 mcg/dL (calc) 914 (H)  %SAT 16 - 45 % (calc) 4 (L)  Ferritin 16 - 288 ng/mL 3 (L)  (L): Data is abnormally low (H): Data is abnormally high  Upper endoscopy: 07/15/2023: Impression:  - Scar in the middle third of the esophagus.  - Normal examined duodenum. - Z- line regular, 39 cm from the incisors.  - Normal stomach.   - Normal duodenal bulb, first portion of the duodenum and second portion of the duodenum.  - No specimens collected.  Colonoscopy: 05/2020: Impression:  - Two small polyps in the sigmoid colon and at the splenic flexure, removed with a cold snare. Resected and retrieved.  - One small polyp in the distal sigmoid colon. Biopsied.  - External hemorrhoids.   RADIOGRAPHIC STUDIES: I have personally reviewed the radiological images as listed and agreed with the findings in the report.  CT CHEST LUNG CANCER SCREENING LOW  DOSE WO CONTRAST CLINICAL DATA:  Current 53 pack-year smoker.  EXAM: CT CHEST WITHOUT CONTRAST LOW-DOSE FOR LUNG CANCER SCREENING  TECHNIQUE: Multidetector CT imaging of the chest was performed following the standard protocol without IV contrast.  RADIATION DOSE REDUCTION: This exam was performed according to the departmental dose-optimization program which includes automated exposure control, adjustment of the mA and/or kV according to patient size and/or use of iterative reconstruction technique.  COMPARISON:  09/04/2022.  FINDINGS: Cardiovascular: Atherosclerotic calcification of the aorta, aortic valve and coronary arteries. Heart is enlarged. No pericardial effusion.  Mediastinum/Nodes: Low right paratracheal lymph node measures 12 mm unchanged. No pathologically enlarged mediastinal, hilar or axillary lymph nodes. Esophagus is grossly unremarkable.  Lungs/Pleura: Centrilobular and paraseptal emphysema. Smoking related respiratory bronchiolitis. No  suspicious pulmonary nodules. No pleural fluid. Airway is unremarkable.  Upper Abdomen: Liver margin is irregular. Gallstones. 2.8 cm left adrenal nodule measures 19 Hounsfield units, stable. No specific follow-up necessary. Similar small to borderline enlarged gastrohepatic ligament and periportal lymph nodes, likely reactive in etiology.  Musculoskeletal: Degenerative changes in the spine. Old rib fractures.  IMPRESSION: 1. Lung-RADS 1, negative. Continue annual screening with low-dose chest CT without contrast in 12 months. 2. Cirrhosis. 3. Cholelithiasis. 4. Left adrenal adenoma. 5. Aortic atherosclerosis (ICD10-I70.0). Coronary artery calcification. 6.  Emphysema (ICD10-J43.9).  Electronically Signed   By: Shearon Denis M.D.   On: 09/23/2023 15:52   ASSESSMENT & PLAN:  Patient is a 63 y.o. female referred for iron deficiency anemia  Squamous cell carcinoma of esophagus/Moderate to severe squamous  dysplasia.- March 2022 Patient had a history of esophageal squamous cell carcinoma in situ with no invasive disease S/p mucosal endoscopic dissection in 02/2021 Getting frequent endoscopies with GI  - Continue to follow with GI  IDA (iron deficiency anemia) The most likely cause of her anemia is due to chronic blood loss.  Lab Results  Component Value Date   IRON 22 (L) 11/13/2023   TIBC 532 (H) 11/13/2023   FERRITIN 3 (L) 11/13/2023    Last colonoscopy/endoscopy: - Endoscopy: 06/2023: Showing scar in the middle third of the esophagus - Colonoscopy: 05/2020: 3 small polyps were identified and external hemorrhoids.  No active bleeding.   -We discussed some of the risks, benefits, and alternatives of intravenous iron infusions. The patient is symptomatic from anemia and the iron level is critically low. She desires to achieve higher levels of iron faster for adequate hematopoesis. Some of the side-effects to be expected including risks of infusion reactions, phlebitis, headaches, nausea and fatigue.  The patient is willing to proceed. Patient education material was dispensed. Goal is to keep ferritin level greater than 50 and resolution of anemia -Start oral iron every other day.  Use MiraLAX for constipation  Return to clinic in 6 weeks with labs to assess response to IV iron   Alcoholic cirrhosis [facility]) Patient is currently sober since October 2023. Being followed by Dr. Mordechai April  - Continue to follow with Dr.Carver   Orders Placed This Encounter  Procedures   Ferritin    Standing Status:   Future    Expected Date:   12/30/2023    Expiration Date:   11/20/2024   Folate    Standing Status:   Future    Expected Date:   12/30/2023    Expiration Date:   11/20/2024   Vitamin B12    Standing Status:   Future    Expected Date:   12/30/2023    Expiration Date:   11/20/2024   CBC with Differential/Platelet    Standing Status:   Future    Expected Date:   12/30/2023    Expiration Date:    11/20/2024   Comprehensive metabolic panel with GFR    Standing Status:   Future    Expected Date:   12/30/2023    Expiration Date:   11/20/2024   Iron and TIBC    Standing Status:   Future    Expected Date:   12/30/2023    Expiration Date:   11/20/2024    The total time spent in the appointment was 60 minutes encounter with patients including review of chart and various tests results, discussions about plan of care and coordination of care plan   All questions were answered. The patient  knows to call the clinic with any problems, questions or concerns. No barriers to learning was detected.   Eduardo Grade, MD 5/1/20259:30 AM

## 2023-11-21 NOTE — Assessment & Plan Note (Addendum)
 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 Being followed by Dr. Mordechai April  - Continue to follow with Dr.Carver

## 2023-11-27 ENCOUNTER — Inpatient Hospital Stay

## 2023-11-27 VITALS — BP 146/62 | HR 58 | Temp 98.0°F | Resp 16

## 2023-11-27 DIAGNOSIS — D509 Iron deficiency anemia, unspecified: Secondary | ICD-10-CM | POA: Diagnosis not present

## 2023-11-27 DIAGNOSIS — D500 Iron deficiency anemia secondary to blood loss (chronic): Secondary | ICD-10-CM

## 2023-11-27 MED ORDER — ACETAMINOPHEN 325 MG PO TABS
650.0000 mg | ORAL_TABLET | Freq: Once | ORAL | Status: AC
Start: 2023-11-27 — End: 2023-11-27
  Administered 2023-11-27: 650 mg via ORAL
  Filled 2023-11-27: qty 2

## 2023-11-27 MED ORDER — IRON SUCROSE 500 MG IVPB - SIMPLE MED
500.0000 mg | Freq: Once | INTRAVENOUS | Status: AC
  Administered 2023-11-27: 500 mg via INTRAVENOUS
  Filled 2023-11-27: qty 500

## 2023-11-27 MED ORDER — SODIUM CHLORIDE 0.9 % IV SOLN
INTRAVENOUS | Status: DC
Start: 2023-11-27 — End: 2023-11-27

## 2023-11-27 MED ORDER — CETIRIZINE HCL 10 MG PO TABS
10.0000 mg | ORAL_TABLET | Freq: Once | ORAL | Status: AC
  Administered 2023-11-27: 10 mg via ORAL
  Filled 2023-11-27: qty 1

## 2023-11-27 NOTE — Progress Notes (Signed)
 Patient tolerated iron infusion with no complaints voiced.  Peripheral IV site clean and dry with good blood return noted before and after infusion.  Band aid applied.  VSS with discharge and left in satisfactory condition with no s/s of distress noted.

## 2023-11-27 NOTE — Patient Instructions (Signed)

## 2023-12-02 ENCOUNTER — Telehealth: Payer: Self-pay | Admitting: *Deleted

## 2023-12-02 MED ORDER — PEG 3350-KCL-NA BICARB-NACL 420 G PO SOLR: 4000.0000 mL | Freq: Once | ORAL | 0 refills | Status: AC

## 2023-12-02 NOTE — Telephone Encounter (Signed)
 Patient needs colonoscopy. Please let her know her hematologist reached out to me about it. If she is agreeable please schedule, ASA 3.    Spoke with pt and she is aware of recs and agreeable. Scheduled for 6/4. Aware will call back with pre-op appointment.rx for prep sent to pharmacy. Instructions also mailed.

## 2023-12-03 ENCOUNTER — Encounter: Payer: Self-pay | Admitting: *Deleted

## 2023-12-05 ENCOUNTER — Inpatient Hospital Stay

## 2023-12-05 VITALS — BP 166/65 | HR 57 | Temp 98.7°F | Resp 18

## 2023-12-05 DIAGNOSIS — D509 Iron deficiency anemia, unspecified: Secondary | ICD-10-CM | POA: Diagnosis not present

## 2023-12-05 DIAGNOSIS — D500 Iron deficiency anemia secondary to blood loss (chronic): Secondary | ICD-10-CM

## 2023-12-05 MED ORDER — ACETAMINOPHEN 325 MG PO TABS
650.0000 mg | ORAL_TABLET | Freq: Once | ORAL | Status: AC
  Administered 2023-12-05: 650 mg via ORAL
  Filled 2023-12-05: qty 2

## 2023-12-05 MED ORDER — CETIRIZINE HCL 10 MG PO TABS
10.0000 mg | ORAL_TABLET | Freq: Once | ORAL | Status: AC
  Administered 2023-12-05: 10 mg via ORAL
  Filled 2023-12-05: qty 1

## 2023-12-05 MED ORDER — IRON SUCROSE 500 MG IVPB - SIMPLE MED
500.0000 mg | Freq: Once | INTRAVENOUS | Status: AC
  Administered 2023-12-05: 500 mg via INTRAVENOUS
  Filled 2023-12-05: qty 500

## 2023-12-05 MED ORDER — SODIUM CHLORIDE 0.9 % IV SOLN: INTRAVENOUS | Status: DC

## 2023-12-05 NOTE — Patient Instructions (Signed)
 [facility] [place] - A DEPT OF [facility]  Discharge Instructions: Thank you for choosing [facility] to provide your oncology and hematology care.  If you have a lab appointment with the Cancer Center - please note that after April 8th, 2024, all labs will be drawn in the cancer center.  You do not have to check in or register with the main entrance as you have in the past but will complete your check-in in the cancer center.  Wear comfortable clothing and clothing appropriate for easy access to any Portacath or PICC line.   We strive to give you quality time with your provider. You may need to reschedule your appointment if you arrive late (15 or more minutes).  Arriving late affects you and other patients whose appointments are after yours.  Also, if you miss three or more appointments without notifying the office, you may be dismissed from the clinic at the provider's discretion.      For prescription refill requests, have your pharmacy contact our office and allow 72 hours for refills to be completed.    Today you received Venofer IV iron infusion.     BELOW ARE SYMPTOMS THAT SHOULD BE REPORTED IMMEDIATELY: *FEVER GREATER THAN 100.4 F (38 C) OR HIGHER *CHILLS OR SWEATING *NAUSEA AND VOMITING THAT IS NOT CONTROLLED WITH YOUR NAUSEA MEDICATION *UNUSUAL SHORTNESS OF BREATH *UNUSUAL BRUISING OR BLEEDING *URINARY PROBLEMS (pain or burning when urinating, or frequent urination) *BOWEL PROBLEMS (unusual diarrhea, constipation, pain near the anus) TENDERNESS IN MOUTH AND THROAT WITH OR WITHOUT PRESENCE OF ULCERS (sore throat, sores in mouth, or a toothache) UNUSUAL RASH, SWELLING OR PAIN  UNUSUAL VAGINAL DISCHARGE OR ITCHING   Items with * indicate a potential emergency and should be followed up as soon as possible or go to the Emergency Department if any problems should occur.  Please show the CHEMOTHERAPY ALERT CARD or IMMUNOTHERAPY ALERT CARD at  check-in to the Emergency Department and triage nurse.  Should you have questions after your visit or need to cancel or reschedule your appointment, please contact [facility]  - A DEPT OF [facility] [phone]  and follow the prompts.  Office hours are 8:00 a.m. to 4:30 p.m. Monday - Friday. Please note that voicemails left after 4:00 p.m. may not be returned until the following business day.  We are closed weekends and major holidays. You have access to a nurse at all times for urgent questions. Please call the main number to the clinic [phone] and follow the prompts.  For any non-urgent questions, you may also contact your provider using MyChart. We now offer e-Visits for anyone 13 and older to request care online for non-urgent symptoms. For details visit [email or web address].   Also download the MyChart app! Go to the app store, search "MyChart", open the app, select [place], and log in with your MyChart username and password.

## 2023-12-05 NOTE — Progress Notes (Signed)
Patient presents today for iron infusion. Patient is in satisfactory condition with no new complaints voiced.  Vital signs are stable.  We will proceed with infusion per provider orders.    Peripheral IV started with good blood return pre and post infusion.  Venofer 500 mg given today per MD orders. Tolerated infusion without adverse affects. Vital signs stable. No complaints at this time. Discharged from clinic ambulatory in stable condition. Alert and oriented x 3. F/U with [facility] as scheduled.   

## 2023-12-18 ENCOUNTER — Inpatient Hospital Stay: Admitting: Licensed Clinical Social Worker

## 2023-12-18 ENCOUNTER — Encounter: Payer: Self-pay | Admitting: *Deleted

## 2023-12-18 ENCOUNTER — Ambulatory Visit (INDEPENDENT_AMBULATORY_CARE_PROVIDER_SITE_OTHER): Admitting: Internal Medicine

## 2023-12-18 ENCOUNTER — Other Ambulatory Visit: Payer: Self-pay | Admitting: Family Medicine

## 2023-12-18 ENCOUNTER — Encounter: Payer: Self-pay | Admitting: Internal Medicine

## 2023-12-18 VITALS — BP 146/79 | HR 69 | Temp 97.5°F | Ht 61.0 in | Wt 152.2 lb

## 2023-12-18 DIAGNOSIS — K7030 Alcoholic cirrhosis of liver without ascites: Secondary | ICD-10-CM | POA: Diagnosis not present

## 2023-12-18 DIAGNOSIS — F419 Anxiety disorder, unspecified: Secondary | ICD-10-CM

## 2023-12-18 DIAGNOSIS — D500 Iron deficiency anemia secondary to blood loss (chronic): Secondary | ICD-10-CM

## 2023-12-18 DIAGNOSIS — C159 Malignant neoplasm of esophagus, unspecified: Secondary | ICD-10-CM

## 2023-12-18 DIAGNOSIS — Z8501 Personal history of malignant neoplasm of esophagus: Secondary | ICD-10-CM

## 2023-12-18 DIAGNOSIS — F1090 Alcohol use, unspecified, uncomplicated: Secondary | ICD-10-CM | POA: Diagnosis not present

## 2023-12-18 DIAGNOSIS — F17210 Nicotine dependence, cigarettes, uncomplicated: Secondary | ICD-10-CM

## 2023-12-18 DIAGNOSIS — K219 Gastro-esophageal reflux disease without esophagitis: Secondary | ICD-10-CM

## 2023-12-18 DIAGNOSIS — D509 Iron deficiency anemia, unspecified: Secondary | ICD-10-CM

## 2023-12-18 DIAGNOSIS — Z7689 Persons encountering health services in other specified circumstances: Secondary | ICD-10-CM

## 2023-12-18 DIAGNOSIS — K269 Duodenal ulcer, unspecified as acute or chronic, without hemorrhage or perforation: Secondary | ICD-10-CM

## 2023-12-18 DIAGNOSIS — F1091 Alcohol use, unspecified, in remission: Secondary | ICD-10-CM

## 2023-12-18 NOTE — Patient Instructions (Addendum)
 Would recommend that you have your blood work done on Monday 6/2.  Your preoperative appointment is at 9 AM on this day.  You can have your blood work done afterwards.  Proceed with colonoscopy next week.  Continue to follow-up with hematology.  It was great to see you again.  I will see you next week.  Dr. Mordechai April

## 2023-12-18 NOTE — Progress Notes (Signed)
 [facility] CSW Progress Note  Clinical Child psychotherapist contacted patient by phone at request of medical provider due to SDOH concerns.  Pt resides alone, stating that her ex-husband was the one who used to take care of things and it has been a struggle for her since their divorce and his passing shortly after the divorce.  Pt states she struggled w/ substance abuse, but has been clean since October of 2023.  Pt's son stays w/ her periodically, but he does not provide any assistance and often is asking for money from pt.  Pt states she tries to pay her bills, but is not able to keep everything organized to stay on top of everything.  Per pt she also struggles w/ depression and is prescribed anti-depressants by her primary care physician who she has only recently established with.  Previously pt reports she saw a psychiatrist but did not feel it was beneficial and now gets her psych meds filled by her PCP.  CSW discussed the possibility of connecting to community programs that may be able to offer assistance w/ organizing paying bills and general support which pt may qualify for through a psychiatric diagnosis.  Pt open to a referral to psychiatry to explore additional support.  Pt verbalized agreement w/ CSW sending a message to PCP regarding the above which was sent on behalf of pt.  CSW to remain available as appropriate.       Quenton Bruns, LCSW Clinical Social Worker [facility]

## 2023-12-18 NOTE — Progress Notes (Signed)
 Referring Provider: Del Orbe Polanco, Ilian* Primary Care Physician:  Rosanna Comment, FNP Primary GI:  Dr. Mordechai April  Chief Complaint  Patient presents with   Follow-up    Patient here today for a follow up on Anemia, cirrhosis, Gerd, on pantoprazole  40 mg bid. Patient says she is still having issues with dysphagia. Patient scheduled for [facility] on 12/25/2023.She is having issues with sob. Last hgb 11/08/2023 7.1 Fe %4. Patient says she has had infusions,but unknown if Fe infusions.     HPI:   Regina Richardson is a 63 y.o. female who presents to the clinic today for follow up visit. She has a history of GERD, squamous cell carcinoma of the esophagus resected at [facility], alcoholic cirrhosis.   Cirrhosis: initially seen in our clinic October 2021. Work-up in included hemochromatosis DNA negative for C282Y and H63D, ferritin returned to normal at 91, alk phos 195, AST 66, ALT 31, ASMA negative, ANA negative, ANA negative, IgG elevated at 1702, IgA elevated at 783.  Suspected IgA and IgG elevation may be secondary to alcohol.  AFP 3.5. Hepatitis C antibody negative, hepatitis B surface antigen negative.  Advise she continue to work towards alcohol cessation.  She was lost to follow-up.   Hospitalization at [facility] October 2023 after presenting with abdominal pain. CT A/P with contrast showing moderately severe duodenitis, hepatic cirrhosis, stable benign left adrenal adenoma. Gastrin level 294, not felt to be significantly elevated in the range of a gastrinoma, plan for repeat testing off PPI if possible as an outpatient. MELD Na of 9 while inpatient.   EGD April 27, 2022: - Scar in the middle third of the esophagus. - Mucosal nodule found in the esophagus. Biopsied. - Mucous like gastric fluid. Fluid aspiration performed. Normal stomach biopsied. - Multiple non-bleeding duodenal ulcers with a clean ulcer base (Forrest Class III). - Duodenitis -Gastric biopsy showed reactive  gastropathy. -Esophageal nodule biopsy showed focal benign gastric mucosa consistent with inlet patch, squamous mucosa.  Had repeat lab work done February 2024.  Alk phos 173, AST 41, ALT 32, T. bili 0.2, negative AMA, negative ANA, negative smooth muscle, IgG 1789, IgA 688. H pylori stool Ag negative. Repeat gastrin level WNL.  MELD 3.0 of 8  No history of hepatic encephalopathy.  No history of portal hypertension, ascites, esophageal varices.  Duodenal ulcers/chronic GERD-abdominal pain has resolved.  H. pylori stool antigen negative.  Gastrin level WNL.  GERD well-controlled.    EGD 07/15/2023 with scar in the middle third of the esophagus, normal stomach, normal duodenum.  Anemia: Recent blood work by PCP 10/29/2023 with hemoglobin of 7.1, MCV 71.  Baseline around 9-10.  Denies any gross melena or hematochezia.  Significant iron  deficiency.  Has received 2 iron  infusions.  Following with hematology.  Scheduled for updated blood work next week.  Scheduled for colonoscopy next week.  Pertinent procedure history:   Colonoscopy February 2019 with Dr. Sandrea Cruel with [facility] for abnormal cecum on CT. Findings included prominent and moderately lipomatous ileocecal valve, 9 mm sessile polyp in hepatic flexure, 8 mm sessile polyp in the descending colon, otherwise normal exam.  Pathology with sessile serrated polyp and hyperplastic polyp. Recommended repeat colonoscopy in 5 years.   EGD/colonoscopy in February 2019 for anemia and epigastric abdominal pain.  She was found to have LA grade B esophagitis without bleeding s/p biopsied, diffuse moderate inflammation and granularity in the entire examined stomach s/p biopsied, normal examined duodenum s/p biopsy. Gastric biopsy with chronic  inactive gastritis without H. pylori, esophageal biopsy with mildly inflamed squamous mucosa, duodenal biopsy benign. H.pylori breast test positive 07/2017. Patient denies treatment at that time.   Procedures 06/15/2020  performed by Dr. Homero Luster: Colonoscopy: 2 polyps resected and retrieved, 1 small polyp in the distal sigmoid biopsy, external hemorrhoids.  Pathology with 1 tubular adenoma and 1 hyperplastic polyp.  EGD: Proximal esophageal web, focal esophagitis at proximal esophagus s/p biopsy, benign-appearing esophageal stenosis s/p dilated, portal hypertensive gastropathy.  Esophageal biopsy with moderate squamous dysplasia.  Dr. Homero Luster recommended repeat EGD in about 8 weeks.   EGD March 2022: Eroded, inflamed mucosa in the esophagus, portal hypertension gastropathy, esophageal biopsy showed moderate to severe squamous dysplasia    EGD/EUS with Dr. Brice Campi May 2022:  Floyd Hutchinson (with contact bleeding), granular, smooth, texture changed mucosa in the esophagus at 23-26 cm was encountered. Under NBI imaging and then spraying with Lugol's stain I could visualize a lesion that was 3 cm in length and encompassed 30-40% of the esophageal wall. Biopsied quickly but not as in depth as I would have like due to increased coughing and also 2 prior EGDs with Squamous dysplasia already being noted. - Erythematous mucosa in the stomach. Biopsied. - Possible mild portal hypertensive gastropathy in proximal stomach (though again, normal spleen size and only hepatomegaly appreciated on most recent CT). - Endosonographic imaging in the esophagus showed no wall thickening throughout. - One benign lymph node was visualized in the middle paraesophageal mediastinum (level 51M). Tissue has not been obtained. However, the endosonographic appearance is suggestive of benign inflammatory changes. - Pancreatic parenchymal abnormalities consisting of lobularity were noted in the pancreatic    Biopsy showed mild chronic gastric inflammation, negative for H. pylori, distal esophageal biopsy with slight inflammation, biopsies from 23 to 36 cm showed moderate to severe squamous dysplasia.   EGD August 2022: At [facility].    Impression: - Two  large plaques in the prox - middle third of the  esophagus. Clips (MR conditional) were placed. - Normal stomach. - Normal examined duodenum. - Endoscopic submucosal dissection was performed.  Resection and retrieval were complete. -Two foci of squamous cell carcinoma in situ, focally extending to the peripheral inked margin.No invasive carcinoma is seen.Multiple levels are examined   EGD October 2022: At [facility].   Impression: - Scar in the proximal esophagus and in the mid  esophagus. Biopsied. Dilated. - Normal stomach. - Normal examined duodenum. -if path normal, repeat EGD in 1 year -path showed Esophageal squamous mucosa with reactive change. Negative for dysplasia or carcinoma.  Past Medical History:  Diagnosis Date   Alcoholism (HCC)    Anxiety    Apnea 06/11/2021   Arrhythmia    heart   Arthritis    Cancer (HCC)    cervical cancer in the 80s   Cirrhosis (HCC) 04/2020   alcoholic;    Depression    Dysrhythmia    GERD (gastroesophageal reflux disease)    Hypertension    Osteoporosis    PTSD (post-traumatic stress disorder)    Scoliosis    Urinary incontinence     Past Surgical History:  Procedure Laterality Date   BIOPSY  10/11/2020   Procedure: BIOPSY;  Surgeon: Vinetta Greening, DO;  Location: [facility];  Service: Endoscopy;;   BIOPSY  12/12/2020   Procedure: BIOPSY;  Surgeon: Normie Becton., MD;  Location: [facility];  Service: Gastroenterology;;   BIOPSY  04/27/2022   Procedure: BIOPSY;  Surgeon: Urban Garden, MD;  Location: [facility];  Service: Gastroenterology;;   CERVICAL BIOPSY  W/ LOOP ELECTRODE EXCISION     CESAREAN SECTION     COLONOSCOPY  08/2017   Dr. Sandrea Cruel;  prominent and moderately lipomatous ileocecal valve, 9 mm sessile polyp in hepatic flexure, 8 mm sessile polyp in the descending colon, otherwise normal exam.  Pathology with sessile serrated polyp and hyperplastic polyp.  Recommended repeat colonoscopy in 5 years.    COLONOSCOPY N/A 06/15/2020   Procedure: COLONOSCOPY;  Surgeon: Ruby Corporal, MD; 2 polyps resected and retrieved, 1 small polyp in the distal sigmoid biopsy, external hemorrhoids.  Pathology with 1 tubular adenoma and 1 hyperplastic polyp.    COLPOSCOPY     EGD with mucosal resection  03/16/2021   [facility]: 2 large plaques in the proximal-mid third of the esophagus s/p endoscopic submucosal resection and retrieval, placement of 4 MR conditional clips.  Normal stomach, normal examined duodenum.  Pathology revealed squamous cell carcinoma in situ focally extending to the peripheral inked margin.  No invasive carcinoma. Recommended repeat EGD in 3 weeks.   ESOPHAGEAL DILATION N/A 06/15/2020   Procedure: ESOPHAGEAL DILATION;  Surgeon: Ruby Corporal, MD;  Location: [facility];  Service: Endoscopy;  Laterality: N/A;   ESOPHAGOGASTRODUODENOSCOPY  08/2017   Dr. Sandrea Cruel; LA grade B esophagitis without bleeding s/p biopsied, diffuse moderate inflammation and granularity in the entire examined stomach s/p biopsied, normal examined duodenum s/p biopsy. Gastric biopsy with chronic inactive gastritis without H. pylori, esophageal biopsy with mildly inflamed squamous mucosa, duodenal biopsy benign.   ESOPHAGOGASTRODUODENOSCOPY N/A 06/15/2020   Procedure: ESOPHAGOGASTRODUODENOSCOPY (EGD);  Surgeon: Ruby Corporal, MD;  Proximal esophageal web, focal esophagitis at proximal esophagus s/p biopsy, benign-appearing esophageal stenosis s/p dilated, portal hypertensive gastropathy.  Esophageal biopsy with moderate squamous dysplasia.     ESOPHAGOGASTRODUODENOSCOPY (EGD) WITH PROPOFOL  N/A 10/11/2020   Surgeon: Vinetta Greening, DO; Eroded, inflamed mucosa in the esophagus. Biopsied. Portal hypertensive gastropathy.  Recommended PPI twice daily.  Pathology with moderate to severe squamous dysplasia.   ESOPHAGOGASTRODUODENOSCOPY (EGD) WITH PROPOFOL  N/A 12/12/2020   Surgeon: Normie Becton., MD; Floyd Hutchinson,  granular, mucosa in the esophagus at 23-26 cm.  Lesion 3 cm in length and encompassed 30-40% of esophageal wall s/p quick biopsy.  Erythematous mucosa in the stomach biopsied, portal hypertensive gastropathy.  Gastric biopsy with mild chronic inflammation, negative for H. pylori.  Esophageal biopsy with moderate to severe squamous dysplasia.   ESOPHAGOGASTRODUODENOSCOPY (EGD) WITH PROPOFOL  N/A 04/27/2022   Procedure: ESOPHAGOGASTRODUODENOSCOPY (EGD) WITH PROPOFOL ;  Surgeon: Urban Garden, MD;  Location: [facility];  Service: Gastroenterology;  Laterality: N/A;   FINGER SURGERY     HEMORRHOID SURGERY     left collar bone surgery     had fx, pin placed and then complitcations caused them to remove most of the bone   left forearm surgery     from left elbow down   TOTAL ABDOMINAL HYSTERECTOMY     still has ovaries   UPPER ESOPHAGEAL ENDOSCOPIC ULTRASOUND (EUS) N/A 12/12/2020   Surgeon: Normie Becton., MD;without esophageal wall thickening, 1 lymph node in the middle paraesophageal mediastinum that was suggestive of benign inflammatory changes, pancreatic lobularity in pancreatic body, queried possibility of chronic pancreatitis though complete EUS not performed and no history of pancreatitis.    Current Outpatient Medications  Medication Sig Dispense Refill   allopurinol  (ZYLOPRIM ) 300 MG tablet Take 1 tablet (300 mg total) by mouth daily. 90 tablet 3   budesonide -formoterol  (SYMBICORT )  160-4.5 MCG/ACT inhaler Inhale 2 puffs into the lungs 2 (two) times daily. 1 each 5   colchicine  0.6 MG tablet Take 1 tablet (0.6 mg total) by mouth 2 (two) times daily for 5 days. For gout flare up 10 tablet 0   diclofenac  (VOLTAREN ) 75 MG EC tablet Take 1 tablet (75 mg total) by mouth 2 (two) times daily. 60 tablet 2   DULoxetine  (CYMBALTA ) 60 MG capsule Take 2 capsules (120 mg total) by mouth daily. 180 capsule 1   folic acid  (FOLVITE ) 1 MG tablet Take 1 tablet (1 mg total) by mouth daily.  90 tablet 3   gabapentin  (NEURONTIN ) 300 MG capsule Take 1 capsule (300 mg total) by mouth at bedtime. 30 capsule 3   metoprolol  succinate (TOPROL -XL) 100 MG 24 hr tablet Take 1 tablet (100 mg total) by mouth daily. 90 tablet 3   pantoprazole  (PROTONIX ) 40 MG tablet Take 1 tablet (40 mg total) by mouth 2 (two) times daily. 60 tablet 11   QUEtiapine  (SEROQUEL ) 100 MG tablet TAKE 1 TABLET BY MOUTH EVERYDAY AT BEDTIME 90 tablet 1   rosuvastatin  (CRESTOR ) 20 MG tablet Take 1 tablet (20 mg total) by mouth daily. 90 tablet 3   umeclidinium-vilanterol (ANORO ELLIPTA ) 62.5-25 MCG/ACT AEPB Inhale 1 puff into the lungs in the morning.     ferrous sulfate  325 (65 FE) MG EC tablet Take 1 tablet (325 mg total) by mouth every other day. 45 tablet 3   No current facility-administered medications for this visit.    Allergies as of 12/18/2023   (No Known Allergies)    Family History  Problem Relation Age of Onset   Alcohol abuse Mother    Liver disease Mother        alcoholic cirrhosis   Heart disease Father    Alcohol abuse Father    Lung cancer Father    Diabetes Sister    Cirrhosis Brother        secondary to alcohol.    Liver disease Brother    Liver disease Sister        Alcoholic cirrhosis   Diabetes Maternal Aunt    Kidney disease Maternal Aunt    Diabetes Cousin        mat cousin   Breast cancer Neg Hx    Colon cancer Neg Hx    Esophageal cancer Neg Hx    Stomach cancer Neg Hx    Rectal cancer Neg Hx    Pancreatic cancer Neg Hx    Inflammatory bowel disease Neg Hx     Social History   Socioeconomic History   Marital status: Divorced    Spouse name: Not on file   Number of children: 1   Years of education: 12   Highest education level: Associate degree: occupational, Scientist, product/process development, or vocational program  Occupational History   Occupation: retired  Tobacco Use   Smoking status: Every Day    Current packs/day: 0.50    Average packs/day: 0.5 packs/day for 40.0 years (20.0 ttl  pk-yrs)    Types: Cigarettes    Passive exposure: Current   Smokeless tobacco: Never   Tobacco comments:    Smoking Cessation Offered.  Vaping Use   Vaping status: Never Used  Substance and Sexual Activity   Alcohol use: Not Currently    Alcohol/week: 12.0 standard drinks of alcohol    Types: 12 Cans of beer per week    Comment: 12 cans of beer daily   Drug use: No  Comment: Tried cocaine and marijuana in the 60s.    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    Moved back to [place] from [place]   Social Drivers of Health   Financial Resource Strain: High Risk (09/08/2023)   Overall Financial Resource Strain (CARDIA)    Difficulty of Paying Living Expenses: Very hard  Food Insecurity: Food Insecurity Present (11/21/2023)   Hunger Vital Sign    Worried About Running Out of Food in the Last Year: Often true    Ran Out of Food in the Last Year: Sometimes true  Transportation Needs: Unmet Transportation Needs (11/21/2023)   PRAPARE - Transportation    Lack of Transportation (Medical): No    Lack of Transportation (Non-Medical): Yes  Physical Activity: Unknown (09/08/2023)   Exercise Vital Sign    Days of Exercise per Week: 0 days    Minutes of Exercise per Session: Not on file  Stress: Stress Concern Present (09/08/2023)   [employer] of Occupational Health - Occupational Stress Questionnaire    Feeling of Stress : Very much  Social Connections: Socially Isolated (09/08/2023)   Social Connection and Isolation Panel [NHANES]    Frequency of Communication with Friends and Family: Once a week    Frequency of Social Gatherings with Friends and Family: Never    Attends Religious Services: Never    Database administrator or Organizations: No    Attends Engineer, structural: Not on file    Marital Status: Widowed    Subjective: Review of Systems   Constitutional:  Negative for chills and fever.  HENT:  Negative for congestion and hearing loss.   Eyes:  Negative for blurred vision and double vision.  Respiratory:  Negative for cough and shortness of breath.   Cardiovascular:  Negative for chest pain and palpitations.  Gastrointestinal:  Negative for abdominal pain, blood in stool, constipation, diarrhea, heartburn, melena and vomiting.  Genitourinary:  Negative for dysuria and urgency.  Musculoskeletal:  Negative for joint pain and myalgias.  Skin:  Negative for itching and rash.  Neurological:  Negative for dizziness and headaches.  Psychiatric/Behavioral:  Negative for depression. The patient is not nervous/anxious.      Objective: BP (!) 146/79 (BP Location: Right Arm, Patient Position: Sitting, Cuff Size: Large)   Pulse 69   Temp (!) 97.5 F (36.4 C) (Temporal)   Ht 5\' 1"  (1.549 m)   Wt 152 lb 3.2 oz (69 kg)   BMI 28.76 kg/m  Physical Exam Constitutional:      Appearance: Normal appearance.  HENT:     Head: Normocephalic and atraumatic.  Eyes:     Extraocular Movements: Extraocular movements intact.     Conjunctiva/sclera: Conjunctivae normal.  Cardiovascular:     Rate and Rhythm: Normal rate and regular rhythm.  Pulmonary:     Effort: Pulmonary effort is normal.     Breath sounds: Normal breath sounds.  Abdominal:     General: Bowel sounds are normal.     Palpations: Abdomen is soft.  Musculoskeletal:        General: No swelling. Normal range of motion.     Cervical back: Normal range of motion and neck supple.  Skin:    General: Skin is warm and dry.     Coloration: Skin is not jaundiced.  Neurological:     General: No  focal deficit present.     Mental Status: She is alert and oriented to person, place, and time.  Psychiatric:        Mood and Affect: Mood normal.        Behavior: Behavior normal.      Assessment/Plan:  1.  Alcohol cirrhosis-appears well compensated.  MELD 3.0 of 8.  No  complications today.  LFTs have normalized.  Continue alcohol cessation (sober since October 2023)  Due for US  for [facility] screening.  Will order today.  2.  Duodenal ulcers/chronic GERD-abdominal pain has resolved.  H. pylori stool antigen negative.  Gastrin level WNL.  No complaints today.  Continue to avoid all NSAIDs.  Continue pantoprazole  twice daily.    3.  Squamous cell carcinoma of esophagus-continue with regular surveillance EGDs.  4.  Iron  deficiency anemia-has received 2 iron  infusions.  Scheduled for updated colonoscopy next week.  I have recommended that she have her blood work done on Monday after her PAT.  5.  Chronic alcohol use-sober since October 23.  Congratulated her on this.  Continue all alcohol cessation.  Follow-up after colonoscopy  12/18/2023 1:56 PM   Disclaimer: This note was dictated with voice recognition software. Similar sounding words can inadvertently be transcribed and may not be corrected upon review.

## 2023-12-18 NOTE — Progress Notes (Unsigned)
    11/08/2023   10:26 AM 04/01/2023    1:34 PM 02/27/2023    2:34 PM 01/29/2023    2:10 PM  GAD 7 : Generalized Anxiety Score  Nervous, Anxious, on Edge 3 3 3 3   Control/stop worrying 3 3 3 3   Worry too much - different things 3 3 3 3   Trouble relaxing 3 3 3 3   Restless 3 3 3 3   Easily annoyed or irritable 3 3 3 3   Afraid - awful might happen 3 3 3 3   Total GAD 7 Score 21 21 21 21   Anxiety Difficulty Very difficult Somewhat difficult Extremely difficult Extremely difficult

## 2023-12-18 NOTE — H&P (View-Only) (Signed)
 Referring Provider: Del Orbe Polanco, Ilian* Primary Care Physician:  Rosanna Comment, FNP Primary GI:  Dr. Mordechai April  Chief Complaint  Patient presents with   Follow-up    Patient here today for a follow up on Anemia, cirrhosis, Gerd, on pantoprazole  40 mg bid. Patient says she is still having issues with dysphagia. Patient scheduled for [facility] on 12/25/2023.She is having issues with sob. Last hgb 11/08/2023 7.1 Fe %4. Patient says she has had infusions,but unknown if Fe infusions.     HPI:   Regina Richardson is a 63 y.o. female who presents to the clinic today for follow up visit. She has a history of GERD, squamous cell carcinoma of the esophagus resected at [facility], alcoholic cirrhosis.   Cirrhosis: initially seen in our clinic October 2021. Work-up in included hemochromatosis DNA negative for C282Y and H63D, ferritin returned to normal at 91, alk phos 195, AST 66, ALT 31, ASMA negative, ANA negative, ANA negative, IgG elevated at 1702, IgA elevated at 783.  Suspected IgA and IgG elevation may be secondary to alcohol.  AFP 3.5. Hepatitis C antibody negative, hepatitis B surface antigen negative.  Advise she continue to work towards alcohol cessation.  She was lost to follow-up.   Hospitalization at [facility] October 2023 after presenting with abdominal pain. CT A/P with contrast showing moderately severe duodenitis, hepatic cirrhosis, stable benign left adrenal adenoma. Gastrin level 294, not felt to be significantly elevated in the range of a gastrinoma, plan for repeat testing off PPI if possible as an outpatient. MELD Na of 9 while inpatient.   EGD April 27, 2022: - Scar in the middle third of the esophagus. - Mucosal nodule found in the esophagus. Biopsied. - Mucous like gastric fluid. Fluid aspiration performed. Normal stomach biopsied. - Multiple non-bleeding duodenal ulcers with a clean ulcer base (Forrest Class III). - Duodenitis -Gastric biopsy showed reactive  gastropathy. -Esophageal nodule biopsy showed focal benign gastric mucosa consistent with inlet patch, squamous mucosa.  Had repeat lab work done February 2024.  Alk phos 173, AST 41, ALT 32, T. bili 0.2, negative AMA, negative ANA, negative smooth muscle, IgG 1789, IgA 688. H pylori stool Ag negative. Repeat gastrin level WNL.  MELD 3.0 of 8  No history of hepatic encephalopathy.  No history of portal hypertension, ascites, esophageal varices.  Duodenal ulcers/chronic GERD-abdominal pain has resolved.  H. pylori stool antigen negative.  Gastrin level WNL.  GERD well-controlled.    EGD 07/15/2023 with scar in the middle third of the esophagus, normal stomach, normal duodenum.  Anemia: Recent blood work by PCP 10/29/2023 with hemoglobin of 7.1, MCV 71.  Baseline around 9-10.  Denies any gross melena or hematochezia.  Significant iron  deficiency.  Has received 2 iron  infusions.  Following with hematology.  Scheduled for updated blood work next week.  Scheduled for colonoscopy next week.  Pertinent procedure history:   Colonoscopy February 2019 with Dr. Sandrea Cruel with [facility] for abnormal cecum on CT. Findings included prominent and moderately lipomatous ileocecal valve, 9 mm sessile polyp in hepatic flexure, 8 mm sessile polyp in the descending colon, otherwise normal exam.  Pathology with sessile serrated polyp and hyperplastic polyp. Recommended repeat colonoscopy in 5 years.   EGD/colonoscopy in February 2019 for anemia and epigastric abdominal pain.  She was found to have LA grade B esophagitis without bleeding s/p biopsied, diffuse moderate inflammation and granularity in the entire examined stomach s/p biopsied, normal examined duodenum s/p biopsy. Gastric biopsy with chronic  inactive gastritis without H. pylori, esophageal biopsy with mildly inflamed squamous mucosa, duodenal biopsy benign. H.pylori breast test positive 07/2017. Patient denies treatment at that time.   Procedures 06/15/2020  performed by Dr. Homero Luster: Colonoscopy: 2 polyps resected and retrieved, 1 small polyp in the distal sigmoid biopsy, external hemorrhoids.  Pathology with 1 tubular adenoma and 1 hyperplastic polyp.  EGD: Proximal esophageal web, focal esophagitis at proximal esophagus s/p biopsy, benign-appearing esophageal stenosis s/p dilated, portal hypertensive gastropathy.  Esophageal biopsy with moderate squamous dysplasia.  Dr. Homero Luster recommended repeat EGD in about 8 weeks.   EGD March 2022: Eroded, inflamed mucosa in the esophagus, portal hypertension gastropathy, esophageal biopsy showed moderate to severe squamous dysplasia    EGD/EUS with Dr. Brice Campi May 2022:  Floyd Hutchinson (with contact bleeding), granular, smooth, texture changed mucosa in the esophagus at 23-26 cm was encountered. Under NBI imaging and then spraying with Lugol's stain I could visualize a lesion that was 3 cm in length and encompassed 30-40% of the esophageal wall. Biopsied quickly but not as in depth as I would have like due to increased coughing and also 2 prior EGDs with Squamous dysplasia already being noted. - Erythematous mucosa in the stomach. Biopsied. - Possible mild portal hypertensive gastropathy in proximal stomach (though again, normal spleen size and only hepatomegaly appreciated on most recent CT). - Endosonographic imaging in the esophagus showed no wall thickening throughout. - One benign lymph node was visualized in the middle paraesophageal mediastinum (level 33M). Tissue has not been obtained. However, the endosonographic appearance is suggestive of benign inflammatory changes. - Pancreatic parenchymal abnormalities consisting of lobularity were noted in the pancreatic    Biopsy showed mild chronic gastric inflammation, negative for H. pylori, distal esophageal biopsy with slight inflammation, biopsies from 23 to 36 cm showed moderate to severe squamous dysplasia.   EGD August 2022: At [facility]    Impression: - Two  large plaques in the prox - middle third of the  esophagus. Clips (MR conditional) were placed. - Normal stomach. - Normal examined duodenum. - Endoscopic submucosal dissection was performed.  Resection and retrieval were complete. -Two foci of squamous cell carcinoma in situ, focally extending to the peripheral inked margin.No invasive carcinoma is seen.Multiple levels are examined   EGD October 2022: At [facility].   Impression: - Scar in the proximal esophagus and in the mid  esophagus. Biopsied. Dilated. - Normal stomach. - Normal examined duodenum. -if path normal, repeat EGD in 1 year -path showed Esophageal squamous mucosa with reactive change. Negative for dysplasia or carcinoma.  Past Medical History:  Diagnosis Date   Alcoholism (HCC)    Anxiety    Apnea 06/11/2021   Arrhythmia    heart   Arthritis    Cancer (HCC)    cervical cancer in the 80s   Cirrhosis (HCC) 04/2020   alcoholic;    Depression    Dysrhythmia    GERD (gastroesophageal reflux disease)    Hypertension    Osteoporosis    PTSD (post-traumatic stress disorder)    Scoliosis    Urinary incontinence     Past Surgical History:  Procedure Laterality Date   BIOPSY  10/11/2020   Procedure: BIOPSY;  Surgeon: Vinetta Greening, DO;  Location: [facility];  Service: Endoscopy;;   BIOPSY  12/12/2020   Procedure: BIOPSY;  Surgeon: Normie Becton., MD;  Location: [facility];  Service: Gastroenterology;;   BIOPSY  04/27/2022   Procedure: BIOPSY;  Surgeon: Urban Garden, MD;  Location: [facility];  Service: Gastroenterology;;   CERVICAL BIOPSY  W/ LOOP ELECTRODE EXCISION     CESAREAN SECTION     COLONOSCOPY  08/2017   Dr. Sandrea Cruel;  prominent and moderately lipomatous ileocecal valve, 9 mm sessile polyp in hepatic flexure, 8 mm sessile polyp in the descending colon, otherwise normal exam.  Pathology with sessile serrated polyp and hyperplastic polyp.  Recommended repeat colonoscopy in 5 years.    COLONOSCOPY N/A 06/15/2020   Procedure: COLONOSCOPY;  Surgeon: Ruby Corporal, MD; 2 polyps resected and retrieved, 1 small polyp in the distal sigmoid biopsy, external hemorrhoids.  Pathology with 1 tubular adenoma and 1 hyperplastic polyp.    COLPOSCOPY     EGD with mucosal resection  03/16/2021   [facility]: 2 large plaques in the proximal-mid third of the esophagus s/p endoscopic submucosal resection and retrieval, placement of 4 MR conditional clips.  Normal stomach, normal examined duodenum.  Pathology revealed squamous cell carcinoma in situ focally extending to the peripheral inked margin.  No invasive carcinoma. Recommended repeat EGD in 3 weeks.   ESOPHAGEAL DILATION N/A 06/15/2020   Procedure: ESOPHAGEAL DILATION;  Surgeon: Ruby Corporal, MD;  Location: [facility];  Service: Endoscopy;  Laterality: N/A;   ESOPHAGOGASTRODUODENOSCOPY  08/2017   Dr. Sandrea Cruel; LA grade B esophagitis without bleeding s/p biopsied, diffuse moderate inflammation and granularity in the entire examined stomach s/p biopsied, normal examined duodenum s/p biopsy. Gastric biopsy with chronic inactive gastritis without H. pylori, esophageal biopsy with mildly inflamed squamous mucosa, duodenal biopsy benign.   ESOPHAGOGASTRODUODENOSCOPY N/A 06/15/2020   Procedure: ESOPHAGOGASTRODUODENOSCOPY (EGD);  Surgeon: Ruby Corporal, MD;  Proximal esophageal web, focal esophagitis at proximal esophagus s/p biopsy, benign-appearing esophageal stenosis s/p dilated, portal hypertensive gastropathy.  Esophageal biopsy with moderate squamous dysplasia.     ESOPHAGOGASTRODUODENOSCOPY (EGD) WITH PROPOFOL  N/A 10/11/2020   Surgeon: Vinetta Greening, DO; Eroded, inflamed mucosa in the esophagus. Biopsied. Portal hypertensive gastropathy.  Recommended PPI twice daily.  Pathology with moderate to severe squamous dysplasia.   ESOPHAGOGASTRODUODENOSCOPY (EGD) WITH PROPOFOL  N/A 12/12/2020   Surgeon: Normie Becton., MD; Floyd Hutchinson,  granular, mucosa in the esophagus at 23-26 cm.  Lesion 3 cm in length and encompassed 30-40% of esophageal wall s/p quick biopsy.  Erythematous mucosa in the stomach biopsied, portal hypertensive gastropathy.  Gastric biopsy with mild chronic inflammation, negative for H. pylori.  Esophageal biopsy with moderate to severe squamous dysplasia.   ESOPHAGOGASTRODUODENOSCOPY (EGD) WITH PROPOFOL  N/A 04/27/2022   Procedure: ESOPHAGOGASTRODUODENOSCOPY (EGD) WITH PROPOFOL ;  Surgeon: Urban Garden, MD;  Location: [facility];  Service: Gastroenterology;  Laterality: N/A;   FINGER SURGERY     HEMORRHOID SURGERY     left collar bone surgery     had fx, pin placed and then complitcations caused them to remove most of the bone   left forearm surgery     from left elbow down   TOTAL ABDOMINAL HYSTERECTOMY     still has ovaries   UPPER ESOPHAGEAL ENDOSCOPIC ULTRASOUND (EUS) N/A 12/12/2020   Surgeon: Normie Becton., MD;without esophageal wall thickening, 1 lymph node in the middle paraesophageal mediastinum that was suggestive of benign inflammatory changes, pancreatic lobularity in pancreatic body, queried possibility of chronic pancreatitis though complete EUS not performed and no history of pancreatitis.    Current Outpatient Medications  Medication Sig Dispense Refill   allopurinol  (ZYLOPRIM ) 300 MG tablet Take 1 tablet (300 mg total) by mouth daily. 90 tablet 3   budesonide -formoterol  (SYMBICORT )  160-4.5 MCG/ACT inhaler Inhale 2 puffs into the lungs 2 (two) times daily. 1 each 5   colchicine  0.6 MG tablet Take 1 tablet (0.6 mg total) by mouth 2 (two) times daily for 5 days. For gout flare up 10 tablet 0   diclofenac  (VOLTAREN ) 75 MG EC tablet Take 1 tablet (75 mg total) by mouth 2 (two) times daily. 60 tablet 2   DULoxetine  (CYMBALTA ) 60 MG capsule Take 2 capsules (120 mg total) by mouth daily. 180 capsule 1   folic acid  (FOLVITE ) 1 MG tablet Take 1 tablet (1 mg total) by mouth daily.  90 tablet 3   gabapentin  (NEURONTIN ) 300 MG capsule Take 1 capsule (300 mg total) by mouth at bedtime. 30 capsule 3   metoprolol  succinate (TOPROL -XL) 100 MG 24 hr tablet Take 1 tablet (100 mg total) by mouth daily. 90 tablet 3   pantoprazole  (PROTONIX ) 40 MG tablet Take 1 tablet (40 mg total) by mouth 2 (two) times daily. 60 tablet 11   QUEtiapine  (SEROQUEL ) 100 MG tablet TAKE 1 TABLET BY MOUTH EVERYDAY AT BEDTIME 90 tablet 1   rosuvastatin  (CRESTOR ) 20 MG tablet Take 1 tablet (20 mg total) by mouth daily. 90 tablet 3   umeclidinium-vilanterol (ANORO ELLIPTA ) 62.5-25 MCG/ACT AEPB Inhale 1 puff into the lungs in the morning.     ferrous sulfate  325 (65 FE) MG EC tablet Take 1 tablet (325 mg total) by mouth every other day. 45 tablet 3   No current facility-administered medications for this visit.    Allergies as of 12/18/2023   (No Known Allergies)    Family History  Problem Relation Age of Onset   Alcohol abuse Mother    Liver disease Mother        alcoholic cirrhosis   Heart disease Father    Alcohol abuse Father    Lung cancer Father    Diabetes Sister    Cirrhosis Brother        secondary to alcohol.    Liver disease Brother    Liver disease Sister        Alcoholic cirrhosis   Diabetes Maternal Aunt    Kidney disease Maternal Aunt    Diabetes Cousin        mat cousin   Breast cancer Neg Hx    Colon cancer Neg Hx    Esophageal cancer Neg Hx    Stomach cancer Neg Hx    Rectal cancer Neg Hx    Pancreatic cancer Neg Hx    Inflammatory bowel disease Neg Hx     Social History   Socioeconomic History   Marital status: Divorced    Spouse name: Not on file   Number of children: 1   Years of education: 12   Highest education level: Associate degree: occupational, Scientist, product/process development, or vocational program  Occupational History   Occupation: retired  Tobacco Use   Smoking status: Every Day    Current packs/day: 0.50    Average packs/day: 0.5 packs/day for 40.0 years (20.0 ttl  pk-yrs)    Types: Cigarettes    Passive exposure: Current   Smokeless tobacco: Never   Tobacco comments:    Smoking Cessation Offered.  Vaping Use   Vaping status: Never Used  Substance and Sexual Activity   Alcohol use: Not Currently    Alcohol/week: 12.0 standard drinks of alcohol    Types: 12 Cans of beer per week    Comment: 12 cans of beer daily   Drug use: No  Comment: Tried cocaine and marijuana in the 60s.    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    Moved back to [place] from [place]   Social Drivers of Health   Financial Resource Strain: High Risk (09/08/2023)   Overall Financial Resource Strain (CARDIA)    Difficulty of Paying Living Expenses: Very hard  Food Insecurity: Food Insecurity Present (11/21/2023)   Hunger Vital Sign    Worried About Running Out of Food in the Last Year: Often true    Ran Out of Food in the Last Year: Sometimes true  Transportation Needs: Unmet Transportation Needs (11/21/2023)   PRAPARE - Transportation    Lack of Transportation (Medical): No    Lack of Transportation (Non-Medical): Yes  Physical Activity: Unknown (09/08/2023)   Exercise Vital Sign    Days of Exercise per Week: 0 days    Minutes of Exercise per Session: Not on file  Stress: Stress Concern Present (09/08/2023)   [employer] of Occupational Health - Occupational Stress Questionnaire    Feeling of Stress : Very much  Social Connections: Socially Isolated (09/08/2023)   Social Connection and Isolation Panel [NHANES]    Frequency of Communication with Friends and Family: Once a week    Frequency of Social Gatherings with Friends and Family: Never    Attends Religious Services: Never    Database administrator or Organizations: No    Attends Engineer, structural: Not on file    Marital Status: Widowed    Subjective: Review of Systems   Constitutional:  Negative for chills and fever.  HENT:  Negative for congestion and hearing loss.   Eyes:  Negative for blurred vision and double vision.  Respiratory:  Negative for cough and shortness of breath.   Cardiovascular:  Negative for chest pain and palpitations.  Gastrointestinal:  Negative for abdominal pain, blood in stool, constipation, diarrhea, heartburn, melena and vomiting.  Genitourinary:  Negative for dysuria and urgency.  Musculoskeletal:  Negative for joint pain and myalgias.  Skin:  Negative for itching and rash.  Neurological:  Negative for dizziness and headaches.  Psychiatric/Behavioral:  Negative for depression. The patient is not nervous/anxious.      Objective: BP (!) 146/79 (BP Location: Right Arm, Patient Position: Sitting, Cuff Size: Large)   Pulse 69   Temp (!) 97.5 F (36.4 C) (Temporal)   Ht 5\' 1"  (1.549 m)   Wt 152 lb 3.2 oz (69 kg)   BMI 28.76 kg/m  Physical Exam Constitutional:      Appearance: Normal appearance.  HENT:     Head: Normocephalic and atraumatic.  Eyes:     Extraocular Movements: Extraocular movements intact.     Conjunctiva/sclera: Conjunctivae normal.  Cardiovascular:     Rate and Rhythm: Normal rate and regular rhythm.  Pulmonary:     Effort: Pulmonary effort is normal.     Breath sounds: Normal breath sounds.  Abdominal:     General: Bowel sounds are normal.     Palpations: Abdomen is soft.  Musculoskeletal:        General: No swelling. Normal range of motion.     Cervical back: Normal range of motion and neck supple.  Skin:    General: Skin is warm and dry.     Coloration: Skin is not jaundiced.  Neurological:     General: No  focal deficit present.     Mental Status: She is alert and oriented to person, place, and time.  Psychiatric:        Mood and Affect: Mood normal.        Behavior: Behavior normal.      Assessment/Plan:  1.  Alcohol cirrhosis-appears well compensated.  MELD 3.0 of 8.  No  complications today.  LFTs have normalized.  Continue alcohol cessation (sober since October 2023)  Due for US  for [facility] screening.  Will order today.  2.  Duodenal ulcers/chronic GERD-abdominal pain has resolved.  H. pylori stool antigen negative.  Gastrin level WNL.  No complaints today.  Continue to avoid all NSAIDs.  Continue pantoprazole  twice daily.    3.  Squamous cell carcinoma of esophagus-continue with regular surveillance EGDs.  4.  Iron  deficiency anemia-has received 2 iron  infusions.  Scheduled for updated colonoscopy next week.  I have recommended that she have her blood work done on Monday after her PAT.  5.  Chronic alcohol use-sober since October 23.  Congratulated her on this.  Continue all alcohol cessation.  Follow-up after colonoscopy  12/18/2023 1:56 PM   Disclaimer: This note was dictated with voice recognition software. Similar sounding words can inadvertently be transcribed and may not be corrected upon review.

## 2023-12-19 ENCOUNTER — Ambulatory Visit: Attending: Family Medicine | Admitting: Audiologist

## 2023-12-19 NOTE — Telephone Encounter (Signed)
 Pt informed of US  appt and mychart message also sent

## 2023-12-20 NOTE — Patient Instructions (Signed)
   Your procedure is scheduled on: 12/25/2023  Report to [facility] Main Entrance at   6:15  AM.  Call this number if you have problems the morning of surgery: [phone]   Remember:              Follow Directions on the letter you received from Your Physician's office regarding the Bowel Prep              No Smoking the day of Procedure :   Take these medicines the morning of surgery with A SIP OF WATER : Cymbalta , Metoprolol , gabapentin , pantoprazole   Use inhalers if needed   Do not wear jewelry, make-up or nail polish.    Do not bring valuables to the hospital.  Contacts, dentures or bridgework may not be worn into surgery.  .   Patients discharged the day of surgery will not be allowed to drive home.     Colonoscopy, Adult, Care After This sheet gives you information about how to care for yourself after your procedure. Your health care provider may also give you more specific instructions. If you have problems or questions, contact your health care provider. What can I expect after the procedure? After the procedure, it is common to have: A small amount of blood in your stool for 24 hours after the procedure. Some gas. Mild abdominal cramping or bloating.  Follow these instructions at home: General instructions  For the first 24 hours after the procedure: Do not drive or use machinery. Do not sign important documents. Do not drink alcohol. Do your regular daily activities at a slower pace than normal. Eat soft, easy-to-digest foods. Rest often. Take over-the-counter or prescription medicines only as told by your health care provider. It is up to you to get the results of your procedure. Ask your health care provider, or the department performing the procedure, when your results will be ready. Relieving cramping and bloating Try walking around when you have cramps or feel bloated. Apply heat to your abdomen as told by your health care provider. Use a heat source that  your health care provider recommends, such as a moist heat pack or a heating pad. Place a towel between your skin and the heat source. Leave the heat on for 20-30 minutes. Remove the heat if your skin turns bright red. This is especially important if you are unable to feel pain, heat, or cold. You may have a greater risk of getting burned. Eating and drinking Drink enough fluid to keep your urine clear or pale yellow. Resume your normal diet as instructed by your health care provider. Avoid heavy or fried foods that are hard to digest. Avoid drinking alcohol for as long as instructed by your health care provider. Contact a health care provider if: You have blood in your stool 2-3 days after the procedure. Get help right away if: You have more than a small spotting of blood in your stool. You pass large blood clots in your stool. Your abdomen is swollen. You have nausea or vomiting. You have a fever. You have increasing abdominal pain that is not relieved with medicine. This information is not intended to replace advice given to you by your health care provider. Make sure you discuss any questions you have with your health care provider. Document Released: 02/21/2004 Document Revised: 04/02/2016 Document Reviewed: 09/20/2015 Elsevier Interactive Patient Education  [employer].

## 2023-12-23 ENCOUNTER — Encounter (HOSPITAL_COMMUNITY): Payer: Self-pay

## 2023-12-23 ENCOUNTER — Encounter (HOSPITAL_COMMUNITY)
Admission: RE | Admit: 2023-12-23 | Discharge: 2023-12-23 | Disposition: A | Discharge status: Home / Self Care | Source: Ambulatory Visit | Attending: Internal Medicine | Admitting: Internal Medicine

## 2023-12-23 VITALS — BP 129/45 | HR 69 | Temp 98.0°F | Resp 18 | Ht 61.0 in | Wt 152.2 lb

## 2023-12-23 DIAGNOSIS — K625 Hemorrhage of anus and rectum: Secondary | ICD-10-CM | POA: Insufficient documentation

## 2023-12-23 DIAGNOSIS — Z01812 Encounter for preprocedural laboratory examination: Secondary | ICD-10-CM | POA: Diagnosis present

## 2023-12-23 HISTORY — DX: Emphysema, unspecified: J43.9

## 2023-12-23 LAB — CBC WITH DIFFERENTIAL/PLATELET
Abs Immature Granulocytes: 0.02 10*3/uL (ref 0.00–0.07)
Basophils Absolute: 0.1 10*3/uL (ref 0.0–0.1)
Basophils Relative: 2 %
Eosinophils Absolute: 0.1 10*3/uL (ref 0.0–0.5)
Eosinophils Relative: 1 %
HCT: 32.2 % — ABNORMAL LOW (ref 36.0–46.0)
Hemoglobin: 9.8 g/dL — ABNORMAL LOW (ref 12.0–15.0)
Immature Granulocytes: 0 %
Lymphocytes Relative: 24 %
Lymphs Abs: 1.6 10*3/uL (ref 0.7–4.0)
MCH: 26.6 pg (ref 26.0–34.0)
MCHC: 30.4 g/dL (ref 30.0–36.0)
MCV: 87.3 fL (ref 80.0–100.0)
Monocytes Absolute: 0.5 10*3/uL (ref 0.1–1.0)
Monocytes Relative: 8 %
Neutro Abs: 4.4 10*3/uL (ref 1.7–7.7)
Neutrophils Relative %: 65 %
Platelets: 220 10*3/uL (ref 150–400)
RBC: 3.69 MIL/uL — ABNORMAL LOW (ref 3.87–5.11)
Smear Review: NORMAL
WBC: 6.7 10*3/uL (ref 4.0–10.5)
nRBC: 0 % (ref 0.0–0.2)

## 2023-12-25 ENCOUNTER — Other Ambulatory Visit: Payer: Self-pay

## 2023-12-25 ENCOUNTER — Ambulatory Visit (HOSPITAL_COMMUNITY): Admitting: Anesthesiology

## 2023-12-25 ENCOUNTER — Encounter (HOSPITAL_COMMUNITY)
Admission: RE | Disposition: A | Discharge status: Home / Self Care | Payer: Self-pay | Source: Home / Self Care | Attending: Internal Medicine

## 2023-12-25 ENCOUNTER — Encounter (HOSPITAL_COMMUNITY): Payer: Self-pay | Admitting: Internal Medicine

## 2023-12-25 ENCOUNTER — Ambulatory Visit (HOSPITAL_COMMUNITY)
Admission: RE | Admit: 2023-12-25 | Discharge: 2023-12-25 | Disposition: A | Discharge status: Home / Self Care | Attending: Internal Medicine | Admitting: Internal Medicine

## 2023-12-25 DIAGNOSIS — J449 Chronic obstructive pulmonary disease, unspecified: Secondary | ICD-10-CM

## 2023-12-25 DIAGNOSIS — F32A Depression, unspecified: Secondary | ICD-10-CM | POA: Insufficient documentation

## 2023-12-25 DIAGNOSIS — Z8711 Personal history of peptic ulcer disease: Secondary | ICD-10-CM | POA: Insufficient documentation

## 2023-12-25 DIAGNOSIS — R1310 Dysphagia, unspecified: Secondary | ICD-10-CM | POA: Diagnosis not present

## 2023-12-25 DIAGNOSIS — Z8501 Personal history of malignant neoplasm of esophagus: Secondary | ICD-10-CM | POA: Diagnosis not present

## 2023-12-25 DIAGNOSIS — I10 Essential (primary) hypertension: Secondary | ICD-10-CM | POA: Insufficient documentation

## 2023-12-25 DIAGNOSIS — K648 Other hemorrhoids: Secondary | ICD-10-CM | POA: Diagnosis not present

## 2023-12-25 DIAGNOSIS — D509 Iron deficiency anemia, unspecified: Secondary | ICD-10-CM | POA: Insufficient documentation

## 2023-12-25 DIAGNOSIS — F17210 Nicotine dependence, cigarettes, uncomplicated: Secondary | ICD-10-CM | POA: Diagnosis not present

## 2023-12-25 DIAGNOSIS — K649 Unspecified hemorrhoids: Secondary | ICD-10-CM | POA: Diagnosis not present

## 2023-12-25 DIAGNOSIS — K2100 Gastro-esophageal reflux disease with esophagitis, without bleeding: Secondary | ICD-10-CM | POA: Insufficient documentation

## 2023-12-25 DIAGNOSIS — R0602 Shortness of breath: Secondary | ICD-10-CM | POA: Diagnosis not present

## 2023-12-25 DIAGNOSIS — Z79899 Other long term (current) drug therapy: Secondary | ICD-10-CM | POA: Diagnosis not present

## 2023-12-25 DIAGNOSIS — F419 Anxiety disorder, unspecified: Secondary | ICD-10-CM | POA: Insufficient documentation

## 2023-12-25 DIAGNOSIS — K5521 Angiodysplasia of colon with hemorrhage: Secondary | ICD-10-CM | POA: Insufficient documentation

## 2023-12-25 DIAGNOSIS — K7030 Alcoholic cirrhosis of liver without ascites: Secondary | ICD-10-CM | POA: Diagnosis not present

## 2023-12-25 HISTORY — PX: COLONOSCOPY: SHX5424

## 2023-12-25 SURGERY — COLONOSCOPY
Anesthesia: General

## 2023-12-25 MED ORDER — PROPOFOL 10 MG/ML IV BOLUS
INTRAVENOUS | Status: DC | PRN
  Administered 2023-12-25: 30 mg via INTRAVENOUS
  Administered 2023-12-25: 60 mg via INTRAVENOUS
  Administered 2023-12-25: 30 mg via INTRAVENOUS
  Administered 2023-12-25: 125 ug/kg/min via INTRAVENOUS

## 2023-12-25 MED ORDER — STERILE WATER FOR IRRIGATION IR SOLN
Status: DC | PRN
  Administered 2023-12-25: 60 mL

## 2023-12-25 MED ORDER — LACTATED RINGERS IV SOLN: INTRAVENOUS | Status: DC

## 2023-12-25 MED ORDER — PROPOFOL 500 MG/50ML IV EMUL: INTRAVENOUS | Status: DC | PRN

## 2023-12-25 MED ORDER — LACTATED RINGERS IV SOLN: INTRAVENOUS | Status: DC | PRN

## 2023-12-25 MED ORDER — LIDOCAINE 2% (20 MG/ML) 5 ML SYRINGE
INTRAMUSCULAR | Status: DC | PRN
  Administered 2023-12-25: 60 mg via INTRAVENOUS

## 2023-12-25 NOTE — Op Note (Signed)
 [facility] Patient Name: Regina Richardson Procedure Date: 12/25/2023 8:18 AM MRN: [id number] Date of Birth: 10-14-1960 Attending MD: Rolando Cliche. Mordechai April , [place], [id number] CSN: [id number] Age: 63 Admit Type: Outpatient Procedure:                Colonoscopy Indications:              Iron  deficiency anemia Providers:                Rolando Cliche. Mordechai April, DO, Alisa App, Emilee Tubb                            RN, RN, Annell Barrow Referring MD:              Medicines:                See the Anesthesia note for documentation of the                            administered medications Complications:            No immediate complications. Estimated Blood Loss:     Estimated blood loss was minimal. Procedure:                Pre-Anesthesia Assessment:                           - The anesthesia plan was to use monitored                            anesthesia care (MAC).                           After obtaining informed consent, the colonoscope                            was passed under direct vision. Throughout the                            procedure, the patient's blood pressure, pulse, and                            oxygen saturations were monitored continuously. The                            [id number]-HQ190L ([id number]) scope was introduced through                            the anus and advanced to the the cecum, identified                            by appendiceal orifice and ileocecal valve. The                            colonoscopy was performed without difficulty. The                            patient tolerated the procedure well.  The quality                            of the bowel preparation was evaluated using the                            BBPS [facility] Bowel Preparation Scale) with scores                            of: Right Colon = 3, Transverse Colon = 3 and Left                            Colon = 3 (entire mucosa seen well with no residual                            staining, small  fragments of stool or opaque                            liquid). The total BBPS score equals 9. Scope In: 8:37:24 AM Scope Out: 8:58:04 AM Scope Withdrawal Time: 0 hours 16 minutes 46 seconds  Total Procedure Duration: 0 hours 20 minutes 40 seconds  Findings:      Non-bleeding internal hemorrhoids were found.      Two small angiodysplastic lesions with bleeding were found in the       transverse colon. Coagulation for hemostasis using argon plasma was       successful.      Seven small angiodysplastic lesions without bleeding were found in the       transverse colon and in the ascending colon. Coagulation for bleeding       prevention using argon plasma was successful. Impression:               - Non-bleeding internal hemorrhoids.                           - Two bleeding colonic angiodysplastic lesions.                            Treated with argon plasma coagulation (APC).                           - Seven non-bleeding colonic angiodysplastic                            lesions. Treated with argon plasma coagulation                            (APC).                           - No specimens collected. Moderate Sedation:      Per Anesthesia Care Recommendation:           - Patient has a contact number available for                            emergencies. The signs and  symptoms of potential                            delayed complications were discussed with the                            patient. Return to normal activities tomorrow.                            Written discharge instructions were provided to the                            patient.                           - Resume previous diet.                           - Continue present medications.                           - Repeat colonoscopy in 5 years for surveillance.                           - Return to GI clinic in 3 months.                           - Continue on iron , continue to follow up with                             hematology Procedure Code(s):        --- Professional ---                           [phone], Colonoscopy, flexible; with control of                            bleeding, any method Diagnosis Code(s):        --- Professional ---                           K55.21, Angiodysplasia of colon with hemorrhage                           K64.8, Other hemorrhoids                           D50.9, Iron  deficiency anemia, unspecified CPT copyright 2022 American Medical Association. All rights reserved. The codes documented in this report are preliminary and upon coder review may  be revised to meet current compliance requirements. Rolando Cliche. Mordechai April, DO Rolando Cliche. Dawna Jakes, DO 12/25/2023 9:04:45 AM This report has been signed electronically. Number of Addenda: 0

## 2023-12-25 NOTE — Discharge Instructions (Signed)
  Colonoscopy Discharge Instructions  Read the instructions outlined below and refer to this sheet in the next few weeks. These discharge instructions provide you with general information on caring for yourself after you leave the hospital. Your doctor may also give you specific instructions. While your treatment has been planned according to the most current medical practices available, unavoidable complications occasionally occur.   ACTIVITY You may resume your regular activity, but move at a slower pace for the next 24 hours.  Take frequent rest periods for the next 24 hours.  Walking will help get rid of the air and reduce the bloated feeling in your belly (abdomen).  No driving for 24 hours (because of the medicine (anesthesia) used during the test).   Do not sign any important legal documents or operate any machinery for 24 hours (because of the anesthesia used during the test).  NUTRITION Drink plenty of fluids.  You may resume your normal diet as instructed by your doctor.  Begin with a light meal and progress to your normal diet. Heavy or fried foods are harder to digest and may make you feel sick to your stomach (nauseated).  Avoid alcoholic beverages for 24 hours or as instructed.  MEDICATIONS You may resume your normal medications unless your doctor tells you otherwise.  WHAT YOU CAN EXPECT TODAY Some feelings of bloating in the abdomen.  Passage of more gas than usual.  Spotting of blood in your stool or on the toilet paper.  IF YOU HAD POLYPS REMOVED DURING THE COLONOSCOPY: No aspirin products for 7 days or as instructed.  No alcohol for 7 days or as instructed.  Eat a soft diet for the next 24 hours.  FINDING OUT THE RESULTS OF YOUR TEST Not all test results are available during your visit. If your test results are not back during the visit, make an appointment with your caregiver to find out the results. Do not assume everything is normal if you have not heard from your  caregiver or the medical facility. It is important for you to follow up on all of your test results.  SEEK IMMEDIATE MEDICAL ATTENTION IF: You have more than a spotting of blood in your stool.  Your belly is swollen (abdominal distention).  You are nauseated or vomiting.  You have a temperature over 101.  You have abdominal pain or discomfort that is severe or gets worse throughout the day.   Your colonoscopy revealed 9 angiodysplasias.  These are small protruding blood vessels.  2 were actively oozing blood.  I treated all with coagulation today.  No polyps, no evidence of colon cancer.  Continue on iron  therapy.  Continue follow-up with hematology.  Your most recent hemoglobin is improved.  Follow-up with me in 2 to 3 months.  I hope you have a great rest of your week!  Rolando Cliche. Mordechai April, D.O. Gastroenterology and Hepatology [facility]

## 2023-12-25 NOTE — Anesthesia Preprocedure Evaluation (Signed)
 Anesthesia Evaluation  Patient identified by MRN, date of birth, ID band Patient awake    Reviewed: Allergy & Precautions, H&P , NPO status , Patient's Chart, lab work & pertinent test results, reviewed documented beta blocker date and time   Airway Mallampati: II  TM Distance: >3 FB Neck ROM: full    Dental no notable dental hx.    Pulmonary COPD, Current Smoker and Patient abstained from smoking.   Pulmonary exam normal breath sounds clear to auscultation       Cardiovascular Exercise Tolerance: Good hypertension, + dysrhythmias  Rhythm:regular Rate:Normal     Neuro/Psych  PSYCHIATRIC DISORDERS Anxiety Depression    negative neurological ROS     GI/Hepatic PUD,GERD  ,,(+) Cirrhosis         Endo/Other  negative endocrine ROS    Renal/GU negative Renal ROS  negative genitourinary   Musculoskeletal   Abdominal   Peds  Hematology  (+) Blood dyscrasia, anemia   Anesthesia Other Findings   Reproductive/Obstetrics negative OB ROS                             Anesthesia Physical Anesthesia Plan  ASA: 3  Anesthesia Plan: General   Post-op Pain Management:    Induction:   PONV Risk Score and Plan: Propofol  infusion  Airway Management Planned:   Additional Equipment:   Intra-op Plan:   Post-operative Plan:   Informed Consent: I have reviewed the patients History and Physical, chart, labs and discussed the procedure including the risks, benefits and alternatives for the proposed anesthesia with the patient or authorized representative who has indicated his/her understanding and acceptance.     Dental Advisory Given  Plan Discussed with: CRNA  Anesthesia Plan Comments:        Anesthesia Quick Evaluation

## 2023-12-25 NOTE — Transfer of Care (Signed)
 Immediate Anesthesia Transfer of Care Note  Patient: Regina Richardson  Procedure(s) Performed: COLONOSCOPY  Patient Location: Short Stay  Anesthesia Type:General  Level of Consciousness: awake  Airway & Oxygen Therapy: Patient Spontanous Breathing  Post-op Assessment: Report given to RN  Post vital signs: Reviewed and stable  Last Vitals:  Vitals Value Taken Time  BP 120/74 12/25/23 0905  Temp 37.1 C 12/25/23 0905  Pulse 72 12/25/23 0905  Resp 15 12/25/23 0905  SpO2 98 % 12/25/23 0905    Last Pain:  Vitals:   12/25/23 0905  TempSrc: Oral  PainSc: 0-No pain         Complications: No notable events documented.

## 2023-12-25 NOTE — Interval H&P Note (Signed)
 History and Physical Interval Note:  12/25/2023 7:31 AM  Regina Richardson  has presented today for surgery, with the diagnosis of ANEMIA.  The various methods of treatment have been discussed with the patient and family. After consideration of risks, benefits and other options for treatment, the patient has consented to  Procedure(s) with comments: COLONOSCOPY (N/A) - ASA 3 as a surgical intervention.  The patient's history has been reviewed, patient examined, no change in status, stable for surgery.  I have reviewed the patient's chart and labs.  Questions were answered to the patient's satisfaction.     Vinetta Greening

## 2023-12-26 ENCOUNTER — Ambulatory Visit (HOSPITAL_COMMUNITY)
Admission: RE | Admit: 2023-12-26 | Discharge: 2023-12-26 | Disposition: A | Discharge status: Home / Self Care | Source: Ambulatory Visit | Attending: Internal Medicine | Admitting: Internal Medicine

## 2023-12-26 ENCOUNTER — Inpatient Hospital Stay: Attending: Oncology

## 2023-12-26 ENCOUNTER — Encounter (HOSPITAL_COMMUNITY): Payer: Self-pay | Admitting: Internal Medicine

## 2023-12-26 DIAGNOSIS — R21 Rash and other nonspecific skin eruption: Secondary | ICD-10-CM | POA: Insufficient documentation

## 2023-12-26 DIAGNOSIS — D509 Iron deficiency anemia, unspecified: Secondary | ICD-10-CM | POA: Insufficient documentation

## 2023-12-26 DIAGNOSIS — D500 Iron deficiency anemia secondary to blood loss (chronic): Secondary | ICD-10-CM

## 2023-12-26 DIAGNOSIS — Z8501 Personal history of malignant neoplasm of esophagus: Secondary | ICD-10-CM | POA: Diagnosis not present

## 2023-12-26 DIAGNOSIS — K7030 Alcoholic cirrhosis of liver without ascites: Secondary | ICD-10-CM | POA: Diagnosis not present

## 2023-12-26 LAB — COMPREHENSIVE METABOLIC PANEL WITH GFR
ALT: 39 U/L (ref 0–44)
AST: 38 U/L (ref 15–41)
Albumin: 3.7 g/dL (ref 3.5–5.0)
Alkaline Phosphatase: 209 U/L — ABNORMAL HIGH (ref 38–126)
Anion gap: 5 (ref 5–15)
BUN: 6 mg/dL — ABNORMAL LOW (ref 8–23)
CO2: 28 mmol/L (ref 22–32)
Calcium: 9.1 mg/dL (ref 8.9–10.3)
Chloride: 107 mmol/L (ref 98–111)
Creatinine, Ser: 0.62 mg/dL (ref 0.44–1.00)
GFR, Estimated: >60 mL/min
Glucose, Bld: 90 mg/dL (ref 70–99)
Potassium: 3.2 mmol/L — ABNORMAL LOW (ref 3.5–5.1)
Sodium: 140 mmol/L (ref 135–145)
Total Bilirubin: 0.6 mg/dL (ref 0.0–1.2)
Total Protein: 7.4 g/dL (ref 6.5–8.1)

## 2023-12-26 LAB — CBC WITH DIFFERENTIAL/PLATELET
Abs Immature Granulocytes: 0.1 10*3/uL — ABNORMAL HIGH (ref 0.00–0.07)
Band Neutrophils: 1 %
Basophils Absolute: 0.1 10*3/uL (ref 0.0–0.1)
Basophils Relative: 2 %
Eosinophils Absolute: 0.1 10*3/uL (ref 0.0–0.5)
Eosinophils Relative: 1 %
HCT: 34.9 % — ABNORMAL LOW (ref 36.0–46.0)
Hemoglobin: 10.4 g/dL — ABNORMAL LOW (ref 12.0–15.0)
Lymphocytes Relative: 22 %
Lymphs Abs: 1.5 10*3/uL (ref 0.7–4.0)
MCH: 25.9 pg — ABNORMAL LOW (ref 26.0–34.0)
MCHC: 29.8 g/dL — ABNORMAL LOW (ref 30.0–36.0)
MCV: 86.8 fL (ref 80.0–100.0)
Metamyelocytes Relative: 1 %
Monocytes Absolute: 0.1 10*3/uL (ref 0.1–1.0)
Monocytes Relative: 2 %
Neutro Abs: 4.8 10*3/uL (ref 1.7–7.7)
Neutrophils Relative %: 71 %
Platelets: 213 10*3/uL (ref 150–400)
RBC: 4.02 MIL/uL (ref 3.87–5.11)
WBC: 6.7 10*3/uL (ref 4.0–10.5)
nRBC: 0 % (ref 0.0–0.2)

## 2023-12-26 LAB — FERRITIN: Ferritin: 116 ng/mL (ref 11–307)

## 2023-12-26 LAB — IRON AND TIBC
Iron: 42 ug/dL (ref 28–170)
Saturation Ratios: 13 % (ref 10.4–31.8)
TIBC: 326 ug/dL (ref 250–450)
UIBC: 284 ug/dL

## 2023-12-26 LAB — FOLATE: Folate: 20 ng/mL

## 2023-12-26 LAB — VITAMIN B12: Vitamin B-12: 274 pg/mL (ref 180–914)

## 2023-12-26 NOTE — Anesthesia Postprocedure Evaluation (Signed)
 Anesthesia Post Note  Patient: Regina Richardson  Procedure(s) Performed: COLONOSCOPY  Patient location during evaluation: Phase II Anesthesia Type: General Level of consciousness: awake Pain management: pain level controlled Vital Signs Assessment: post-procedure vital signs reviewed and stable Respiratory status: spontaneous breathing and respiratory function stable Cardiovascular status: blood pressure returned to baseline and stable Postop Assessment: no headache and no apparent nausea or vomiting Anesthetic complications: no Comments: Late entry   No notable events documented.   Last Vitals:  Vitals:   12/25/23 0713 12/25/23 0905  BP: (!) 148/60 120/74  Pulse: (!) 54 72  Resp: (!) 23 15  Temp: 36.8 C 37.1 C  SpO2: 99% 98%    Last Pain:  Vitals:   12/26/23 1353  TempSrc:   PainSc: 0-No pain                 Coretha Dew

## 2023-12-27 ENCOUNTER — Inpatient Hospital Stay

## 2024-01-03 ENCOUNTER — Inpatient Hospital Stay (HOSPITAL_BASED_OUTPATIENT_CLINIC_OR_DEPARTMENT_OTHER): Admitting: Oncology

## 2024-01-03 VITALS — BP 134/74 | HR 68 | Temp 97.8°F | Resp 18 | Wt 155.3 lb

## 2024-01-03 DIAGNOSIS — D500 Iron deficiency anemia secondary to blood loss (chronic): Secondary | ICD-10-CM | POA: Diagnosis not present

## 2024-01-03 DIAGNOSIS — D649 Anemia, unspecified: Secondary | ICD-10-CM | POA: Diagnosis not present

## 2024-01-03 DIAGNOSIS — C159 Malignant neoplasm of esophagus, unspecified: Secondary | ICD-10-CM

## 2024-01-03 DIAGNOSIS — K7030 Alcoholic cirrhosis of liver without ascites: Secondary | ICD-10-CM

## 2024-01-03 DIAGNOSIS — D509 Iron deficiency anemia, unspecified: Secondary | ICD-10-CM | POA: Diagnosis not present

## 2024-01-03 DIAGNOSIS — R21 Rash and other nonspecific skin eruption: Secondary | ICD-10-CM | POA: Insufficient documentation

## 2024-01-03 MED ORDER — VITAMIN B-12 1000 MCG PO TABS: 1000.0000 ug | ORAL_TABLET | Freq: Every day | ORAL | 2 refills | Status: DC

## 2024-01-03 MED ORDER — TERBINAFINE HCL 1 % EX CREA: 1.0000 | TOPICAL_CREAM | Freq: Two times a day (BID) | CUTANEOUS | 0 refills | Status: AC

## 2024-01-03 NOTE — Patient Instructions (Signed)
 VISIT SUMMARY:  Today, we discussed your anemia, swollen feet, and medication refill issues. We reviewed your recent colonoscopy results and addressed your concerns about your feet and overall health.  YOUR PLAN:  -ANEMIA: Anemia is a condition where you don't have enough healthy red blood cells to carry adequate oxygen to your body's tissues. Your iron  levels have improved but are not yet normal. Continue taking your iron  supplements every other day and start taking a daily vitamin B12 supplement. We will also do a blood test to rule out multiple myeloma, a type of blood cancer. Please schedule a follow-up appointment in three months for repeat labs.  -FUNGAL INFECTION OF THE FEET: You have a red, swollen, and painful rash on your feet, likely due to a fungal infection. This is not consistent with gout. We will prescribe an antifungal cream for your feet and refer you to a dermatologist for further evaluation.  INSTRUCTIONS:  Please schedule a follow-up appointment in three months for repeat labs to monitor your anemia. Contact your primary care physician for medication refills to avoid any gaps in your treatment. Use the prescribed antifungal cream on your feet and follow up with a dermatologist for further evaluation.

## 2024-01-03 NOTE — Assessment & Plan Note (Signed)
 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 Being followed by Dr. Mordechai April Recent ultrasound showing cirrhosis but no HCC  - Continue to follow with Dr.Carver

## 2024-01-03 NOTE — Assessment & Plan Note (Signed)
 Patient had a history of esophageal squamous cell carcinoma in situ with no invasive disease S/p mucosal endoscopic dissection in 02/2021 Getting frequent endoscopies with GI  - Continue to follow with GI

## 2024-01-03 NOTE — Assessment & Plan Note (Signed)
 The most likely cause of her anemia is due to chronic blood loss. Last colonoscopy/endoscopy: - Endoscopy: 06/2023: Showing scar in the middle third of the esophagus - Colonoscopy: 01/10/2024: Showed angio dysplastic lesions s/p APC S/p IV iron  with improvement in iron  labs and slight improvement in hemoglobin  -Continue taking oral iron  every other day - If anemia does not improve, will workup for multiple myeloma  Return to clinic in 3 months with labs

## 2024-01-03 NOTE — Progress Notes (Signed)
 [facility]  HEMATOLOGY FOLLOW-UP VISIT  Regina Richardson, Regina Clay, FNP  REASON FOR FOLLOW-UP: Iron  deficiency anemia  ASSESSMENT & PLAN:  Patient is a 63 y.o. female following for iron  deficiency anemia  Squamous cell carcinoma of esophagus/Moderate to severe squamous dysplasia.- March 2022 Patient had a history of esophageal squamous cell carcinoma in situ with no invasive disease S/p mucosal endoscopic dissection in 02/2021 Getting frequent endoscopies with GI  - Continue to follow with GI  IDA (iron  deficiency anemia) The most likely cause of her anemia is due to chronic blood loss. 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 Being followed by Dr. Mordechai Richardson Recent ultrasound showing cirrhosis but no HCC  - Continue to follow with Regina Richardson  Rash Patient has erythematous papular rash on bilateral feet Appears like tenia pedis  - Will prescribe terbinafine cream - Will refer to dermatology for further evaluation   Orders Placed This Encounter  Procedures   Multiple Myeloma Panel (SPEP&IFE w/QIG)    Standing Status:   Future    Expected Date:   03/30/2024    Expiration Date:   06/28/2024   Kappa/lambda light chains    Standing Status:   Future    Expected Date:   03/30/2024    Expiration Date:   06/28/2024   Iron  and TIBC    Standing Status:   Future    Expected Date:   01/03/2024    Expiration Date:   04/04/2024   Comprehensive metabolic panel with GFR    Standing Status:   Future    Expected Date:   04/04/2024     Expiration Date:   01/02/2025   CBC with Differential/Platelet    Standing Status:   Future    Expected Date:   04/04/2024    Expiration Date:   01/02/2025   Vitamin B12    Standing Status:   Future    Expected Date:   04/04/2024    Expiration Date:   01/02/2025   Folate    Standing Status:   Future    Expected Date:   04/04/2024    Expiration Date:   01/02/2025   Ferritin    Standing Status:   Future    Expected Date:   04/04/2024    Expiration Date:   01/02/2025    The total time spent in the appointment was 30 minutes encounter with patients including review of chart and various tests results, discussions about plan of care and coordination of care plan   All questions were answered. The patient knows to call the clinic with any problems, questions or concerns. No barriers to learning was detected.  Regina Grade, MD 6/13/20252:34 PM   SUMMARY OF HEMATOLOGIC HISTORY: Iron  deficiency anemia likely secondary to chronic GI bleeding - S/p IV iron  Venofer  500 mg X2 doses 11/27/2023 and 12/05/2023   INTERVAL HISTORY: Regina Richardson 63 y.o. female following for iron  deficiency anemia.  Patient is experiencing persistent pain and rash on her bilateral feet also causing skin peeling.  She does not walk barefoot and always wear shoes and  socks.  She is trying to use lotion on her rash and has been ineffective. She underwent colonoscopy recently revealing bleeding spots that were treated.  She does not see any hematochezia or melena. Patient reports slight improvement after IV iron  but still feels fatigued.  She continues to have swallowing difficulties and was going to [facility] for her follow-ups  I have reviewed the past medical history, past surgical history, social history and family history with the patient   ALLERGIES:  has no known allergies.  MEDICATIONS:  Current Outpatient Medications  Medication Sig Dispense Refill   cyanocobalamin (VITAMIN B12) 1000 MCG tablet Take 1 tablet (1,000  mcg total) by mouth daily. 90 tablet 2   terbinafine (LAMISIL) 1 % cream Apply 1 Application topically 2 (two) times daily. 30 g 0   allopurinol  (ZYLOPRIM ) 300 MG tablet Take 1 tablet (300 mg total) by mouth daily. 90 tablet 3   budesonide -formoterol  (SYMBICORT ) 160-4.5 MCG/ACT inhaler Inhale 2 puffs into the lungs 2 (two) times daily. 1 each 5   colchicine  0.6 MG tablet Take 1 tablet (0.6 mg total) by mouth 2 (two) times daily for 5 days. For gout flare up 10 tablet 0   diclofenac  (VOLTAREN ) 75 MG EC tablet Take 1 tablet (75 mg total) by mouth 2 (two) times daily. 60 tablet 2   DULoxetine  (CYMBALTA ) 60 MG capsule Take 2 capsules (120 mg total) by mouth daily. 180 capsule 1   ferrous sulfate  325 (65 FE) MG EC tablet Take 1 tablet (325 mg total) by mouth every other day. 45 tablet 3   folic acid  (FOLVITE ) 1 MG tablet Take 1 tablet (1 mg total) by mouth daily. 90 tablet 3   gabapentin  (NEURONTIN ) 300 MG capsule Take 1 capsule (300 mg total) by mouth at bedtime. 30 capsule 3   metoprolol  succinate (TOPROL -XL) 100 MG 24 hr tablet Take 1 tablet (100 mg total) by mouth daily. 90 tablet 3   pantoprazole  (PROTONIX ) 40 MG tablet Take 1 tablet (40 mg total) by mouth 2 (two) times daily. 60 tablet 11   QUEtiapine  (SEROQUEL ) 100 MG tablet TAKE 1 TABLET BY MOUTH EVERYDAY AT BEDTIME 90 tablet 1   rosuvastatin  (CRESTOR ) 20 MG tablet Take 1 tablet (20 mg total) by mouth daily. 90 tablet 3   umeclidinium-vilanterol (ANORO ELLIPTA ) 62.5-25 MCG/ACT AEPB Inhale 1 puff into the lungs in the morning.     No current facility-administered medications for this visit.     REVIEW OF SYSTEMS:   Constitutional: Denies fevers, chills or night sweats Eyes: Denies blurriness of vision Ears, nose, mouth, throat, and face: Denies mucositis or sore throat Respiratory: Denies cough, dyspnea or wheezes Cardiovascular: Denies palpitation, chest discomfort or lower extremity swelling Gastrointestinal:  Denies nausea, heartburn or  change in bowel habits Skin: Denies abnormal skin rashes Lymphatics: Denies new lymphadenopathy or easy bruising Neurological:Denies numbness, tingling or new weaknesses Behavioral/Psych: Mood is stable, no new changes  All other systems were reviewed with the patient and are negative.  PHYSICAL EXAMINATION:   Vitals:   01/03/24 0831  BP: 134/74  Pulse: 68  Resp: 18  Temp: 97.8 F (36.6 C)  SpO2: 99%    GENERAL:alert, no distress and comfortable SKIN: Erythematous, papular diffuse rash over bilateral feet, some peeling dry skin, no discharge LUNGS: clear to auscultation and percussion with normal breathing effort HEART: regular rate & rhythm and no murmurs and no lower extremity edema ABDOMEN:abdomen soft, non-tender and normal bowel sounds Musculoskeletal:no cyanosis of digits and no  clubbing  NEURO: alert & oriented x 3 with fluent speech  LABORATORY DATA:  I have reviewed the data as listed  Lab Results  Component Value Date   WBC 6.7 12/26/2023   NEUTROABS 4.8 12/26/2023   HGB 10.4 (L) 12/26/2023   HCT 34.9 (L) 12/26/2023   MCV 86.8 12/26/2023   PLT 213 12/26/2023       Chemistry      Component Value Date/Time   NA 140 12/26/2023 0912   NA 140 11/08/2023 1115   K 3.2 (L) 12/26/2023 0912   CL 107 12/26/2023 0912   CO2 28 12/26/2023 0912   BUN 6 (L) 12/26/2023 0912   BUN 5 (L) 11/08/2023 1115   CREATININE 0.62 12/26/2023 0912   CREATININE 0.43 (L) 05/04/2019 1108      Component Value Date/Time   CALCIUM  9.1 12/26/2023 0912   ALKPHOS 209 (H) 12/26/2023 0912   AST 38 12/26/2023 0912   ALT 39 12/26/2023 0912   BILITOT 0.6 12/26/2023 0912   BILITOT <0.2 11/08/2023 1115      Latest Reference Range & Units 12/26/23 09:12  Iron  28 - 170 ug/dL 42  UIBC ug/dL 409  TIBC 811 - 914 ug/dL 782  Saturation Ratios 10.4 - 31.8 % 13  Ferritin 11 - 307 ng/mL 116  Folate >5.9 ng/mL 20.0  Vitamin B12 180 - 914 pg/mL 274   Colonoscopy: 12/25/2023: Impression:  -  Non- bleeding internal hemorrhoids.  - Two bleeding colonic angiodysplastic lesions. Treated with argon plasma coagulation ( APC) .  - Seven non- bleeding colonic angiodysplastic lesions. Treated with argon plasma coagulation ( APC) .  - No specimens collected.  RADIOGRAPHIC STUDIES: I have personally reviewed the radiological images as listed and agreed with the findings in the report.  US  Abdomen Limited RUQ (LIVER/GB) CLINICAL DATA:  Cirrhosis, HCC screening  EXAM: ULTRASOUND ABDOMEN LIMITED RIGHT UPPER QUADRANT  COMPARISON:  May 01, 2023  FINDINGS: Gallbladder:  Cholelithiasis. No wall thickening visualized. No sonographic Murphy sign noted by sonographer.  Common bile duct:  Diameter: Visualized portion measures 3 mm, within normal limits.  Liver:  No focal lesion identified. Heterogeneous and coarsened in parenchymal echogenicity. Nodular liver contours. Portal vein is patent on color Doppler imaging with normal direction of blood flow towards the liver.  Other: None.  IMPRESSION: 1. Cirrhotic liver morphology. No focal lesion identified. 2. Cholelithiasis without sonographic evidence of acute cholecystitis.  Electronically Signed   By: Clancy Crimes M.D.   On: 12/28/2023 14:23

## 2024-01-03 NOTE — Assessment & Plan Note (Signed)
 Patient has erythematous papular rash on bilateral feet Appears like tenia pedis  - Will prescribe terbinafine cream - Will refer to dermatology for further evaluation

## 2024-01-05 ENCOUNTER — Ambulatory Visit: Payer: Self-pay | Admitting: Internal Medicine

## 2024-01-13 ENCOUNTER — Encounter: Payer: Self-pay | Admitting: Internal Medicine

## 2024-01-15 ENCOUNTER — Telehealth: Payer: Self-pay

## 2024-01-15 NOTE — Progress Notes (Signed)
 Complex Care Management Note  Care Guide Note 01/15/2024 Name: JOHNIECE HORNBAKER MRN: [id number] DOB: 1960/11/04  Grayce CHRISTELLA Schmitt is a 63 y.o. year old female who sees Del Wilhelmena Falter, Hilario, FNP for primary care. I reached out to Grayce CHRISTELLA Schmitt by phone today to offer complex care management services.  Ms. Luebke was given information about Complex Care Management services today including:   The Complex Care Management services include support from the care team which includes your Nurse Care Manager, Clinical Social Worker, or Pharmacist.  The Complex Care Management team is here to help remove barriers to the health concerns and goals most important to you. Complex Care Management services are voluntary, and the patient may decline or stop services at any time by request to their care team member.   Complex Care Management Consent Status: Patient agreed to services and verbal consent obtained.   Follow up plan:  Telephone appointment with complex care management team member scheduled for:  LCSW 01/22/2024  Encounter Outcome:  Patient Scheduled  Jeoffrey Buffalo , RMA     [place]  [facility], [facility] Guide  Direct Dial: [phone]  Website: [email or web address]

## 2024-01-15 NOTE — Progress Notes (Signed)
 Care Guide Pharmacy Note  01/15/2024 Name: Regina Richardson MRN: [id number] DOB: 09-05-1960  Referred By: Terry Wilhelmena Lloyd Hilario, FNP Reason for referral: Complex Care Management (Outreach to schedule with LCSW and Pharm d )   [name] is a 63 y.o. year old female who is a primary care patient of Del Wilhelmena Lloyd Hilario, FNP.  BREIANA STRATMANN was referred to the pharmacist for assistance related to: HTN, Anxiety, and Depression  Successful contact was made with the patient to discuss pharmacy services including being ready for the pharmacist to call at least 5 minutes before the scheduled appointment time and to have medication bottles and any blood pressure readings ready for review. The patient agreed to meet with the pharmacist via telephone visit on (date/time).01/17/2024  Jeoffrey Buffalo , RMA     [place]  [facility], [facility] Guide  Direct Dial: [phone]  Website: [email or web address]

## 2024-01-17 ENCOUNTER — Other Ambulatory Visit: Payer: Self-pay

## 2024-01-17 NOTE — Progress Notes (Signed)
 01/17/2024 Name: Regina Richardson MRN: [id number] DOB: 1961/02/09  Chief Complaint  Patient presents with   Medication Management   Medication Assistance    Regina Richardson is a 63 y.o. year old female who presented for a telephone visit.   They were referred to the pharmacist by their PCP for assistance in managing medication access.    Subjective:  Care Team: Primary Care Provider: Terry Wilhelmena Lloyd Hilario, FNP ; Next Scheduled Visit:  Future Appointments  Date Time Provider Department Center  01/22/2024 10:30 AM Merlynn Lyle CROME, LCSW [facility] None  03/12/2024 10:00 AM Del Wilhelmena Lloyd, [place], [place] [facility]  03/18/2024  2:30 PM RPC-ANNUAL WELLNESS VISIT RPC-RPC RPC  04/03/2024  8:45 AM AP-ACAPA LAB [facility]AP[facility] None  04/10/2024 10:30 AM Burns, Delon BRAVO, NP [facility] None    Medication Access/Adherence  Current Pharmacy:  [phone] - [place], [place] - [address] [place] [address] Phone: [phone] Fax: [phone]   Patient reports affordability concerns with their medications: Yes  Patient reports access/transportation concerns to their pharmacy: No  Patient reports adherence concerns with their medications:  Yes  - issues are related to copays being too expensive. Has not been using an inhaler because of this. Reports diagnosis of COPD. Previously given symbicort . Cannot walk to the mail box and back without being extremely SOB.     Objective:  Lab Results  Component Value Date   HGBA1C 5.5 11/08/2023    Lab Results  Component Value Date   CREATININE 0.62 12/26/2023   BUN 6 (L) 12/26/2023   NA 140 12/26/2023   K 3.2 (L) 12/26/2023   CL 107 12/26/2023   CO2 28 12/26/2023    Lab Results  Component Value Date   CHOL 120 11/08/2023   HDL 56 11/08/2023   LDLCALC 45 11/08/2023   TRIG 102 11/08/2023   CHOLHDL 2.1 11/08/2023    Medications Reviewed Today     Reviewed by Pandora Cadet, [facility] (Pharmacist) on 01/17/24  at 0941  Med List Status: <None>   Medication Order Taking? Sig Documenting Provider Last Dose Status Informant  allopurinol  (ZYLOPRIM ) 300 MG tablet [id number] No Take 1 tablet (300 mg total) by mouth daily. Zollie Lowers, MD 12/24/2023 Active   budesonide -formoterol  (SYMBICORT ) 160-4.5 MCG/ACT inhaler [id number] No Inhale 2 puffs into the lungs 2 (two) times daily. Zollie Lowers, MD Unknown Active            Med Note ZARA, [facility] M   Wed Apr 24, 2023 11:14 AM) Did not pick up due to cost   colchicine  0.6 MG tablet [id number] No Take 1 tablet (0.6 mg total) by mouth 2 (two) times daily for 5 days. For gout flare up Terry Wilhelmena Lloyd Hilario, FNP Taking Expired 12/18/23 2359            Med Note (SUTTON, CRYSTAL L   Wed Dec 18, 2023  1:44 PM) As needed.  cyanocobalamin  (VITAMIN B12) 1000 MCG tablet [id number]  Take 1 tablet (1,000 mcg total) by mouth daily. Kandala, Hyndavi, MD  Active   diclofenac  (VOLTAREN ) 75 MG EC tablet [id number] No Take 1 tablet (75 mg total) by mouth 2 (two) times daily. Del Wilhelmena Lloyd, Hilario, FNP Taking Active   DULoxetine  (CYMBALTA ) 60 MG capsule [id number] No Take 2 capsules (120 mg total) by mouth daily. [address] Lloyd Hilario, [place] 12/24/2023 Active   ferrous sulfate  325 (65 FE) MG EC tablet [id number] No Take 1 tablet (325 mg  total) by mouth every other day. Davonna Siad, MD Past Week Active   folic acid  (FOLVITE ) 1 MG tablet [id number] No Take 1 tablet (1 mg total) by mouth daily. Del Wilhelmena Falter, Hilario, FNP Past Month Active   gabapentin  (NEURONTIN ) 300 MG capsule [id number] No Take 1 capsule (300 mg total) by mouth at bedtime. Del Wilhelmena Falter, Hilario, FNP Unknown Active   metoprolol  succinate (TOPROL -XL) 100 MG 24 hr tablet [id number] No Take 1 tablet (100 mg total) by mouth daily. Del Orbe Polanco, Hilario, FNP  4:30 AM Active   pantoprazole  (PROTONIX ) 40 MG tablet [id number] No Take 1 tablet (40 mg total) by mouth 2 (two) times daily. [address] [place], Hilario, FNP  12/24/2023 Active   QUEtiapine  (SEROQUEL ) 100 MG tablet [id number] No TAKE 1 TABLET BY MOUTH EVERYDAY AT BEDTIME Del Wilhelmena Falter Hilario, [place] 12/24/2023 Active            Med Note (SUTTON, CRYSTAL L   Wed Dec 18, 2023  1:45 PM) As needed.  rosuvastatin  (CRESTOR ) 20 MG tablet [id number] No Take 1 tablet (20 mg total) by mouth daily. [address] Falter Hilario, [place] 12/24/2023 Active   terbinafine  (LAMISIL ) 1 % cream [id number]  Apply 1 Application topically 2 (two) times daily. Kandala, Hyndavi, MD  Active   umeclidinium-vilanterol (ANORO ELLIPTA ) 62.5-25 MCG/ACT AEPB [id number] No Inhale 1 puff into the lungs in the morning. [provider] Unknown Active             Assessment/Plan:   Medication Management: - Currently strategy sufficient to maintain appropriate adherence to prescribed medication regimen - Suggest use of weekly pill box to organize medications - Reviewed eligibility for patient assistance - would qualify for [facility] patient assistance. - Discussed proper administration of breztri and counseled on adherence.   Follow Up Plan: will collaborate with CPhT to get breztri PAP sent to patient   Lang Sieve, PharmD, BCGP Clinical Pharmacist  [phone]

## 2024-01-22 ENCOUNTER — Telehealth: Payer: Self-pay | Admitting: Licensed Clinical Social Worker

## 2024-01-28 ENCOUNTER — Telehealth: Payer: Self-pay

## 2024-01-28 NOTE — Telephone Encounter (Signed)
-----   Message from Inocente JINNY Butcher sent at 01/28/2024  9:24 AM EDT -----  ----- Message ----- From: Pandora Cadet, [facility] Sent: 01/17/2024  10:02 AM EDT To: Rx Med Assistance Team  Please mail [place]&ME program application to patient for Breztri 2 puffs BID. Prescriber is Lliana Del Wilhelmena Falter, FNP at [facility]. Thank you.

## 2024-01-28 NOTE — Progress Notes (Signed)
 Pharmacy Medication Assistance Program Note    03/03/2024  Patient ID: Regina Richardson, female   DOB: 14-Mar-1961, 63 y.o.   MRN: [id number]     01/28/2024  Outreach Medication One  Manufacturer Medication One Nurse, adult Drugs Bretztri  Type of Radiographer, therapeutic Assistance  Date Application Sent to Patient 01/29/2024  Application Items Requested Application;Proof of Income  Date Application Received From Patient 03/03/2024  Application Items Received From Patient Application  Date Application Submitted to Manufacturer 03/03/2024  Method Application Sent to Manufacturer Fax   NEW

## 2024-01-31 ENCOUNTER — Encounter: Payer: Self-pay | Admitting: Licensed Clinical Social Worker

## 2024-01-31 ENCOUNTER — Telehealth: Payer: Self-pay | Admitting: Licensed Clinical Social Worker

## 2024-02-07 ENCOUNTER — Telehealth: Payer: Self-pay

## 2024-02-07 NOTE — Progress Notes (Signed)
 Complex Care Management Care Guide Note  02/07/2024 Name: Regina Richardson MRN: [id number] DOB: Sep 21, 1960  Regina Richardson is a 63 y.o. year old female who is a primary care patient of Del Wilhelmena Lloyd Sola, FNP and is actively engaged with the care management team. I reached out to Regina Richardson by phone today to assist with re-scheduling  with the Licensed Clinical Child psychotherapist.  Follow up plan: Unsuccessful telephone outreach attempt made. A HIPAA compliant phone message was left for the patient providing contact information and requesting a return call.  Jeoffrey Buffalo , RMA     [facility]  [facility], [facility] Guide  Direct Dial: [phone]  Website: [email or web address]

## 2024-02-20 ENCOUNTER — Encounter: Payer: Self-pay | Admitting: Licensed Clinical Social Worker

## 2024-02-20 ENCOUNTER — Telehealth: Payer: Self-pay | Admitting: Licensed Clinical Social Worker

## 2024-02-25 DIAGNOSIS — I2510 Atherosclerotic heart disease of native coronary artery without angina pectoris: Secondary | ICD-10-CM | POA: Diagnosis not present

## 2024-02-25 DIAGNOSIS — F330 Major depressive disorder, recurrent, mild: Secondary | ICD-10-CM | POA: Diagnosis not present

## 2024-02-25 DIAGNOSIS — Z008 Encounter for other general examination: Secondary | ICD-10-CM | POA: Diagnosis not present

## 2024-02-25 DIAGNOSIS — M545 Low back pain, unspecified: Secondary | ICD-10-CM | POA: Diagnosis not present

## 2024-02-25 DIAGNOSIS — Z6829 Body mass index (BMI) 29.0-29.9, adult: Secondary | ICD-10-CM | POA: Diagnosis not present

## 2024-02-25 DIAGNOSIS — E663 Overweight: Secondary | ICD-10-CM | POA: Diagnosis not present

## 2024-02-25 DIAGNOSIS — K7030 Alcoholic cirrhosis of liver without ascites: Secondary | ICD-10-CM | POA: Diagnosis not present

## 2024-02-25 DIAGNOSIS — K219 Gastro-esophageal reflux disease without esophagitis: Secondary | ICD-10-CM | POA: Diagnosis not present

## 2024-02-25 DIAGNOSIS — F1021 Alcohol dependence, in remission: Secondary | ICD-10-CM | POA: Diagnosis not present

## 2024-02-25 DIAGNOSIS — E785 Hyperlipidemia, unspecified: Secondary | ICD-10-CM | POA: Diagnosis not present

## 2024-02-25 DIAGNOSIS — G4700 Insomnia, unspecified: Secondary | ICD-10-CM | POA: Diagnosis not present

## 2024-02-25 DIAGNOSIS — F17210 Nicotine dependence, cigarettes, uncomplicated: Secondary | ICD-10-CM | POA: Diagnosis not present

## 2024-02-25 DIAGNOSIS — I10 Essential (primary) hypertension: Secondary | ICD-10-CM | POA: Diagnosis not present

## 2024-03-03 NOTE — Telephone Encounter (Signed)
 Application submitted.   Please send a new 90 day RX of Breztri  to [facility] pharmacy for patients assistance application.  Thanks!

## 2024-03-04 ENCOUNTER — Other Ambulatory Visit: Payer: Self-pay | Admitting: Family Medicine

## 2024-03-04 MED ORDER — BREZTRI AEROSPHERE 160-9-4.8 MCG/ACT IN AERO: 2.0000 | INHALATION_SPRAY | Freq: Two times a day (BID) | RESPIRATORY_TRACT | 11 refills | Status: AC

## 2024-03-04 NOTE — Telephone Encounter (Signed)
 sent

## 2024-03-06 ENCOUNTER — Institutional Professional Consult (permissible substitution): Payer: Self-pay | Admitting: Professional Counselor

## 2024-03-11 ENCOUNTER — Encounter: Payer: Self-pay | Admitting: Internal Medicine

## 2024-03-12 ENCOUNTER — Ambulatory Visit: Admitting: Family Medicine

## 2024-03-12 ENCOUNTER — Ambulatory Visit (HOSPITAL_COMMUNITY)
Admission: RE | Admit: 2024-03-12 | Discharge: 2024-03-12 | Disposition: A | Discharge status: Home / Self Care | Source: Ambulatory Visit | Attending: Family Medicine | Admitting: Family Medicine

## 2024-03-12 ENCOUNTER — Encounter: Payer: Self-pay | Admitting: Family Medicine

## 2024-03-12 ENCOUNTER — Telehealth: Payer: Self-pay | Admitting: Family Medicine

## 2024-03-12 VITALS — BP 134/83 | HR 65 | Resp 16 | Ht 61.0 in | Wt 163.0 lb

## 2024-03-12 DIAGNOSIS — S0993XA Unspecified injury of face, initial encounter: Secondary | ICD-10-CM

## 2024-03-12 DIAGNOSIS — I998 Other disorder of circulatory system: Secondary | ICD-10-CM

## 2024-03-12 DIAGNOSIS — D508 Other iron deficiency anemias: Secondary | ICD-10-CM | POA: Diagnosis not present

## 2024-03-12 DIAGNOSIS — R600 Localized edema: Secondary | ICD-10-CM | POA: Diagnosis not present

## 2024-03-12 DIAGNOSIS — M79672 Pain in left foot: Secondary | ICD-10-CM

## 2024-03-12 DIAGNOSIS — I10 Essential (primary) hypertension: Secondary | ICD-10-CM | POA: Diagnosis not present

## 2024-03-12 DIAGNOSIS — M7732 Calcaneal spur, left foot: Secondary | ICD-10-CM | POA: Diagnosis not present

## 2024-03-12 DIAGNOSIS — S92322A Displaced fracture of second metatarsal bone, left foot, initial encounter for closed fracture: Secondary | ICD-10-CM | POA: Diagnosis not present

## 2024-03-12 DIAGNOSIS — M79606 Pain in leg, unspecified: Secondary | ICD-10-CM | POA: Diagnosis not present

## 2024-03-12 NOTE — Assessment & Plan Note (Signed)
 Continue metoprolol  succinate 100 mg once daily  Labs ordered Continued discussion on DASH diet, low sodium diet and maintain a exercise routine for 150 minutes per week.

## 2024-03-12 NOTE — Progress Notes (Signed)
 Established Patient Office Visit   Subjective  Patient ID: Regina Richardson, female    DOB: 08-25-1960  Age: 63 y.o. MRN: [id number]  Chief Complaint  Patient presents with   Felton Rasmussen on Sunday and bruised her nose and face. Was some bleeding from the nose and the scrape between her eyes bled a lot. Did not go for evaluation. Hurts too bad around her nose to even wear her glasses. Has been taking ibuprofen     She  has a past medical history of Alcoholism (HCC), Anxiety, Apnea (06/11/2021), Arrhythmia, Arthritis, Cancer (HCC), Cirrhosis (HCC) (04/2020), Depression, Dysrhythmia, Emphysema lung (HCC), GERD (gastroesophageal reflux disease), Hypertension, Osteoporosis, PTSD (post-traumatic stress disorder), Scoliosis, and Urinary incontinence.  HPI Patient presents to the clinic for chronic follow up. For the details of today's visit, please refer to assessment and plan.   Review of Systems  Constitutional:  Negative for chills and fever.  Respiratory:  Negative for shortness of breath.   Cardiovascular:  Negative for chest pain.  Musculoskeletal:  Positive for falls.      Objective:     BP 134/83   Pulse 65   Resp 16   Ht 5' 1 (1.549 m)   Wt 163 lb (73.9 kg)   SpO2 98%   BMI 30.80 kg/m  BP Readings from Last 3 Encounters:  03/12/24 134/83  01/03/24 134/74  12/25/23 120/74      Physical Exam Vitals reviewed.  Constitutional:      General: She is not in acute distress.    Appearance: Normal appearance. She is not ill-appearing, toxic-appearing or diaphoretic.  HENT:     Head: Normocephalic. Raccoon eyes and abrasion present.     Nose: Signs of injury and laceration present.     Right Turbinates: Swollen.     Left Turbinates: Swollen.     Right Sinus: Maxillary sinus tenderness and frontal sinus tenderness present.     Left Sinus: Maxillary sinus tenderness and frontal sinus tenderness present.  Eyes:     General:        Right eye: No discharge.        Left  eye: No discharge.     Conjunctiva/sclera: Conjunctivae normal.  Cardiovascular:     Rate and Rhythm: Normal rate.     Pulses: Normal pulses.     Heart sounds: Normal heart sounds.  Pulmonary:     Effort: Pulmonary effort is normal. No respiratory distress.     Breath sounds: Normal breath sounds.  Skin:    General: Skin is warm and dry.  Neurological:     Mental Status: She is alert.     Coordination: Coordination normal.     Gait: Gait normal.  Psychiatric:        Mood and Affect: Mood normal.        Behavior: Behavior normal.      No results found for any visits on 03/12/24.  The ASCVD Risk score (Arnett DK, et al., 2019) failed to calculate for the following reasons:   The valid total cholesterol range is 130 to 320 mg/dL    Assessment & Plan:  Facial injury, initial encounter -     CT MAXILLOFACIAL WO CONTRAST; Future  Other iron  deficiency anemia  Primary hypertension -     BMP8+eGFR -     CBC with Differential/Platelet -     Lipid panel  Left foot pain Assessment & Plan: Xray ordered - awaiting results will follow up Advise  icing, stretching and modifying activities that cause pain. Possible physical therapy referral, shoe inserts and NSAIDs for pain management. Discussed theThe RICE method: R: Rest the painful area for a few days. I: Ice the area for 20 minutes at a time to relieve inflammation. C: Compress the area with a soft wrap to reduce swelling. E: Elevate the area by putting the foot on a few pillows.   Orders: -     DG Foot Complete Left; Future  Ischemic rest pain of lower extremity -     VAS US  ABI WITH/WO TBI; Future  Facial trauma, initial encounter Assessment & Plan: Patient reports falling on Sunday, resulting in bruising to the nose and face. She experienced bleeding from the nose and from a laceration/scrape between the eyes, which bled significantly at the time of injury. She did not seek medical evaluation immediately. Currently reports  significant pain around the nose, making it difficult to wear her glasses. Has been managing pain with ibuprofen. CT Maxillofacial without contrast ordered.   Essential hypertension Assessment & Plan: Continue metoprolol  succinate 100 mg once daily  Labs ordered Continued discussion on DASH diet, low sodium diet and maintain a exercise routine for 150 minutes per week.    Edema of left foot Assessment & Plan: Pain at rest Vas US  lower extremity ordered  Possible PAD      Return in about 4 months (around 07/12/2024), or if symptoms worsen or fail to improve, for hypertension.   Hilario Kidd Wilhelmena Falter, FNP

## 2024-03-12 NOTE — Assessment & Plan Note (Signed)
 Pain at rest Vas US  lower extremity ordered  Possible PAD

## 2024-03-12 NOTE — Assessment & Plan Note (Signed)
 Patient reports falling on Sunday, resulting in bruising to the nose and face. She experienced bleeding from the nose and from a laceration/scrape between the eyes, which bled significantly at the time of injury. She did not seek medical evaluation immediately. Currently reports significant pain around the nose, making it difficult to wear her glasses. Has been managing pain with ibuprofen. CT Maxillofacial without contrast ordered.

## 2024-03-12 NOTE — Assessment & Plan Note (Signed)
Xray ordered - awaiting results will follow up Advise icing, stretching and modifying activities that cause pain. Possible physical therapy referral, shoe inserts and NSAIDs for pain management. Discussed theThe RICE method: R: Rest the painful area for a few days. I: Ice the area for 20 minutes at a time to relieve inflammation. C: Compress the area with a soft wrap to reduce swelling. E: Elevate the area by putting the foot on a few pillows.   

## 2024-03-12 NOTE — Patient Instructions (Signed)

## 2024-03-12 NOTE — Telephone Encounter (Signed)
 Copied from CRM #[id number]. Topic: Referral - Status >> Mar 12, 2024  4:20 PM Zebedee SAUNDERS wrote: Reason for CRM: Pt was told referral #[id number], they do not have a tech for this referral. Please direct and call pt at [phone]

## 2024-03-13 ENCOUNTER — Other Ambulatory Visit: Payer: Self-pay | Admitting: Family Medicine

## 2024-03-13 ENCOUNTER — Telehealth: Payer: Self-pay

## 2024-03-13 ENCOUNTER — Ambulatory Visit: Payer: Self-pay | Admitting: Family Medicine

## 2024-03-13 LAB — CBC WITH DIFFERENTIAL/PLATELET
Basophils Absolute: 0.1 x10E3/uL (ref 0.0–0.2)
Basos: 2 %
EOS (ABSOLUTE): 0.1 x10E3/uL (ref 0.0–0.4)
Eos: 2 %
Hematocrit: 35.2 % (ref 34.0–46.6)
Hemoglobin: 11.1 g/dL (ref 11.1–15.9)
Immature Grans (Abs): 0 x10E3/uL (ref 0.0–0.1)
Immature Granulocytes: 0 %
Lymphocytes Absolute: 2.2 x10E3/uL (ref 0.7–3.1)
Lymphs: 28 %
MCH: 30.4 pg (ref 26.6–33.0)
MCHC: 31.5 g/dL (ref 31.5–35.7)
MCV: 96 fL (ref 79–97)
Monocytes Absolute: 0.6 x10E3/uL (ref 0.1–0.9)
Monocytes: 7 %
Neutrophils Absolute: 4.6 x10E3/uL (ref 1.4–7.0)
Neutrophils: 61 %
Platelets: 206 x10E3/uL (ref 150–450)
RBC: 3.65 x10E6/uL — ABNORMAL LOW (ref 3.77–5.28)
RDW: 13.2 % (ref 11.7–15.4)
WBC: 7.6 x10E3/uL (ref 3.4–10.8)

## 2024-03-13 LAB — BMP8+EGFR
BUN/Creatinine Ratio: 10 — ABNORMAL LOW (ref 12–28)
BUN: 7 mg/dL — ABNORMAL LOW (ref 8–27)
CO2: 25 mmol/L (ref 20–29)
Calcium: 9.2 mg/dL (ref 8.7–10.3)
Chloride: 98 mmol/L (ref 96–106)
Creatinine, Ser: 0.73 mg/dL (ref 0.57–1.00)
Glucose: 89 mg/dL (ref 70–99)
Potassium: 3.3 mmol/L — ABNORMAL LOW (ref 3.5–5.2)
Sodium: 139 mmol/L (ref 134–144)
eGFR: 92 mL/min/1.73 (ref >59)

## 2024-03-13 LAB — LIPID PANEL
Chol/HDL Ratio: 2 ratio (ref 0.0–4.4)
Cholesterol, Total: 165 mg/dL (ref 100–199)
HDL: 84 mg/dL (ref >39)
LDL Chol Calc (NIH): 60 mg/dL (ref 0–99)
Triglycerides: 121 mg/dL (ref 0–149)
VLDL Cholesterol Cal: 21 mg/dL (ref 5–40)

## 2024-03-13 MED ORDER — POTASSIUM CHLORIDE CRYS ER 10 MEQ PO TBCR
10.0000 meq | EXTENDED_RELEASE_TABLET | Freq: Every day | ORAL | 0 refills | Status: DC
Start: 2024-03-13 — End: 2024-03-20

## 2024-03-13 NOTE — Telephone Encounter (Signed)
 Usually we don't order the VAS ABI and have I updated the order to the one we regularly use and notified the provider. Let me know if any issues.

## 2024-03-13 NOTE — Telephone Encounter (Signed)
 Copied from CRM [id number]. Topic: General - Call Back - No Documentation >> Mar 12, 2024  3:52 PM Berwyn MATSU wrote: Reason for CRM:  Patient called in today stating that she received a call today 340pm and no message was left. Per patient she just got home from all the testing and labs that were ordered and she is worried.   Patient is requesting a call back.   May you please assist. >> Mar 12, 2024  4:47 PM Selinda RAMAN wrote: The patient back stating she missed a call. I saw where previous messages were left and told her someone would be in contact with her by tomorrow as the messages were left late this afternoon. She said ok and thank you.

## 2024-03-13 NOTE — Telephone Encounter (Signed)
 Good Morning Ladies :)  Could you shed some light on this please?  It is concerning Patient's Vascular US  :)  Thanks for your help :)

## 2024-03-13 NOTE — Addendum Note (Signed)
 Addended by: Destinee Taber H on: 03/13/2024 03:26 PM   Modules accepted: Orders

## 2024-03-13 NOTE — Telephone Encounter (Signed)
 Called pt back and gave her lab results

## 2024-03-18 ENCOUNTER — Ambulatory Visit

## 2024-03-19 ENCOUNTER — Other Ambulatory Visit: Payer: Self-pay | Admitting: Family Medicine

## 2024-03-19 DIAGNOSIS — E876 Hypokalemia: Secondary | ICD-10-CM

## 2024-03-20 ENCOUNTER — Other Ambulatory Visit: Payer: Self-pay | Admitting: Family Medicine

## 2024-03-20 ENCOUNTER — Ambulatory Visit: Payer: Self-pay | Admitting: Family Medicine

## 2024-03-20 ENCOUNTER — Telehealth: Payer: Self-pay

## 2024-03-20 DIAGNOSIS — E876 Hypokalemia: Secondary | ICD-10-CM

## 2024-03-20 LAB — BMP8+EGFR
BUN/Creatinine Ratio: 9 — ABNORMAL LOW (ref 12–28)
BUN: 7 mg/dL — ABNORMAL LOW (ref 8–27)
CO2: 24 mmol/L (ref 20–29)
Calcium: 9.2 mg/dL (ref 8.7–10.3)
Chloride: 96 mmol/L (ref 96–106)
Creatinine, Ser: 0.76 mg/dL (ref 0.57–1.00)
Glucose: 86 mg/dL (ref 70–99)
Potassium: 2.9 mmol/L — ABNORMAL LOW (ref 3.5–5.2)
Sodium: 139 mmol/L (ref 134–144)
eGFR: 88 mL/min/1.73 (ref >59)

## 2024-03-20 MED ORDER — POTASSIUM CHLORIDE CRYS ER 20 MEQ PO TBCR: 40.0000 meq | EXTENDED_RELEASE_TABLET | Freq: Every day | ORAL | 0 refills | Status: DC

## 2024-03-20 NOTE — Telephone Encounter (Signed)
 Noted

## 2024-03-20 NOTE — Progress Notes (Signed)
 Please inform patient,  Your potassium level is low (2.9 mmol/L). Please take:  2 tablets (20 mEq each) once daily for 2 days, then 1 tablet once daily for 5 days. Take with food and plenty of water .  Why important: Low potassium can cause muscle weakness, cramps, or Abnormal heart rhythms (which can be dangerous)  Follow-up: Come back in 1 week for a blood test (BMP) to check your levels. Take the medication exactly as prescribed. Go to the ER if you develop severe weakness, chest pain, or palpitations.

## 2024-03-20 NOTE — Telephone Encounter (Signed)
 Copied from CRM #[id number]. Topic: Clinical - Lab/Test Results >> Mar 20, 2024  3:18 PM Larissa S wrote: Reason for CRM: Patient returning missed call regarding lab results. Relayed results per provider's note verbatim. Patient verbalized understanding, no additional questions at this time.

## 2024-03-24 NOTE — Progress Notes (Signed)
 Pharmacy Medication Assistance Program Note    03/24/2024  Patient ID: Regina Richardson, female   DOB: 09/30/1960, 63 y.o.   MRN: [id number]     01/28/2024  Outreach Medication One  Manufacturer Medication One Nurse, adult Drugs Bretztri  Type of Radiographer, therapeutic Assistance  Date Application Sent to Patient 01/29/2024  Application Items Requested Application;Proof of Income  Date Application Received From Patient 03/03/2024  Application Items Received From Patient Application  Date Application Submitted to Manufacturer 03/03/2024  Method Application Sent to Manufacturer Fax  Patient Assistance Determination Approved  Approval End Date 07/22/2024     NEW - APPROVED

## 2024-03-25 ENCOUNTER — Other Ambulatory Visit: Payer: Self-pay | Admitting: Family Medicine

## 2024-03-25 DIAGNOSIS — M79672 Pain in left foot: Secondary | ICD-10-CM

## 2024-03-30 ENCOUNTER — Ambulatory Visit: Payer: Self-pay

## 2024-03-30 NOTE — Telephone Encounter (Signed)
 FYI Only or Action Required?: Action required by provider: clinical question for provider and update on patient condition.  Patient was last seen in primary care on 03/12/2024 by Terry Wilhelmena Lloyd Hilario, FNP.  Called Nurse Triage reporting Spasms.  Symptoms began several days ago.  Interventions attempted: Nothing.  Symptoms are: rapidly worsening.  Triage Disposition: Go to ED Now (or PCP Triage)  Patient/caregiver understands and will follow disposition?: No, wishes to speak with PCP-  Patient refusing ED would like to hear from provider regarding possible visit instead. Pt says that the cramping started while she was taking potassium pill last week and worsened over the weekend when she finished the regimen. Call made to CAL, this RN was advised to route HP to clinical team  for f/u  Copied from CRM [id number]. Topic: Clinical - Red Word Triage >> Mar 30, 2024  3:22 PM Charlet HERO wrote: Red Word that prompted transfer to Nurse Triage: Patient is calling stating that she has been taking potassium last week she was suppose to take it for a couple of days and she had muscle cramps in her legs, and she is stating that her middle toes are purple they dont hurt or anything but they are discolored. Reason for Disposition  Patient sounds very sick or weak to the triager    Toes are discolored and numb  Answer Assessment - Initial Assessment Questions 1. ONSET: When did the muscle aches or body pains start?      Muscle cramps started on last week on Monday  2. LOCATION: What part of your body is hurting? (e.g., entire body, arms, legs)      Muscle cramps in legs and toes  3. SEVERITY: How bad is the pain? (Scale 1-10; or mild, moderate, severe)     Moderate-severe  4. CAUSE: What do you think is causing the pains?     Thinks its related to potassium pills  5. FEVER: Do you have a fever? If Yes, ask: What is your temperature, how was it measured, and  when did it start?       No  6. OTHER SYMPTOMS: Do you have any other symptoms? (e.g., chest pain, cold or flu symptoms, rash, weakness, weight loss)     Weakness (for years now)  7. PREGNANCY: Is there any chance you are pregnant? When was your last menstrual period?     No  8. TRAVEL: Have you traveled out of the country in the last month? (e.g., exposures, travel history)     no  Protocols used: Muscle Aches and Body Pain-A-AH

## 2024-04-02 ENCOUNTER — Telehealth: Payer: Self-pay | Admitting: *Deleted

## 2024-04-02 NOTE — Telephone Encounter (Signed)
 Patient called to advise that toes are discolored and numb on right foot.  She spoke to triage nurse with another provider's office on 9/8 and was advised to go to ER or consult with PCP.  Patient states she reached out to PCP and was still awaiting callback.  Did not report to ER or Urgent Care.  Recommended that she follow back up with PCP for further instructions.  Verbalized understanding.

## 2024-04-03 ENCOUNTER — Inpatient Hospital Stay: Attending: Oncology

## 2024-04-03 ENCOUNTER — Other Ambulatory Visit: Payer: Self-pay | Admitting: Oncology

## 2024-04-03 ENCOUNTER — Other Ambulatory Visit: Payer: Self-pay

## 2024-04-03 DIAGNOSIS — R768 Other specified abnormal immunological findings in serum: Secondary | ICD-10-CM | POA: Insufficient documentation

## 2024-04-03 DIAGNOSIS — D509 Iron deficiency anemia, unspecified: Secondary | ICD-10-CM | POA: Diagnosis not present

## 2024-04-03 DIAGNOSIS — E538 Deficiency of other specified B group vitamins: Secondary | ICD-10-CM | POA: Diagnosis not present

## 2024-04-03 DIAGNOSIS — E876 Hypokalemia: Secondary | ICD-10-CM

## 2024-04-03 DIAGNOSIS — Z8501 Personal history of malignant neoplasm of esophagus: Secondary | ICD-10-CM | POA: Diagnosis not present

## 2024-04-03 DIAGNOSIS — D649 Anemia, unspecified: Secondary | ICD-10-CM

## 2024-04-03 LAB — COMPREHENSIVE METABOLIC PANEL WITH GFR
ALT: 33 U/L (ref 0–44)
AST: 41 U/L (ref 15–41)
Albumin: 3.7 g/dL (ref 3.5–5.0)
Alkaline Phosphatase: 165 U/L — ABNORMAL HIGH (ref 38–126)
Anion gap: 10 (ref 5–15)
BUN: 8 mg/dL (ref 8–23)
CO2: 28 mmol/L (ref 22–32)
Calcium: 8 mg/dL — ABNORMAL LOW (ref 8.9–10.3)
Chloride: 98 mmol/L (ref 98–111)
Creatinine, Ser: 0.79 mg/dL (ref 0.44–1.00)
GFR, Estimated: >60 mL/min (ref >60)
Glucose, Bld: 112 mg/dL — ABNORMAL HIGH (ref 70–99)
Potassium: 2.6 mmol/L — CL (ref 3.5–5.1)
Sodium: 136 mmol/L (ref 135–145)
Total Bilirubin: 0.7 mg/dL (ref 0.0–1.2)
Total Protein: 7.6 g/dL (ref 6.5–8.1)

## 2024-04-03 LAB — VITAMIN B12: Vitamin B-12: 969 pg/mL — ABNORMAL HIGH (ref 180–914)

## 2024-04-03 LAB — CBC WITH DIFFERENTIAL/PLATELET
Abs Immature Granulocytes: 0.06 K/uL (ref 0.00–0.07)
Basophils Absolute: 0.1 K/uL (ref 0.0–0.1)
Basophils Relative: 1 %
Eosinophils Absolute: 0.1 K/uL (ref 0.0–0.5)
Eosinophils Relative: 1 %
HCT: 33.9 % — ABNORMAL LOW (ref 36.0–46.0)
Hemoglobin: 11.1 g/dL — ABNORMAL LOW (ref 12.0–15.0)
Immature Granulocytes: 1 %
Lymphocytes Relative: 28 %
Lymphs Abs: 2.5 K/uL (ref 0.7–4.0)
MCH: 31.7 pg (ref 26.0–34.0)
MCHC: 32.7 g/dL (ref 30.0–36.0)
MCV: 96.9 fL (ref 80.0–100.0)
Monocytes Absolute: 0.6 K/uL (ref 0.1–1.0)
Monocytes Relative: 7 %
Neutro Abs: 5.6 K/uL (ref 1.7–7.7)
Neutrophils Relative %: 62 %
Platelets: 220 K/uL (ref 150–400)
RBC: 3.5 MIL/uL — ABNORMAL LOW (ref 3.87–5.11)
RDW: 14.2 % (ref 11.5–15.5)
WBC: 9 K/uL (ref 4.0–10.5)
nRBC: 0 % (ref 0.0–0.2)

## 2024-04-03 LAB — IRON AND TIBC
Iron: 44 ug/dL (ref 28–170)
Saturation Ratios: 12 % (ref 10.4–31.8)
TIBC: 358 ug/dL (ref 250–450)
UIBC: 314 ug/dL

## 2024-04-03 LAB — FOLATE: Folate: >20 ng/mL (ref >5.9)

## 2024-04-03 LAB — FERRITIN: Ferritin: 72 ng/mL (ref 11–307)

## 2024-04-03 MED ORDER — POTASSIUM CHLORIDE CRYS ER 20 MEQ PO TBCR: 40.0000 meq | EXTENDED_RELEASE_TABLET | Freq: Two times a day (BID) | ORAL | 0 refills | Status: AC

## 2024-04-03 NOTE — Progress Notes (Signed)
 Message received from Dorothyann Oman, RN- critical potassium is 2.6 today. Can you call pt and let her know what Randall says  Message received from Delon FORBES Hope, NP- Does she have symptoms? It looks like she is chronically low. She is supposed to be taking potassium 40 meq twice daily.   Message sent to Delon Hope, NP- Called patient and she states that she is having cramps in her back and all the way down her legs. She states that she finished the potassium on Friday. She also mentioned about calling Tomi and telling her about the numbness and tingling in her toes.  Message received from Delon FORBES Hope, NP- I would recommend additional potassium 40 mill equivalents twice daily and to have her labs rechecked early next week. If she has got multiple complaints maybe she should be seen in the emergency room.  Message sent to Delon Hope, NP - Called patient and she is aware of recommendations. She states that in addition to the cramps she is having numbness and tingling, breathing issues, and heart palpitations. Patient advised that she needs to go to the Emergency Department but she refuses to go. Advised that complications of low potassium can be serious. She is agreeable to take the potassium 40meq twice daily and to come in for labs early next week. Amy is scheduling the labs for Monday.

## 2024-04-03 NOTE — Progress Notes (Unsigned)
 CRITICAL VALUE ALERT Critical value received:  K+2.6 Date of notification:  04-03-24 Time of notification: 0937 Critical value read back:  Yes.   Nurse who received alert:  C. Ashtin Rosner RN MD notified time and response:  Delon Hope NP

## 2024-04-06 ENCOUNTER — Inpatient Hospital Stay

## 2024-04-06 LAB — KAPPA/LAMBDA LIGHT CHAINS
Kappa free light chain: 32.4 mg/L — ABNORMAL HIGH (ref 3.3–19.4)
Kappa, lambda light chain ratio: 0.85 (ref 0.26–1.65)
Lambda free light chains: 38.3 mg/L — ABNORMAL HIGH (ref 5.7–26.3)

## 2024-04-08 LAB — MULTIPLE MYELOMA PANEL, SERUM
Albumin SerPl Elph-Mcnc: 3.6 g/dL (ref 2.9–4.4)
Albumin/Glob SerPl: 1.1 (ref 0.7–1.7)
Alpha 1: 0.3 g/dL (ref 0.0–0.4)
Alpha2 Glob SerPl Elph-Mcnc: 0.8 g/dL (ref 0.4–1.0)
B-Globulin SerPl Elph-Mcnc: 1.2 g/dL (ref 0.7–1.3)
Gamma Glob SerPl Elph-Mcnc: 1.2 g/dL (ref 0.4–1.8)
Globulin, Total: 3.4 g/dL (ref 2.2–3.9)
IgA: 468 mg/dL — ABNORMAL HIGH (ref 87–352)
IgG (Immunoglobin G), Serum: 1193 mg/dL (ref 586–1602)
IgM (Immunoglobulin M), Srm: 105 mg/dL (ref 26–217)
Total Protein ELP: 7 g/dL (ref 6.0–8.5)

## 2024-04-10 ENCOUNTER — Telehealth: Payer: Self-pay

## 2024-04-10 ENCOUNTER — Other Ambulatory Visit: Payer: Self-pay | Admitting: Family Medicine

## 2024-04-10 ENCOUNTER — Ambulatory Visit: Payer: Self-pay | Admitting: Oncology

## 2024-04-10 ENCOUNTER — Inpatient Hospital Stay

## 2024-04-10 ENCOUNTER — Inpatient Hospital Stay: Admitting: Oncology

## 2024-04-10 ENCOUNTER — Ambulatory Visit: Admitting: Oncology

## 2024-04-10 VITALS — BP 99/63 | HR 72 | Temp 97.8°F | Resp 18 | Wt 164.7 lb

## 2024-04-10 DIAGNOSIS — E876 Hypokalemia: Secondary | ICD-10-CM

## 2024-04-10 DIAGNOSIS — D500 Iron deficiency anemia secondary to blood loss (chronic): Secondary | ICD-10-CM

## 2024-04-10 DIAGNOSIS — D509 Iron deficiency anemia, unspecified: Secondary | ICD-10-CM | POA: Diagnosis not present

## 2024-04-10 DIAGNOSIS — E878 Other disorders of electrolyte and fluid balance, not elsewhere classified: Secondary | ICD-10-CM

## 2024-04-10 DIAGNOSIS — D649 Anemia, unspecified: Secondary | ICD-10-CM

## 2024-04-10 DIAGNOSIS — C159 Malignant neoplasm of esophagus, unspecified: Secondary | ICD-10-CM | POA: Diagnosis not present

## 2024-04-10 DIAGNOSIS — E538 Deficiency of other specified B group vitamins: Secondary | ICD-10-CM | POA: Diagnosis not present

## 2024-04-10 LAB — POTASSIUM: Potassium: 3.7 mmol/L (ref 3.5–5.1)

## 2024-04-10 NOTE — Telephone Encounter (Signed)
 Order is in patient can walk in

## 2024-04-10 NOTE — Assessment & Plan Note (Deleted)
 Patient has erythematous papular rash on bilateral feet Appears like tenia pedis  - Will prescribe terbinafine cream - Will refer to dermatology for further evaluation

## 2024-04-10 NOTE — Assessment & Plan Note (Addendum)
 The most likely cause of her anemia is due to chronic blood loss. Last colonoscopy/endoscopy: - Endoscopy: 06/2023: Showing scar in the middle third of the esophagus - Colonoscopy: 01/10/2024: Showed angio dysplastic lesions s/p APC S/p IV iron  with improvement in iron  labs and slight improvement in hemoglobin  -Continue taking oral iron  every other day

## 2024-04-10 NOTE — Assessment & Plan Note (Addendum)
 Patient had a history of esophageal squamous cell carcinoma in situ with no invasive disease S/p mucosal endoscopic dissection in 02/2021 Getting frequent endoscopies with GI  - Continue to follow with GI

## 2024-04-10 NOTE — Telephone Encounter (Signed)
 Left detailed vm

## 2024-04-10 NOTE — Patient Instructions (Addendum)
 Stop Folic Acid .   Take B12 and Iron  Tabs every other day.   RTC in 3 months with labs and see NP....JEB

## 2024-04-10 NOTE — Assessment & Plan Note (Signed)
 She is currently taking B12 daily. Reduce to every other day.  Recheck in 4 months.

## 2024-04-10 NOTE — Progress Notes (Signed)
 [facility] OFFICE PROGRESS NOTE  Del Wilhelmena Falter, Hilario, FNP  ASSESSMENT & PLAN:    Assessment & Plan Squamous cell carcinoma of esophagus/Moderate to severe squamous dysplasia.- March 2022 Patient had a history of esophageal squamous cell carcinoma in situ with no invasive disease S/p mucosal endoscopic dissection in 02/2021 Getting frequent endoscopies with GI  - Continue to follow with GI Iron  deficiency anemia due to chronic blood loss The most likely cause of her anemia is due to chronic blood loss. Last colonoscopy/endoscopy: - Endoscopy: 06/2023: Showing scar in the middle third of the esophagus - Colonoscopy: 01/10/2024: Showed angio dysplastic lesions s/p APC S/p IV iron  with improvement in iron  labs and slight improvement in hemoglobin  -Continue taking oral iron  every other day Normocytic anemia Etiology felt to be due to IDA but she was still anemic despite adequate iron  stores.  Myeloma workup was essentially unremarkable but did reveal polyclonal increase in 1 or more immunoglobulins.  This can be found in the setting of any chronic inflammation such as CKD.  Kappa lambda light chain ratio WNL with slight elevation in kappa and lambda free light chains. Monitor for now. Hypokalemia She has been followed by her PCP for this and previously on potassium supplements although she completed prescription last Friday. She was started on additional potassium supplements 40 mEq BID x 1 week with lab redraw today. Vitamin B12 deficiency disease She is currently taking B12 daily. Reduce to every other day.  Recheck in 4 months.  Orders Placed This Encounter  Procedures   Potassium    Standing Status:   Future    Expected Date:   04/10/2024    Expiration Date:   04/10/2025    INTERVAL HISTORY: Patient returns for follow-up.  Patient has had low potassium levels for the last month or so.  She was seen by her PCP who prescribed potassium tablets 40 mEq  daily x 2 days and then 20 mEq daily x 5.  She was found to have a critical potassium of 2.6 last week.  We started her on 40 mill equivalents twice daily x 5 days.  States she is having trouble swallowing the tablets.  Reports some finger and toe cramping.  She was evaluated on 03/12/2024 for fall and facial trauma and injury/swelling of left foot.  Imaging of left foot showed chronic fractures of midshaft of 2nd, 3rd and 4th metatarsal and small plantar and posterior spurs.  She is scheduled for a CT maxillofacial and ABI screening on 05/08/2024.  Having some low back pain.  Reports that she is not sleeping well.  She has felt very little over the past 2 days.  Appetite is 100% energy levels are 25%.  She has chronic stable anxiety/depression.  We reviewed CBC, CMP, multiple myeloma and kappa lambda light chain vitamin B12, folate, ferritin and iron  panel.  SUMMARY OF HEMATOLOGIC HISTORY: Oncology History   No history exists.     CBC    Component Value Date/Time   WBC 9.0 04/03/2024 0846   RBC 3.50 (L) 04/03/2024 0846   HGB 11.1 (L) 04/03/2024 0846   HGB 11.1 03/12/2024 1123   HCT 33.9 (L) 04/03/2024 0846   HCT 35.2 03/12/2024 1123   PLT 220 04/03/2024 0846   PLT 206 03/12/2024 1123   MCV 96.9 04/03/2024 0846   MCV 96 03/12/2024 1123   MCH 31.7 04/03/2024 0846   MCHC 32.7 04/03/2024 0846   RDW 14.2 04/03/2024 0846   RDW 13.2  03/12/2024 1123   LYMPHSABS 2.5 04/03/2024 0846   LYMPHSABS 2.2 03/12/2024 1123   MONOABS 0.6 04/03/2024 0846   EOSABS 0.1 04/03/2024 0846   EOSABS 0.1 03/12/2024 1123   BASOSABS 0.1 04/03/2024 0846   BASOSABS 0.1 03/12/2024 1123       Latest Ref Rng & Units 04/03/2024    8:46 AM 03/19/2024    2:56 PM 03/12/2024   11:23 AM  CMP  Glucose 70 - 99 mg/dL 887  86  89   BUN 8 - 23 mg/dL 8  7  7    Creatinine 0.44 - 1.00 mg/dL 9.20  9.23  9.26   Sodium 135 - 145 mmol/L 136  139  139   Potassium 3.5 - 5.1 mmol/L 2.6  2.9  3.3   Chloride 98 - 111 mmol/L 98   96  98   CO2 22 - 32 mmol/L 28  24  25    Calcium  8.9 - 10.3 mg/dL 8.0  9.2  9.2   Total Protein 6.5 - 8.1 g/dL 7.6     Total Bilirubin 0.0 - 1.2 mg/dL 0.7     Alkaline Phos 38 - 126 U/L 165     AST 15 - 41 U/L 41     ALT 0 - 44 U/L 33        Lab Results  Component Value Date   FERRITIN 72 04/03/2024   VITAMINB12 969 (H) 04/03/2024    Vitals:   04/10/24 1032  BP: 99/63  Pulse: 72  Resp: 18  Temp: 97.8 F (36.6 C)  SpO2: 100%    Review of System:  Review of Systems  Constitutional:  Positive for malaise/fatigue.  Gastrointestinal:  Positive for constipation and diarrhea.  Musculoskeletal:  Positive for back pain, falls and joint pain.  Neurological:  Positive for sensory change and weakness.  Psychiatric/Behavioral:  Positive for depression. The patient is nervous/anxious and has insomnia.     Physical Exam: Physical Exam Constitutional:      Appearance: Normal appearance.  HENT:     Head: Normocephalic and atraumatic.  Eyes:     Pupils: Pupils are equal, round, and reactive to light.  Cardiovascular:     Rate and Rhythm: Normal rate and regular rhythm.     Heart sounds: Normal heart sounds. No murmur heard. Pulmonary:     Effort: Pulmonary effort is normal.     Breath sounds: Normal breath sounds. No wheezing.  Abdominal:     General: Bowel sounds are normal. There is no distension.     Palpations: Abdomen is soft.     Tenderness: There is no abdominal tenderness.  Musculoskeletal:        General: Normal range of motion.     Cervical back: Normal range of motion.  Skin:    General: Skin is warm and dry.     Findings: No rash.  Neurological:     Mental Status: She is alert and oriented to person, place, and time.     Gait: Gait is intact.  Psychiatric:        Mood and Affect: Mood and affect normal.        Cognition and Memory: Memory normal.        Judgment: Judgment normal.      I spent 20 minutes dedicated to the care of this patient  (face-to-face and non-face-to-face) on the date of the encounter to include what is described in the assessment and plan.,  Delon Hope, NP 04/10/2024 10:57 AM

## 2024-04-10 NOTE — Telephone Encounter (Signed)
 Copied from CRM [id number]. Topic: Clinical - Request for Lab/Test Order >> Apr 10, 2024  9:18 AM Winona R wrote: Pt is requesting labs to check her potassium. Please contact the pt when it has been enter into her chart

## 2024-04-10 NOTE — Assessment & Plan Note (Addendum)
 She has been followed by her PCP for this and previously on potassium supplements although she completed prescription last Friday. She was started on additional potassium supplements 40 mEq BID x 1 week with lab redraw today.

## 2024-04-10 NOTE — Assessment & Plan Note (Addendum)
 Etiology felt to be due to IDA but she was still anemic despite adequate iron  stores.  Myeloma workup was essentially unremarkable but did reveal polyclonal increase in 1 or more immunoglobulins.  This can be found in the setting of any chronic inflammation such as CKD.  Kappa lambda light chain ratio WNL with slight elevation in kappa and lambda free light chains. Monitor for now.

## 2024-04-27 ENCOUNTER — Other Ambulatory Visit: Payer: Self-pay | Admitting: Family Medicine

## 2024-04-27 DIAGNOSIS — I10 Essential (primary) hypertension: Secondary | ICD-10-CM

## 2024-04-27 MED ORDER — METOPROLOL SUCCINATE ER 100 MG PO TB24: 100.0000 mg | ORAL_TABLET | Freq: Every day | ORAL | 3 refills | Status: AC

## 2024-04-27 MED ORDER — DULOXETINE HCL 60 MG PO CPEP: 120.0000 mg | ORAL_CAPSULE | Freq: Every day | ORAL | 1 refills | Status: AC

## 2024-04-27 MED ORDER — DICLOFENAC SODIUM 75 MG PO TBEC: 75.0000 mg | DELAYED_RELEASE_TABLET | Freq: Two times a day (BID) | ORAL | 2 refills | Status: DC

## 2024-04-27 NOTE — Telephone Encounter (Signed)
 Copied from CRM [id number]. Topic: Clinical - Medication Refill >> Apr 27, 2024  3:35 PM Delon T wrote: Medication: DULoxetine  (CYMBALTA ) 60 MG capsule  diclofenac  (VOLTAREN ) 75 MG EC tablet  sucralfate  (CARAFATE ) 1 g tablet- does not see prescribing doctor anymore metoprolol  succinate (TOPROL -XL) 100 MG 24 hr tablet  Has the patient contacted their pharmacy? No (Agent: If no, request that the patient contact the pharmacy for the refill. If patient does not wish to contact the pharmacy document the reason why and proceed with request.) (Agent: If yes, when and what did the pharmacy advise?)  This is the patient's preferred pharmacy:  [id number] - [place], [place] - [address] [place] [address] Phone: [phone] Fax: [phone]    Is this the correct pharmacy for this prescription? Yes If no, delete pharmacy and type the correct one.   Has the prescription been filled recently? Yes  Is the patient out of the medication? Yes  Has the patient been seen for an appointment in the last year OR does the patient have an upcoming appointment? Yes  Can we respond through MyChart? Yes  Agent: Please be advised that Rx refills may take up to 3 business days. We ask that you follow-up with your pharmacy.

## 2024-04-27 NOTE — Telephone Encounter (Signed)
 sucralfate  (CARAFATE ) 1 g tablet- does not see prescribing doctor anymore --- Not on active med list

## 2024-05-07 ENCOUNTER — Other Ambulatory Visit: Payer: Self-pay | Admitting: Family Medicine

## 2024-05-08 ENCOUNTER — Ambulatory Visit (HOSPITAL_COMMUNITY)
Admission: RE | Admit: 2024-05-08 | Discharge: 2024-05-08 | Disposition: A | Source: Ambulatory Visit | Attending: Family Medicine

## 2024-05-08 ENCOUNTER — Ambulatory Visit (HOSPITAL_COMMUNITY)
Admission: RE | Admit: 2024-05-08 | Discharge: 2024-05-08 | Disposition: A | Discharge status: Home / Self Care | Source: Ambulatory Visit | Attending: Family Medicine | Admitting: Family Medicine

## 2024-05-08 DIAGNOSIS — S0993XA Unspecified injury of face, initial encounter: Secondary | ICD-10-CM | POA: Diagnosis not present

## 2024-05-08 DIAGNOSIS — M79606 Pain in leg, unspecified: Secondary | ICD-10-CM | POA: Diagnosis not present

## 2024-05-08 DIAGNOSIS — I998 Other disorder of circulatory system: Secondary | ICD-10-CM | POA: Diagnosis not present

## 2024-05-13 ENCOUNTER — Encounter: Payer: Self-pay | Admitting: Oncology

## 2024-05-19 ENCOUNTER — Ambulatory Visit: Admitting: Podiatry

## 2024-06-04 ENCOUNTER — Encounter: Payer: Self-pay | Admitting: Podiatry

## 2024-06-04 ENCOUNTER — Telehealth: Payer: Self-pay | Admitting: *Deleted

## 2024-06-04 ENCOUNTER — Ambulatory Visit (INDEPENDENT_AMBULATORY_CARE_PROVIDER_SITE_OTHER)

## 2024-06-04 ENCOUNTER — Ambulatory Visit: Admitting: Podiatry

## 2024-06-04 DIAGNOSIS — M25571 Pain in right ankle and joints of right foot: Secondary | ICD-10-CM | POA: Diagnosis not present

## 2024-06-04 DIAGNOSIS — M25572 Pain in left ankle and joints of left foot: Secondary | ICD-10-CM

## 2024-06-04 DIAGNOSIS — M7989 Other specified soft tissue disorders: Secondary | ICD-10-CM | POA: Diagnosis not present

## 2024-06-04 DIAGNOSIS — M069 Rheumatoid arthritis, unspecified: Secondary | ICD-10-CM

## 2024-06-04 NOTE — Telephone Encounter (Signed)
 I called and asked the patient to come in about 20 minutes prior to her appointment because she needs xrays of her feet.  She stated she would try but didn't know if she could because she has someone bringing her.  She said her ankles hurt and they have knots on them.

## 2024-06-04 NOTE — Progress Notes (Signed)
  Subjective:  Patient ID: [facility], female    DOB: Sep 13, 1960,   MRN: [id number]  Chief Complaint  Patient presents with   Foot Pain    My left foot is the worse.  I have knots..      63 y.o. female presents for concern of bilateral foot pain but primarily the left foot.  Relates she has had knots on both of her legs for a couple years.  She denies them getting any bigger in size.  She relates the left one does have some tenderness to it.  She relates at times she is unable to walk on her left foot.. Denies any other pedal complaints. Denies n/v/f/c.   Past Medical History:  Diagnosis Date   Alcoholism (HCC)    Anxiety    Apnea 06/11/2021   Arrhythmia    heart   Arthritis    Cancer (HCC)    cervical cancer in the 80s   Cirrhosis (HCC) 04/2020   alcoholic;    Depression    Dysrhythmia    Emphysema lung (HCC)    GERD (gastroesophageal reflux disease)    Hypertension    Osteoporosis    PTSD (post-traumatic stress disorder)    Scoliosis    Urinary incontinence     Objective:  Physical Exam: Vascular: DP/PT pulses 2/4 bilateral. CFT <3 seconds. Normal hair growth on digits. No edema.  Skin. No lacerations or abrasions bilateral feet.  Bilateral soft tissue masses over the distal leg just proximal to the ankle.  Left mass is about 3 cm in diameter and fluctuant nonmobile and tender.  Right mass about 1 cm in diameter also fluctuant and nonmobile.  Nontender to palpation. Musculoskeletal: MMT 5/5 bilateral lower extremities in DF, PF, Inversion and Eversion. Deceased ROM in DF of ankle joint.  Neurological: Sensation intact to light touch.   Assessment:   1. Soft tissue mass      Plan:  Patient was evaluated and treated and all questions answered. X-rays reviewed and discussed with patient.  No acute fractures or dislocations noted.  No osseous changes noted. Discussed soft tissue masses versus ganglion cysts and treatment options with the patient. Discussed would  like to get MRIs for further evaluation of the masses and to rule out any malignant neoplasms. Bilateral MRIs ordered To discussed the plan with patient multiple times.  Patient did initially get sidetracked and not express understanding.  Reiterated plan several more times. Patient to follow-up after MRIs to discuss results.      Asberry Failing, DPM

## 2024-06-08 ENCOUNTER — Encounter: Payer: Self-pay | Admitting: Internal Medicine

## 2024-07-09 ENCOUNTER — Other Ambulatory Visit: Payer: Self-pay | Admitting: Family Medicine

## 2024-07-10 ENCOUNTER — Other Ambulatory Visit: Payer: Self-pay | Admitting: Family Medicine

## 2024-07-10 DIAGNOSIS — E790 Hyperuricemia without signs of inflammatory arthritis and tophaceous disease: Secondary | ICD-10-CM

## 2024-07-10 MED ORDER — VITAMIN B-12 1000 MCG PO TABS: 1000.0000 ug | ORAL_TABLET | Freq: Every day | ORAL | 1 refills | Status: AC

## 2024-07-10 MED ORDER — GABAPENTIN 300 MG PO CAPS: 300.0000 mg | ORAL_CAPSULE | Freq: Every day | ORAL | 1 refills | Status: AC

## 2024-07-10 MED ORDER — DICLOFENAC SODIUM 75 MG PO TBEC: 75.0000 mg | DELAYED_RELEASE_TABLET | Freq: Two times a day (BID) | ORAL | 2 refills | Status: AC

## 2024-07-10 MED ORDER — ALLOPURINOL 300 MG PO TABS: 300.0000 mg | ORAL_TABLET | Freq: Every day | ORAL | 1 refills | Status: AC

## 2024-07-10 NOTE — Telephone Encounter (Signed)
 Copied from CRM #[id number]. Topic: Clinical - Medication Refill >> Jul 10, 2024  9:18 AM Tanazia G wrote: Medication:  diclofenac  (VOLTAREN ) 75 MG EC tablet  gabapentin  (NEURONTIN ) 300 MG capsule allopurinol  (ZYLOPRIM ) 300 MG tablet cyanocobalamin  (VITAMIN B12) 1000 MCG tablet   Patient has already went one week without her medication and is requesting an urgent refill today.  Has the patient contacted their pharmacy? Yes (Agent: If no, request that the patient contact the pharmacy for the refill. If patient does not wish to contact the pharmacy document the reason why and proceed with request.) (Agent: If yes, when and what did the pharmacy advise?)  This is the patient's preferred pharmacy:  [id number] - [place], [place] - [address] [place] [address] Phone: [phone] Fax: [phone]   Is this the correct pharmacy for this prescription? Yes If no, delete pharmacy and type the correct one.   Has the prescription been filled recently? Yes  Is the patient out of the medication? Yes  Has the patient been seen for an appointment in the last year OR does the patient have an upcoming appointment? Yes  Can we respond through MyChart? Yes  Agent: Please be advised that Rx refills may take up to 3 business days. We ask that you follow-up with your pharmacy.

## 2024-07-13 ENCOUNTER — Ambulatory Visit: Admitting: Nurse Practitioner

## 2024-07-31 ENCOUNTER — Other Ambulatory Visit: Payer: Self-pay | Admitting: *Deleted

## 2024-07-31 ENCOUNTER — Encounter: Payer: Self-pay | Admitting: *Deleted

## 2024-07-31 DIAGNOSIS — Z87891 Personal history of nicotine dependence: Secondary | ICD-10-CM

## 2024-07-31 DIAGNOSIS — F17210 Nicotine dependence, cigarettes, uncomplicated: Secondary | ICD-10-CM

## 2024-07-31 DIAGNOSIS — Z122 Encounter for screening for malignant neoplasm of respiratory organs: Secondary | ICD-10-CM

## 2024-08-07 ENCOUNTER — Inpatient Hospital Stay: Attending: Oncology

## 2024-08-14 ENCOUNTER — Inpatient Hospital Stay: Admitting: Oncology

## 2024-08-17 ENCOUNTER — Ambulatory Visit: Payer: Self-pay

## 2024-08-17 NOTE — Telephone Encounter (Signed)
 Attempted to call pt x2. VM left for pt. Will attempt to contact pt at a later time.   Back pain, difficulty walking, possible broken rib, and pain with breathing after falling off her porch.

## 2024-08-17 NOTE — Telephone Encounter (Addendum)
 This RN called to clarify recent appts scheduled for pt on 1/27 ([facility]) and on 1/28 ([facility]) for the following symptoms:  Back pain, difficulty walking, possible broken rib, and pain with breathing after falling off her porch.   Called to obtain more information about pts symptoms, and to clarify double appts at 2 different offices. Unable to reach pt, LVM to call office back. Routing to callbacks for further attempts.

## 2024-08-18 ENCOUNTER — Ambulatory Visit

## 2024-08-19 ENCOUNTER — Ambulatory Visit

## 2024-08-27 ENCOUNTER — Ambulatory Visit: Payer: Self-pay

## 2024-08-27 NOTE — Telephone Encounter (Signed)
 noted

## 2024-08-27 NOTE — Telephone Encounter (Signed)
 FYI Only or Action Required?: FYI only for provider: appointment scheduled on 08/31/24.  Patient was last seen in primary care on 03/12/2024 by Terry Wilhelmena Lloyd Hilario, FNP.  Called Nurse Triage reporting Back Pain.  Symptoms began ' before Christmas.  Interventions attempted: OTC medications: aspirin and Ice/heat application.  Symptoms are: unchanged.  Triage Disposition: See Physician Within 24 Hours- pt stated she cannot get out because of the roads wanted an an appt next week.   Patient/caregiver understands and will follow disposition?: Yes    Message from Ivette P sent at 08/27/2024 11:28 AM EST  Reason for Triage: back pain - unable to walk. walking and all of sudden pain will come out of no where.   Wants to know if ok to take Asprin for pain.   Reason for Disposition  Numbness in a leg or foot (i.e., loss of sensation)  Answer Assessment - Initial Assessment Questions 1. ONSET: When did the pain begin? (e.g., minutes, hours, days)     10 years  2. LOCATION: Where does it hurt? (upper, mid or lower back)     Middle of back  and to either sides 3. SEVERITY: How bad is the pain?  (e.g., Scale 1-10; mild, moderate, or severe)     severe 4. PATTERN: Is the pain constant? (e.g., yes, no; constant, intermittent)      No - intermittent 5. RADIATION: Does the pain shoot into your legs or somewhere else?    Runs down either leg  6. CAUSE:  What do you think is causing the back pain?      H/o scoliosis 7. BACK OVERUSE:  Any recent lifting of heavy objects, strenuous work or exercise?     *No Answer* 8. MEDICINES: What have you taken so far for the pain? (e.g., nothing, acetaminophen , NSAIDS)     Pill for muscle spasms 9. NEUROLOGIC SYMPTOMS: Do you have any weakness, numbness, or problems with bowel/bladder control?     Feet numb 10. OTHER SYMPTOMS: Do you have any other symptoms? (e.g., fever, abdomen pain, burning with urination, blood in urine)        Swelling, sudden onset and comes and goes- stated that she fell before Christmas and began having pains 11. PREGNANCY: Is there any chance you are pregnant? When was your last menstrual period?       N/a  Protocols used: Back Pain-A-AH

## 2024-08-31 ENCOUNTER — Ambulatory Visit: Admitting: Family Medicine

## 2024-09-07 ENCOUNTER — Ambulatory Visit: Admitting: Gastroenterology
